# Patient Record
Sex: Female | Born: 1955
Health system: Southern US, Community
[De-identification: ages and names within clinical notes are randomized; demographics above are authoritative.]

## PROBLEM LIST (undated history)

## (undated) DIAGNOSIS — E669 Obesity, unspecified: Secondary | ICD-10-CM

## (undated) DIAGNOSIS — F32A Depression, unspecified: Secondary | ICD-10-CM

## (undated) DIAGNOSIS — F410 Panic disorder [episodic paroxysmal anxiety] without agoraphobia: Secondary | ICD-10-CM

## (undated) DIAGNOSIS — IMO0001 Reserved for inherently not codable concepts without codable children: Secondary | ICD-10-CM

## (undated) DIAGNOSIS — E119 Type 2 diabetes mellitus without complications: Secondary | ICD-10-CM

## (undated) DIAGNOSIS — F329 Major depressive disorder, single episode, unspecified: Secondary | ICD-10-CM

## (undated) DIAGNOSIS — F419 Anxiety disorder, unspecified: Secondary | ICD-10-CM

## (undated) DIAGNOSIS — K2970 Gastritis, unspecified, without bleeding: Secondary | ICD-10-CM

## (undated) DIAGNOSIS — I10 Essential (primary) hypertension: Secondary | ICD-10-CM

## (undated) DIAGNOSIS — N200 Calculus of kidney: Secondary | ICD-10-CM

## (undated) DIAGNOSIS — L739 Follicular disorder, unspecified: Secondary | ICD-10-CM

## (undated) DIAGNOSIS — K219 Gastro-esophageal reflux disease without esophagitis: Secondary | ICD-10-CM

## (undated) DIAGNOSIS — E782 Mixed hyperlipidemia: Secondary | ICD-10-CM

## (undated) DIAGNOSIS — K589 Irritable bowel syndrome without diarrhea: Secondary | ICD-10-CM

## (undated) DIAGNOSIS — N3946 Mixed incontinence: Secondary | ICD-10-CM

## (undated) DIAGNOSIS — B9681 Helicobacter pylori [H. pylori] as the cause of diseases classified elsewhere: Secondary | ICD-10-CM

## (undated) DIAGNOSIS — K635 Polyp of colon: Secondary | ICD-10-CM

## (undated) DIAGNOSIS — E039 Hypothyroidism, unspecified: Secondary | ICD-10-CM

## (undated) HISTORY — DX: Panic disorder (episodic paroxysmal anxiety): F41.0

## (undated) HISTORY — DX: Polyp of colon: K63.5

## (undated) HISTORY — DX: Mixed hyperlipidemia: E78.2

## (undated) HISTORY — DX: Essential (primary) hypertension: I10

## (undated) HISTORY — DX: Depression, unspecified: F32.A

## (undated) HISTORY — PX: UPPER GASTROINTESTINAL ENDOSCOPY: SHX188

## (undated) HISTORY — DX: Obesity, unspecified: E66.9

## (undated) HISTORY — DX: Mixed incontinence: N39.46

## (undated) HISTORY — DX: Reserved for inherently not codable concepts without codable children: IMO0001

## (undated) HISTORY — DX: Gastro-esophageal reflux disease without esophagitis: K21.9

## (undated) HISTORY — DX: Irritable bowel syndrome, unspecified: K58.9

## (undated) HISTORY — DX: Anxiety disorder, unspecified: F41.9

## (undated) HISTORY — DX: Hypothyroidism, unspecified: E03.9

## (undated) HISTORY — DX: Major depressive disorder, single episode, unspecified: F32.9

## (undated) HISTORY — PX: CHOLECYSTECTOMY: SHX55

## (undated) HISTORY — DX: Helicobacter pylori (H. pylori) as the cause of diseases classified elsewhere: B96.81

## (undated) HISTORY — DX: Calculus of kidney: N20.0

## (undated) HISTORY — DX: Follicular disorder, unspecified: L73.9

## (undated) HISTORY — DX: Gastritis, unspecified, without bleeding: K29.70

---

## 2000-11-03 ENCOUNTER — Encounter: Payer: Self-pay | Admitting: Internal Medicine

## 2000-11-03 ENCOUNTER — Emergency Department (HOSPITAL_COMMUNITY): Admission: EM | Admit: 2000-11-03 | Discharge: 2000-11-03 | Payer: Self-pay | Admitting: Internal Medicine

## 2001-01-30 ENCOUNTER — Emergency Department (HOSPITAL_COMMUNITY): Admission: EM | Admit: 2001-01-30 | Discharge: 2001-01-30 | Payer: Self-pay | Admitting: *Deleted

## 2001-06-04 ENCOUNTER — Ambulatory Visit (HOSPITAL_COMMUNITY): Admission: RE | Admit: 2001-06-04 | Discharge: 2001-06-04 | Payer: Self-pay | Admitting: Family Medicine

## 2001-06-04 ENCOUNTER — Encounter: Payer: Self-pay | Admitting: Family Medicine

## 2001-06-17 ENCOUNTER — Encounter: Payer: Self-pay | Admitting: Obstetrics and Gynecology

## 2001-06-17 ENCOUNTER — Ambulatory Visit (HOSPITAL_COMMUNITY): Admission: RE | Admit: 2001-06-17 | Discharge: 2001-06-17 | Payer: Self-pay | Admitting: Obstetrics and Gynecology

## 2001-08-07 ENCOUNTER — Ambulatory Visit (HOSPITAL_COMMUNITY): Admission: RE | Admit: 2001-08-07 | Discharge: 2001-08-07 | Payer: Self-pay | Admitting: Neurosurgery

## 2001-08-07 ENCOUNTER — Encounter: Payer: Self-pay | Admitting: Neurosurgery

## 2001-09-25 ENCOUNTER — Encounter: Payer: Self-pay | Admitting: *Deleted

## 2001-09-25 ENCOUNTER — Emergency Department (HOSPITAL_COMMUNITY): Admission: EM | Admit: 2001-09-25 | Discharge: 2001-09-25 | Payer: Self-pay | Admitting: *Deleted

## 2001-10-17 ENCOUNTER — Ambulatory Visit (HOSPITAL_COMMUNITY): Admission: RE | Admit: 2001-10-17 | Discharge: 2001-10-17 | Payer: Self-pay | Admitting: Cardiology

## 2001-11-28 ENCOUNTER — Emergency Department (HOSPITAL_COMMUNITY): Admission: EM | Admit: 2001-11-28 | Discharge: 2001-11-28 | Payer: Self-pay | Admitting: Emergency Medicine

## 2001-11-28 ENCOUNTER — Encounter: Payer: Self-pay | Admitting: Emergency Medicine

## 2001-12-22 ENCOUNTER — Ambulatory Visit (HOSPITAL_COMMUNITY): Admission: RE | Admit: 2001-12-22 | Discharge: 2001-12-22 | Payer: Self-pay | Admitting: Obstetrics and Gynecology

## 2001-12-22 ENCOUNTER — Encounter: Payer: Self-pay | Admitting: Obstetrics and Gynecology

## 2002-04-05 ENCOUNTER — Emergency Department (HOSPITAL_COMMUNITY): Admission: EM | Admit: 2002-04-05 | Discharge: 2002-04-05 | Payer: Self-pay | Admitting: Emergency Medicine

## 2002-04-05 ENCOUNTER — Encounter: Payer: Self-pay | Admitting: Emergency Medicine

## 2002-05-13 ENCOUNTER — Encounter: Payer: Self-pay | Admitting: Family Medicine

## 2002-05-13 ENCOUNTER — Ambulatory Visit (HOSPITAL_COMMUNITY): Admission: RE | Admit: 2002-05-13 | Discharge: 2002-05-13 | Payer: Self-pay | Admitting: Family Medicine

## 2002-07-06 ENCOUNTER — Encounter: Payer: Self-pay | Admitting: Family Medicine

## 2002-07-06 ENCOUNTER — Ambulatory Visit (HOSPITAL_COMMUNITY): Admission: RE | Admit: 2002-07-06 | Discharge: 2002-07-06 | Payer: Self-pay | Admitting: Family Medicine

## 2002-11-26 ENCOUNTER — Ambulatory Visit (HOSPITAL_COMMUNITY): Admission: RE | Admit: 2002-11-26 | Discharge: 2002-11-26 | Payer: Self-pay | Admitting: Family Medicine

## 2002-11-26 ENCOUNTER — Encounter: Payer: Self-pay | Admitting: Family Medicine

## 2002-12-01 ENCOUNTER — Emergency Department (HOSPITAL_COMMUNITY): Admission: EM | Admit: 2002-12-01 | Discharge: 2002-12-01 | Payer: Self-pay | Admitting: *Deleted

## 2002-12-01 ENCOUNTER — Encounter: Payer: Self-pay | Admitting: *Deleted

## 2003-02-10 ENCOUNTER — Emergency Department (HOSPITAL_COMMUNITY): Admission: EM | Admit: 2003-02-10 | Discharge: 2003-02-10 | Payer: Self-pay | Admitting: Emergency Medicine

## 2003-02-11 ENCOUNTER — Ambulatory Visit (HOSPITAL_COMMUNITY): Admission: RE | Admit: 2003-02-11 | Discharge: 2003-02-11 | Payer: Self-pay | Admitting: Internal Medicine

## 2003-02-11 IMAGING — US US EXTREM LOW VENOUS*L*
1 series · 14 of 24 positions shown · non-contrast
Comparison: none

CLINICAL DATA: Pain for one week.
 LEFT LOWER EXTREMITY ULTRASOUND VENOUS IMAGING
 The deep venous system is patent and compressible from the groin through popliteal fossa.  Spontaneous venous flow is present with intact augmentation.  No intraluminal thrombus is seen.  
 IMPRESSION
 No evidence of deep venous thrombosis.

[Series 1: unknown · 14 of 25 slices shown]
[im 1/25]
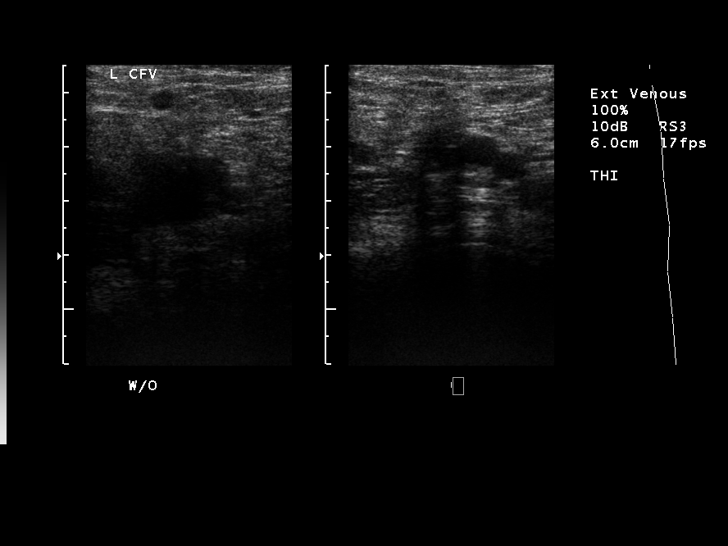
[im 3/25]
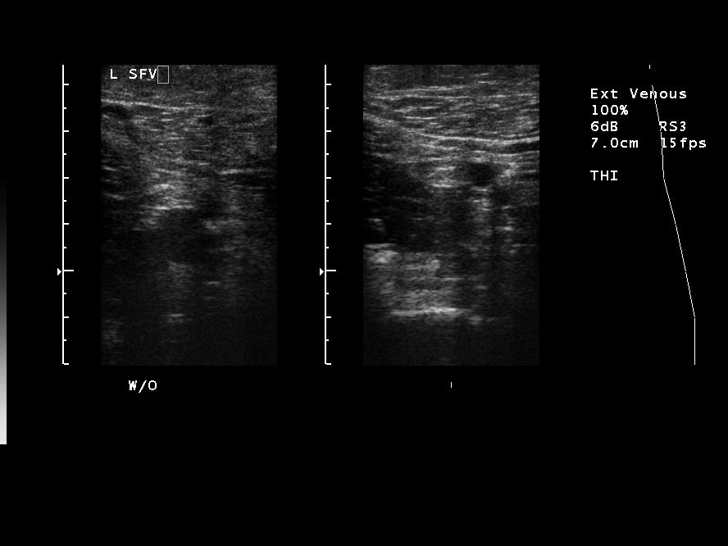
[im 5/25]
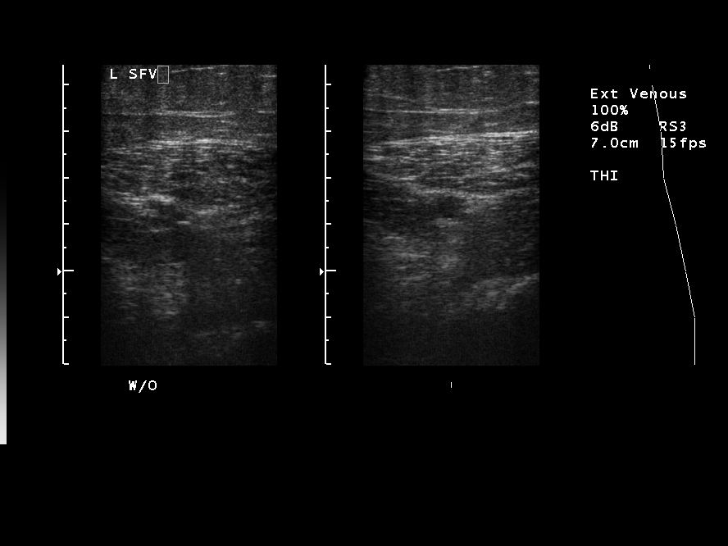
[im 7/25]
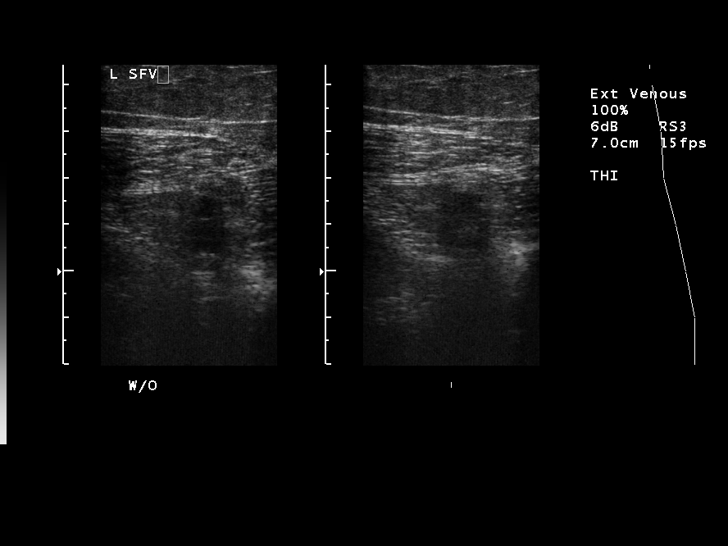
[im 8/25]
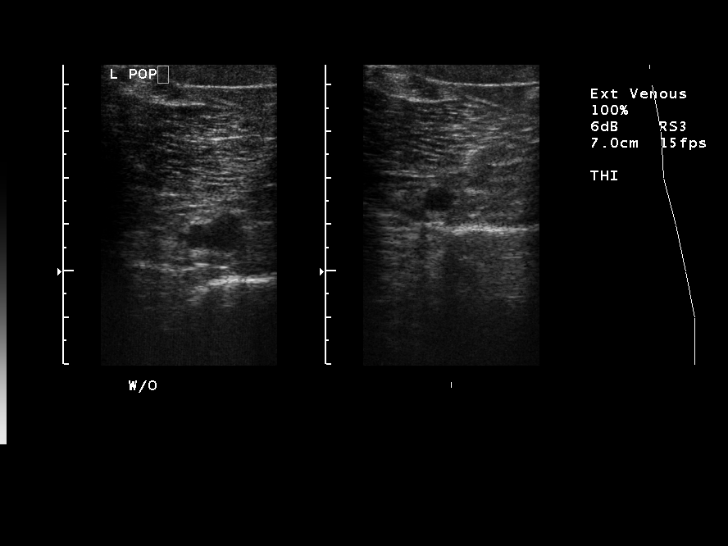
[im 10/25]
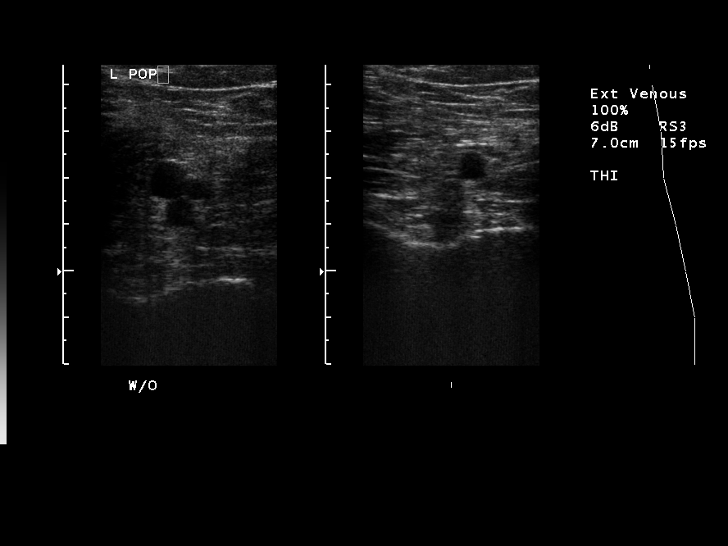
[im 12/25]
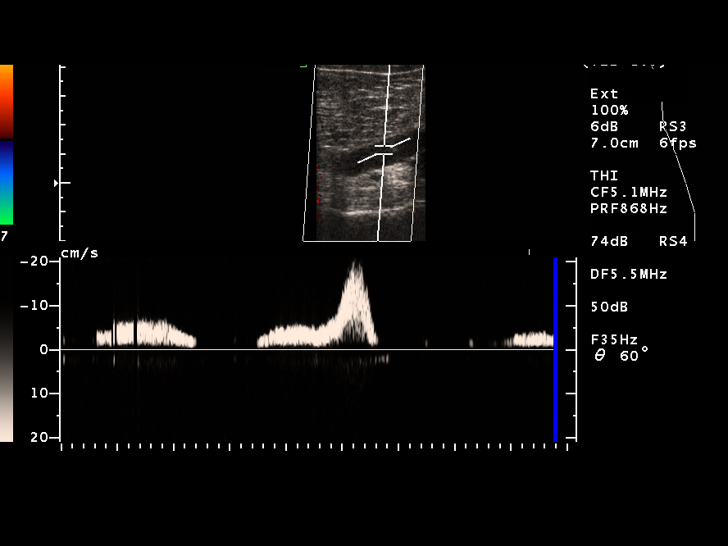
[im 13/25]
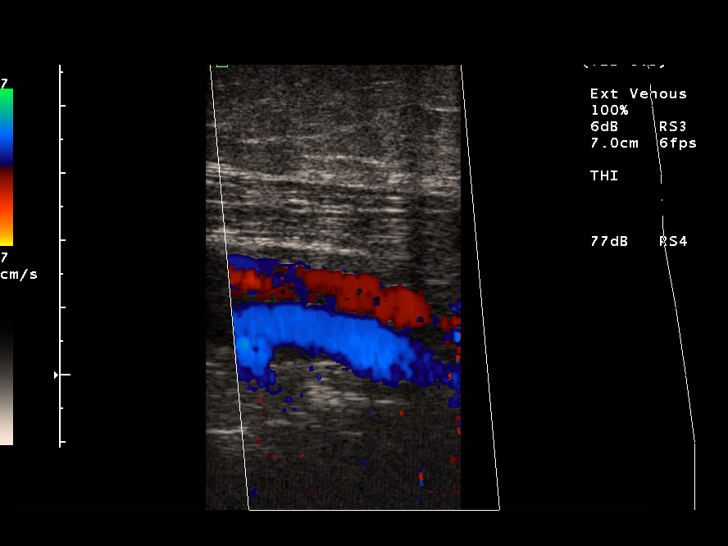
[im 15/25]
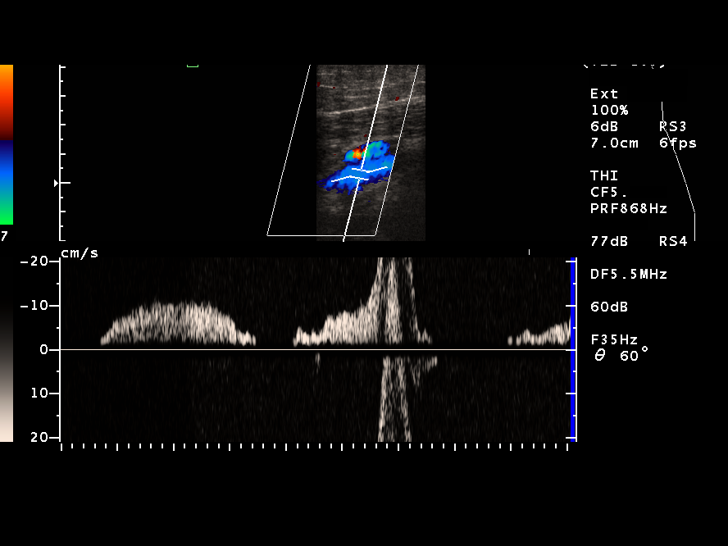
[im 17/25]
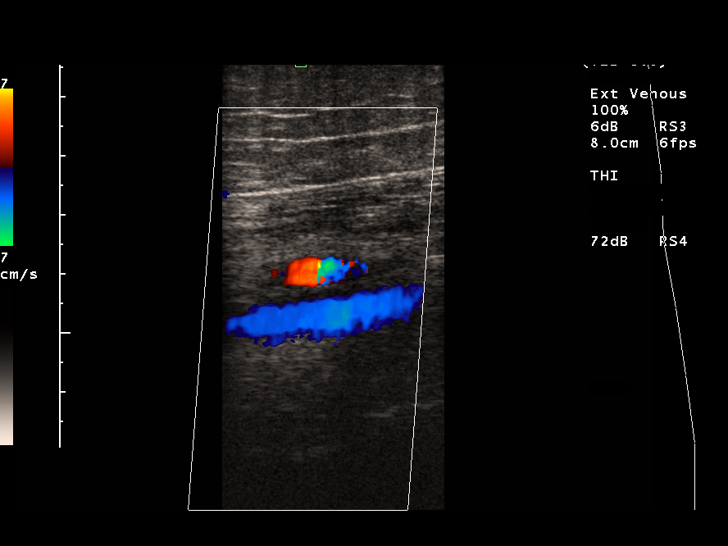
[im 19/25]
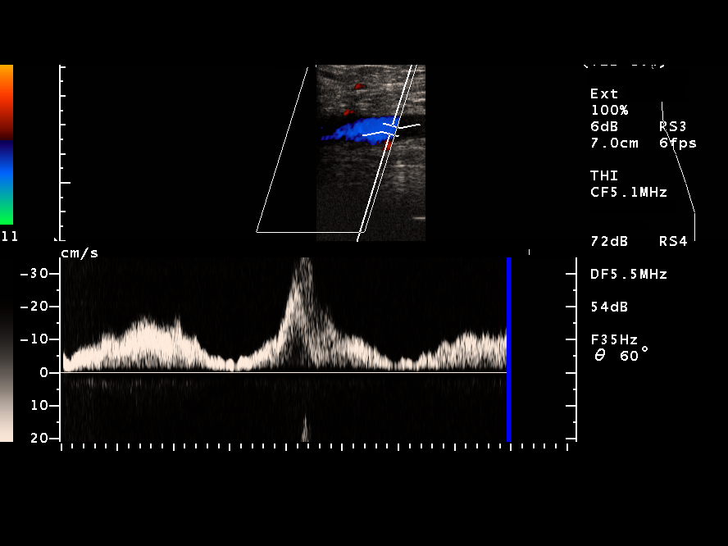
[im 20/25]
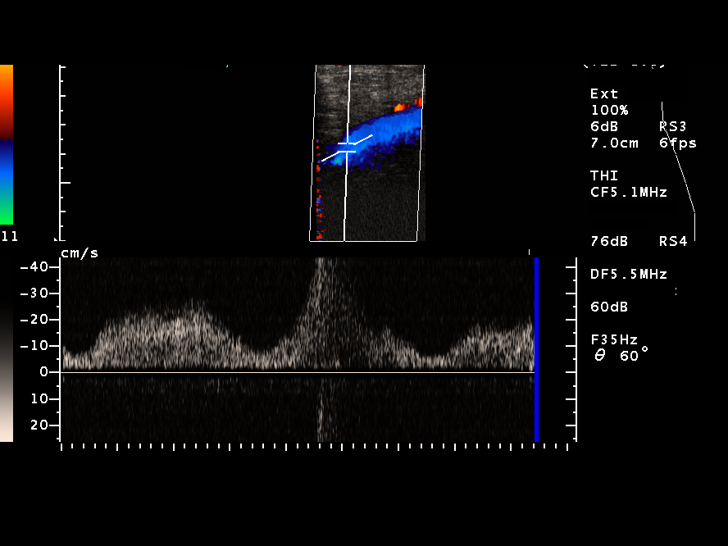
[im 22/25]
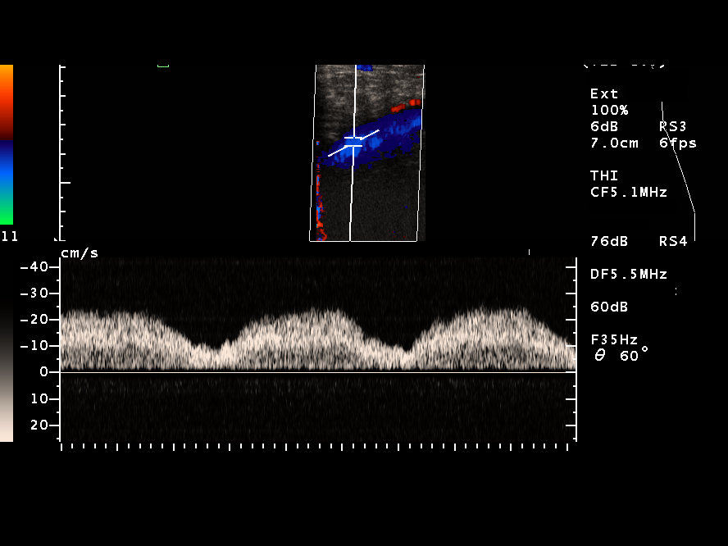
[im 25/25]
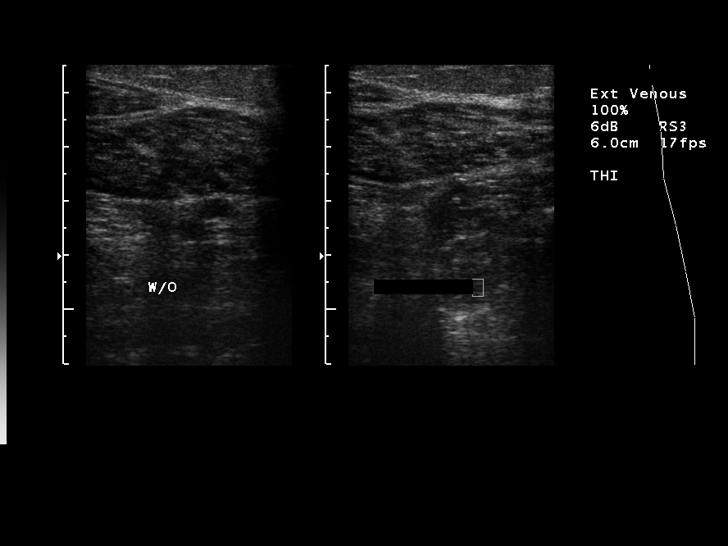

[14 of 24 positions shown; findings below may reference images not displayed]

## 2003-05-11 ENCOUNTER — Emergency Department (HOSPITAL_COMMUNITY): Admission: EM | Admit: 2003-05-11 | Discharge: 2003-05-11 | Payer: Self-pay | Admitting: Emergency Medicine

## 2003-05-20 ENCOUNTER — Ambulatory Visit (HOSPITAL_COMMUNITY): Admission: RE | Admit: 2003-05-20 | Discharge: 2003-05-20 | Payer: Self-pay | Admitting: Otolaryngology

## 2003-05-20 IMAGING — CT CT PARANASAL SINUSES LIMITED
1 series · 6 of 8 positions shown, 8 images · non-contrast
Comparison: none

CLINICAL DATA: Chronic sinusitis.
 CT SINUS LTD W/O CONTRAST
 Supine axial images were made through the paranasal sinuses.  There is no mucosal thickening or air-fluid level.  There is very slight nasal septal deviation to the right.
 IMPRESSION
 Normal paranasal sinuses.

[Series 8297: — · axial · 0.37mm/px · z∈[-672,-622]mm · 6 of 8 slices shown, 8 images]
[im 2/8  brain]
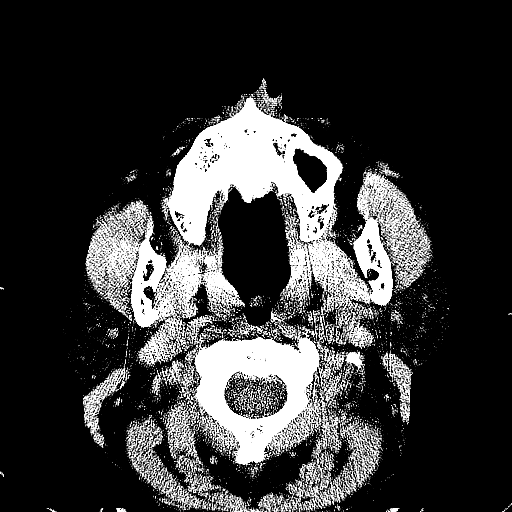
[im 2/8  bone]
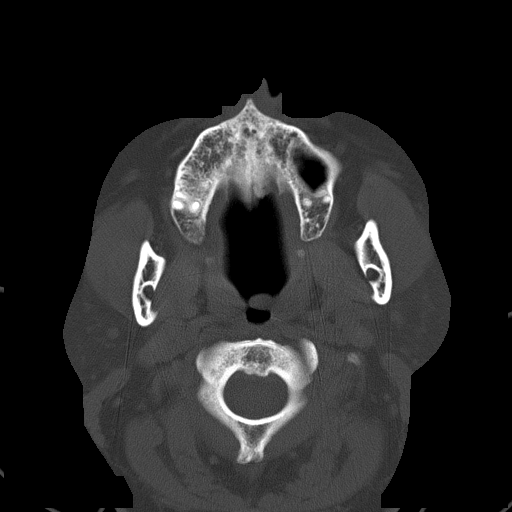
[im 3/8  bone]
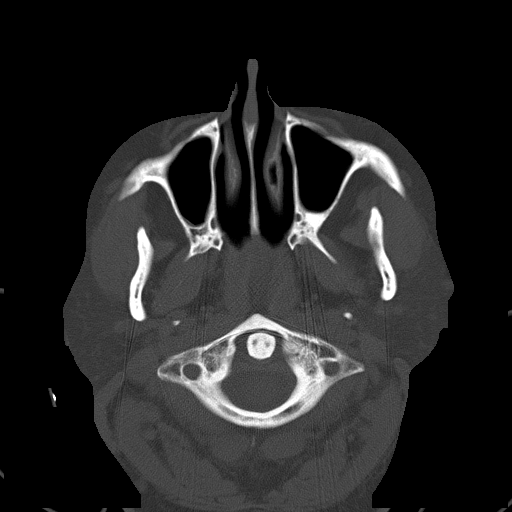
[im 4/8  bone]
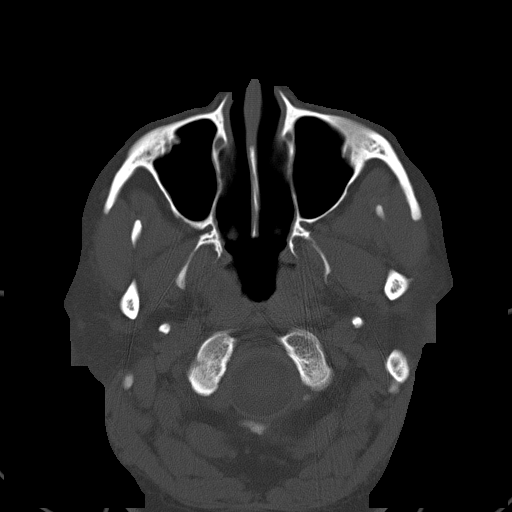
[im 5/8  bone]
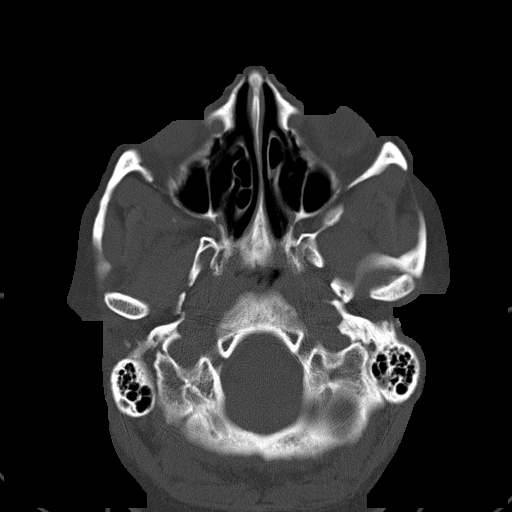
[im 6/8  brain]
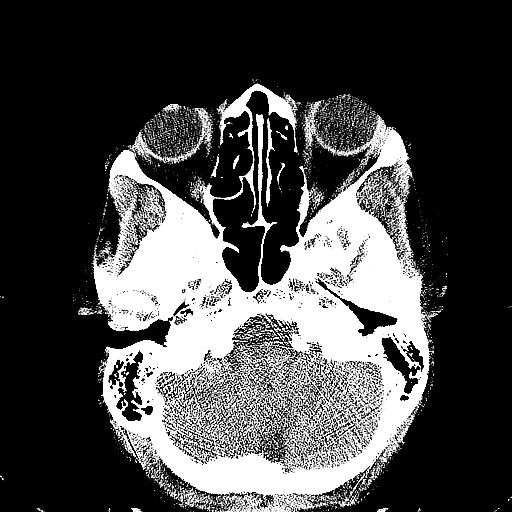
[im 6/8  bone]
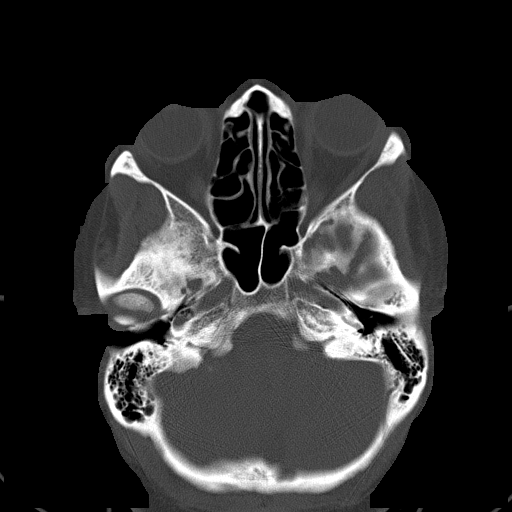
[im 7/8  bone]
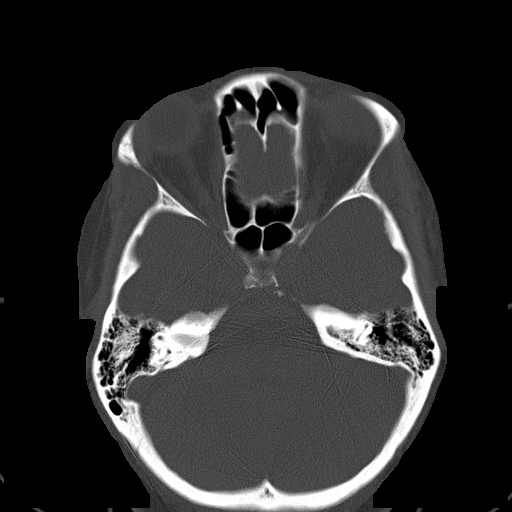

[6 of 8 positions shown; findings below may reference images not displayed]

## 2003-07-29 ENCOUNTER — Ambulatory Visit (HOSPITAL_COMMUNITY): Admission: RE | Admit: 2003-07-29 | Discharge: 2003-07-29 | Payer: Self-pay | Admitting: Obstetrics and Gynecology

## 2004-02-05 ENCOUNTER — Ambulatory Visit (HOSPITAL_COMMUNITY): Admission: RE | Admit: 2004-02-05 | Discharge: 2004-02-05 | Payer: Self-pay | Admitting: Family Medicine

## 2004-05-13 ENCOUNTER — Emergency Department (HOSPITAL_COMMUNITY): Admission: EM | Admit: 2004-05-13 | Discharge: 2004-05-13 | Payer: Self-pay | Admitting: *Deleted

## 2004-05-13 IMAGING — CR DG CHEST 2V
2 series · 2 of 2 positions shown · non-contrast
Comparison: none

CLINICAL DATA: Cough, congestion, and dyspnea.
 2-VIEW CHEST: 
 Peribronchial thickening is noted with no focal air space disease.  No pleural effusion or pneumothorax.  Upper limits normal heart size is noted. The bony thorax and upper abdomen are unremarkable except for evidence of cholecystectomy.

[view not recorded (1 of 2)]
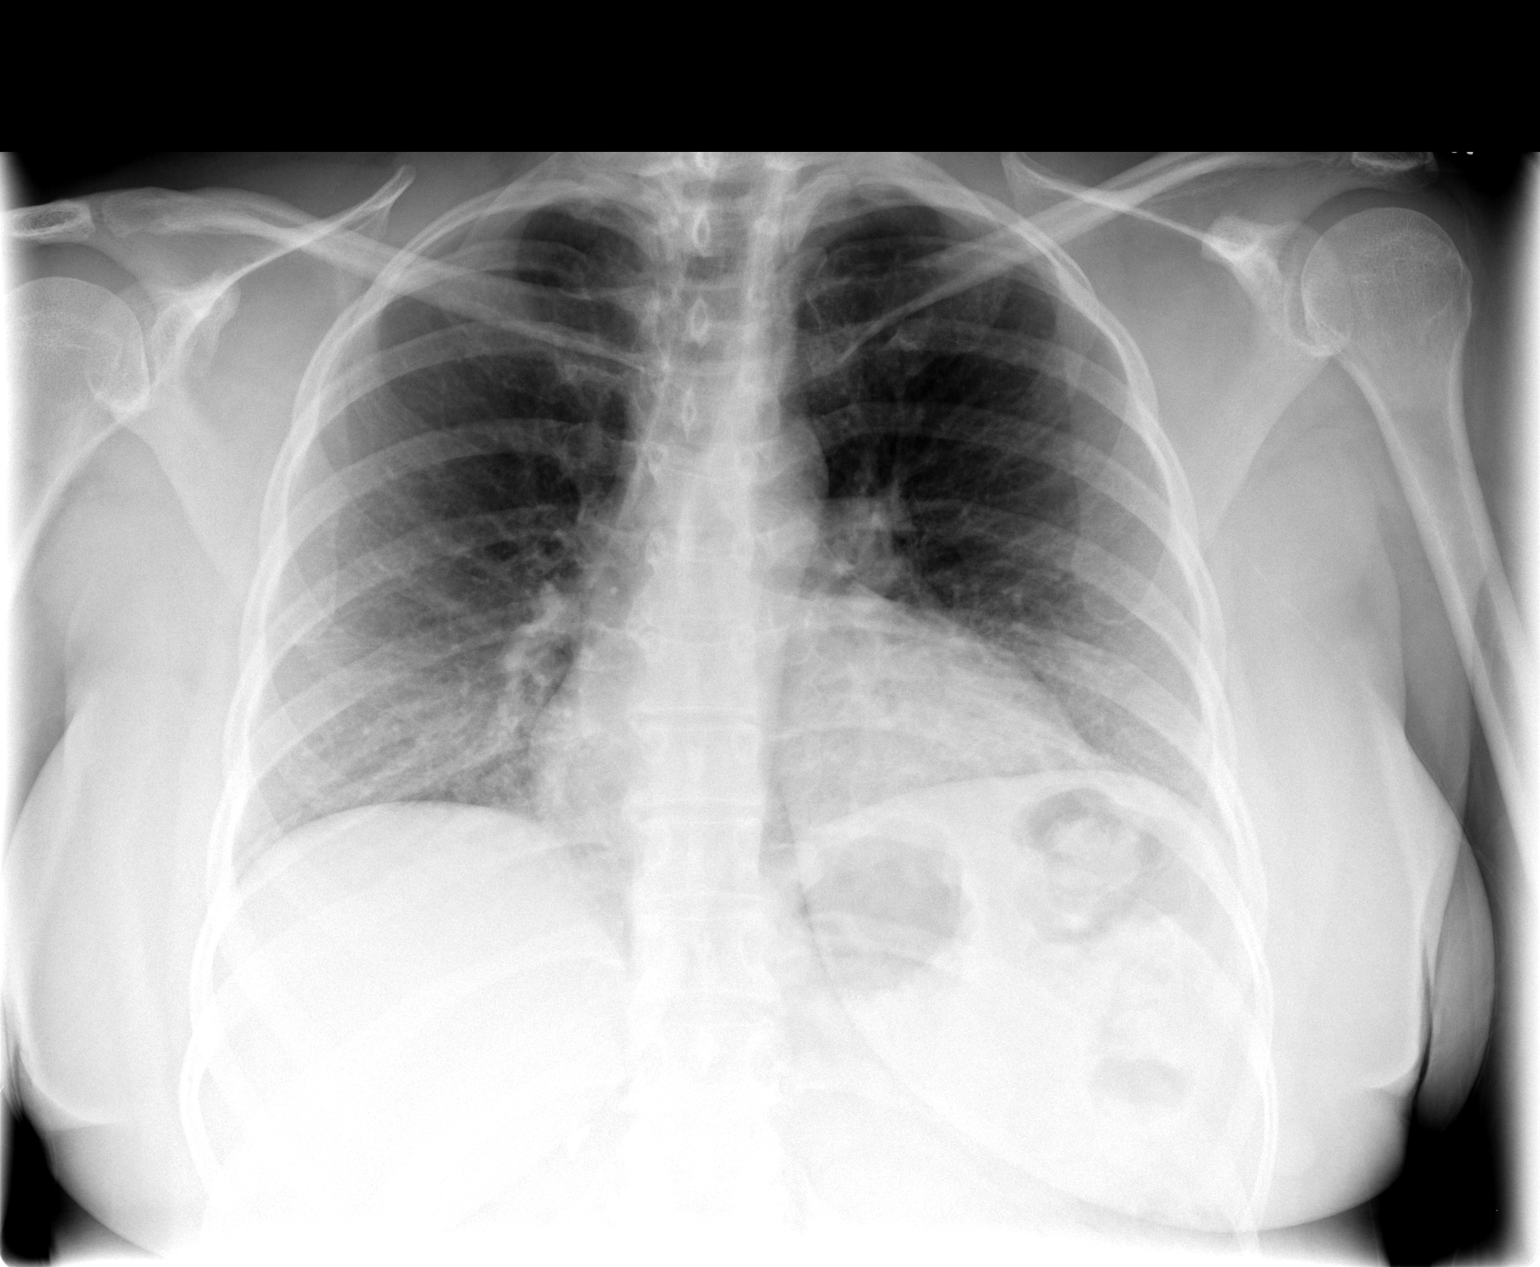

[view not recorded (2 of 2)]
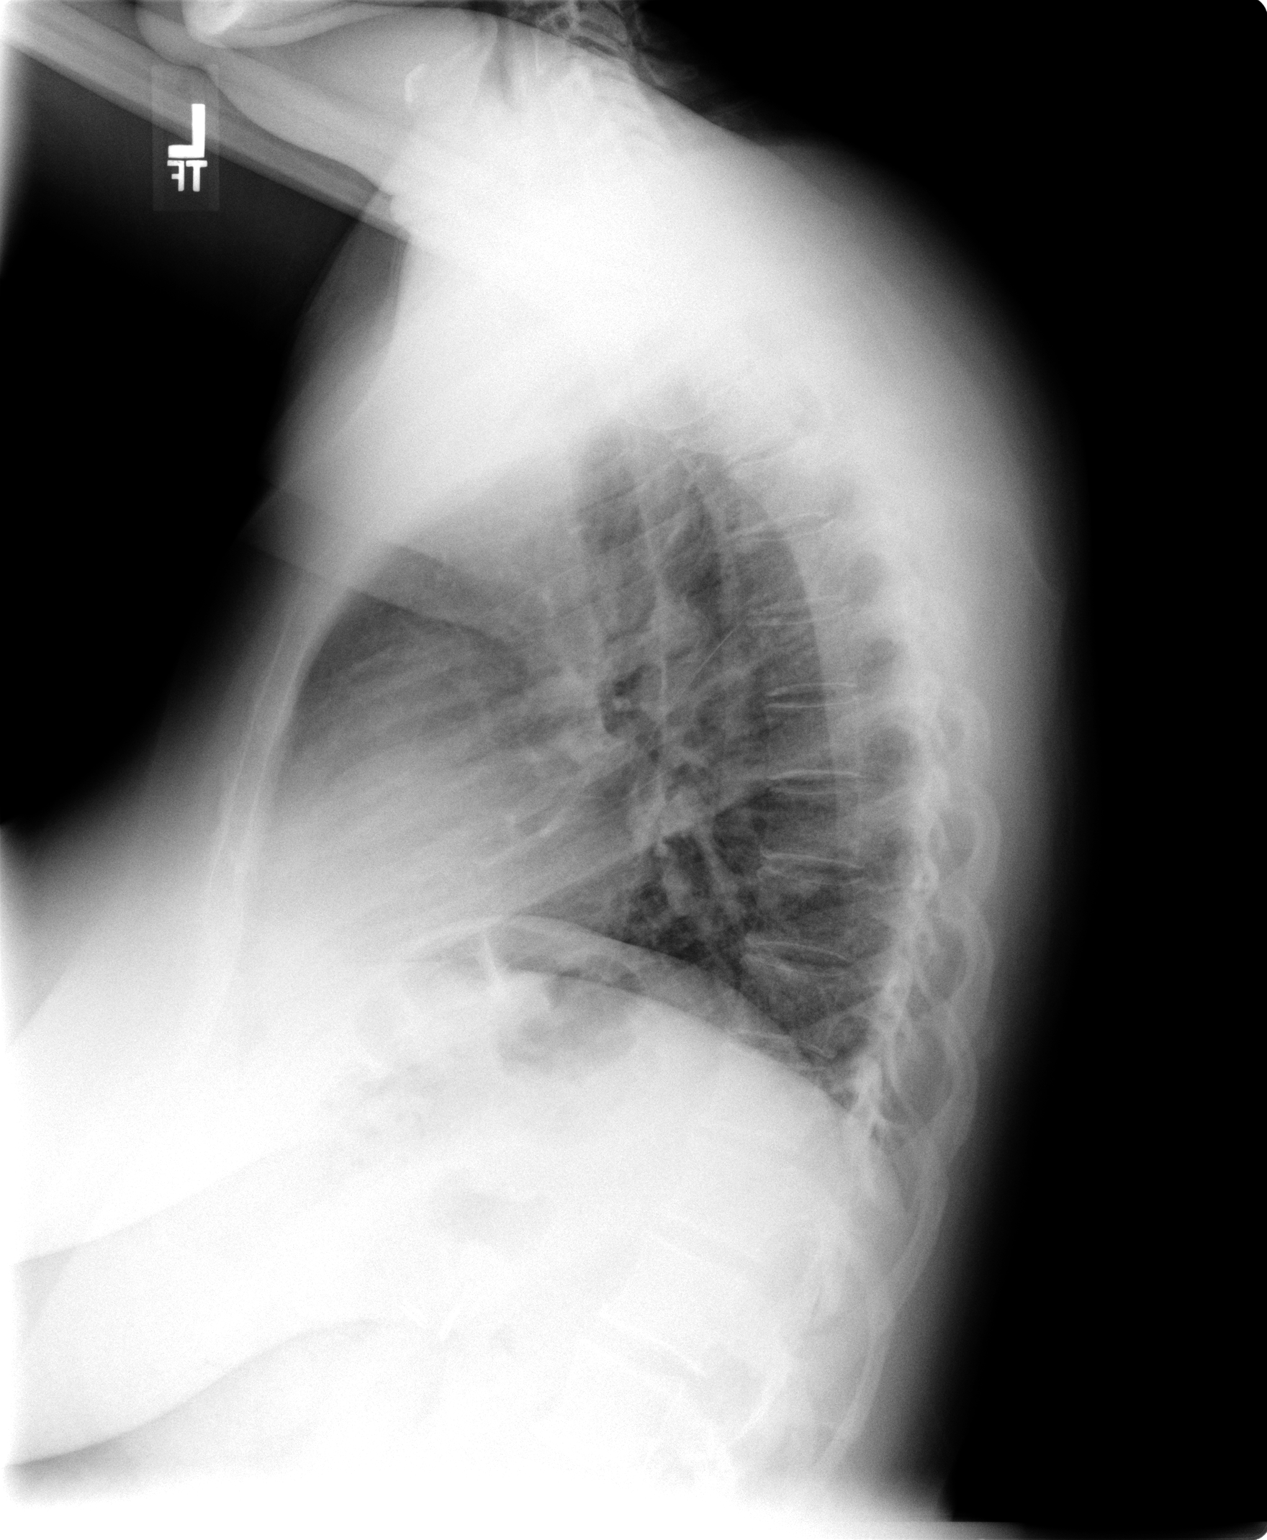

[2 of 2 positions shown; findings below may reference images not displayed]

IMPRESSION: Peribronchial thickening without focal air space disease.

## 2004-09-15 ENCOUNTER — Ambulatory Visit (HOSPITAL_COMMUNITY): Admission: RE | Admit: 2004-09-15 | Discharge: 2004-09-15 | Payer: Self-pay | Admitting: Obstetrics and Gynecology

## 2005-03-13 ENCOUNTER — Emergency Department (HOSPITAL_COMMUNITY): Admission: EM | Admit: 2005-03-13 | Discharge: 2005-03-13 | Payer: Self-pay | Admitting: Emergency Medicine

## 2005-03-13 IMAGING — CT CT ABDOMEN W/ CM
1 series · 14 of 32 positions shown, 18 images · IV contrast (omnipaque)
Comparison: none

CLINICAL DATA: Nausea and left lower quadrant pain for three weeks.  History of previous cholecystectomy.
 ABDOMEN CT WITH CONTRAST ? [DATE]:
TECHNIQUE: Multidetector CT imaging of the abdomen was performed following the standard protocol during bolus administration of intravenous contrast. 
 Contrast:  100 ml Omnipaque 300 IV.
TECHNIQUE: Multidetector CT imaging of the pelvis was performed following the standard protocol during bolus administration of intravenous contrast.

[Series 112: — · axial · 0.80mm/px · z∈[+1170,+1414]mm · 14 of 55 slices shown, 18 images]
[im 4/55  soft-tissue]
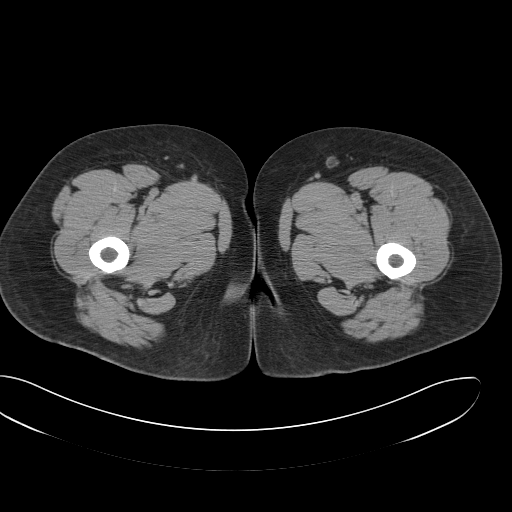
[im 4/55  bone]
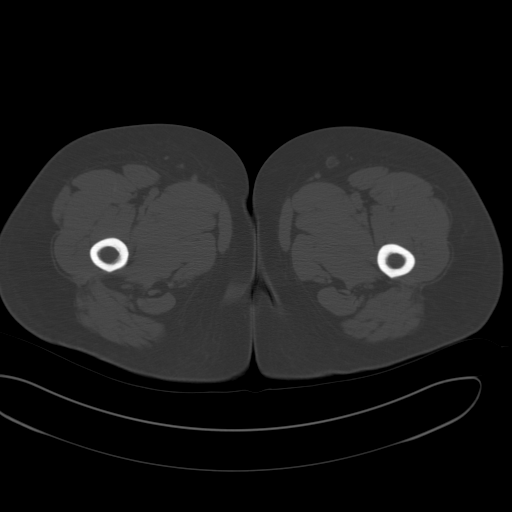
[im 7/55  soft-tissue]
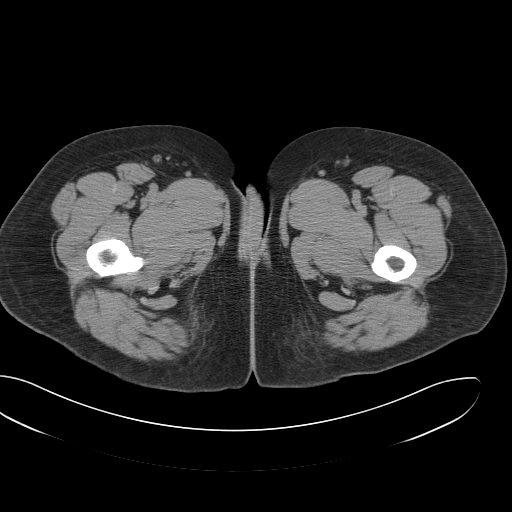
[im 13/55  soft-tissue]
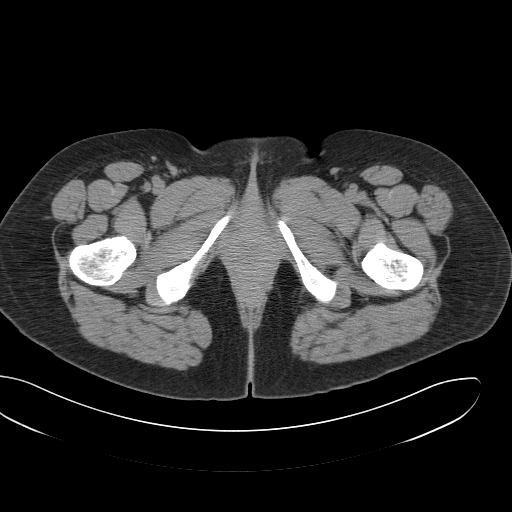
[im 16/55  soft-tissue]
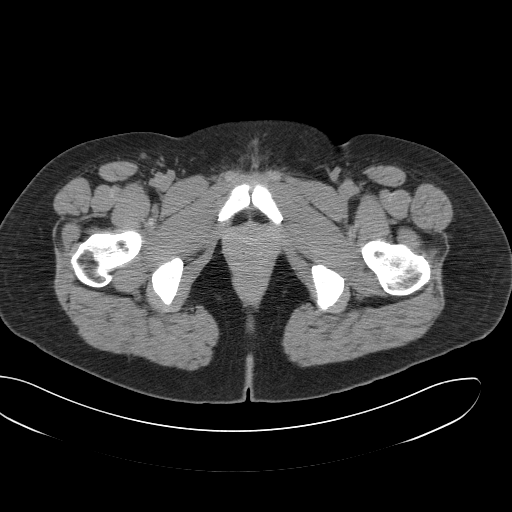
[im 21/55  soft-tissue]
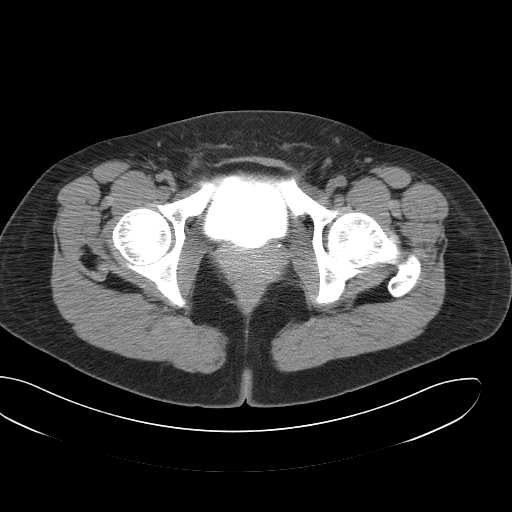
[im 25/55  soft-tissue]
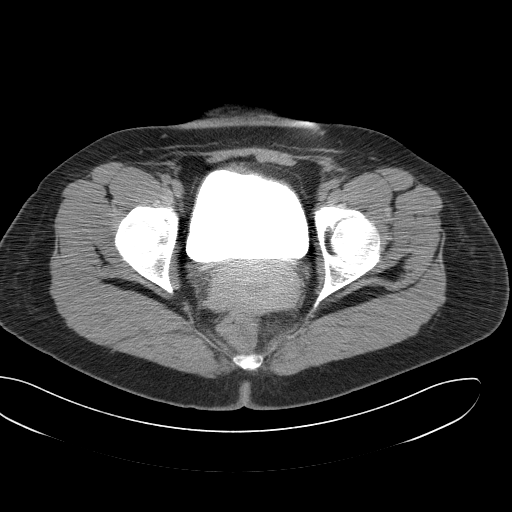
[im 30/55  soft-tissue]
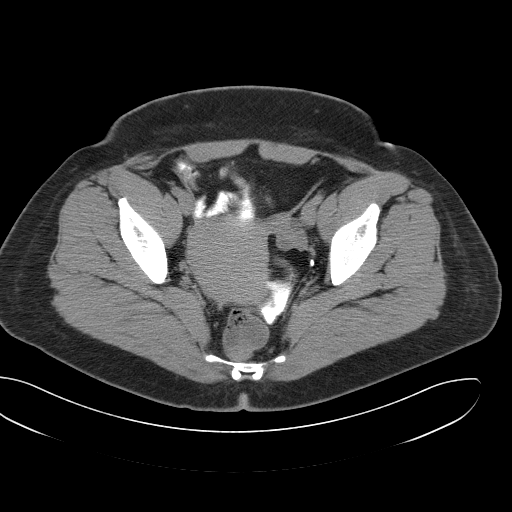
[im 34/55  soft-tissue]
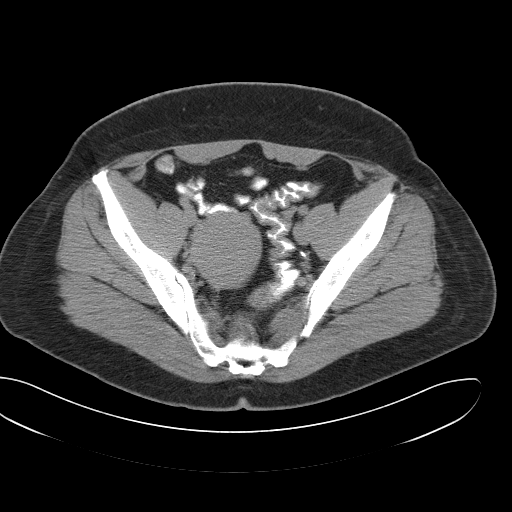
[im 39/55  soft-tissue]
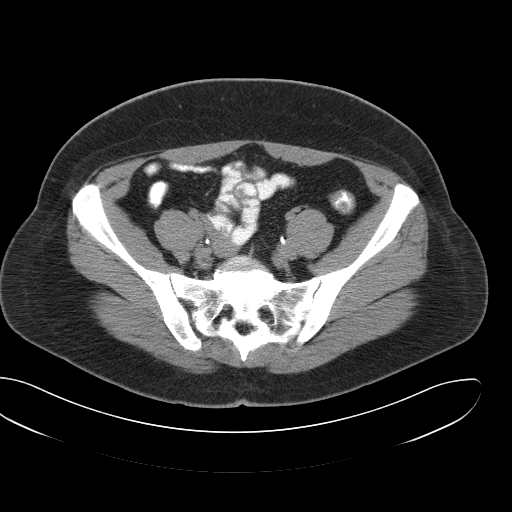
[im 39/55  bone]
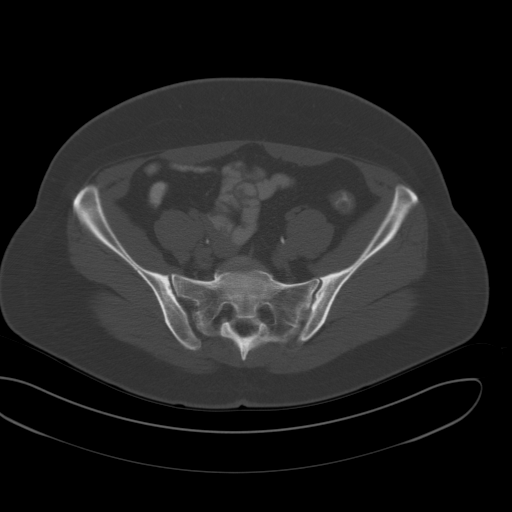
[im 42/55  soft-tissue]
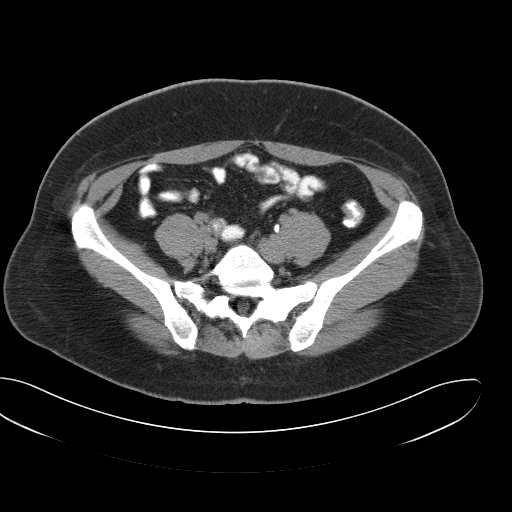
[im 48/55  soft-tissue]
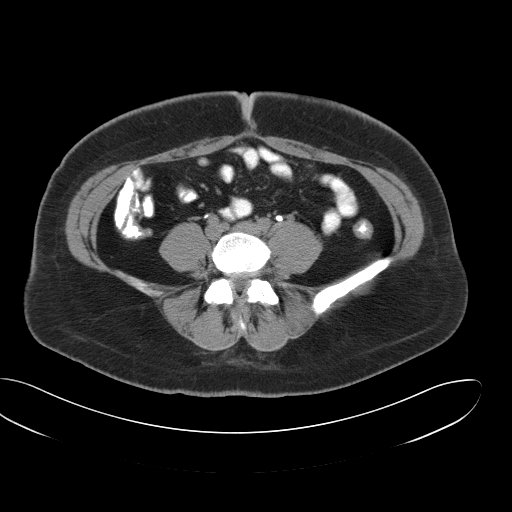
[im 48/55  lung]
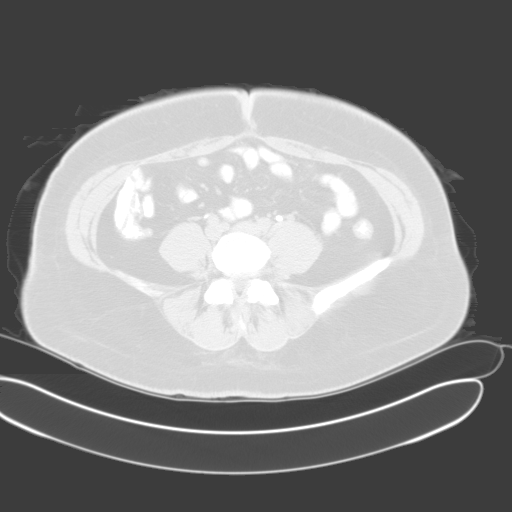
[im 49/55  lung]
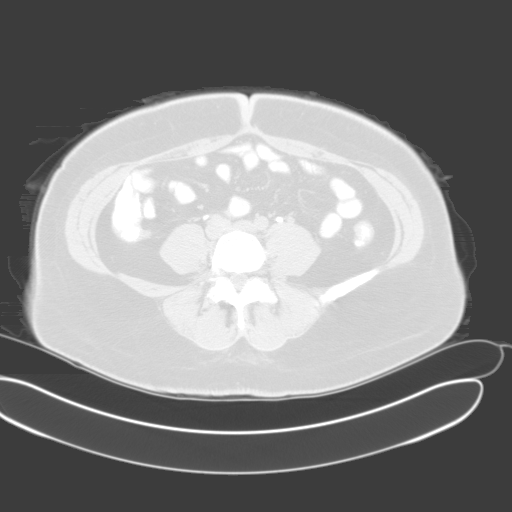
[im 51/55  soft-tissue]
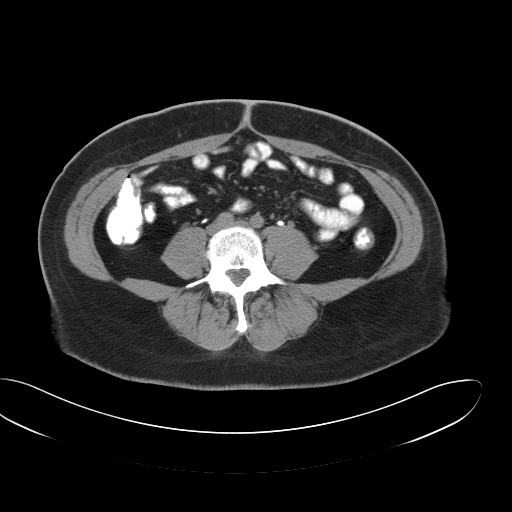
[im 51/55  lung]
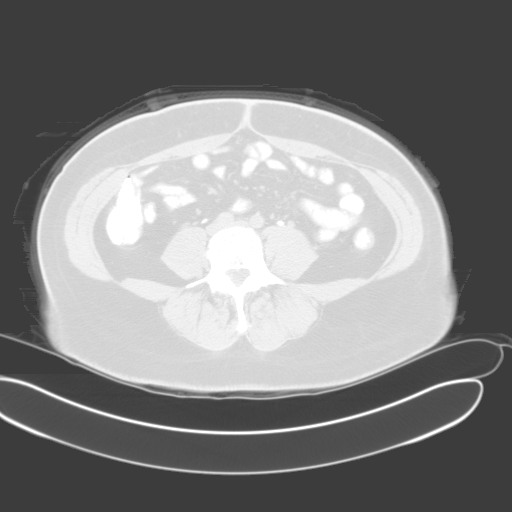
[im 53/55  lung]
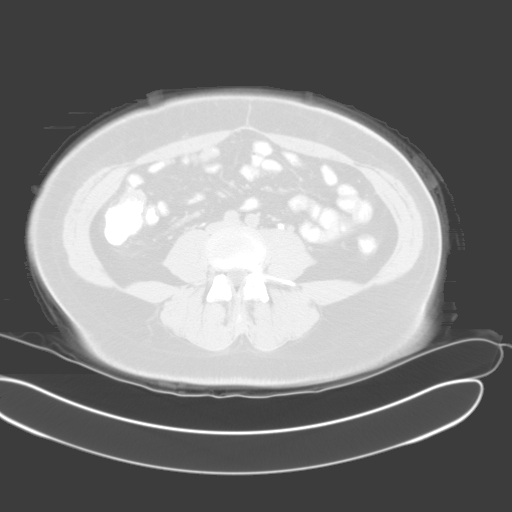

[14 of 32 positions shown; findings below may reference images not displayed]

FINDINGS: After the intravenous injection of 100 milliliters of Omnipaque 300 a series of scans of the abdomen are made without previous films for comparison and shows a moderate sized hiatal hernia.   The lower lung fields are normal.  The gallbladder has been removed.  The liver, hepatic ducts, common bile duct, pancreas, and spleen are normal.  The adrenal glands are normal.  There appears to be a 2 x 3 mm nonobstructing calculus associated with the midpole of the right kidney.  There is no hydronephrosis or hydroureter associated with either kidney.  
 There are two small cysts associated with the upper pole of the right kidney, both of which measure less than 8 mm in diameter.  There are no abnormal lymph nodes.  The oral contrast has passed through the stomach and entire small bowel into the right colon.  The appendix appears normal and there is no evidence of free fluid or inflammation in the abdomen.  The major vessels are normal with minimal calcification of the aorta.  The bones of the lower thoracic and upper lumbar spine are within the limits of normal.
IMPRESSION: Hiatal hernia.  
 Simple cysts of upper pole of the right kidney.  
 Previous cholecystectomy.
 No active disease in the upper abdomen.  
 There is a 2 x 3 mm nonobstructing stone in the lower pole of the right kidney.
 PELVIS CT WITH CONTRAST ? [DATE]:
FINDINGS: CT scan of the pelvis shows no evidence of free fluid or air.  The uterus is slightly prominent and extends slightly to the right of the midline.  The left ovary is visualized and measures 2.5 x 3.4 cm and is within the normal limit.  The ureters show no definite dilatation but there is a 1 x 3 mm calcification seen near the left ureterovesical junction that could be just at the orifice of the ureter into the bladder.  There are also some small pelvic phleboliths present which makes this differentiation difficult.  We will attempt to recall the patient to get delayed films of the pelvis in order to better visualize the relationship of the distal ureter to this calcification which is best seen on image #89/103.  The bones of the pelvis and lower lumbar spine show no significant abnormality.
IMPRESSION: Questionable 1 x 3 mm nonobstructing calculus in the left ureterovesical junction area.

## 2005-03-13 IMAGING — CR DG CHEST 1V PORT
1 series · 1 of 1 positions shown · non-contrast
Comparison: none

CLINICAL DATA: Chest pain and tightness

Portable chest at [C6]:
Comparison [DATE]. Relatively low volumes. No focal infiltrate or overt
edema. Heart size appears mildly enlarged, probably emphasized by technique and
low volumes. No effusion. Visualized bones unremarkable.

[view not recorded]
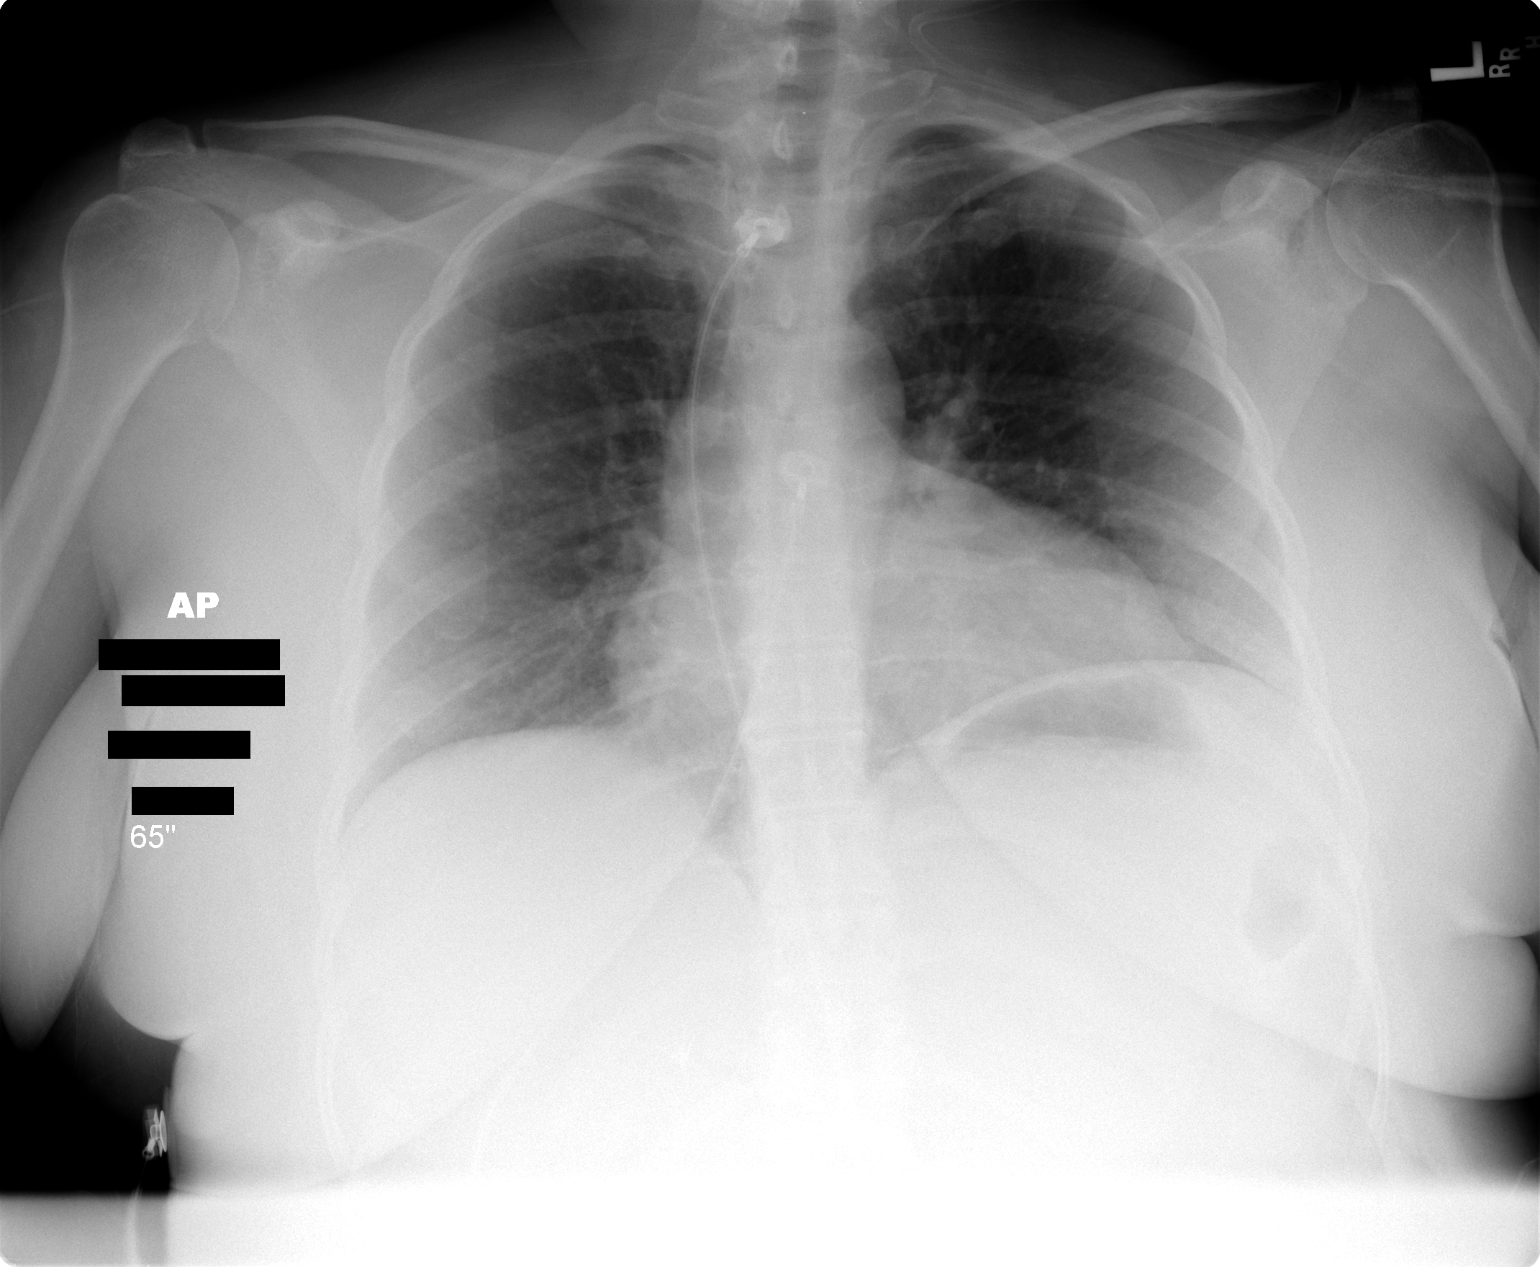

[1 of 1 positions shown; findings below may reference images not displayed]

IMPRESSION: 1. No acute disease

## 2005-03-13 IMAGING — CT CT ABDOMEN W/ CM
1 of 3 series · 13 of 32 positions shown, 18 images · IV contrast (CONTRAST)
Comparison: none

CLINICAL DATA: Nausea and left lower quadrant pain for three weeks.  History of previous cholecystectomy.
 ABDOMEN CT WITH CONTRAST ? [DATE]:
TECHNIQUE: Multidetector CT imaging of the abdomen was performed following the standard protocol during bolus administration of intravenous contrast. 
 Contrast:  100 ml Omnipaque 300 IV.
TECHNIQUE: Multidetector CT imaging of the pelvis was performed following the standard protocol during bolus administration of intravenous contrast.

[Series 102: — · axial · 0.76mm/px · z∈[+1276,+1726]mm · 13 of 103 slices shown, 18 images]
[im 7/103  soft-tissue]
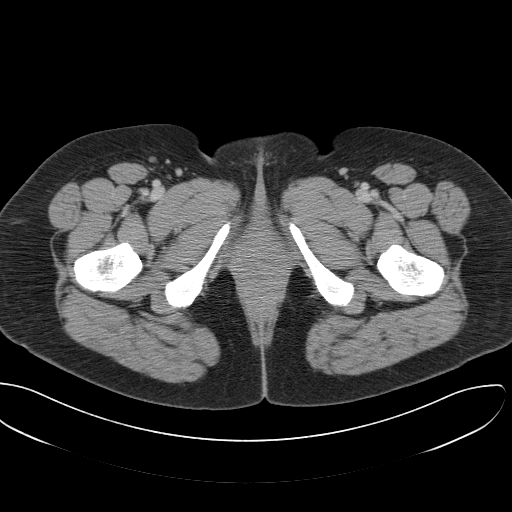
[im 7/103  bone]
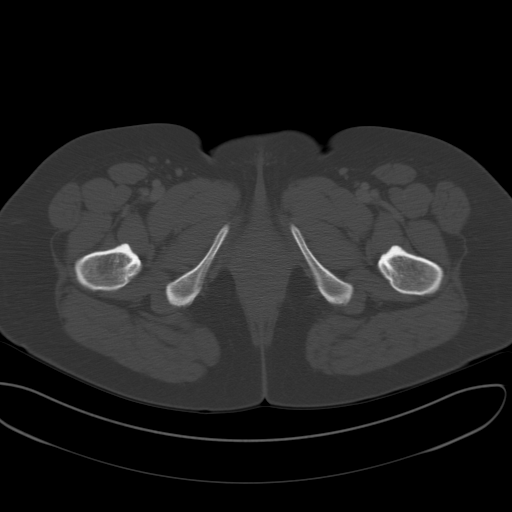
[im 19/103  soft-tissue]
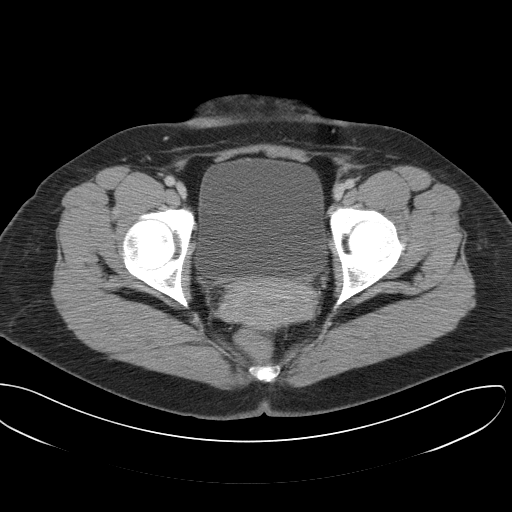
[im 25/103  soft-tissue]
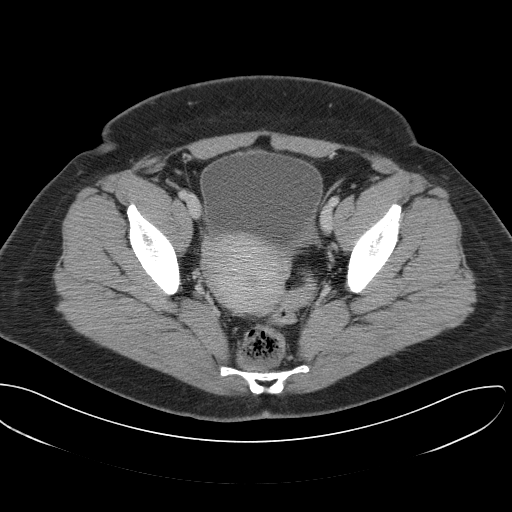
[im 31/103  soft-tissue]
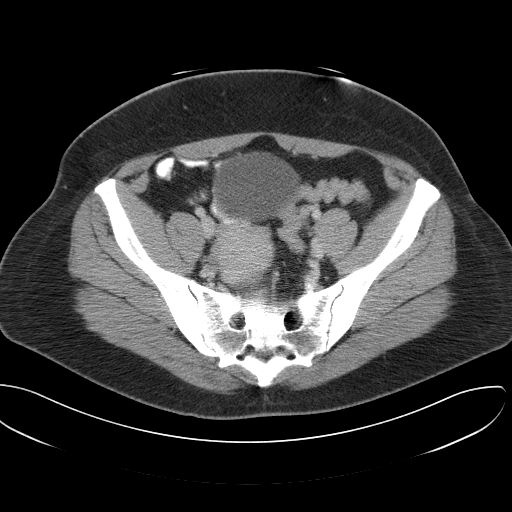
[im 43/103  soft-tissue]
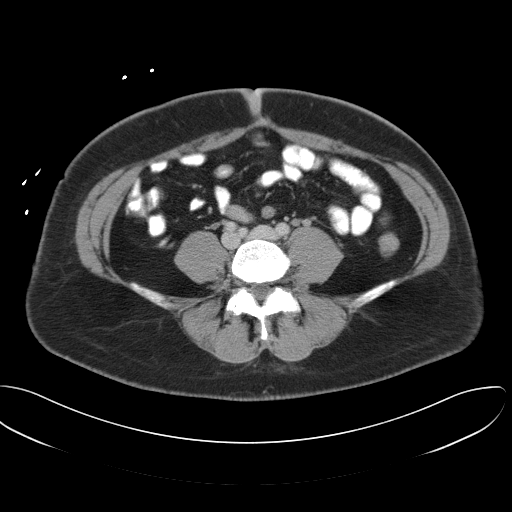
[im 49/103  soft-tissue]
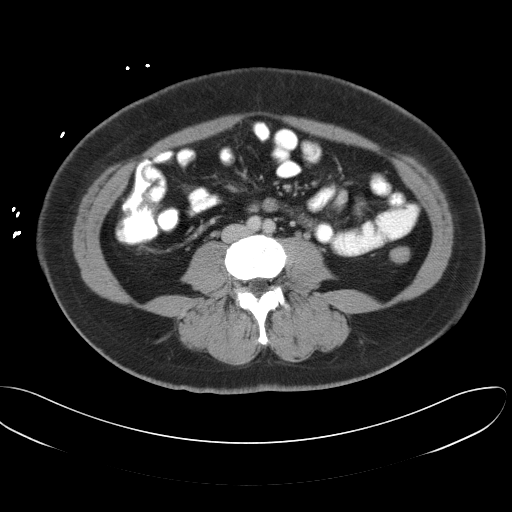
[im 55/103  soft-tissue]
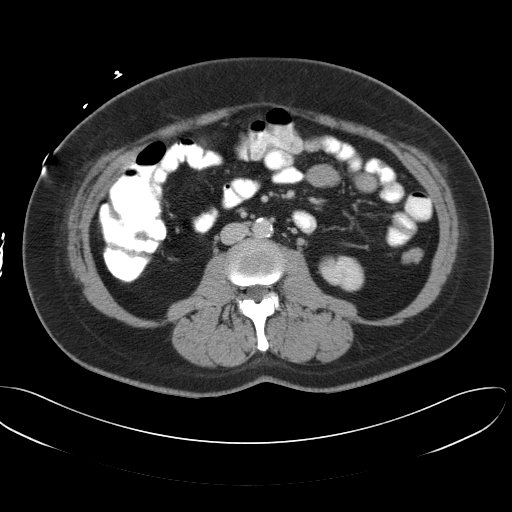
[im 67/103  soft-tissue]
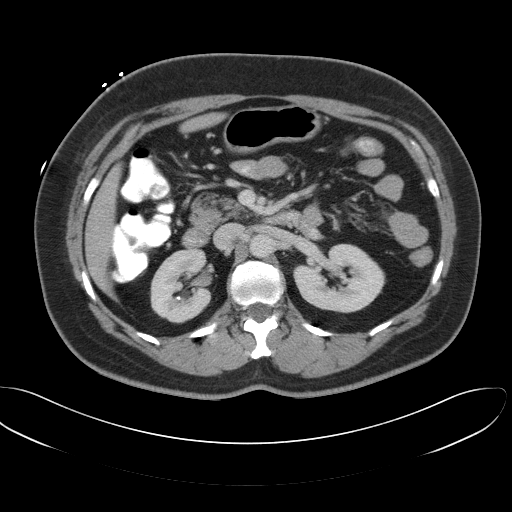
[im 73/103  soft-tissue]
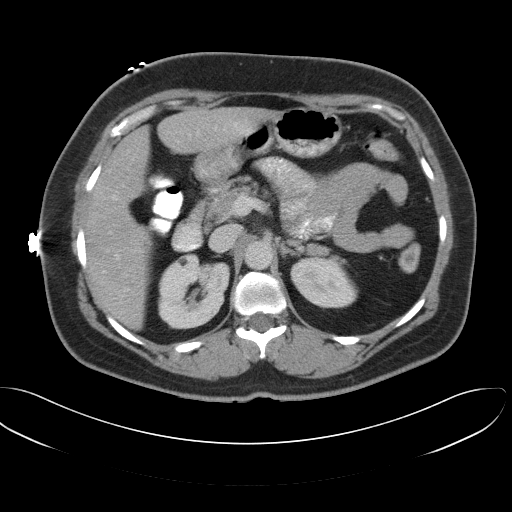
[im 73/103  bone]
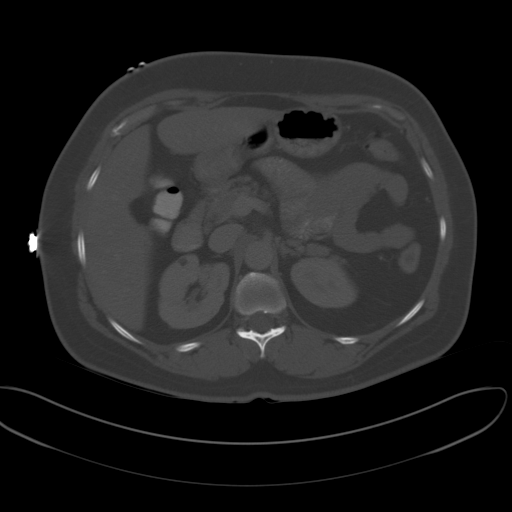
[im 79/103  soft-tissue]
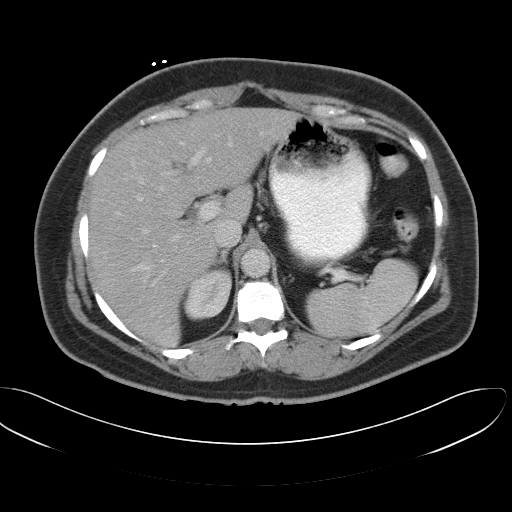
[im 79/103  lung]
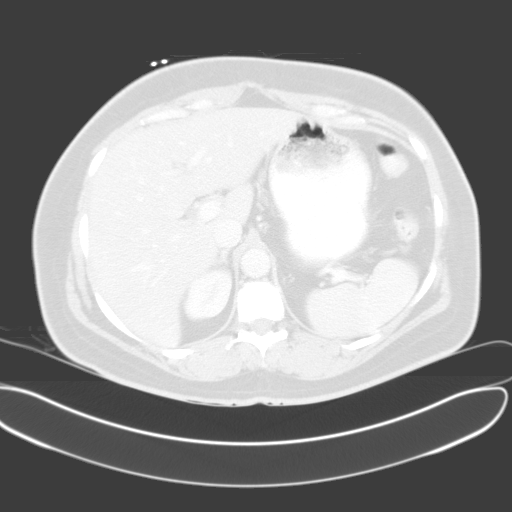
[im 85/103  lung]
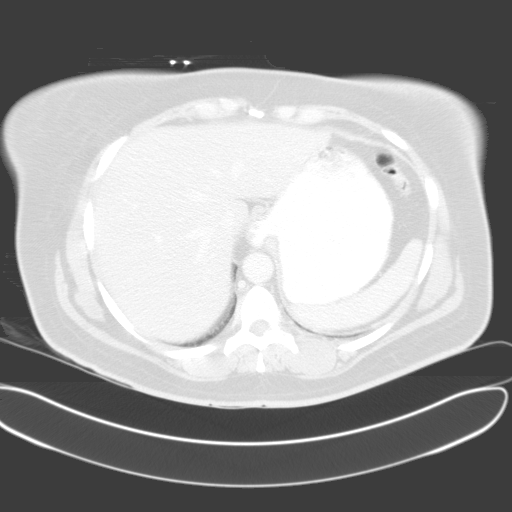
[im 91/103  soft-tissue]
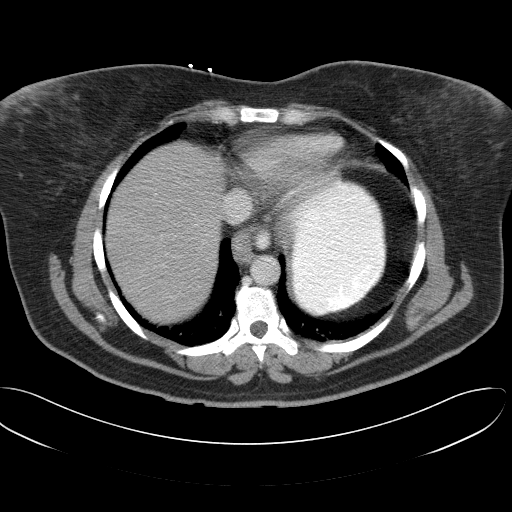
[im 91/103  lung]
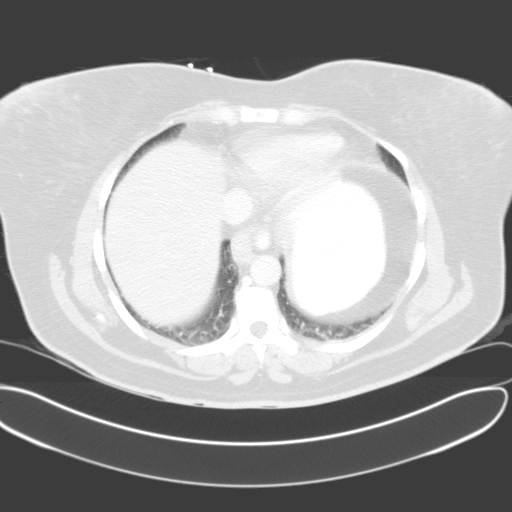
[im 97/103  soft-tissue]
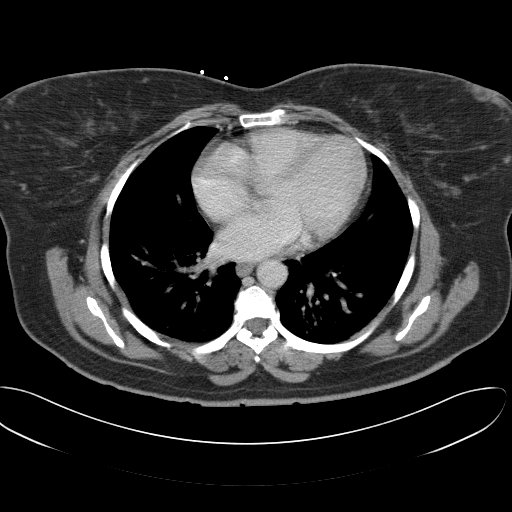
[im 97/103  lung]
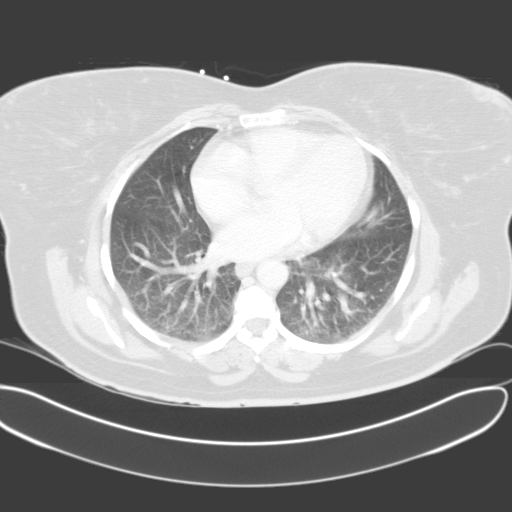

[13 of 32 positions shown; findings below may reference images not displayed]

FINDINGS: After the intravenous injection of 100 milliliters of Omnipaque 300 a series of scans of the abdomen are made without previous films for comparison and shows a moderate sized hiatal hernia.   The lower lung fields are normal.  The gallbladder has been removed.  The liver, hepatic ducts, common bile duct, pancreas, and spleen are normal.  The adrenal glands are normal.  There appears to be a 2 x 3 mm nonobstructing calculus associated with the midpole of the right kidney.  There is no hydronephrosis or hydroureter associated with either kidney.  
 There are two small cysts associated with the upper pole of the right kidney, both of which measure less than 8 mm in diameter.  There are no abnormal lymph nodes.  The oral contrast has passed through the stomach and entire small bowel into the right colon.  The appendix appears normal and there is no evidence of free fluid or inflammation in the abdomen.  The major vessels are normal with minimal calcification of the aorta.  The bones of the lower thoracic and upper lumbar spine are within the limits of normal.
IMPRESSION: Hiatal hernia.  
 Simple cysts of upper pole of the right kidney.  
 Previous cholecystectomy.
 No active disease in the upper abdomen.  
 There is a 2 x 3 mm nonobstructing stone in the lower pole of the right kidney.
 PELVIS CT WITH CONTRAST ? [DATE]:
FINDINGS: CT scan of the pelvis shows no evidence of free fluid or air.  The uterus is slightly prominent and extends slightly to the right of the midline.  The left ovary is visualized and measures 2.5 x 3.4 cm and is within the normal limit.  The ureters show no definite dilatation but there is a 1 x 3 mm calcification seen near the left ureterovesical junction that could be just at the orifice of the ureter into the bladder.  There are also some small pelvic phleboliths present which makes this differentiation difficult.  We will attempt to recall the patient to get delayed films of the pelvis in order to better visualize the relationship of the distal ureter to this calcification which is best seen on image #89/103.  The bones of the pelvis and lower lumbar spine show no significant abnormality.
IMPRESSION: Questionable 1 x 3 mm nonobstructing calculus in the left ureterovesical junction area.

## 2005-05-30 ENCOUNTER — Observation Stay (HOSPITAL_COMMUNITY): Admission: EM | Admit: 2005-05-30 | Discharge: 2005-05-31 | Payer: Self-pay | Admitting: Emergency Medicine

## 2005-05-30 ENCOUNTER — Ambulatory Visit: Payer: Self-pay | Admitting: Cardiology

## 2005-05-30 IMAGING — CR DG CHEST 1V PORT
1 series · 1 of 1 positions shown · non-contrast
Comparison: [DATE].

CLINICAL DATA: Chest pain.  
 PORTABLE CHEST - 1 VIEW [DATE]:

[view not recorded]
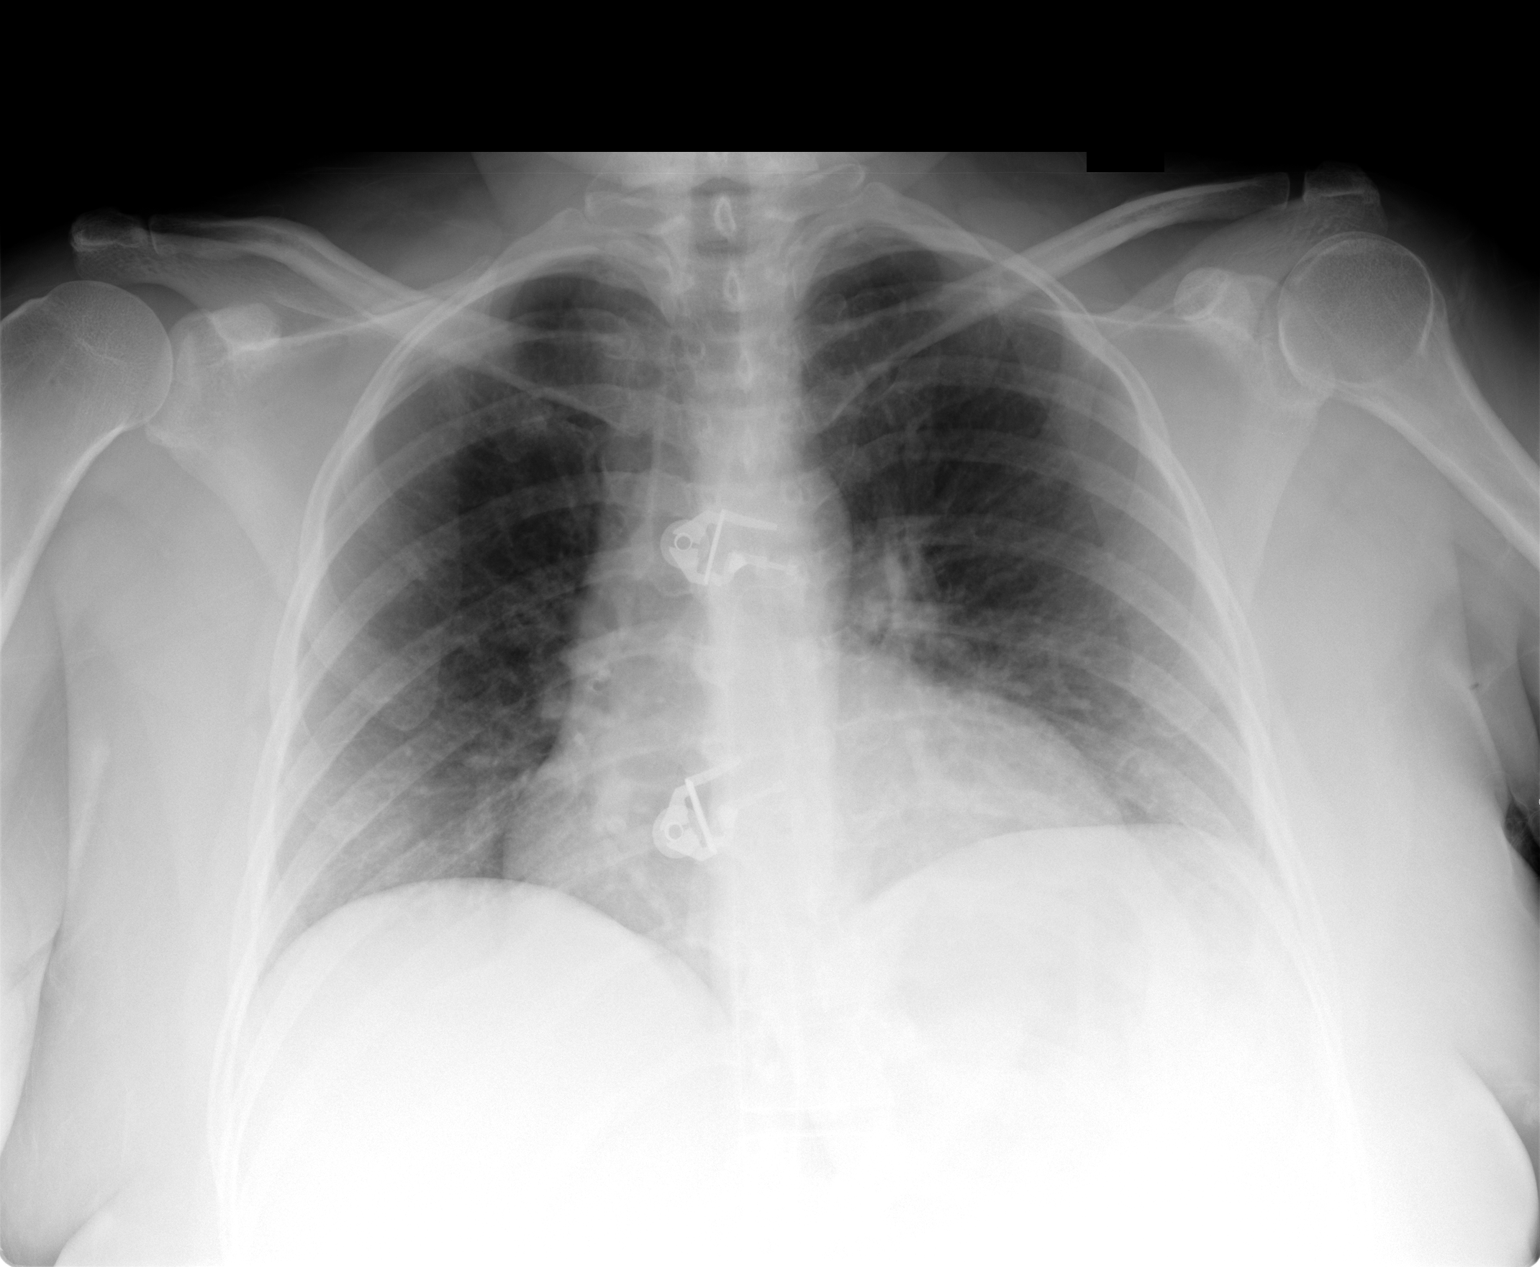

[1 of 1 positions shown; findings below may reference images not displayed]

FINDINGS: Lung volumes are low but the lungs are clear.  Heart size is mildly enlarged.  No effusion.  No focal bony abnormality.
IMPRESSION: Mild cardiomegaly without acute disease.

## 2005-05-31 IMAGING — NM NM MYOCAR MULTI W/ SPECT
2 series · 12 of 12 positions shown · non-contrast
Comparison: none

CLINICAL DATA: This is a 49-year-old female with no known coronary artery disease, atypical chest discomfort, cardiac risk factors of hypertension, hyperlipidemia, and a family history of coronary disease admitted to [HOSPITAL] for chest pain.  Ruled out for myocardial infarction.  This is an evaluation for ischemia.  
 MYOCARDIAL PERFUSION IMAGING STUDY:
 10.0 mCi [61] Myoview was infused at rest.  30.0 mCi [61] Myoview was infused at peak pharmacologic stress.  45 mg of Adenosine was infused over a four minute protocol.  
 DETAILS OF THE PROCEDURE:  Following the infusion of low dose Technetium 99m Myoview, SPECT imaging of the chest was performed.  The patient then underwent an infusion of Adenosine and at peak pharmacologic stress, high dose Technetium 99m Myoview was infused.  SPECT imaging of the chest was performed.  The images were recreated in the short axis, horizontal long, and vertical long axes and the post stress images were used in a gating protocol to assess left ventricular size, wall motion, and function.  
 RESULTS:  There is uniform perfusion throughout all myocardial segments.  There is no evidence of ischemia or scar.  The overall ejection fraction is 77%.  There are no wall motion abnormalities seen.  
 STRESS TEST:  The patient was infused with Adenosine over a four minute protocol.  During that time, she experienced flushing, but no significant chest discomfort. This resolved in recovery.  She had no arrhythmia or ischemic ST-T wave changes.  Her heart rate went from 77 beats a minute to 96 beats a minute.  Her blood pressure remained at 128/78.

[Series 1: cr cardiac tc low dose · 6.52mm/px · 6 of 64 frames shown]
[frame 6/64]
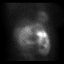
[frame 16/64]
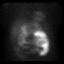
[frame 27/64]
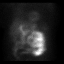
[frame 38/64]
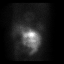
[frame 48/64]
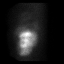
[frame 59/64]
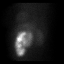

[Series 1: cs cardiac tc hi dose · 6.52mm/px · 6 of 512 frames shown]
[frame 43/512]
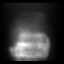
[frame 128/512]
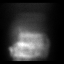
[frame 214/512]
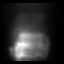
[frame 299/512]
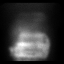
[frame 384/512]
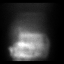
[frame 470/512]
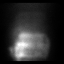

[12 of 12 positions shown; findings below may reference images not displayed]

IMPRESSION: This is a low risk scan in a patient with no known coronary artery disease.  Clinical correlation is advised.

## 2005-11-12 ENCOUNTER — Ambulatory Visit (HOSPITAL_COMMUNITY): Admission: RE | Admit: 2005-11-12 | Discharge: 2005-11-12 | Payer: Self-pay | Admitting: Obstetrics and Gynecology

## 2006-05-08 ENCOUNTER — Ambulatory Visit: Payer: Self-pay | Admitting: Gastroenterology

## 2006-06-04 DIAGNOSIS — K635 Polyp of colon: Secondary | ICD-10-CM

## 2006-06-04 HISTORY — PX: COLONOSCOPY: SHX174

## 2006-06-04 HISTORY — DX: Polyp of colon: K63.5

## 2006-06-14 ENCOUNTER — Encounter (INDEPENDENT_AMBULATORY_CARE_PROVIDER_SITE_OTHER): Payer: Self-pay | Admitting: Specialist

## 2006-06-14 ENCOUNTER — Ambulatory Visit: Payer: Self-pay | Admitting: Gastroenterology

## 2006-06-14 ENCOUNTER — Ambulatory Visit (HOSPITAL_COMMUNITY): Admission: RE | Admit: 2006-06-14 | Discharge: 2006-06-14 | Payer: Self-pay | Admitting: Gastroenterology

## 2006-07-22 ENCOUNTER — Ambulatory Visit: Payer: Self-pay | Admitting: Gastroenterology

## 2006-10-08 ENCOUNTER — Ambulatory Visit: Payer: Self-pay | Admitting: Gastroenterology

## 2006-10-16 ENCOUNTER — Encounter: Payer: Self-pay | Admitting: Gastroenterology

## 2006-10-16 ENCOUNTER — Ambulatory Visit: Payer: Self-pay | Admitting: Gastroenterology

## 2006-10-16 ENCOUNTER — Ambulatory Visit (HOSPITAL_COMMUNITY): Admission: RE | Admit: 2006-10-16 | Discharge: 2006-10-16 | Payer: Self-pay | Admitting: Gastroenterology

## 2006-10-28 ENCOUNTER — Ambulatory Visit: Payer: Self-pay | Admitting: Gastroenterology

## 2006-12-05 ENCOUNTER — Ambulatory Visit (HOSPITAL_COMMUNITY): Admission: RE | Admit: 2006-12-05 | Discharge: 2006-12-05 | Payer: Self-pay | Admitting: Obstetrics and Gynecology

## 2007-02-14 ENCOUNTER — Ambulatory Visit: Payer: Self-pay | Admitting: Gastroenterology

## 2007-08-26 ENCOUNTER — Ambulatory Visit: Payer: Self-pay | Admitting: Gastroenterology

## 2008-01-21 ENCOUNTER — Ambulatory Visit (HOSPITAL_COMMUNITY): Admission: RE | Admit: 2008-01-21 | Discharge: 2008-01-21 | Payer: Self-pay | Admitting: Obstetrics and Gynecology

## 2008-02-10 ENCOUNTER — Other Ambulatory Visit: Admission: RE | Admit: 2008-02-10 | Discharge: 2008-02-10 | Payer: Self-pay | Admitting: Obstetrics and Gynecology

## 2008-05-22 ENCOUNTER — Emergency Department (HOSPITAL_COMMUNITY): Admission: EM | Admit: 2008-05-22 | Discharge: 2008-05-22 | Payer: Self-pay | Admitting: Emergency Medicine

## 2009-02-15 ENCOUNTER — Other Ambulatory Visit: Admission: RE | Admit: 2009-02-15 | Discharge: 2009-02-15 | Payer: Self-pay | Admitting: Obstetrics & Gynecology

## 2009-02-28 ENCOUNTER — Encounter: Payer: Self-pay | Admitting: Obstetrics & Gynecology

## 2009-02-28 ENCOUNTER — Ambulatory Visit (HOSPITAL_COMMUNITY): Admission: RE | Admit: 2009-02-28 | Discharge: 2009-02-28 | Payer: Self-pay | Admitting: Family Medicine

## 2009-02-28 IMAGING — CR DG TIBIA/FIBULA 2V*L*
2 series · 2 of 2 positions shown · non-contrast
Comparison: None

CLINICAL DATA: Left leg pain

LEFT TIBIA AND FIBULA - 2 VIEW

[view not recorded (1 of 2)]
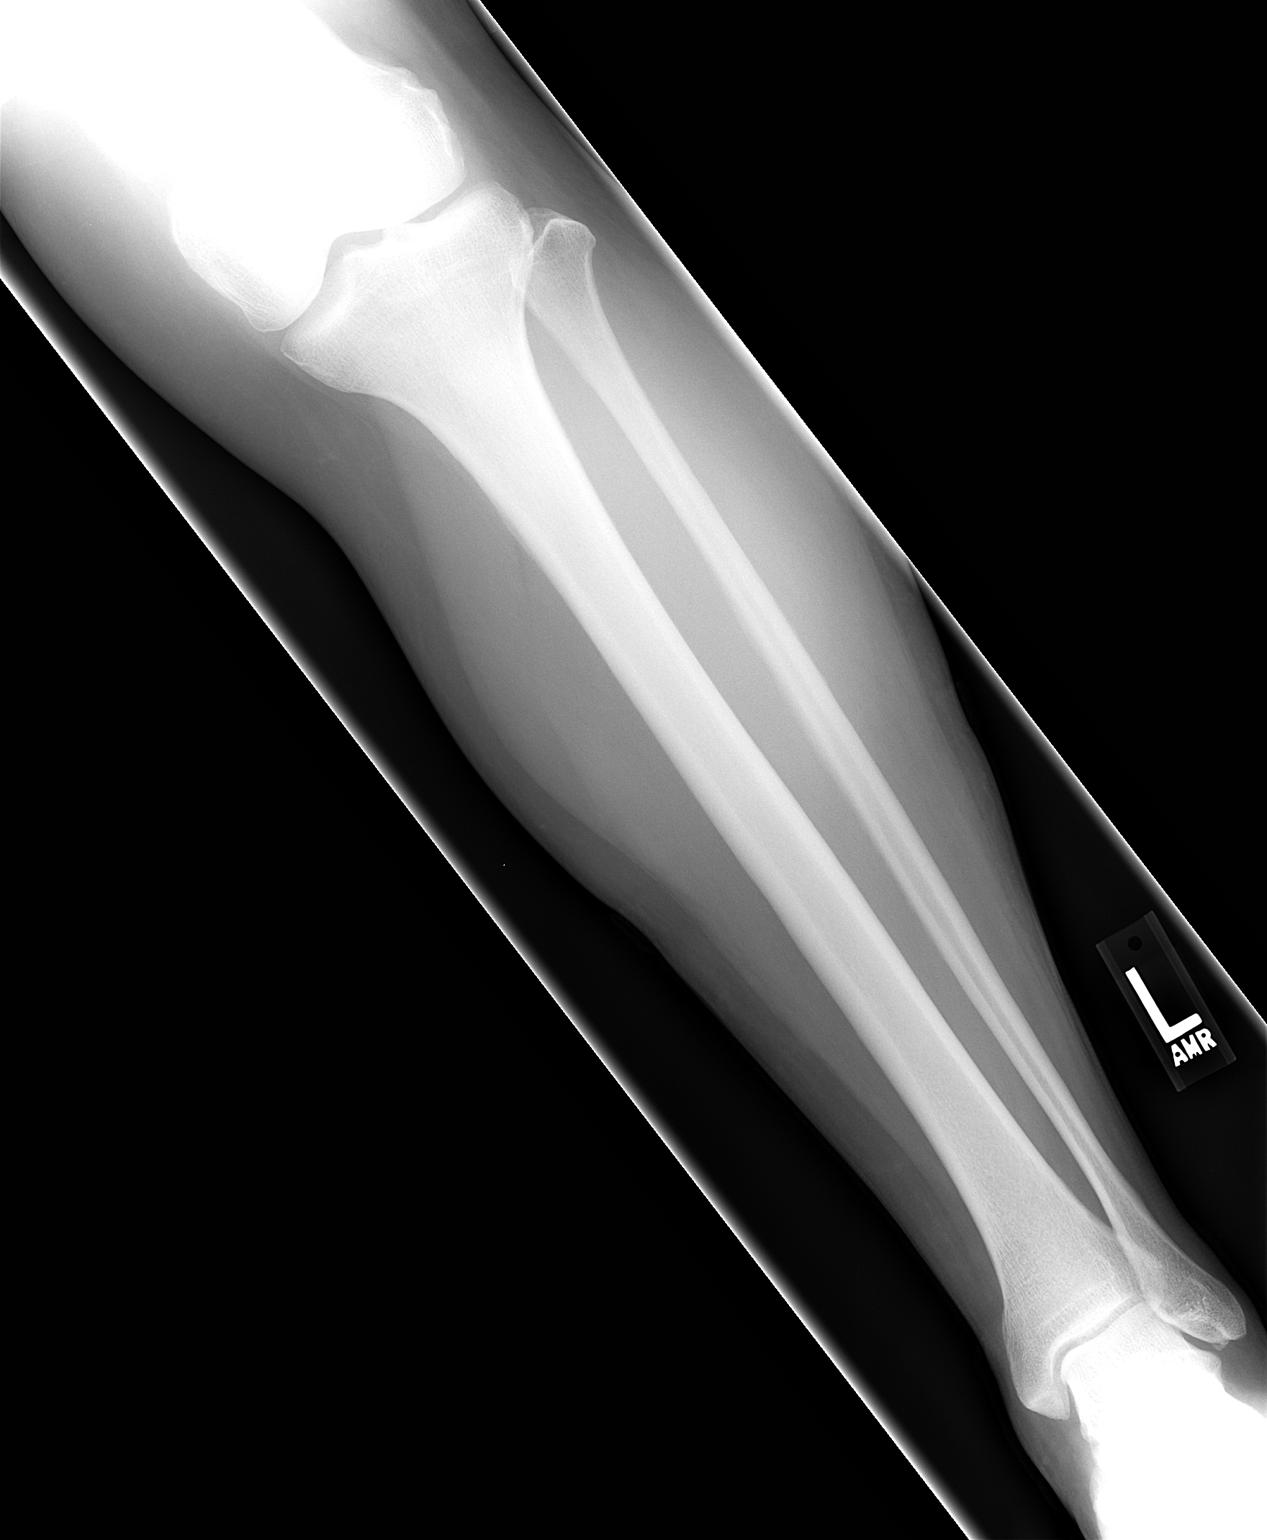

[view not recorded (2 of 2)]
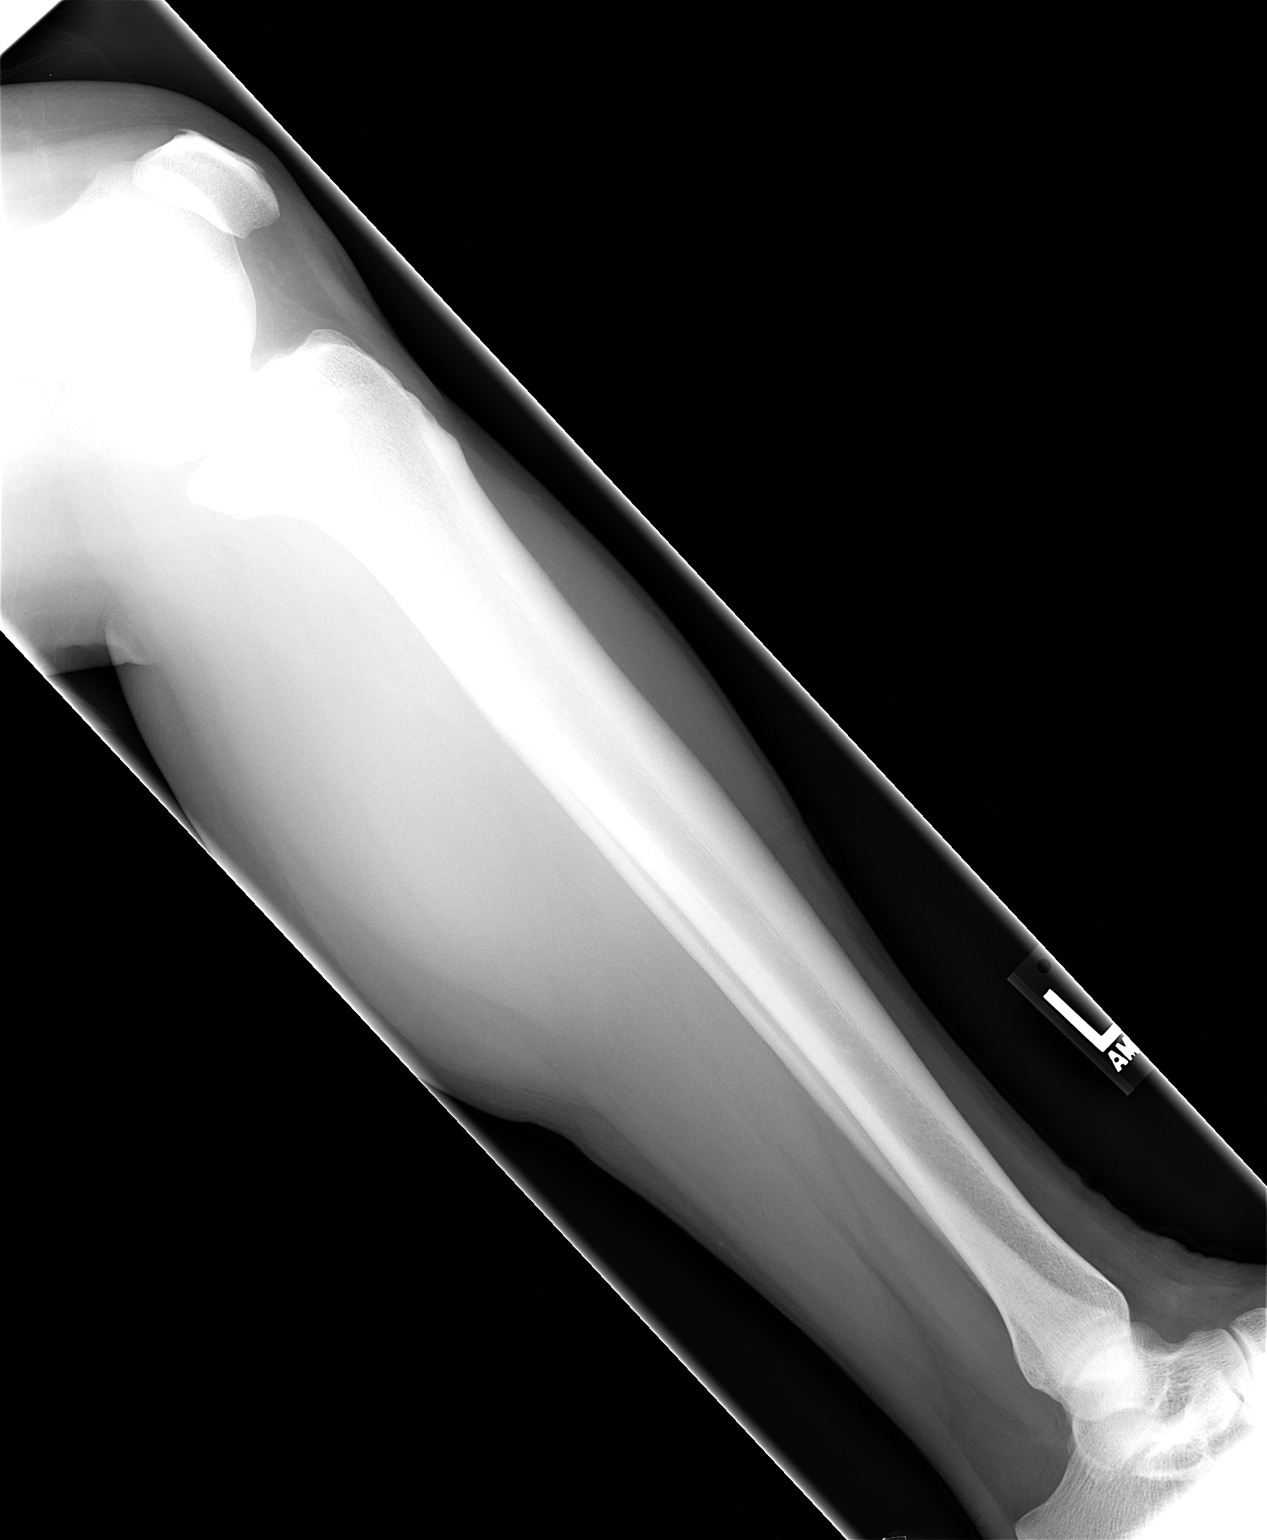

[2 of 2 positions shown; findings below may reference images not displayed]

FINDINGS: No evidence of fracture of the left tibia or fibula.  The
knee joint and ankle joint appears normal.  A small bone fragment
inferior to the lateral malleolus likely represents remote avulsion
fracture.  No soft tissue abnormality. There is mild spurring of
the superior aspect of the patella.
IMPRESSION: There is no acute osseous abnormality of the left tibia or fibula.

## 2009-02-28 IMAGING — MG MM DIGITAL SCREENING
5 series · 5 of 5 positions shown · non-contrast
Comparison: none

DG SCREEN MAMMOGRAM BILATERAL
Bilateral CC and MLO view(s) were taken.
Technologist: [REDACTED]

DIGITAL SCREENING MAMMOGRAM WITH CAD:
There are scattered fibroglandular densities.  No masses or malignant type calcifications are 
identified.  Compared with prior studies.
Images were processed with CAD.

[L CC]
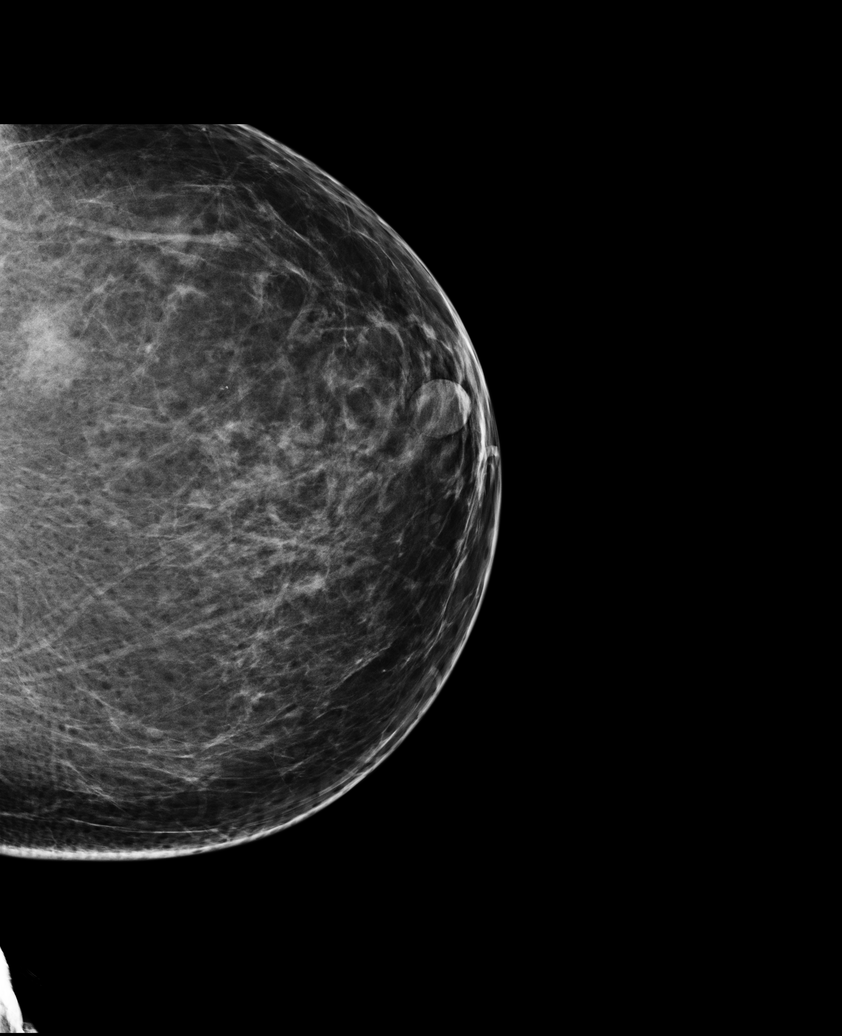

[L MLO]
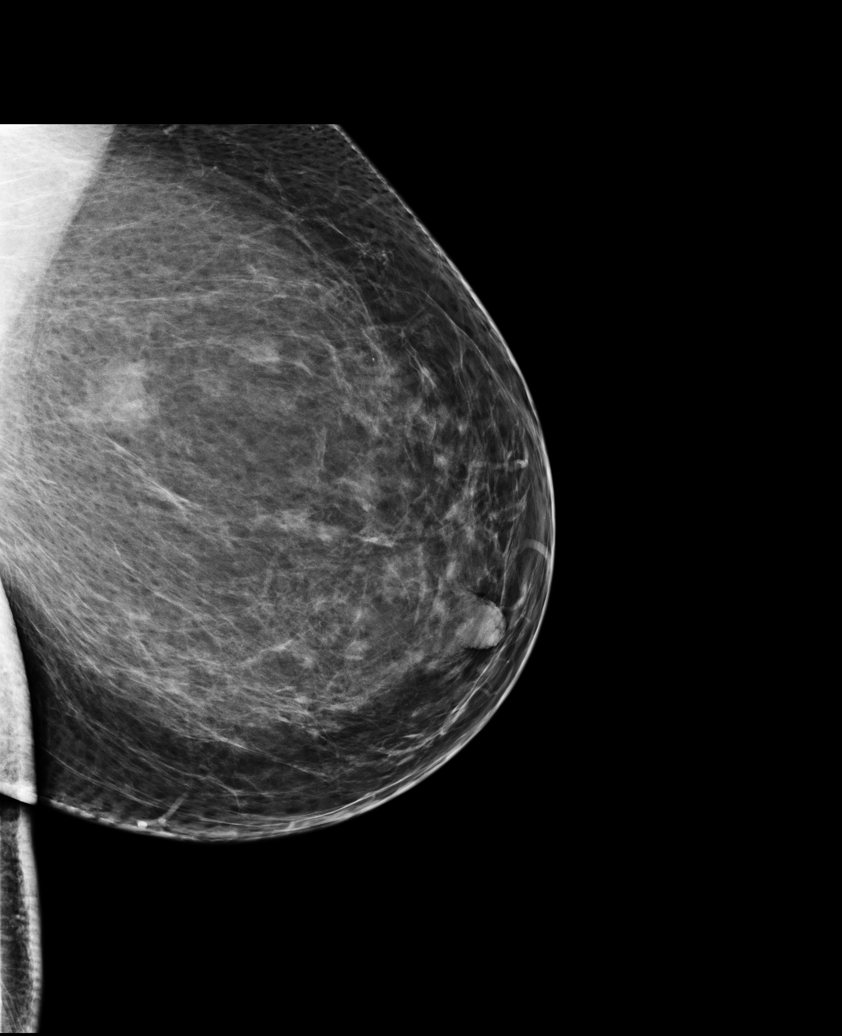

[R CC]
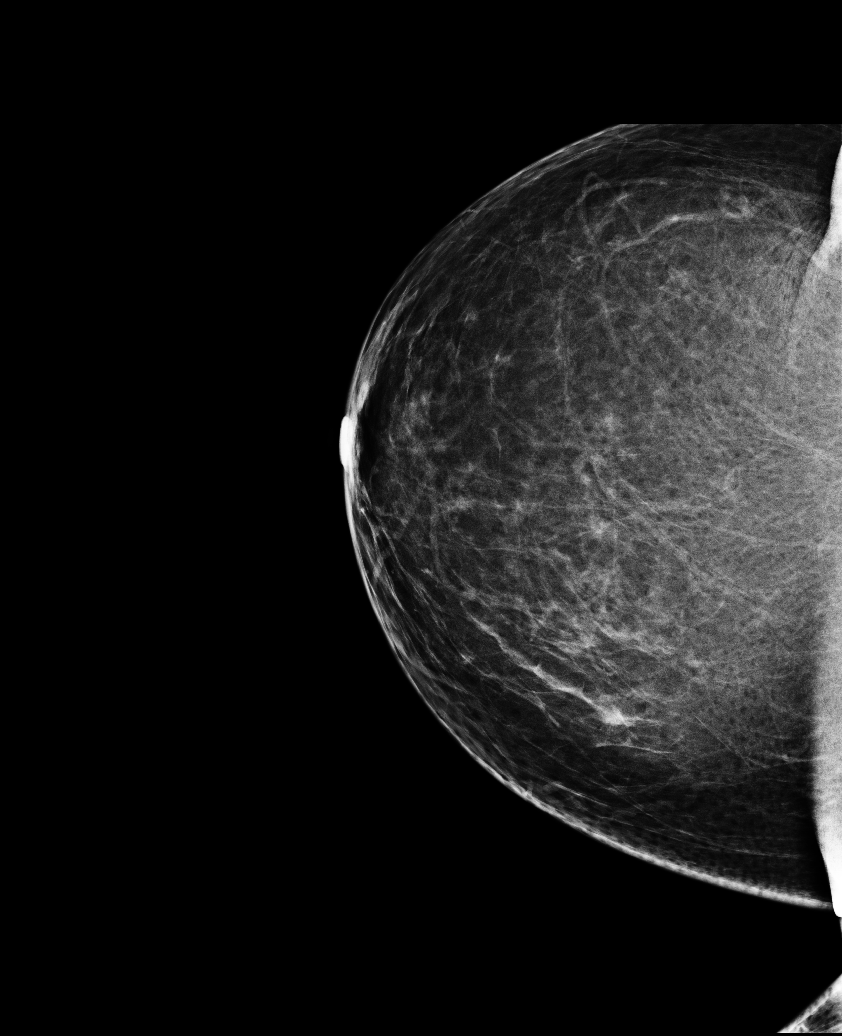

[R MLO (1 of 2)]
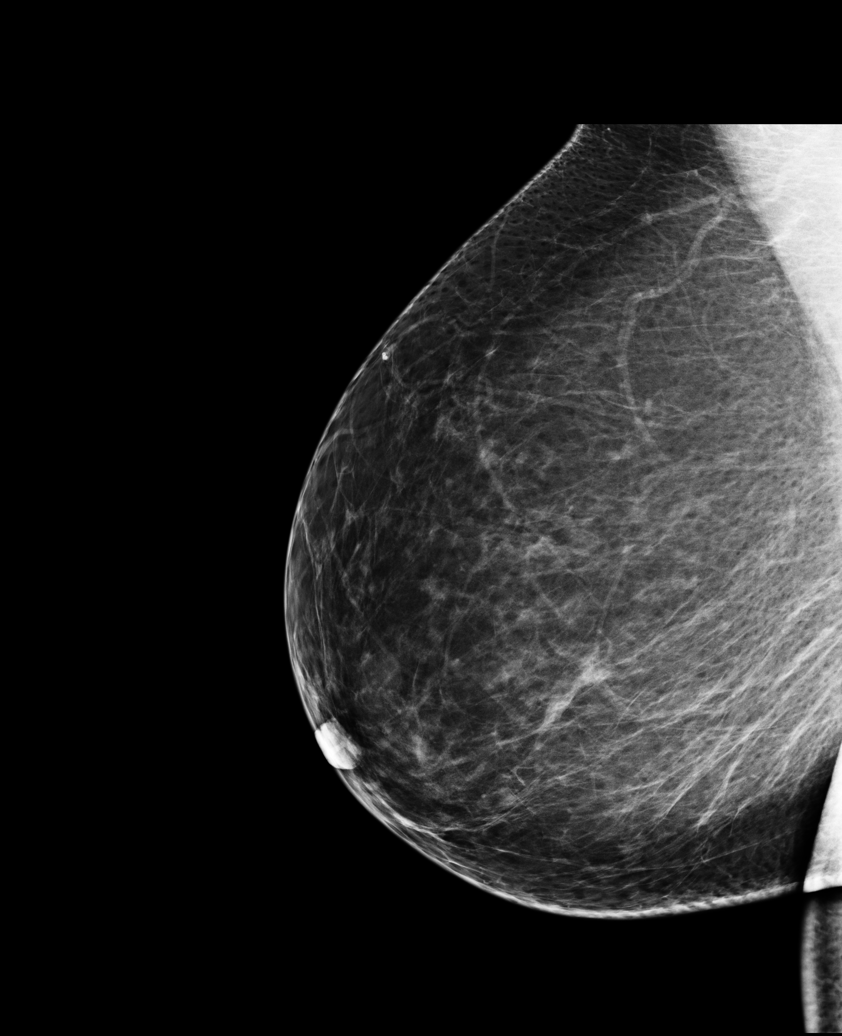

[R MLO (2 of 2)]
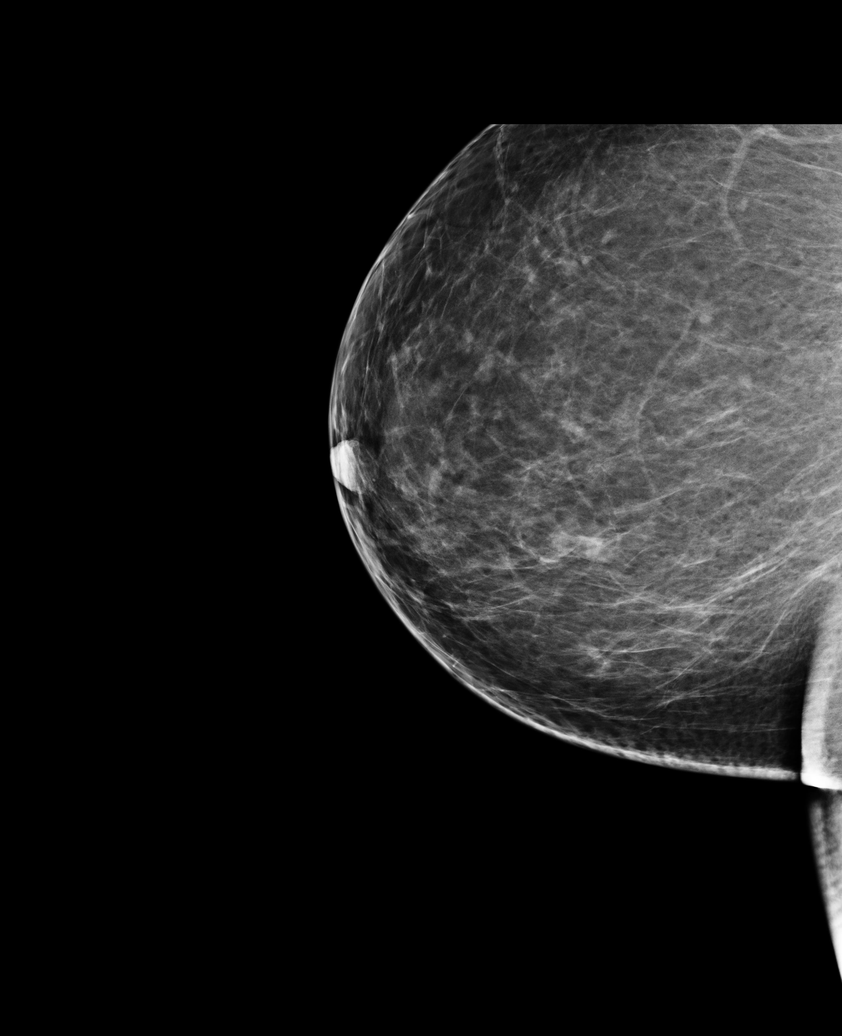

[5 of 5 positions shown; findings below may reference images not displayed]

IMPRESSION: No specific mammographic evidence of malignancy.  Next screening mammogram is recommended in one 
year.

A result letter of this screening mammogram will be mailed directly to the patient.

ASSESSMENT: Negative - BI-RADS 1

Screening mammogram in 1 year.
,

## 2009-06-13 ENCOUNTER — Ambulatory Visit (HOSPITAL_COMMUNITY): Admission: RE | Admit: 2009-06-13 | Discharge: 2009-06-13 | Payer: Self-pay | Admitting: Family Medicine

## 2009-06-13 IMAGING — CR DG CHEST 2V
2 series · 2 of 2 positions shown · non-contrast
Comparison: [DATE].

CLINICAL DATA: Cough

CHEST - 2 VIEW

[view not recorded (1 of 2)]
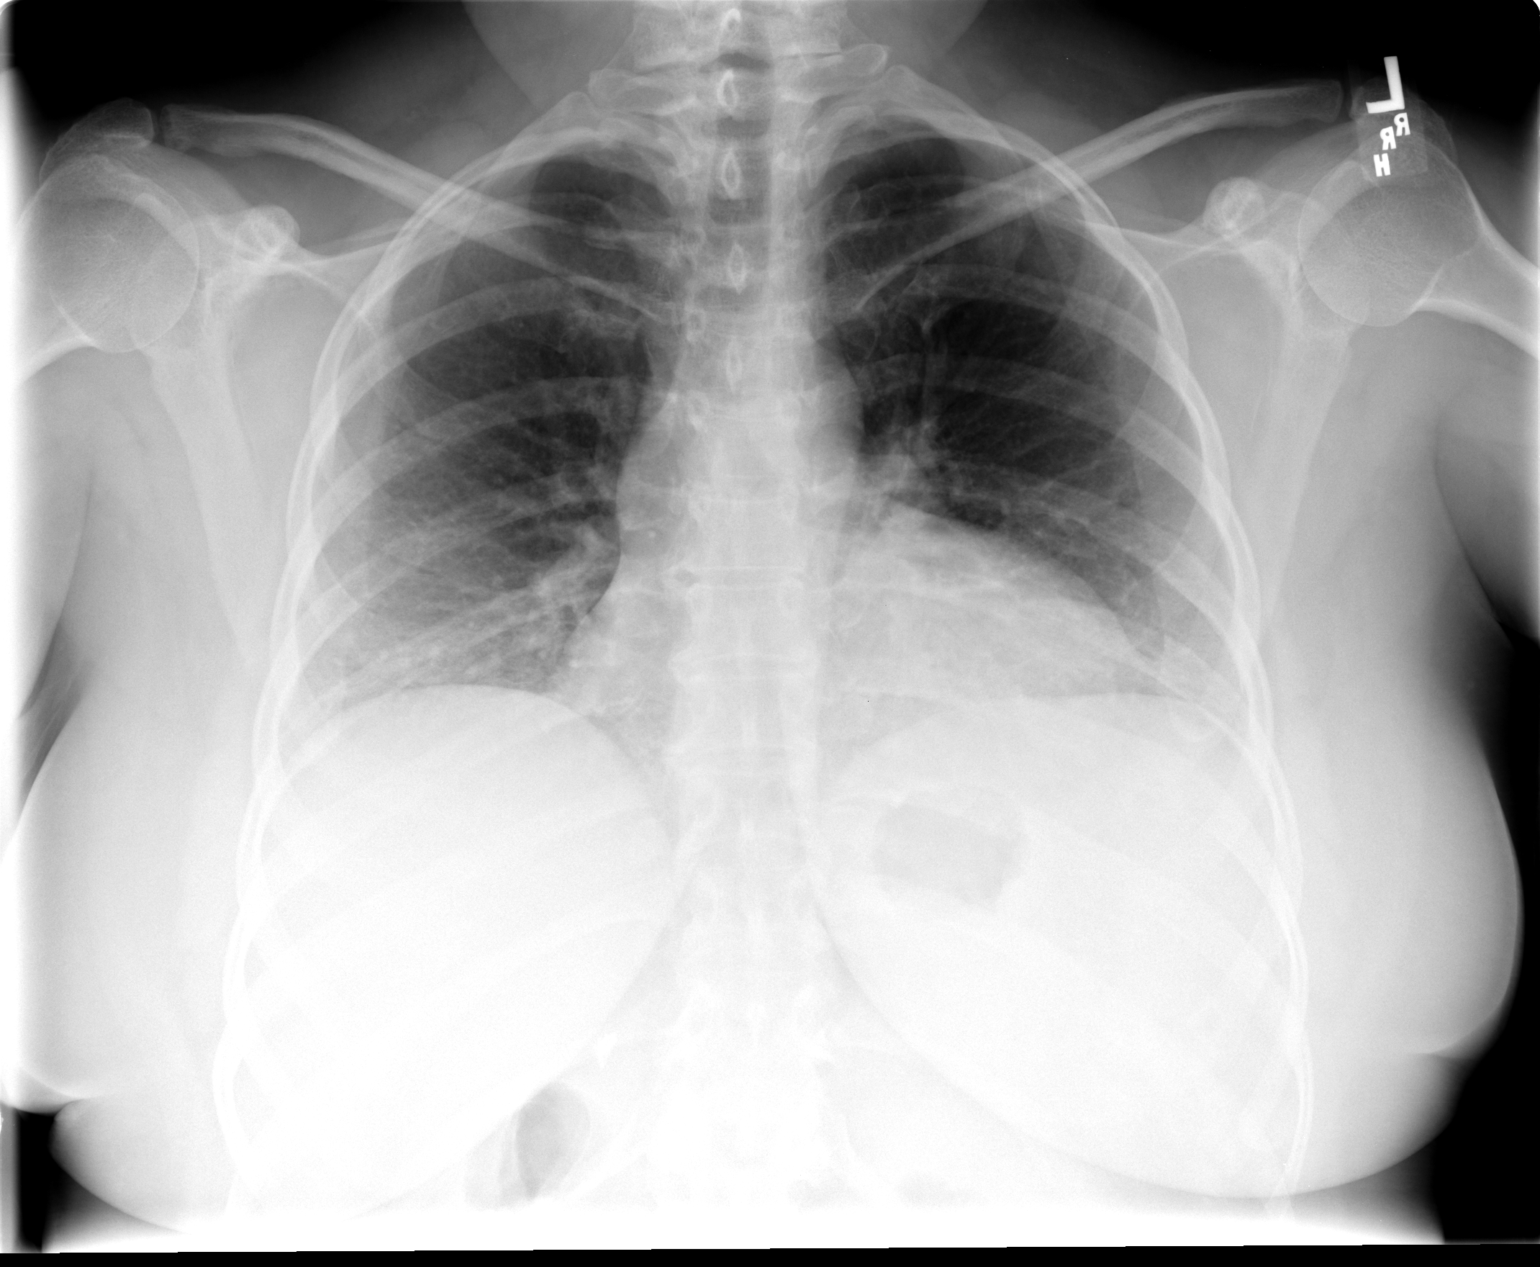

[view not recorded (2 of 2)]
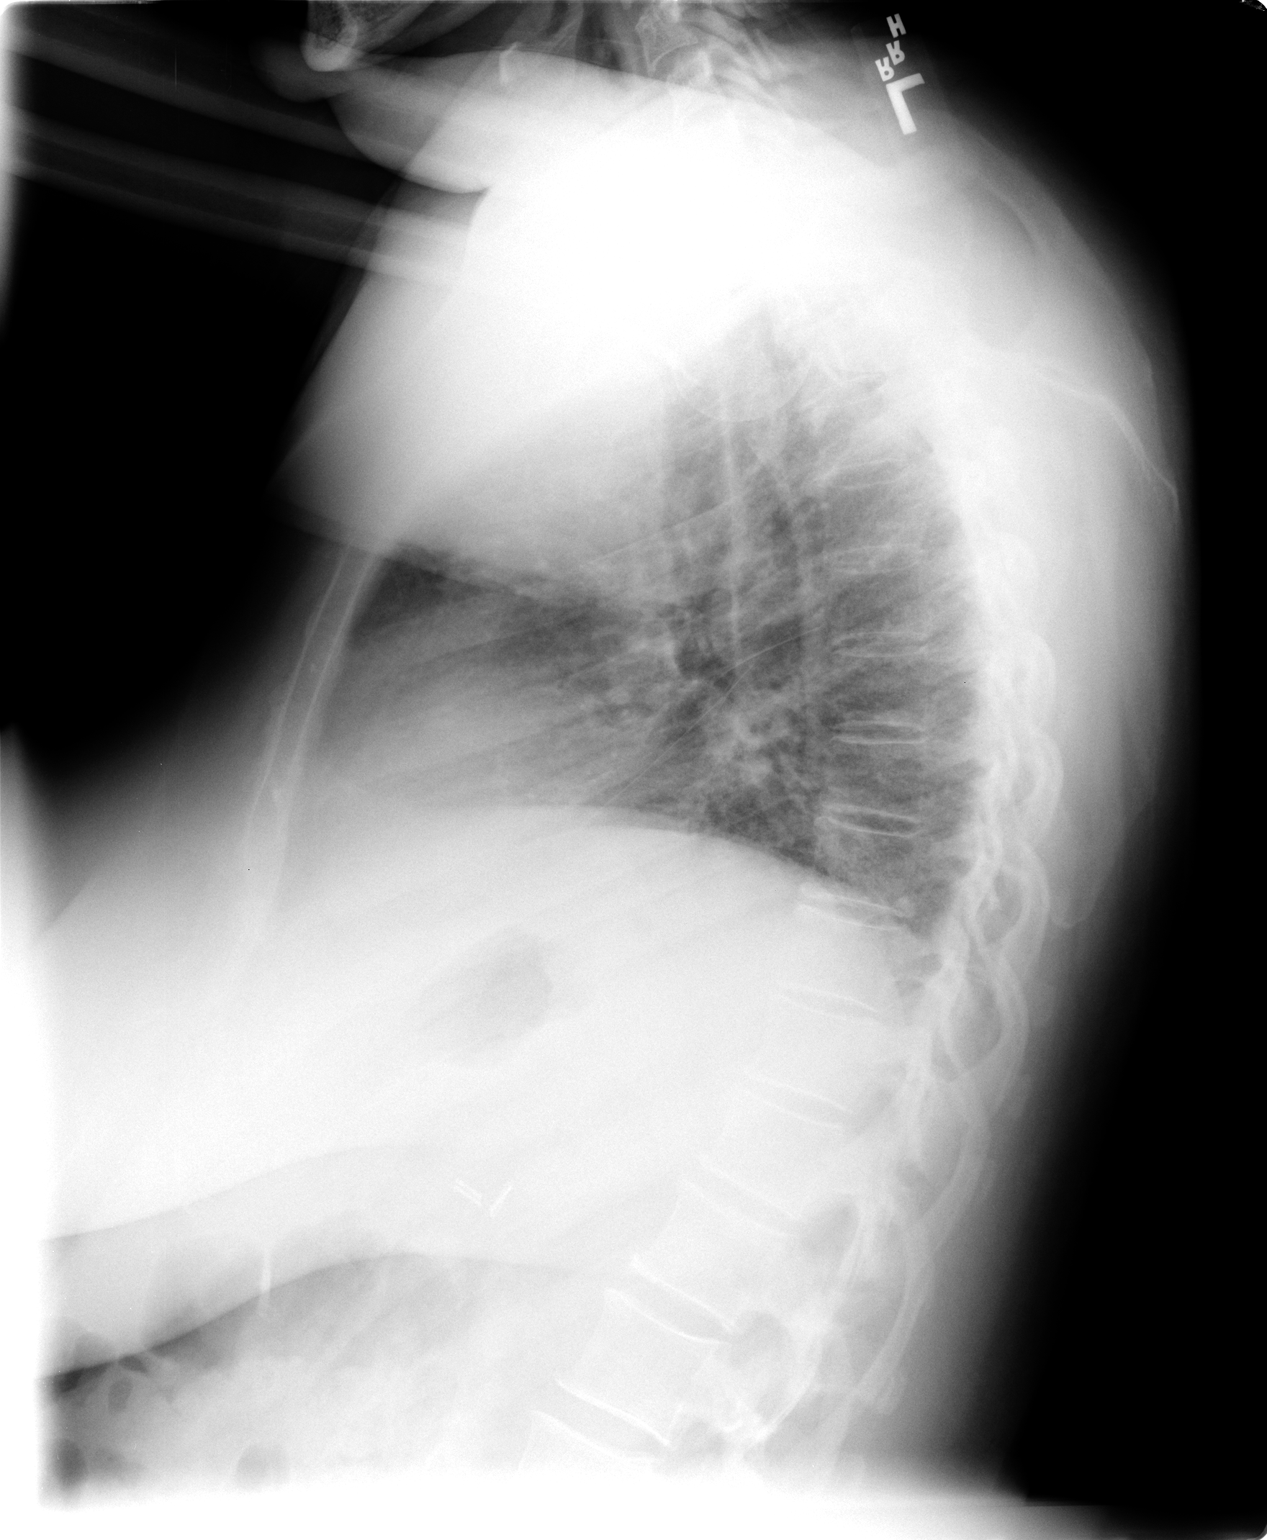

[2 of 2 positions shown; findings below may reference images not displayed]

FINDINGS: Low volume lungs.  Cardiomediastinal silhouette
unremarkable.  Suggestion of mild bronchial wall thickening
compatible with chronic bronchitic changes. No infiltrate.
IMPRESSION: Mild chronic bronchitic changes.  Negative for pneumonia.

## 2009-06-13 IMAGING — CR DG SINUSES COMPLETE 3+V
3 series · 3 of 3 positions shown · non-contrast
Comparison: Limited paranasal sinus CT [DATE].

CLINICAL DATA: Chronic sinusitis.  Multiple rounds of antibiotic
therapy.  Cough.

PARANASAL SINUSES - COMPLETE 3 + VIEW [DATE]:

[view not recorded (1 of 3)]
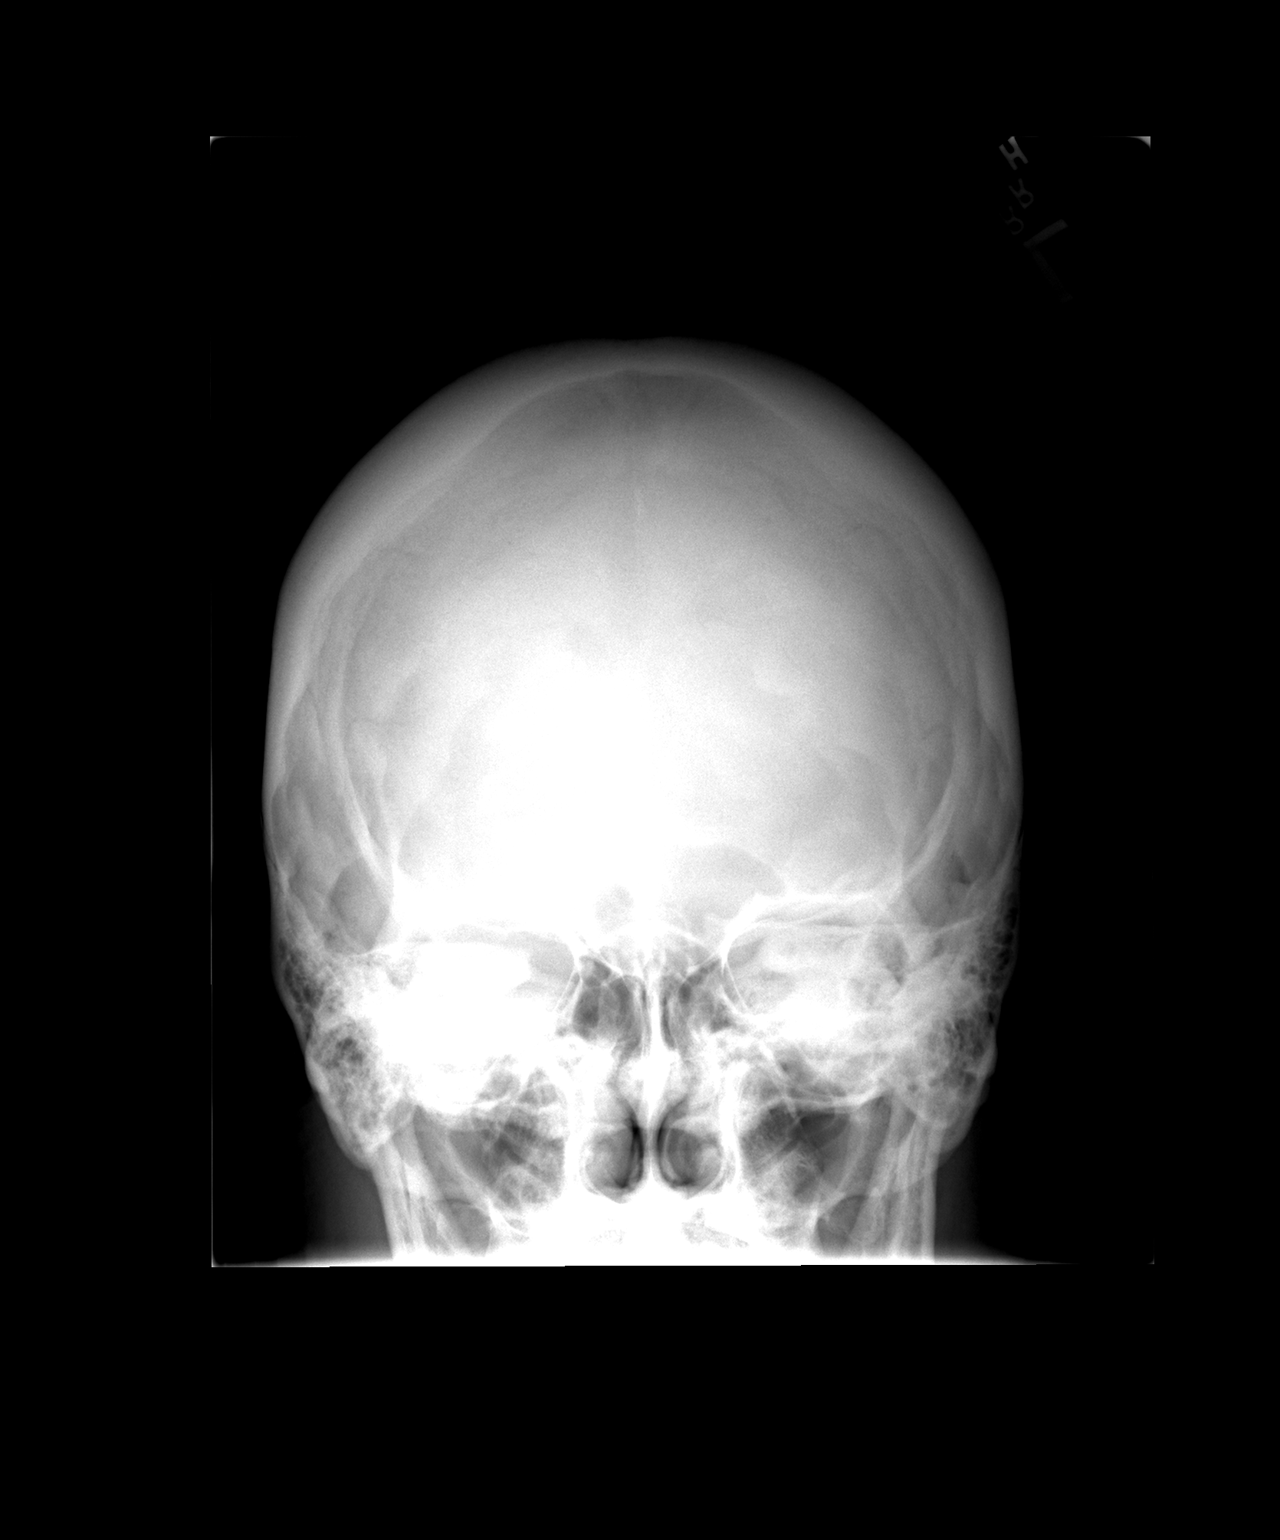

[view not recorded (2 of 3)]
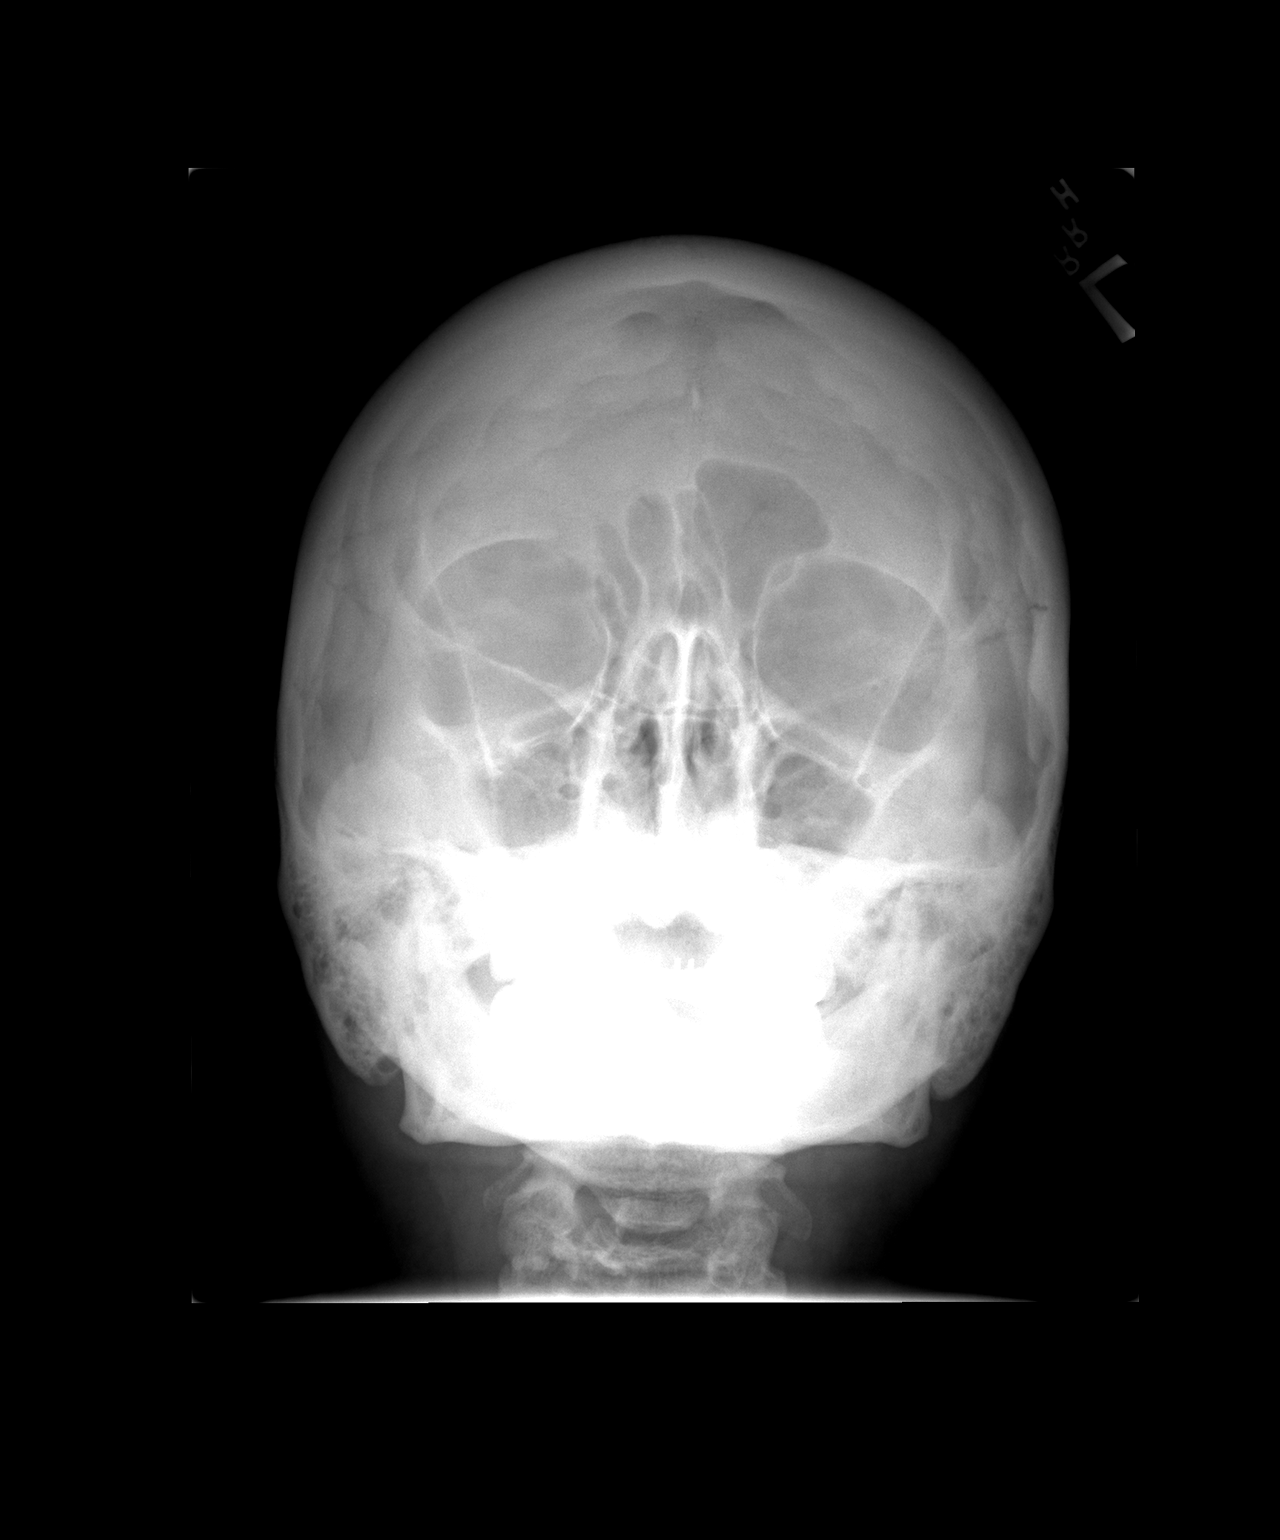

[view not recorded (3 of 3)]
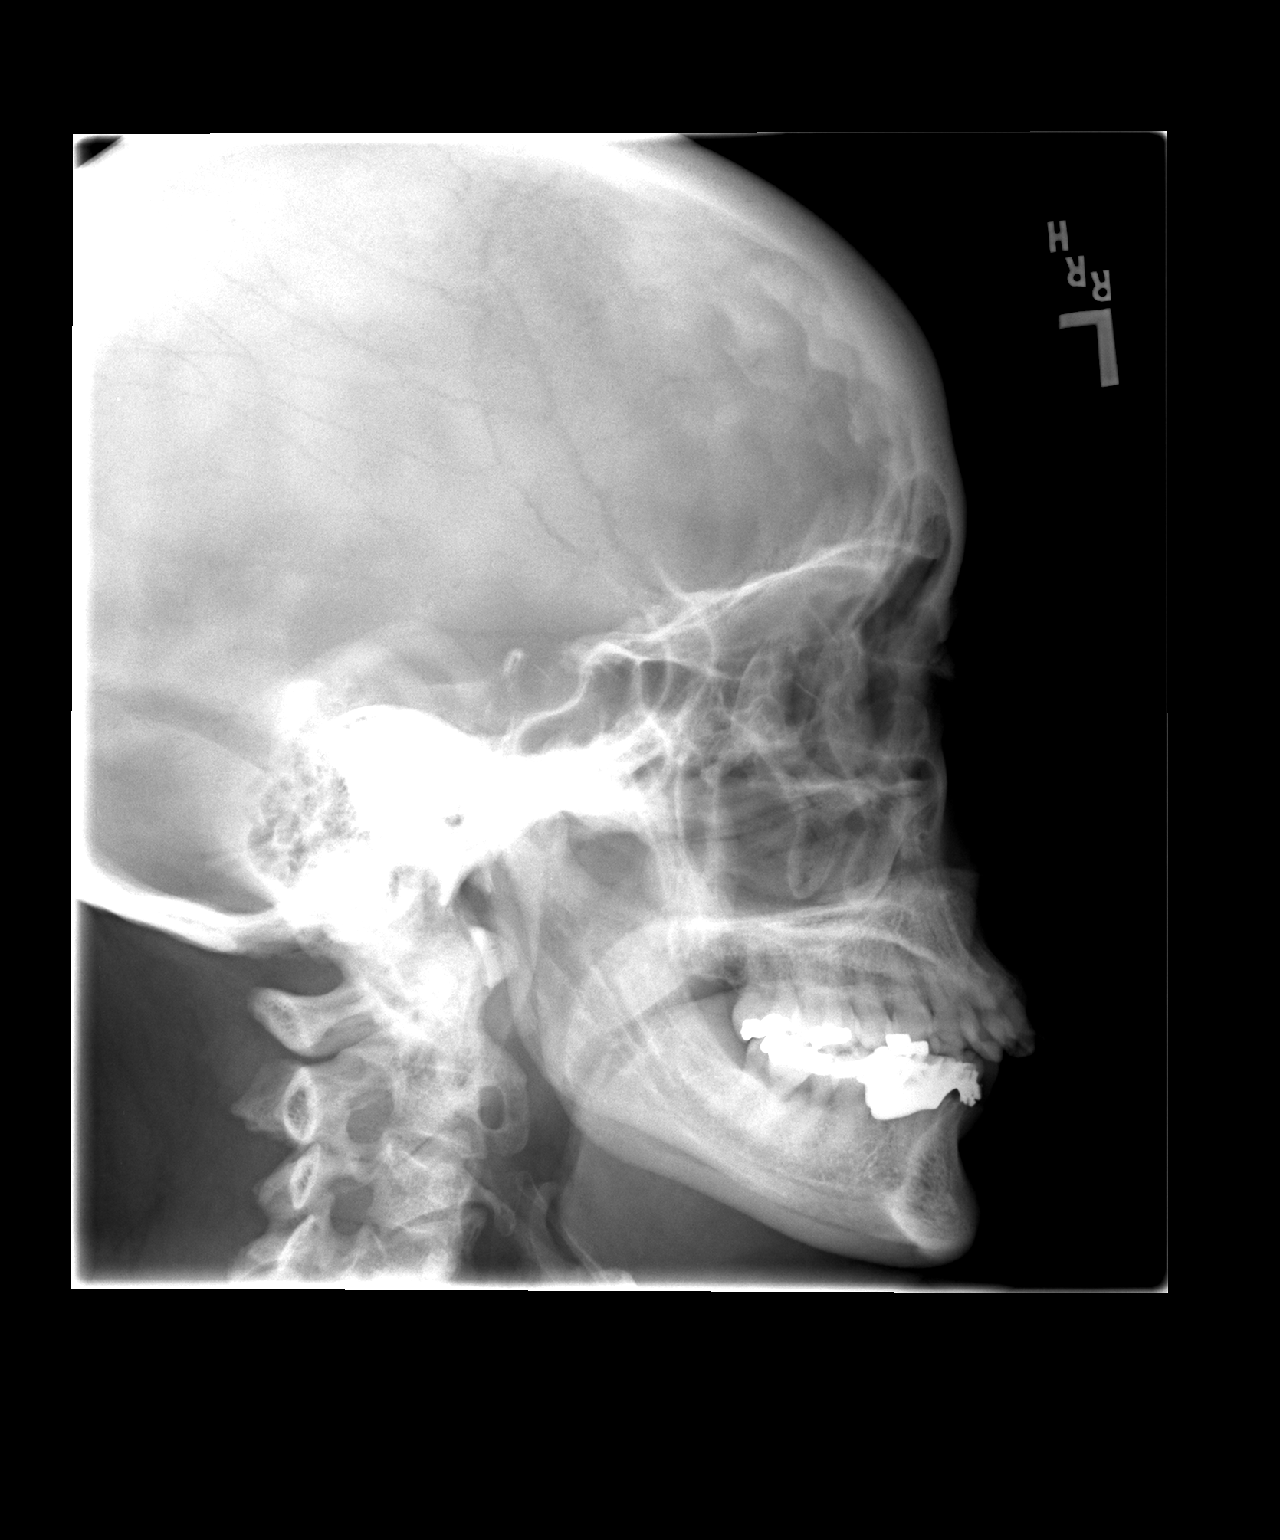

[3 of 3 positions shown; findings below may reference images not displayed]

FINDINGS: Paranasal sinuses well-aerated without evidence of
mucosal thickening, mucous retention cyst or polyp, or air-fluid
levels.  Right frontal sinus relatively hypoplastic.  Bony nasal
septum midline.
IMPRESSION: No evidence of sinusitis.

## 2009-06-16 ENCOUNTER — Encounter (INDEPENDENT_AMBULATORY_CARE_PROVIDER_SITE_OTHER): Payer: Self-pay

## 2009-08-10 ENCOUNTER — Emergency Department (HOSPITAL_COMMUNITY): Admission: EM | Admit: 2009-08-10 | Discharge: 2009-08-10 | Payer: Self-pay | Admitting: Emergency Medicine

## 2009-08-10 IMAGING — CR DG CHEST 2V
2 series · 2 of 2 positions shown · non-contrast
Comparison: [DATE].

CLINICAL DATA: Chest pain.  Rapid heart rate.

CHEST - 2 VIEW

[view not recorded (1 of 2)]
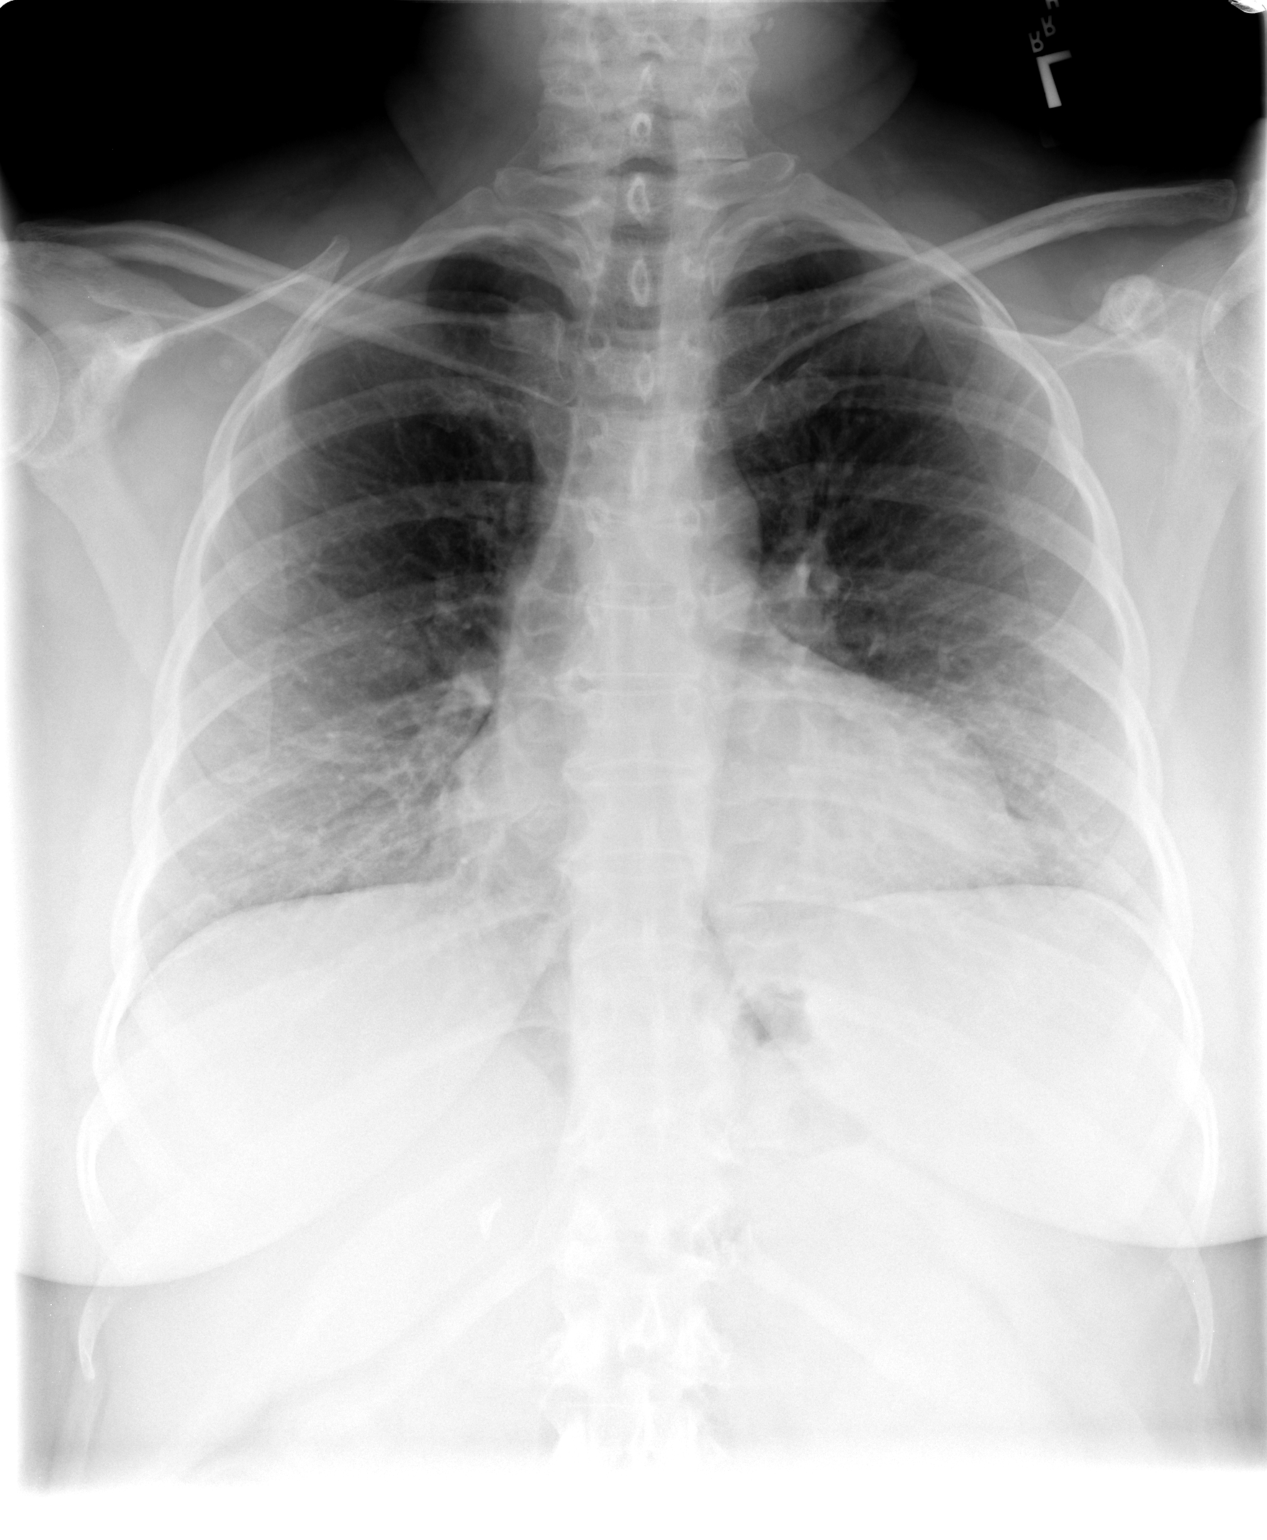

[view not recorded (2 of 2)]
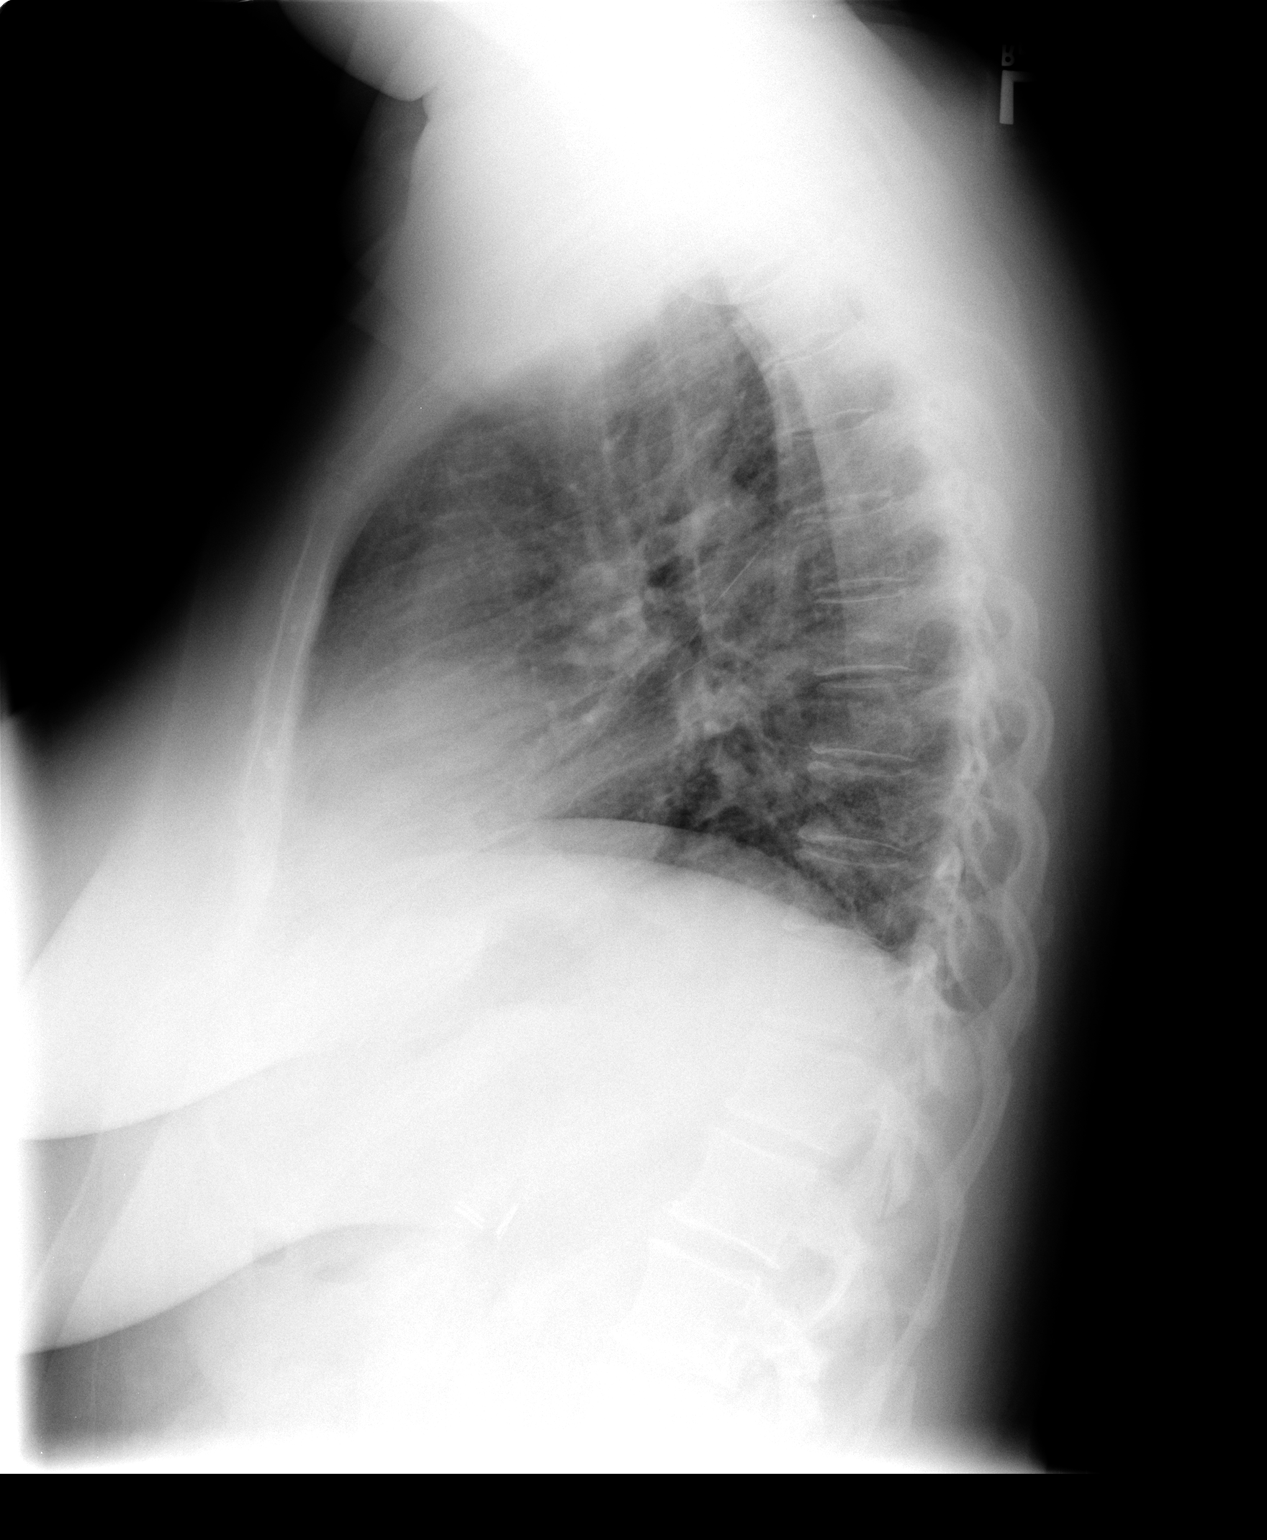

[2 of 2 positions shown; findings below may reference images not displayed]

FINDINGS: The cardiac silhouette is minimally enlarged. There is
central peribronchial thickening.  No pulmonary edema,
consolidation, or pleural effusion is seen.  Osteophytes are
present in the spine.  There is osteopenic appearance of bones.
IMPRESSION: The cardiac silhouette is minimally enlarged. There is some central
peribronchial thickening.  No pulmonary edema, airspace disease, or
pleural effusion is seen.

## 2009-12-08 ENCOUNTER — Encounter (HOSPITAL_COMMUNITY): Admission: RE | Admit: 2009-12-08 | Discharge: 2009-12-09 | Payer: Self-pay | Admitting: Family Medicine

## 2009-12-08 IMAGING — CT CT HEAD W/O CM
1 series · 16 of 30 positions shown, 20 images · non-contrast
Comparison: [DATE].

CLINICAL DATA: Progressive headaches.  Hypertension.  Sharp
burning pain sensation in the posterior aspect of the head [DATE].

CT HEAD WITHOUT CONTRAST
TECHNIQUE: Contiguous axial images were obtained from the base of
the skull through the vertex without contrast

[Series 2: headseq 4.8 h37s · axial · 0.43mm/px · z∈[+95,+252]mm · 16 of 36 slices shown, 20 images]
[im 2/36  brain]
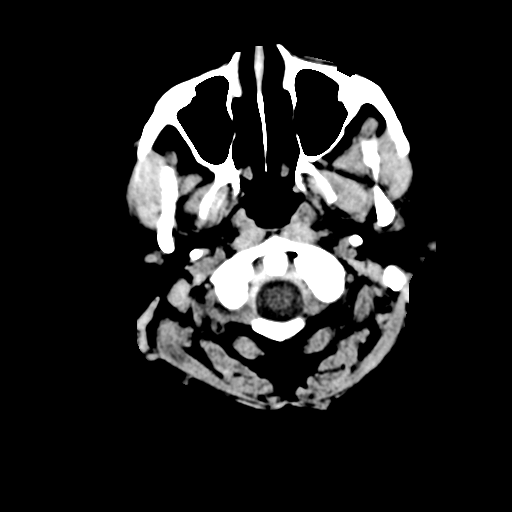
[im 2/36  bone]
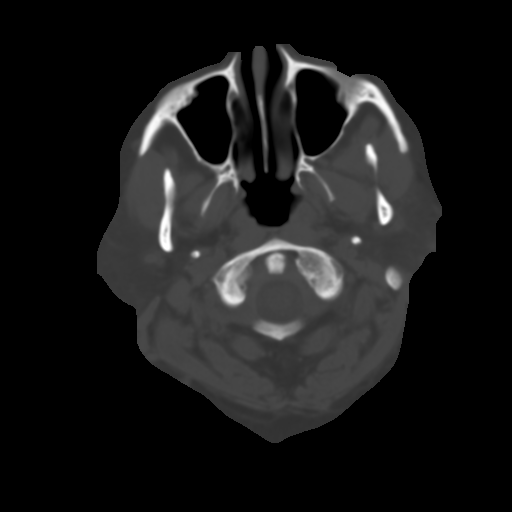
[im 4/36  brain]
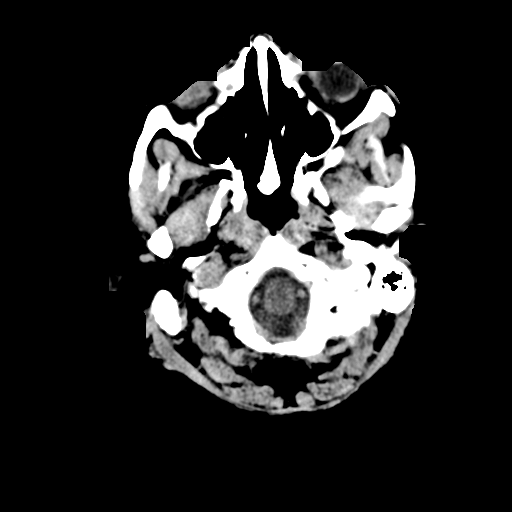
[im 7/36  brain]
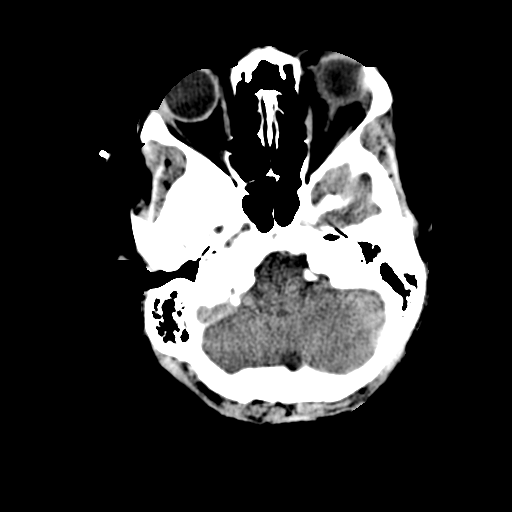
[im 9/36  brain]
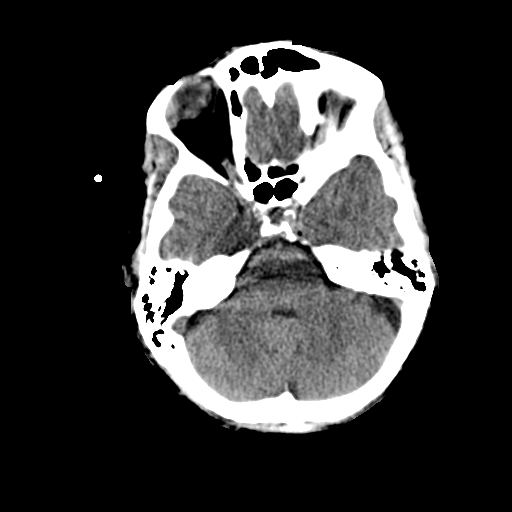
[im 10/36  brain]
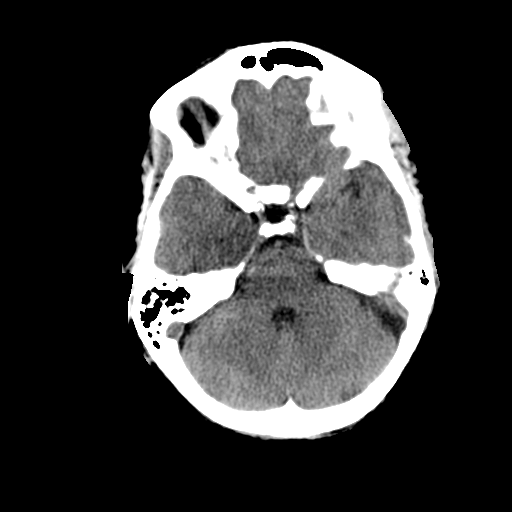
[im 10/36  bone]
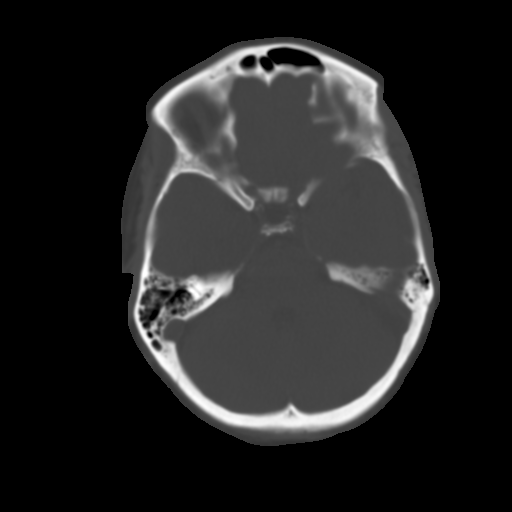
[im 13/36  brain]
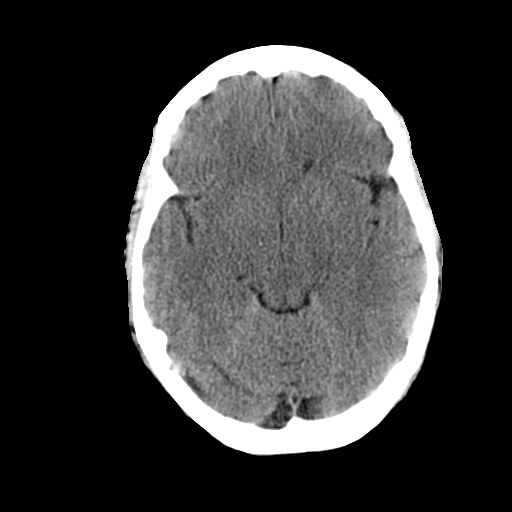
[im 15/36  brain]
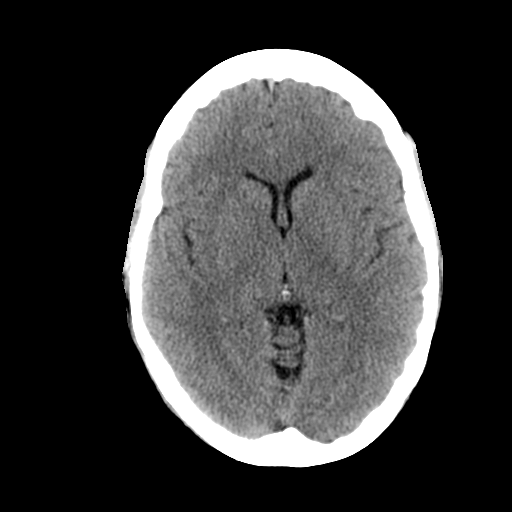
[im 17/36  brain]
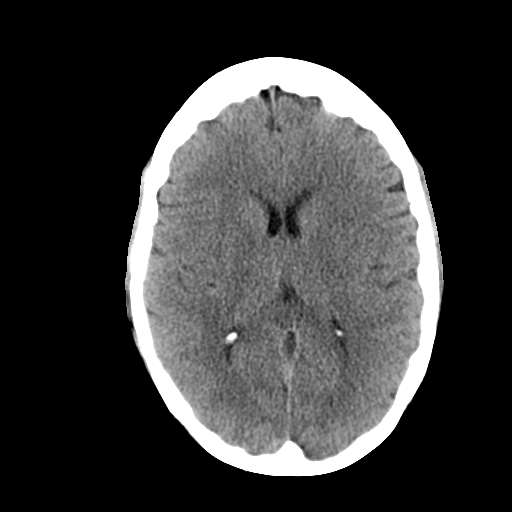
[im 19/36  brain]
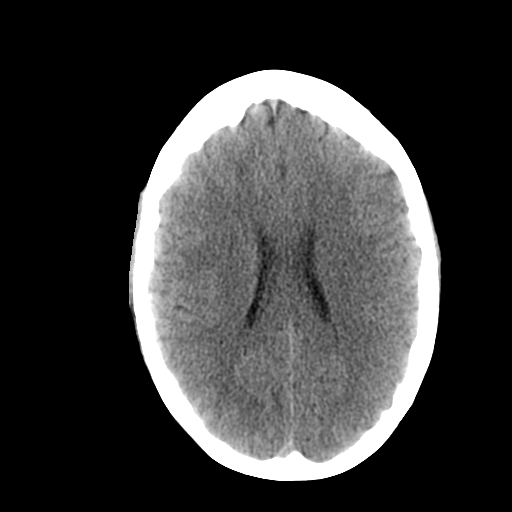
[im 19/36  bone]
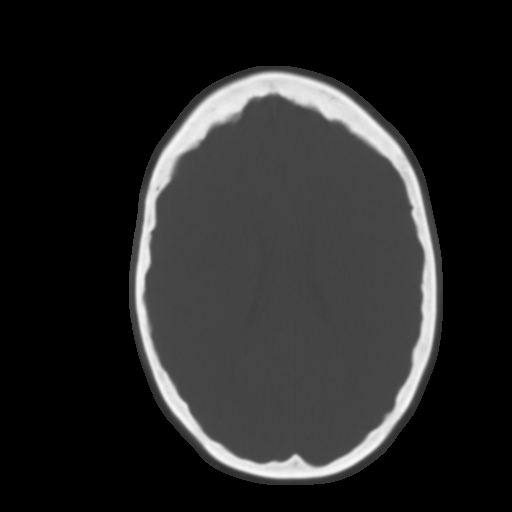
[im 21/36  brain]
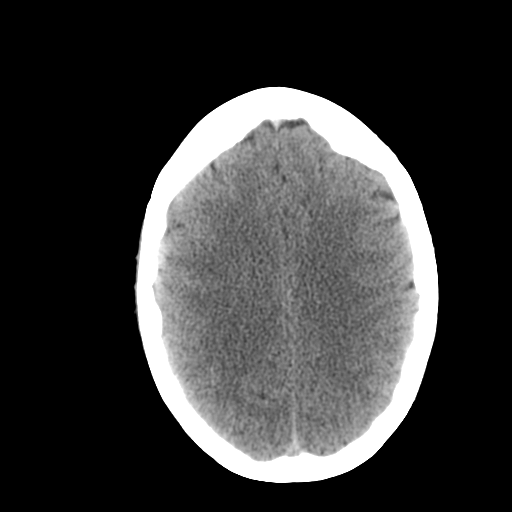
[im 23/36  brain]
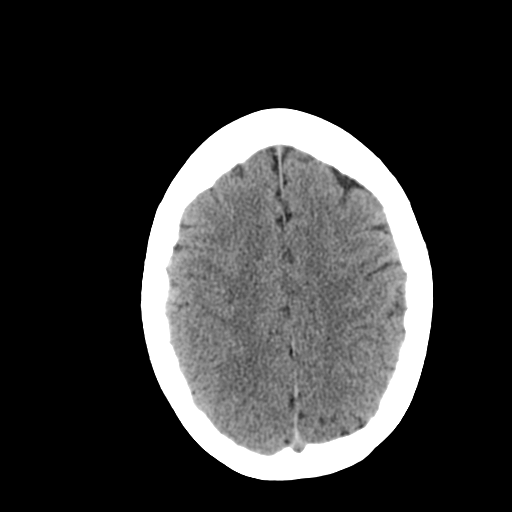
[im 26/36  brain]
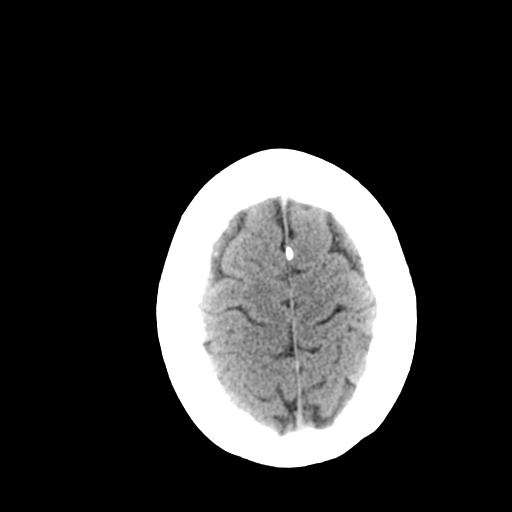
[im 27/36  brain]
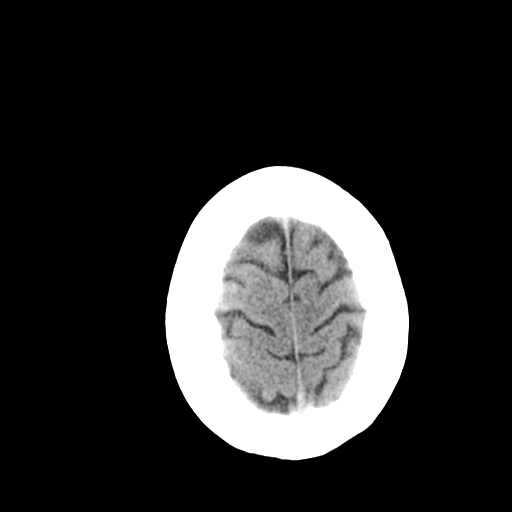
[im 27/36  bone]
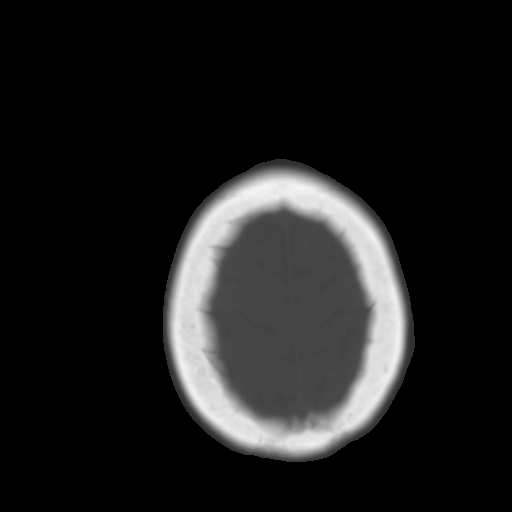
[im 29/36  brain]
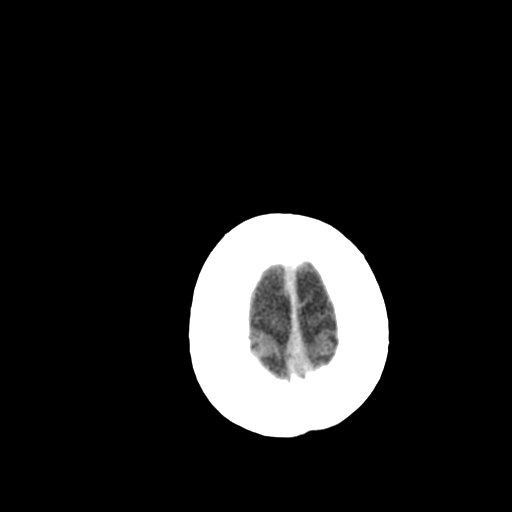
[im 32/36  brain]
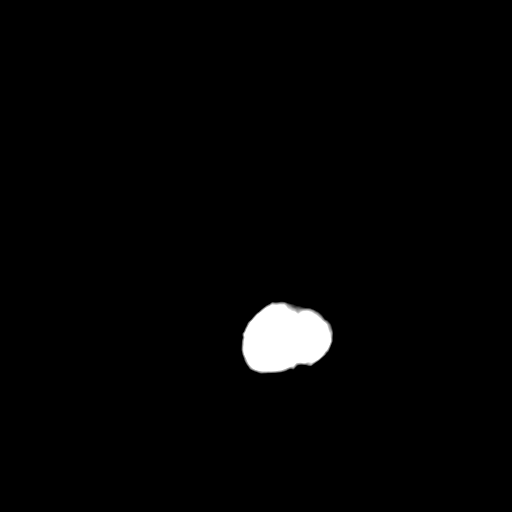
[im 34/36  brain]
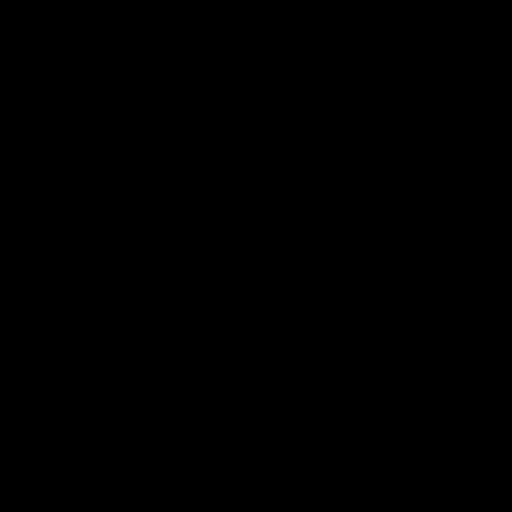

[16 of 30 positions shown; findings below may reference images not displayed]

FINDINGS: There is no evidence of brain mass, brain hemorrhage, or
acute infarction.

The ventricular system is normal size and shape.  There is no
evidence of shift of midline structures, parenchymal lesion, or
subdural or epidural hematoma.

The calvarium is intact.  Mastoids are well aerated.  No sinusitis
is evident.
IMPRESSION: There is no evidence of brain mass, brain hemorrhage, or acute
infarction.

No acute or active process is seen.  No skull lesion is evident.
No sinusitis is evident.

## 2009-12-08 IMAGING — NM NM MYOCAR MULTI W/SPECT W/WALL MOTION & EF
2 series · 12 of 12 positions shown · non-contrast
Comparison: [DATE]

CLINICAL DATA: Chest pain, family history heart disease

MYOCARDIAL IMAGING WITH SPECT (REST AND PHARMACOLOGIC-STRESS - 2
DAY PROTOCOL)
GATED LEFT VENTRICULAR WALL MOTION STUDY
LEFT VENTRICULAR EJECTION FRACTION
TECHNIQUE: Standard myocardial SPECT imaging was performed after
intravenous injection of 25.2 mCi [GT] Myoview at rest.  On a
different day, intravenous infusion of  Lexiscan was performed
under supervision of the Cardiology staff.  At peak effect of the
drug, 30.3 mCi [GT] Myoview was injected intravenously and
standard myocardial SPECT imaging was performed.  Quantitative
gated imaging was also performed to evaluate left ventricular wall
motion and estimate left ventricular ejection fraction.

[Series 1: cs cardiac tc hi dose · 6.41mm/px · 6 of 512 frames shown]
[frame 43/512]
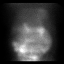
[frame 128/512]
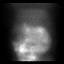
[frame 214/512]
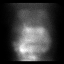
[frame 299/512]
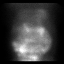
[frame 384/512]
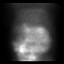
[frame 470/512]
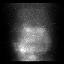

[Series 1: cr cardiac tc low dose · 6.41mm/px · 6 of 64 frames shown]
[frame 6/64]
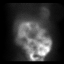
[frame 16/64]
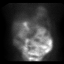
[frame 27/64]
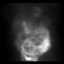
[frame 38/64]
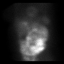
[frame 48/64]
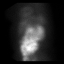
[frame 59/64]
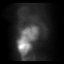

[12 of 12 positions shown; findings below may reference images not displayed]

FINDINGS: Myocardial perfusion SPECT images obtained following pharmacologic
stress demonstrate minimal apical thinning.
No focal myocardial perfusion defects otherwise identified.
On resting exam, tiny perfusion defect is seen at the anteroseptal
portion of left ventricular apex, uncertain significance.
This area is normal on the resting images.
No pulmonary to of tracer.

Mildly elevated left ventricle ejection fraction of 80% is
calculated on gated SPECT images following pharmacologic stress.
This is derived from an end-diastolic volume calculation of 60 ml
and end-systolic volume of 12 ml.
Left ventricular wall motion is normal on cine analysis of gated
SPECT images following pharmacologic stress.
IMPRESSION: No evidence of Lexiscan-induced myocardial perfusion abnormality.
Mildly elevated left ventricular ejection fraction of 80%.
Left ventricular ejection fractions may be elevated in patients
with low calculated end diastolic volumes, as well as with
hyperdynamic states, valvular disease, restrictive pericarditis,
and IHSS.
Normal left ventricular wall motion.

## 2010-03-03 ENCOUNTER — Ambulatory Visit (HOSPITAL_COMMUNITY)
Admission: RE | Admit: 2010-03-03 | Discharge: 2010-03-03 | Payer: Self-pay | Source: Home / Self Care | Attending: Obstetrics & Gynecology | Admitting: Obstetrics & Gynecology

## 2010-03-03 IMAGING — MG MM DIGITAL SCREENING
5 series · 5 of 5 positions shown · non-contrast
Comparison: none

DG SCREEN MAMMOGRAM BILATERAL
Bilateral CC and MLO view(s) were taken.

DIGITAL SCREENING MAMMOGRAM WITH CAD:
There are scattered fibroglandular densities.  No masses or malignant type calcifications are 
identified.  Compared with prior studies.
Images were processed with CAD.

[L CC]
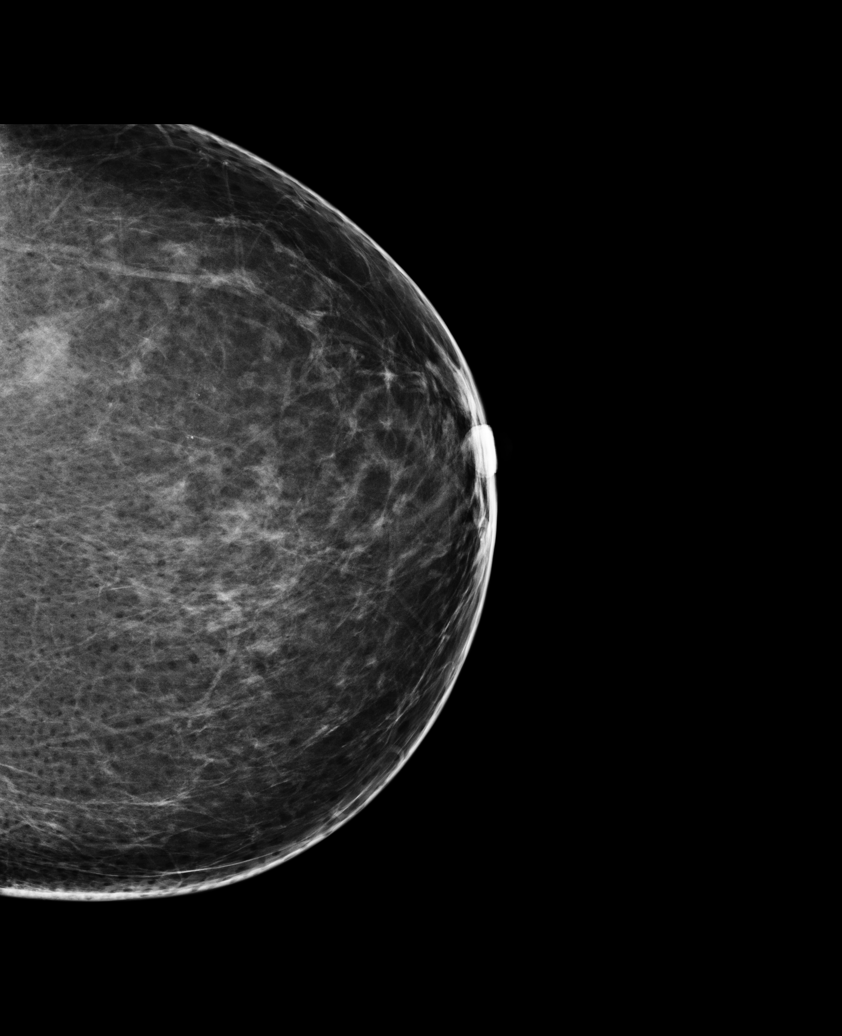

[L MLO (1 of 2)]
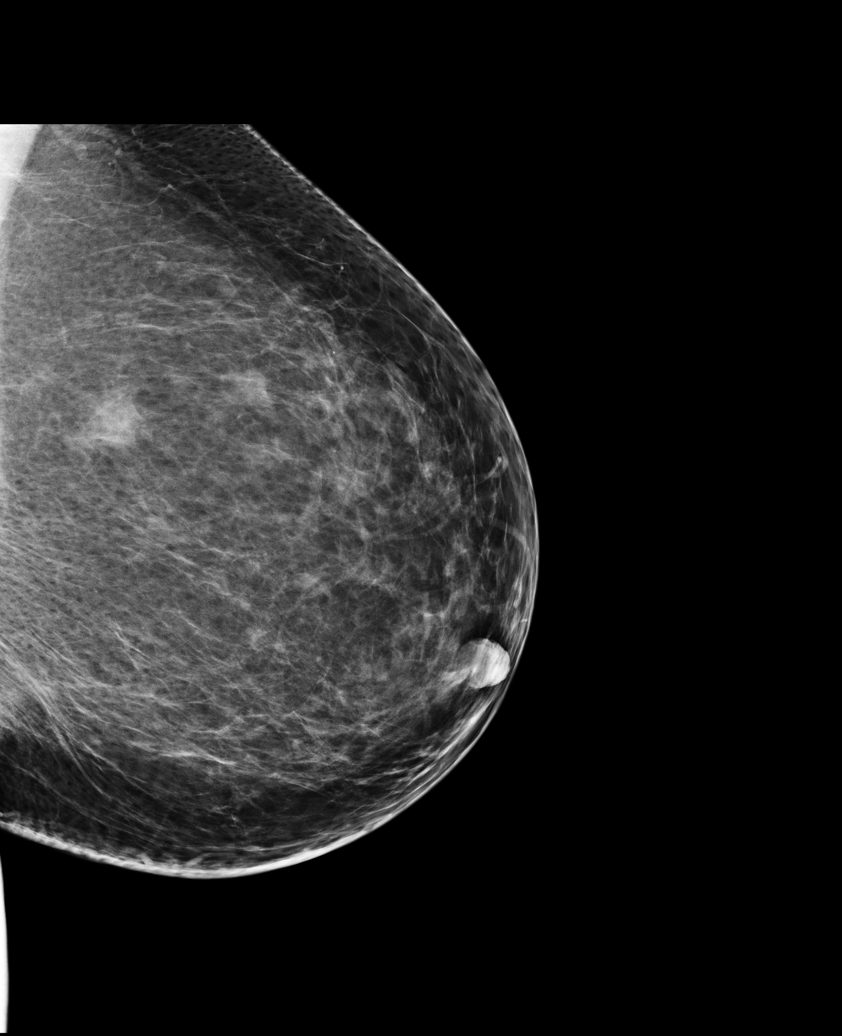

[R CC]
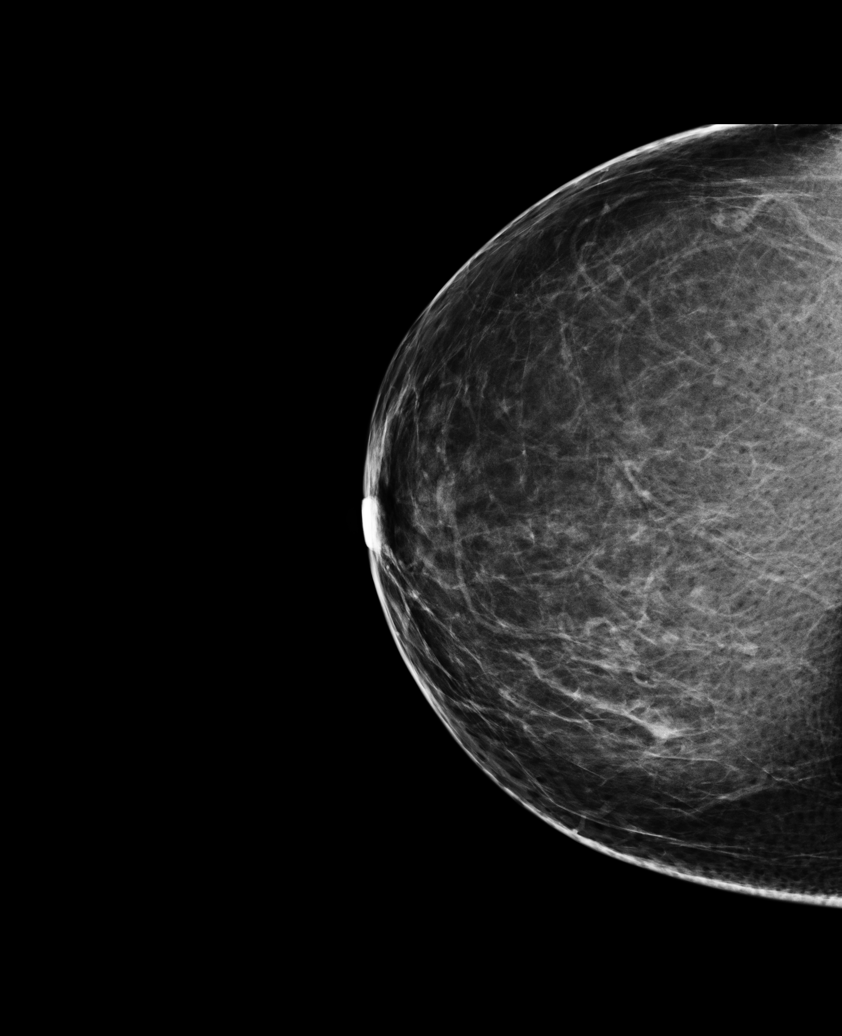

[R MLO]
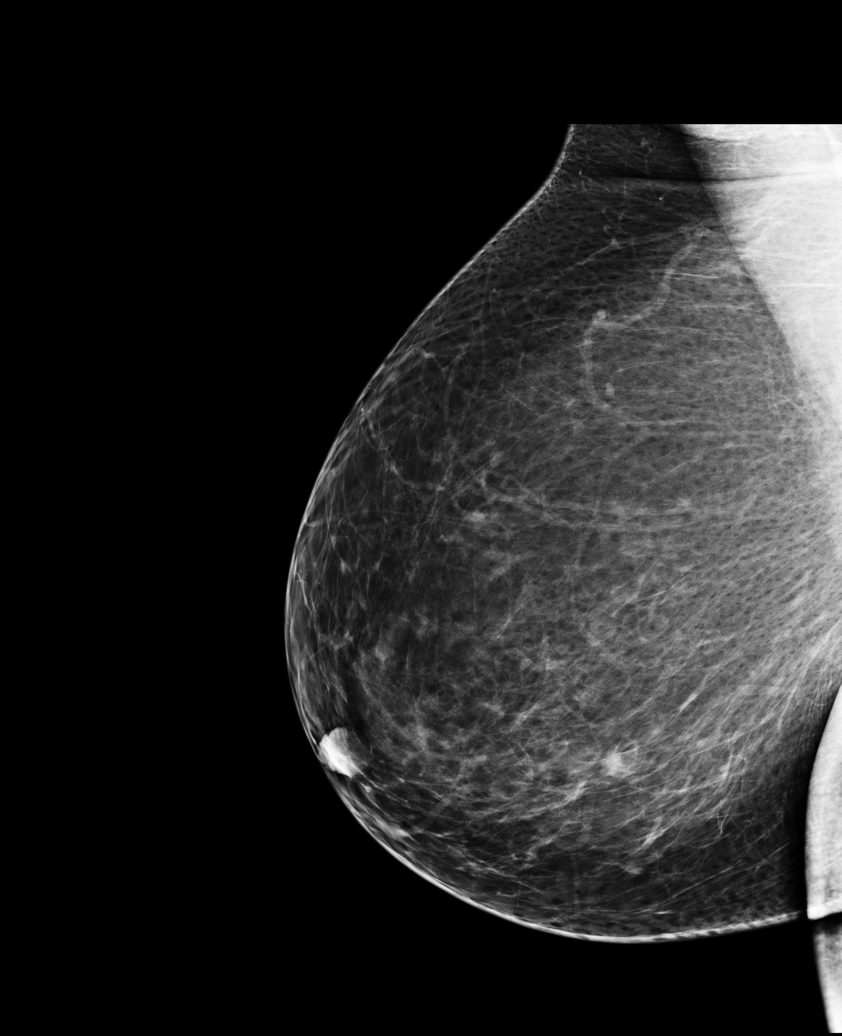

[L MLO (2 of 2)]
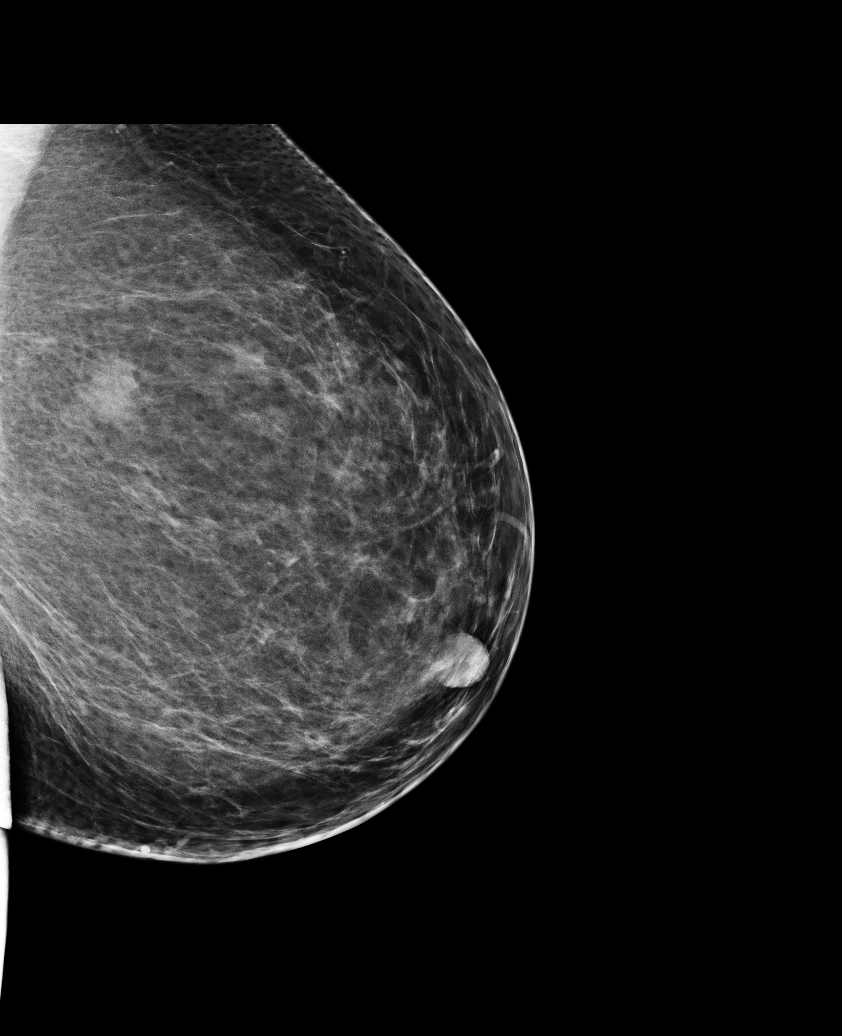

[5 of 5 positions shown; findings below may reference images not displayed]

IMPRESSION: No specific mammographic evidence of malignancy.  Next screening mammogram is recommended in one 
year.

A result letter of this screening mammogram will be mailed directly to the patient.

ASSESSMENT: Negative - BI-RADS 1

Screening mammogram in 1 year.
,

## 2010-03-06 ENCOUNTER — Emergency Department (HOSPITAL_COMMUNITY)
Admission: EM | Admit: 2010-03-06 | Discharge: 2010-03-06 | Payer: Self-pay | Source: Home / Self Care | Admitting: Emergency Medicine

## 2010-03-06 IMAGING — CR DG CERVICAL SPINE COMPLETE 4+V
6 series · 6 of 6 positions shown · non-contrast
Comparison: None.

CLINICAL DATA: Left arm pain

CERVICAL SPINE - COMPLETE 4+ VIEW

[view not recorded (1 of 6)]
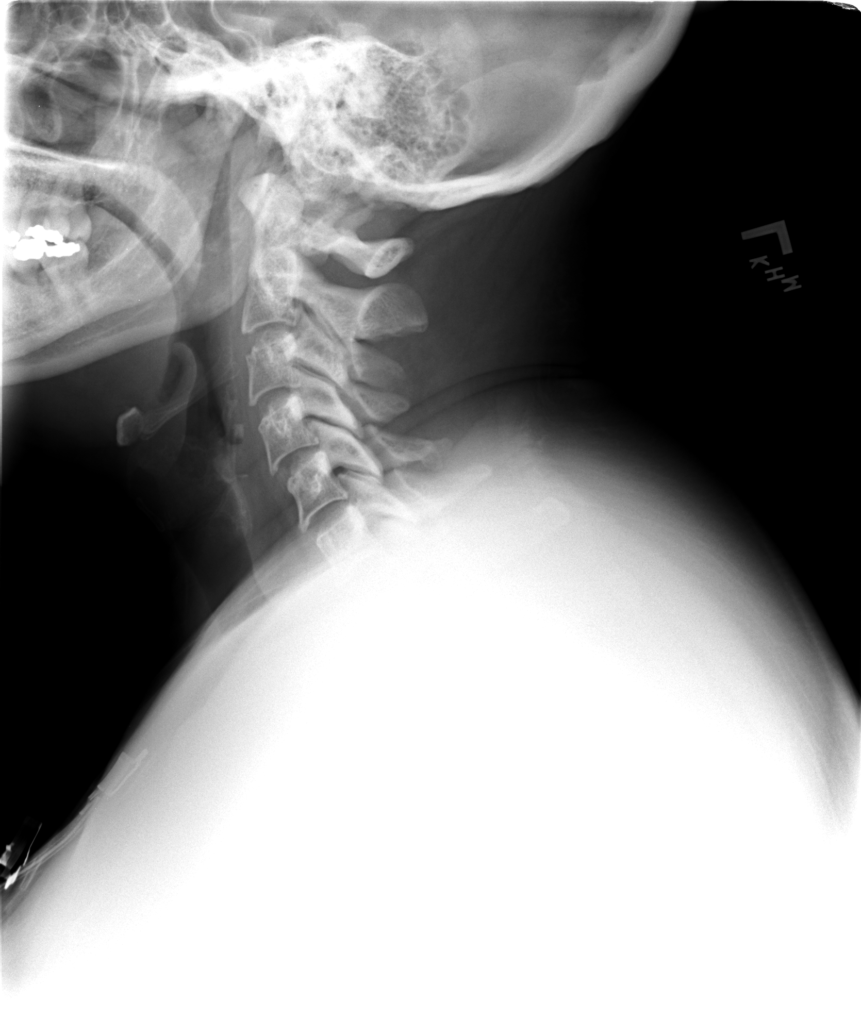

[view not recorded (2 of 6)]
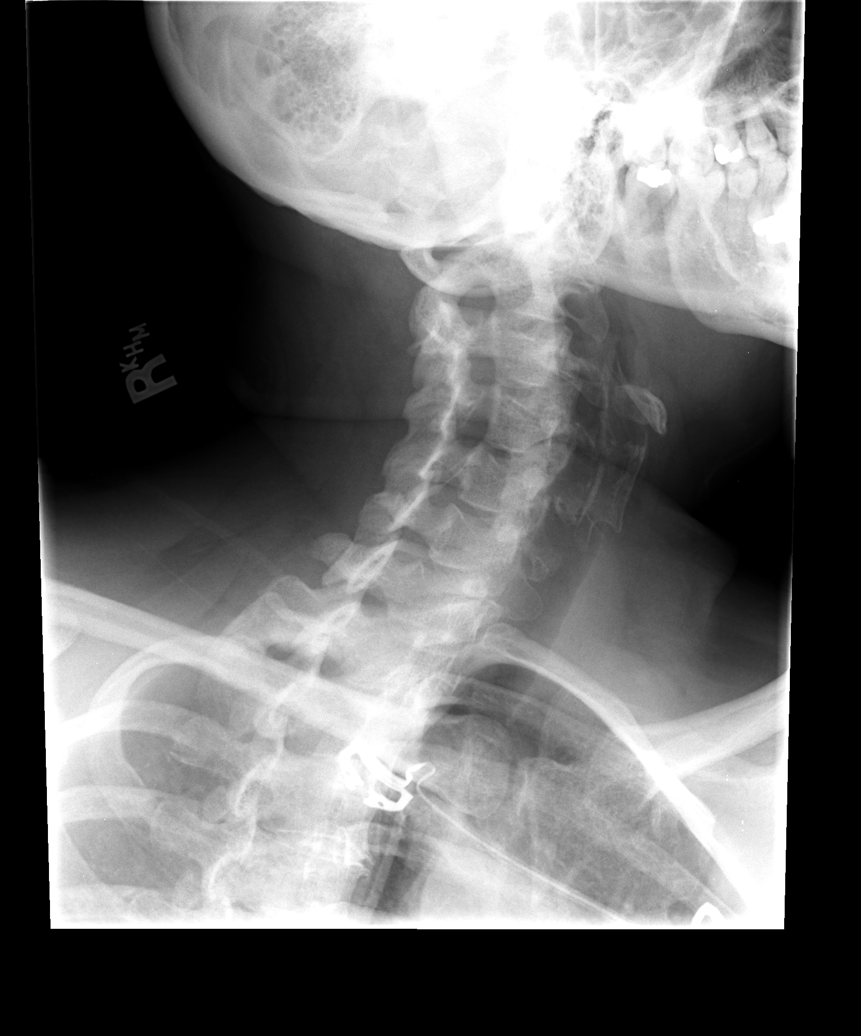

[view not recorded (3 of 6)]
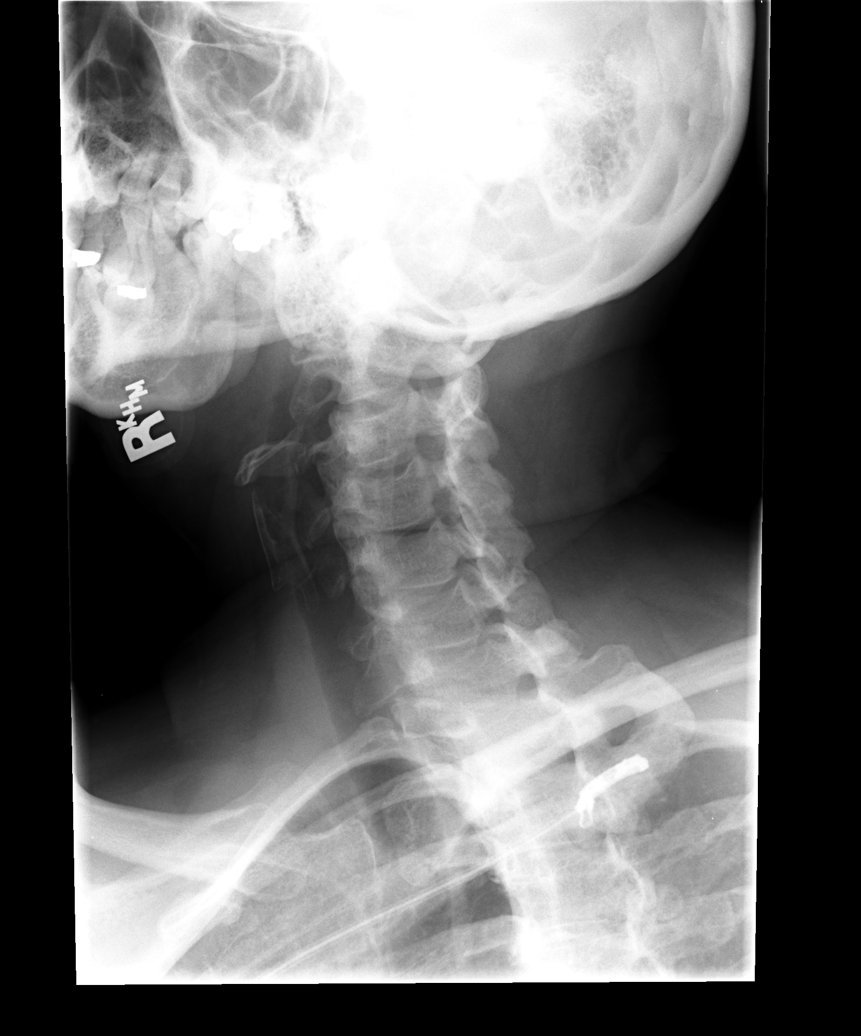

[view not recorded (4 of 6)]
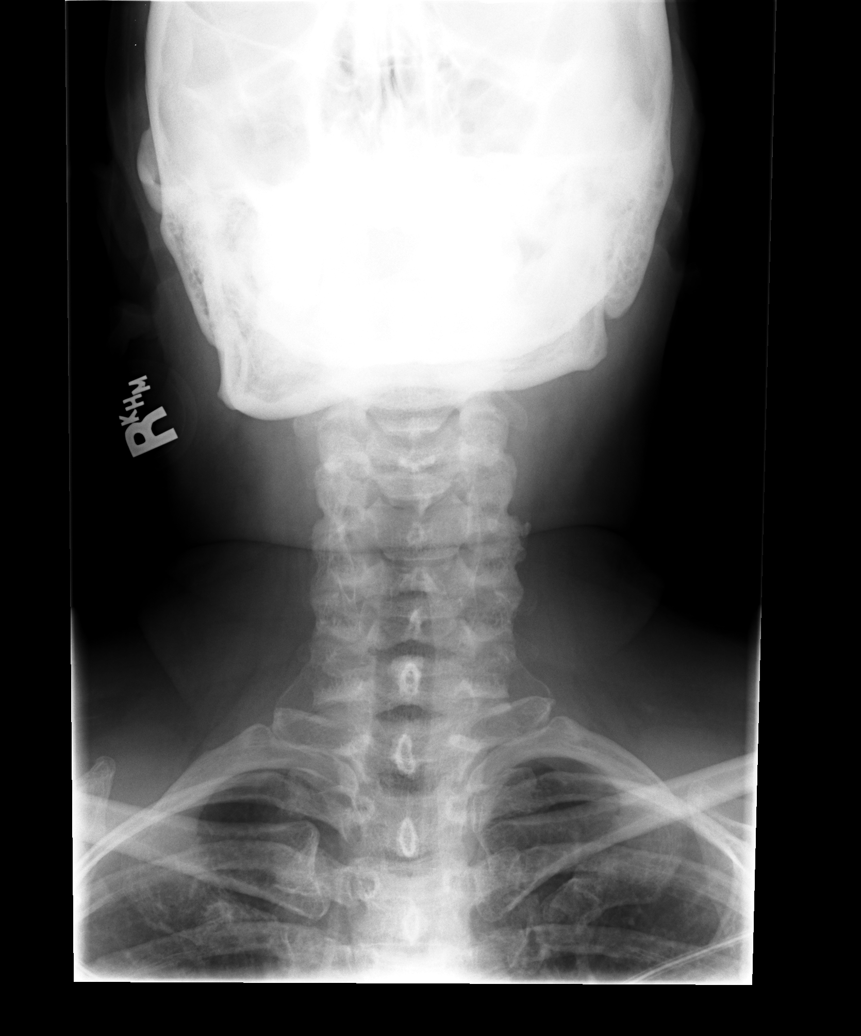

[view not recorded (5 of 6)]
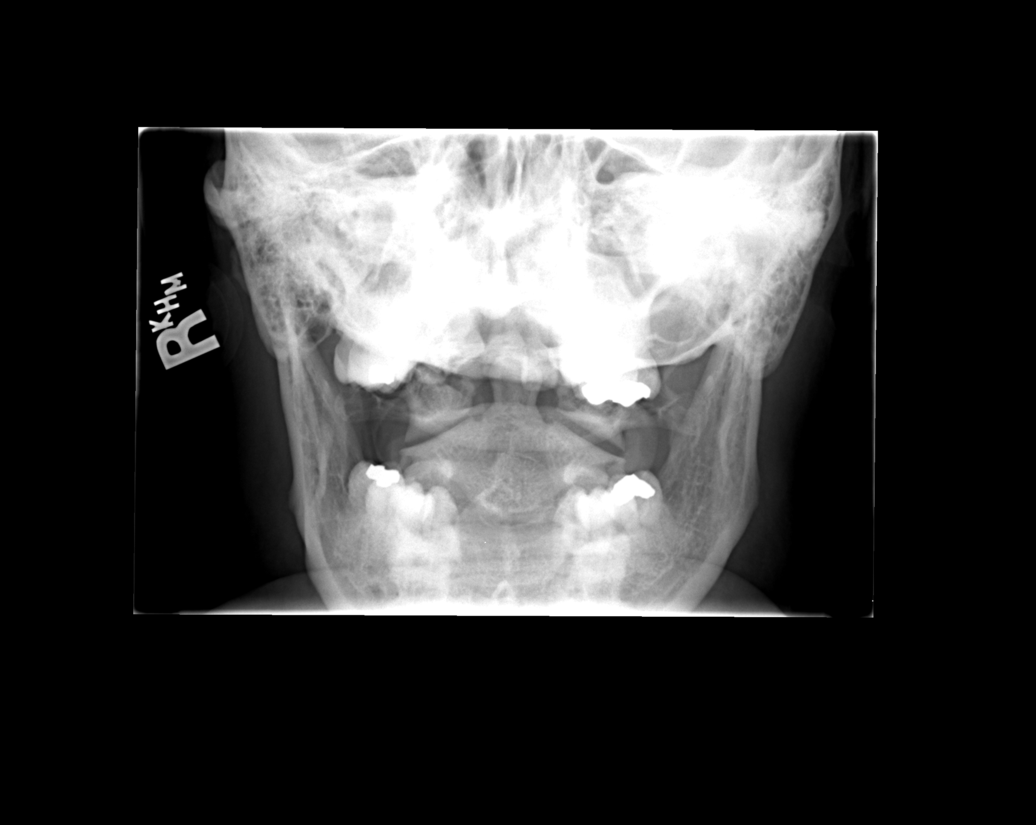

[view not recorded (6 of 6)]
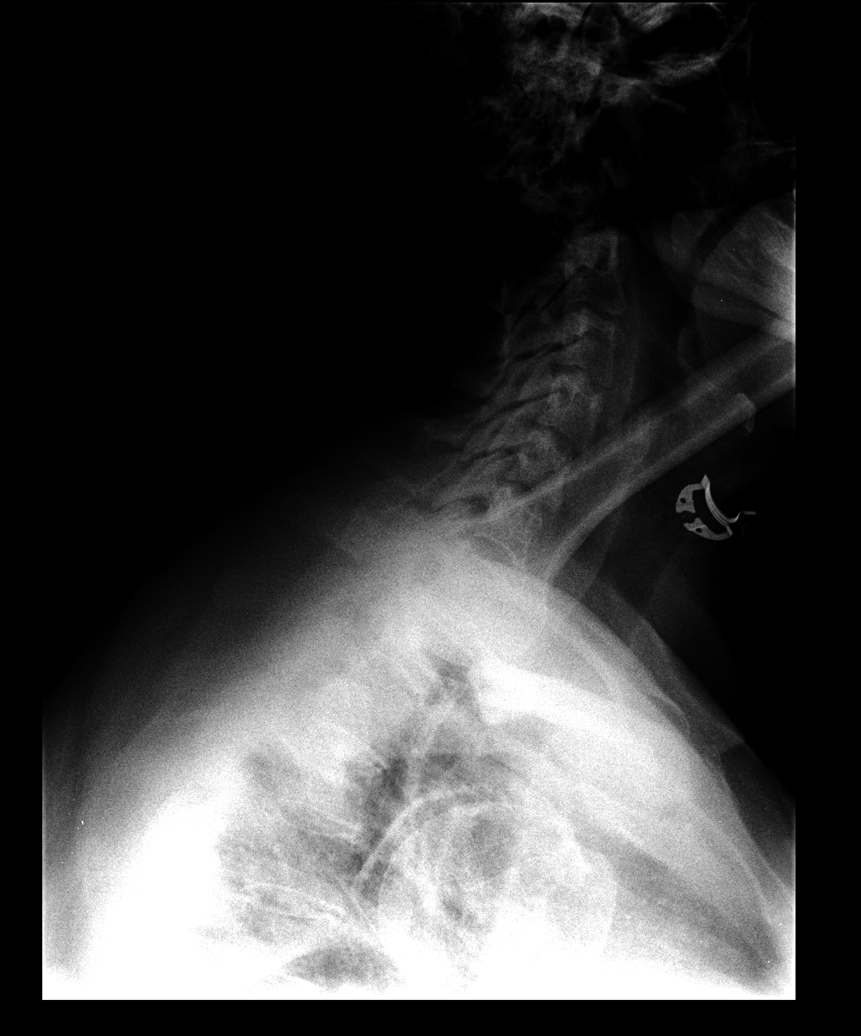

[6 of 6 positions shown; findings below may reference images not displayed]

FINDINGS: No prevertebral soft tissue swelling.  Normal alignment
of the cervical vertebral bodies.  Normal spinal laminar line.
Oblique projections demonstrate potential neural foraminal
narrowing on the left at C5-C6 and C6-C7.  Open mouth odontoid view
is normal.
IMPRESSION: 1.  No acute findings of cervical spine.
2.  Potential neural foraminal narrowing on the left at C5-C6 and
C6-C7.

## 2010-03-06 IMAGING — CR DG CHEST 2V
2 series · 2 of 2 positions shown · non-contrast
Comparison: Chest radiograph [DATE]

CLINICAL DATA: Left arm pain, chest pain

CHEST - 2 VIEW

[view not recorded (1 of 2)]
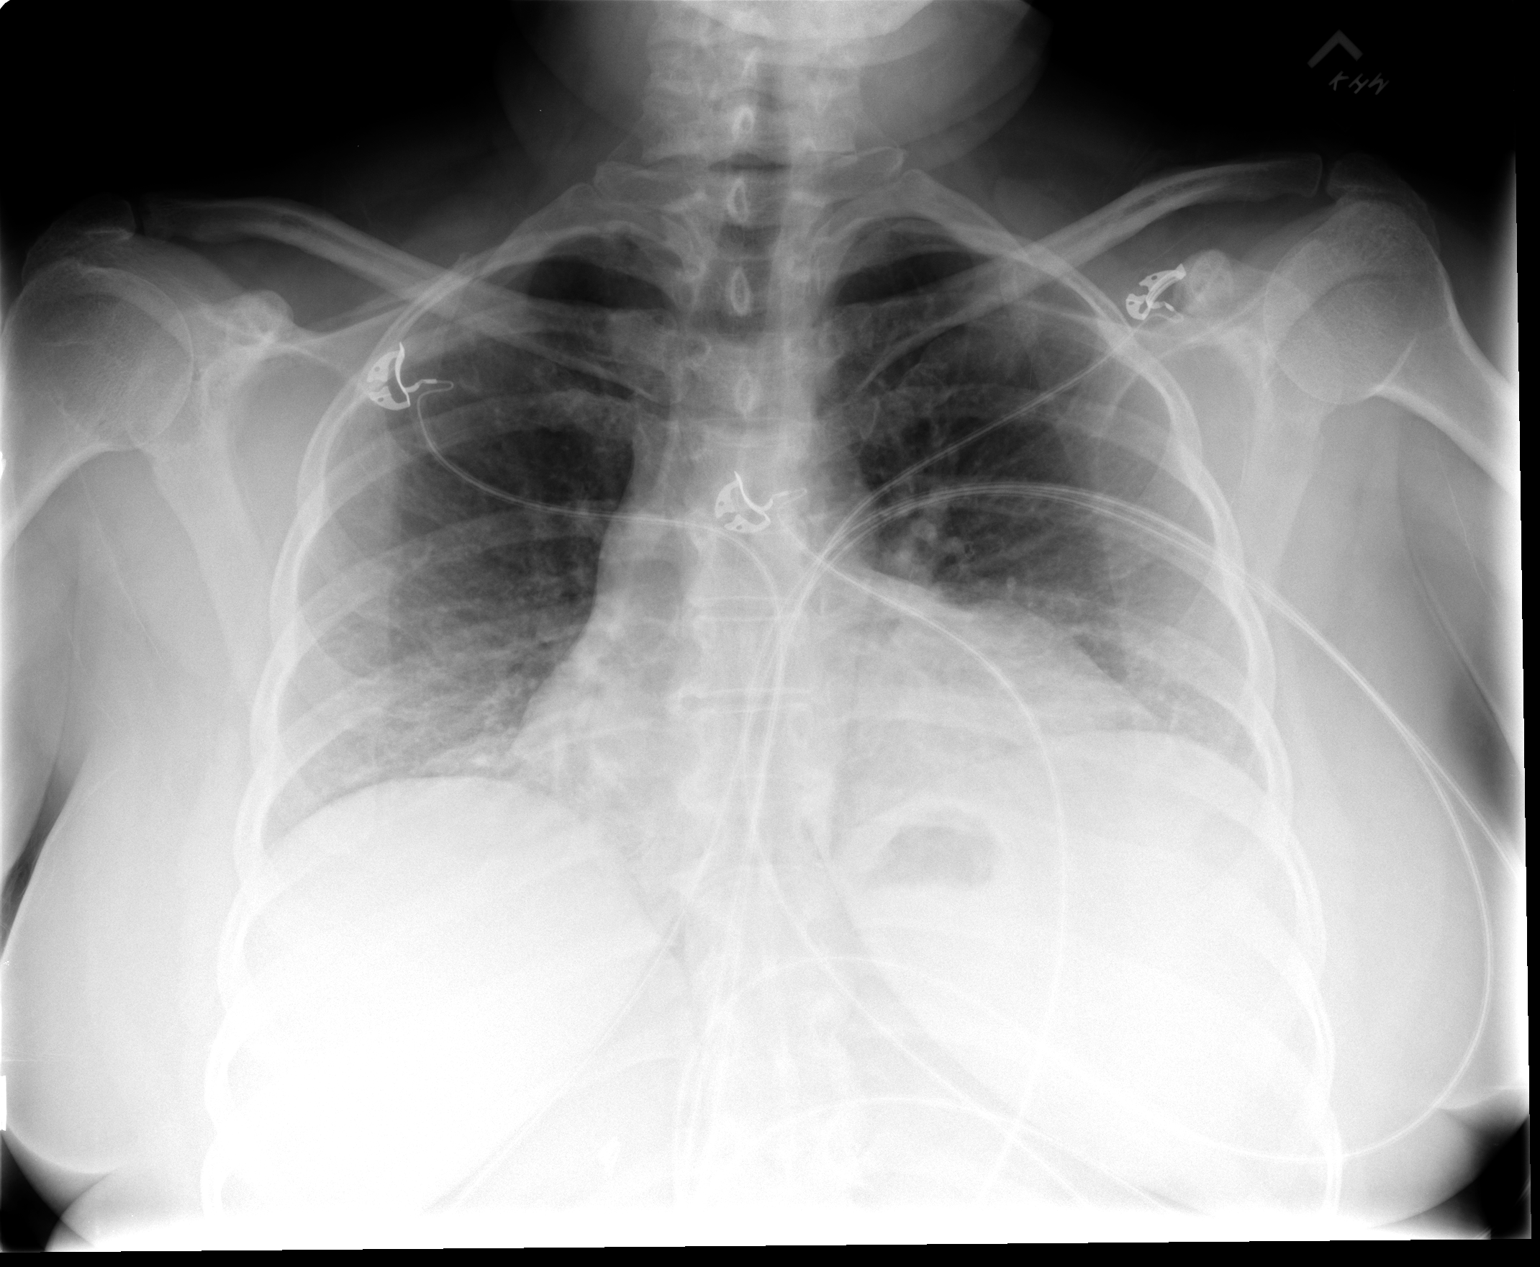

[view not recorded (2 of 2)]
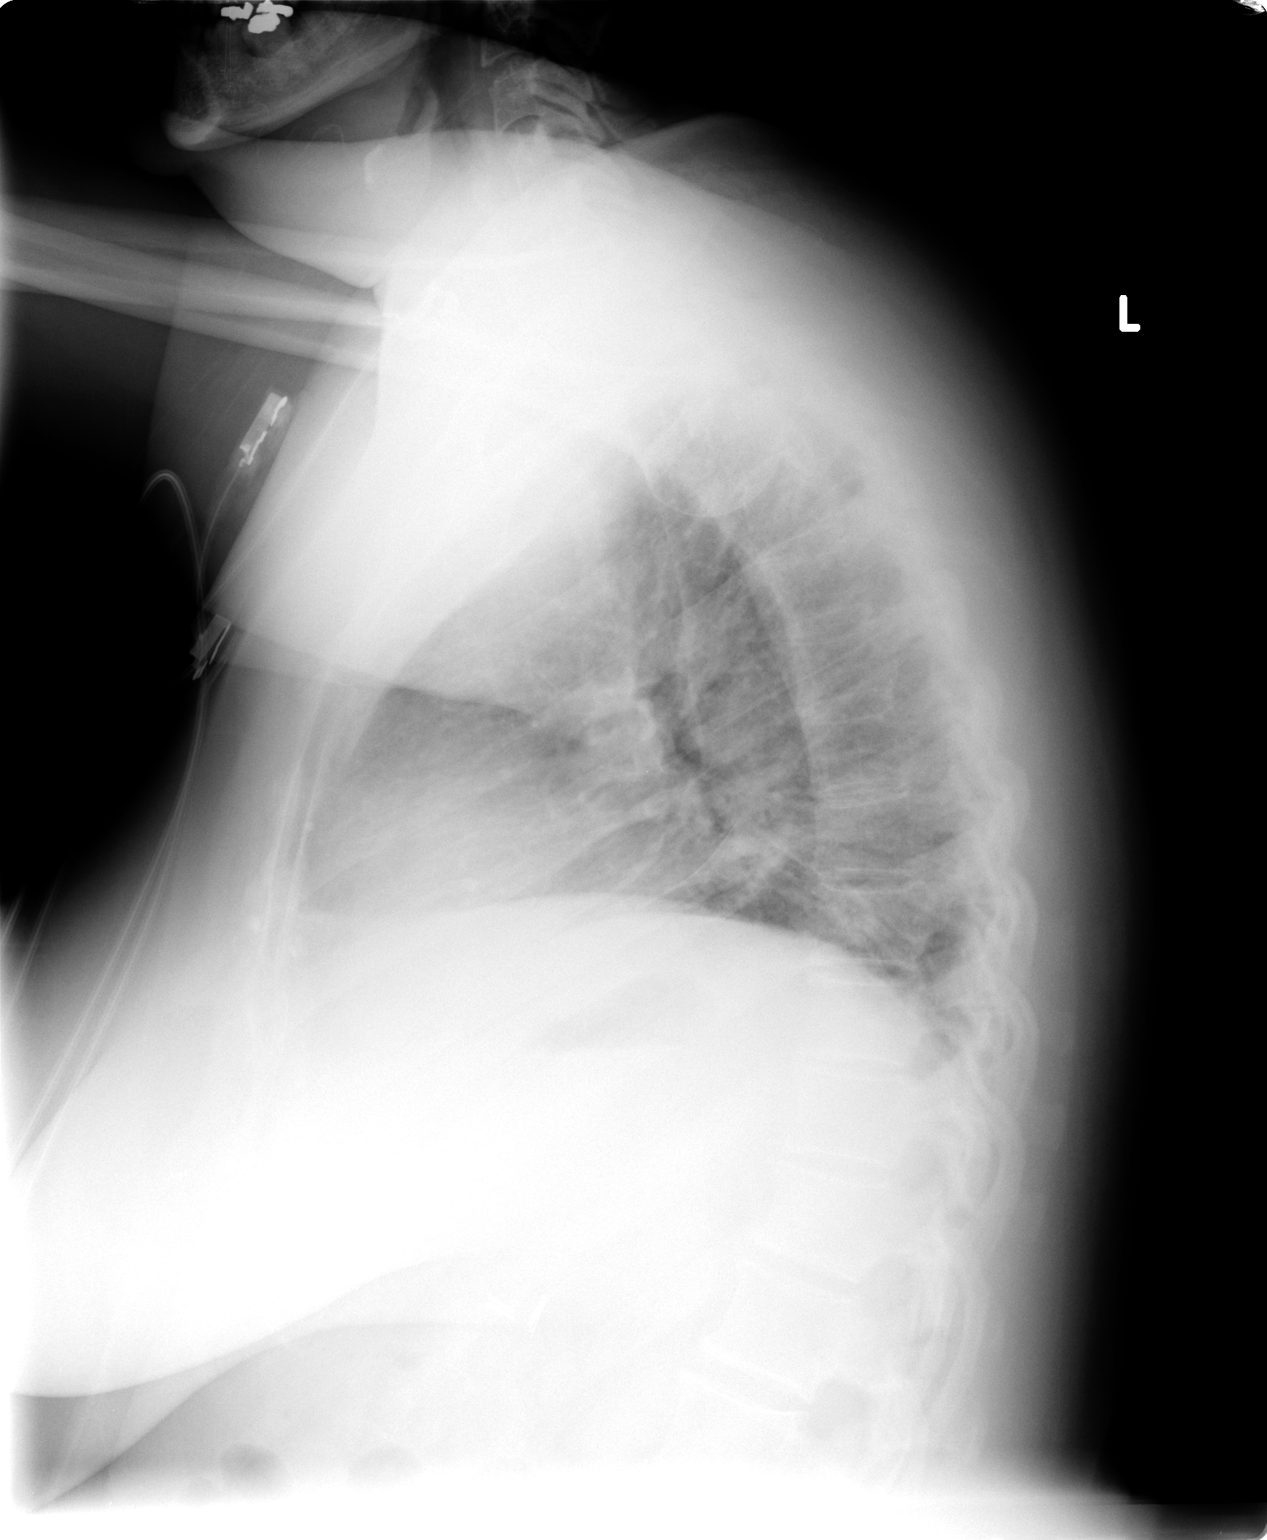

[2 of 2 positions shown; findings below may reference images not displayed]

FINDINGS: Stable enlarged heart silhouette.  There are low lung
volumes.  There is mild basilar atelectasis.  No evidence effusion,
infiltrate, or pneumothorax.
IMPRESSION: Cardiomegaly and low lung volumes.

## 2010-03-26 ENCOUNTER — Encounter: Payer: Self-pay | Admitting: Obstetrics & Gynecology

## 2010-04-04 NOTE — Letter (Signed)
Summary: Recall Colonoscopy/Endoscopy, Change to Office Visit  [facility]  [address]   [place], [place] [address]   Phone: [phone]  Fax: [phone]      June 16, 2009   Kaitlin [name] [address] [place] [address] [place], [place]  [address] 07/16/1955   Dear Ms. STINNETTE,   According to our records, it is time for you to schedule a Colonoscopy/Endoscopy. However, after reviewing your medical record, we recommend an office visit in order to determine your need for a repeat procedure.  Please call [phone] at your convenience to schedule an office visit. If you have any questions or concerns, please feel free to contact our office.   Sincerely,   Cloria Spring LPN  [facility] Ph: [phone]   Fax: [phone]

## 2010-05-15 LAB — DIFFERENTIAL
Basophils Relative: 1 % (ref 0–1)
Lymphs Abs: 2 10*3/uL (ref 0.7–4.0)
Monocytes Absolute: 0.4 10*3/uL (ref 0.1–1.0)
Monocytes Relative: 6 % (ref 3–12)
Neutro Abs: 4.3 10*3/uL (ref 1.7–7.7)
Neutrophils Relative %: 63 % (ref 43–77)

## 2010-05-15 LAB — BASIC METABOLIC PANEL
Calcium: 10.5 mg/dL (ref 8.4–10.5)
GFR calc Af Amer: >60 mL/min (ref >60)
GFR calc non Af Amer: >60 mL/min (ref >60)
Glucose, Bld: 109 mg/dL — ABNORMAL HIGH (ref 70–99)

## 2010-05-15 LAB — CK TOTAL AND CKMB (NOT AT ARMC): CK, MB: 2.1 ng/mL (ref 0.3–4.0)

## 2010-05-15 LAB — CBC
MCV: 83.6 fL (ref 78.0–100.0)
Platelets: 274 10*3/uL (ref 150–400)
RDW: 12.6 % (ref 11.5–15.5)
WBC: 6.8 10*3/uL (ref 4.0–10.5)

## 2010-05-22 LAB — URINALYSIS, ROUTINE W REFLEX MICROSCOPIC
Glucose, UA: NEGATIVE mg/dL
Ketones, ur: NEGATIVE mg/dL
pH: 5 (ref 5.0–8.0)

## 2010-05-22 LAB — COMPREHENSIVE METABOLIC PANEL
AST: 29 U/L (ref 0–37)
Albumin: 4.5 g/dL (ref 3.5–5.2)
Calcium: 10.3 mg/dL (ref 8.4–10.5)
Creatinine, Ser: 0.61 mg/dL (ref 0.4–1.2)
GFR calc Af Amer: >60 mL/min (ref >60)
GFR calc non Af Amer: >60 mL/min (ref >60)

## 2010-05-22 LAB — CK: Total CK: 116 U/L (ref 7–177)

## 2010-05-22 LAB — DIFFERENTIAL
Eosinophils Relative: 2 % (ref 0–5)
Lymphocytes Relative: 28 % (ref 12–46)
Lymphs Abs: 1.8 10*3/uL (ref 0.7–4.0)
Monocytes Absolute: 0.4 10*3/uL (ref 0.1–1.0)

## 2010-05-22 LAB — URINE CULTURE: Colony Count: 25000

## 2010-05-22 LAB — POCT CARDIAC MARKERS
Troponin i, poc: <0.05 ng/mL (ref 0.00–0.09)
Troponin i, poc: <0.05 ng/mL (ref 0.00–0.09)

## 2010-05-22 LAB — CBC
MCHC: 33.6 g/dL (ref 30.0–36.0)
RDW: 13.1 % (ref 11.5–15.5)

## 2010-06-20 ENCOUNTER — Encounter: Payer: Self-pay | Admitting: Internal Medicine

## 2010-07-03 ENCOUNTER — Encounter: Payer: Self-pay | Admitting: Cardiology

## 2010-07-05 ENCOUNTER — Encounter: Payer: Self-pay | Admitting: Gastroenterology

## 2010-07-05 ENCOUNTER — Ambulatory Visit (INDEPENDENT_AMBULATORY_CARE_PROVIDER_SITE_OTHER): Payer: BC Managed Care – PPO | Admitting: Gastroenterology

## 2010-07-05 VITALS — BP 129/80 | HR 83 | Temp 98.5°F | Ht 65.0 in | Wt 197.8 lb

## 2010-07-05 DIAGNOSIS — K589 Irritable bowel syndrome without diarrhea: Secondary | ICD-10-CM

## 2010-07-05 DIAGNOSIS — Z860101 Personal history of adenomatous and serrated colon polyps: Secondary | ICD-10-CM

## 2010-07-05 DIAGNOSIS — Z86010 Personal history of colonic polyps: Secondary | ICD-10-CM | POA: Insufficient documentation

## 2010-07-05 NOTE — Assessment & Plan Note (Signed)
Hx: adenomatous polyps removed. Last TCS 2008 TCS June 2012-[id number]. If no polyps removed then consider a 10 year interval.

## 2010-07-05 NOTE — Progress Notes (Signed)
  Subjective:    Patient ID: Kaitlin [name], female    DOB: [birth year], 55 y.o.   MRN: [id number] PCP: Gerda Diss  HPI Pt having palpitations and heart racing at work-HR 250, BP 200/110. Ready to have TCS. No rectal bleeding, melena, or hematemesis. Woke up during procedure. NEEDS MORE MEDICINE FOR SEDATION FOR TCS. STAYS CONSTIPATED. MLX helps if she takes. Wants the pills like last time. No problems.   Past Medical History  Diagnosis Date  . BMI 30.0-30.9,adult 2008 182 LBS    2009 194 LBS  . Helicobacter pylori gastritis AUG 2008 PREVPAC: nausea-->HELIDAC: GI UPSET    HB 14.1 NL HFP H. PYLORI STOOL Ag NEG  . Colon polyp APR 2008  . Anxiety   . HTN (hypertension)   . Hypothyroidism   . Hyperlipemia   . Irritable bowel syndrome    Past Surgical History  Procedure Date  . Colonoscopy APR 2008    AC/Moca SIMPLE ADENOMA  . Upper gastrointestinal endoscopy AUG 2008 PAIN/NAUSEA    H. PYLORI  . Cholecystectomy    No Known Allergies  Current Outpatient Prescriptions  Medication Sig Dispense Refill  . ALPRAZolam (XANAX) 1 MG tablet Take 1 mg by mouth at bedtime as needed.        . enalapril (VASOTEC) 20 MG tablet Take 20 mg by mouth daily.        Marland Kitchen levothyroxine (SYNTHROID, LEVOTHROID) 50 MCG tablet Take 50 mcg by mouth daily.        Marland Kitchen lovastatin (MEVACOR) 20 MG tablet Take 20 mg by mouth at bedtime.        . verapamil (COVERA HS) 240 MG (CO) 24 hr tablet Take 240 mg by mouth at bedtime.         Review of Systems 2008: 179 LBS    Objective:   Physical Exam  Constitutional: She is oriented to person, place, and time. She appears well-developed and well-nourished. No distress.  HENT:  Head: Normocephalic and atraumatic.  Cardiovascular: Normal rate and regular rhythm.   Pulmonary/Chest: Effort normal and breath sounds normal.  Abdominal: Soft. Bowel sounds are normal. There is no tenderness.  Neurological: She is alert and oriented to person, place, and time.  Psychiatric:   ANXIOUS MOOD          Assessment & Plan:

## 2010-07-05 NOTE — Assessment & Plan Note (Signed)
Sx stable. Use MLX prn. OPV in 12 mos.

## 2010-07-06 NOTE — Progress Notes (Signed)
Reminder in epic for pt to follow up in one year °

## 2010-07-12 ENCOUNTER — Encounter: Payer: Self-pay | Admitting: Gastroenterology

## 2010-07-18 NOTE — Assessment & Plan Note (Signed)
NAMEMarland Kitchen  DEMIYAH, FISCHBACH               CHART#:  [id number]   DATE:  10/28/2006                       DOB:  [birth year]   CHIEF COMPLAINT:  Work-in for abdominal pain, hot and cold flashes,  headache and gastritis.   SUBJECTIVE:  Ms. Coppens is a 55 year old, African-American female. She  has been evaluated by Dr. Cira Servant previously, last office visit was on  October 08, 2006. She has a history of dyspepsia. She underwent EGD,  dyspepsia, abdominal pain, and nausea. She underwent EGD by Dr. Cira Servant on  October 16, 2006. She was found to have a nodular-appearing antral mucosa  with occasional erosions. Biopsies showed H. pylori gastritis otherwise  normal exam. She was started on Prevpac; however, she was only able to  take 2 days worth. She felt extremely sick as well as fatigued. She  complains of chills, hot and cold flashes. She complains of cramp-like  pain to her epigastrium and left upper quadrant. She complains of  increased belching and flatus. She complains of increased urinary  frequency. She has also noticed loose stools about 4 times a day. She  recently has had a cardiac workup which was benign. She also had a  colonoscopy back on June 14, 2006 by Dr. Cira Servant which she had 2 tubular  adenomas removed, one from the ascending colon and one from the sigmoid  colon. She also has a history of constipation which has responded to  MiraLax and fiber.   CURRENT MEDICATIONS:  See the list from October 28, 2006.   ALLERGIES:  No known drug allergies.   OBJECTIVE:  VITAL SIGNS:  Weight 179 pounds, weight 65 inches,  temperature 98.2. Blood pressure 118/82 and pulse 84  GENERAL:  Ms. Wrisley is a well-developed, well-nourished, African-  American female in no acute distress.  HEENT:  Sclera clear, nonicteric, conjunctivae pink. Oropharynx pink and  moist without lesions.  CHEST:  Heart regular rate and rhythm. Normal S1, S2. Without murmurs,  rubs or gallops.  ABDOMEN:  Positive bowel sounds  x4. No bruits auscultated.  Soft,  nontender, nondistended without palpable mass or hepatosplenomegaly. No  rebound tenderness or guarding.  EXTREMITIES:  Without clubbing or edema bilaterally.  SKIN:  Warm and dry without any rash or jaundice.   ASSESSMENT:  Ms. Arriola is a 55 year old female with H. pylori  gastritis, but was unable to tolerate Prevpac. We discussed continuing  with completion of treatment versus placing her on b.i.d. PPI until her  symptoms had to treat her gastritis and eradicate her H. Pylori  at a  later date as she seems to be having difficulty tolerating treatment.  She also has increased urinary frequency and her symptoms could be  exacerbated by a urinary tract infection. Her epigastric and left upper  quadrant pain could be less likely related to pancreatitis and it is  possible that she could have a functional dyspepsia/abdominal pain.   PLAN:  1. CBC, LFTs, amylase, lipase, and urinalysis.  2. Prevacid 30 mg b.i.d. and I have given her samples as well as a      prescription for b.i.d. dosing #60 which she refills.  3. She is opting to hold the Prevpac for the moment and we will      revisit this at a date in the near future.  4. GERD literature given  for review.  5. Offered pain medications and patient declined.  6. I will be contacting her with her lab results tomorrow.       Lorenza Burton, N.P.  Electronically Signed     Kassie Mends, M.D.  Electronically Signed    KJ/MEDQ  D:  10/29/2006  T:  10/29/2006  Job:  [id number]

## 2010-07-18 NOTE — Assessment & Plan Note (Signed)
NAMEMarland Kitchen  Kaitlin, [name]               CHART#:  [id number]   DATE:  02/14/2007                       DOB:  [birth year]   REFERRING PHYSICIAN:  Dr. Lubertha South.   PROBLEM LIST:  1. Hot and cold flashes.  2. H. pylori gastritis, treated.  3. Adenomatous polyp on colonoscopy in April 2008.  4. Anxiety.  5. Chronic abdominal pain and likely secondary irritable bowel      syndrome.  6. Hypertension.  7. Hypothyroidism.  8. Hyperlipidemia.  9. Cholecystectomy.   SUBJECTIVE:  Kaitlin [name] is a 55 year old female who complains of  abdominal pain three times a week. It lasts hours. She still complains  of pain in her left upper quadrant. She denies any vomiting or nausea.  Her appetite is great. She has one bowel movement per week pain. When  she does move her bowels, it is soft. Fiber makes her symptoms worse.   OBJECTIVE:  VITAL SIGNS:  Weight 183 pounds (up 4 pounds since August  2008), height 5 foot 4 inches, temperature 97.6, blood pressure 110/72,  pulse 72.  GENERAL:  No apparent distress. Alert and oriented x4.  LUNGS:  Clear to auscultation bilaterally.  CARDIOVASCULAR:  Regular rhythm, no murmur and normal S1 and S2.  ABDOMEN:  Bowel sounds are present. Soft and nontender, nondistended, no  rebound or guarding.   LABORATORY DATA:  H. pylori stools antigen negative in October 2008.   ASSESSMENT:  Kaitlin [name] is a 55 year old female who has intermittent  abdominal pain which is likely secondary to irritable bowel or  intolerances to certain food. Thank you for allowing me to see [name] in consultation. My recommendations follow.   RECOMMENDATIONS:  1. She should could continue her MiraLax and her Prevacid.  2. She has a follow up appointment to see me in six months. If she      needs assistance sooner, she may call and schedule an appointment.       Kassie Mends, M.D.  Electronically Signed     SM/MEDQ  D:  02/14/2007  T:  02/16/2007  Job:  [id number]   cc:   Donna Bernard, M.D.

## 2010-07-18 NOTE — Assessment & Plan Note (Signed)
NAMEMarland Kitchen  Kaitlin [name]               CHART#:  [id number]   DATE:  10/08/2006                       DOB:  [birth year]   REFERRING PHYSICIAN:  Donna [name], M.D.   PROBLEM LIST:  1. Adenomatous polyps found on colonoscopy in April 2008.  2. Anxiety.  3. Abdominal pain.  4. Hypertension.  5. Hypothyroidism.  6. Hyperlipidemia.  7. Depression.  8. Cholecystectomy.   SUBJECTIVE:  Kaitlin [name] is a 55 year old female who presents as a return  patient visit.  Her last visit, she was complaining of constipation,  abdominal pain and nausea.  Her nausea has resolved.  Her constipation  has resolved.  She is having two to three bowel movements a day.  However, her abdominal pain persists.  She has a hurting in her left  upper quadrant which moves through the middle of her abdomen and up into  chest.  She describes it as a nagging pain.  She does use Advil as  needed for pain.  She is also taking Xanax for the pain.  Stress may be  a precipitating factor.  She has never been tried on a proton pump  inhibitor.  Recently, after eating chicken franks, she woke up with  nausea and a burning in her throat, sweating and her heart racing.  She  treated with Mylanta and it got better.  She does not smoke, dip or  chew.  She chews one stick of gum daily.  She drinks one soda daily.  She likes to eat fried chicken once a week but does not notice any  worsening of her pain.  She consumes hamburger once every three to four  weeks.  Her gallbladder was taken out for a burning pain in the same  location and improved after her gallbladder was removed.  She is  concerned that she might have stomach cancer.   MEDICATIONS:  1. Lovastatin.  2. Xanax ER 2 mg three times a day.  3. Levothyroxine 50 mcg daily.  4. Lotrel 5/10 daily.  5. Advil as needed.  6. MiraLax one time a week.  7. Benefiber one time a week.   OBJECTIVE:  VITAL SIGNS:  Weight 182 pounds, height 5 feet 5 inches, BMI  30.6 (obese),  temperature 98.2, blood pressure 130/80, pulse 78.  GENERAL APPEARANCE:  She is in no apparent distress, alert and oriented  x4. HEENT:  Atraumatic and normocephalic.  Pupils are equal and reactive  to light.  Mouth:  No oral lesions.  Posterior pharynx without erythema  or exudate.  NECK:  Her neck has full range of motion, no  lymphadenopathy.  LUNGS:  Her lungs are clear to auscultation  bilaterally.  CARDIOVASCULAR:  Regular rhythm, no murmur, normal S1 and  S2. ABDOMEN:  Bowel sounds are present, soft, nontender, nondistended,  no rebound or guarding.  EXTREMITIES:  Without clubbing, cyanosis, or edema.NEUROLOGIC:  She has  no focal neurologic deficits.   ASSESSMENT:  Kaitlin [name] is a 55 year old female who has persistent  abdominal pain located in the epigastrium.  It is sometimes precipitated  by stress.  The differential diagnosis includes gastroesophageal reflux  disease, gastritis secondary to Helicobacter pylori infection,  nonsteroidal anti-inflammatory drugs, or eosinophilic infiltration,  nonulcer dyspepsia and a low likelihood of Sphincter of Oddi  dysfunction.   Thank you for allowing me  to see Kaitlin [name] in consultation.  My  recommendations follow.   RECOMMENDATIONS:  1. She will be scheduled for an upper endoscopy on October 16, 2006, to      further evaluate her abdominal pain.  Biopsies will be obtained on      the gastric mucosa.  2. She has a follow-up appointment to see me in two months.  3. Based on the findings on her upper endoscopy, I will decide whether      or not she needs a proton pump inhibitor.  4. She should continue the Benefiber and the MiraLax and adequate      consumption of water to have a bowel movement two to three times a      day.  5. If her symptoms are related to anxiety, she should continue to      follow with her mental health specialist.       Kaitlin [name], M.D.  Electronically Signed     SM/MEDQ  D:  10/08/2006  T:   10/09/2006  Job:  [id number]   cc:   Kaitlin [name], M.D.

## 2010-07-18 NOTE — Op Note (Signed)
Kaitlin [name], Kaitlin [name]              ACCOUNT NO.:  [id number]   MEDICAL RECORD NO.:  [id number]          PATIENT TYPE:  AMB   LOCATION:  DAY                           FACILITY:  [facility]   PHYSICIAN:  Kassie Mends, M.D.      DATE OF BIRTH:  1955/06/15   DATE OF PROCEDURE:  10/16/2006  DATE OF DISCHARGE:                               OPERATIVE REPORT   PROCEDURE:  Esophagogastroduodenoscopy with cold forceps biopsy.   INDICATION FOR EXAM:  Kaitlin [name] is a 55 year old female who complains  of abdominal pain and nausea.  Her pain is mostly in the left upper  quadrant.  She does use Advil as needed for pain.  Stress precipitates  her abdominal pain and she has never been tried on a proton pump  inhibitor.  She also complains of burning in her throat, sweating and  her heart racing.   FINDINGS:  1. Nodular appearing antral mucosa with occasional erosions.  Biopsies      obtained to evaluate for H pylori gastritis.  Otherwise normal      cardia and fundus.  2. Normal esophagus without evidence of Barrett's mass or ulceration      or erosion.  3. Normal duodenal bulb, second portion of the duodenum.   RECOMMENDATIONS:  1. Will call Kaitlin [name] with her biopsy reports.  2. She should continue Benefiber and MiraLax for constipation.  Will      consider the addition of a proton pump inhibitor if gastritis is      confirmed.  3. She has a follow-up appointment to see me in 2 months.  4. She should avoid gastric irritants.  She is given a handout on      gastric irritants and gastritis.   MEDICATIONS:  1. Demerol 125 mg IV.  2. Versed 7 mg IV.   PROCEDURE TECHNIQUE:  Physical exam was performed.  Informed consent was  obtained from the patient after explaining benefits, risks and  alternatives to the procedure.  The patient was connected to monitor  placed in left lateral position.  Continuous oxygen was provided by  nasal cannula and IV medicine administered through an indwelling  cannula.   After administration of sedation, the patient's esophagus  intubated.  Scope was advanced under  direct visualization to the second portion of the duodenum.  The scope  was removed slowly by carefully examining the color, texture, anatomy  and integrity of mucosa on the way out.  The patient was recovered in  endoscopy and discharged home in satisfactory condition.      Kassie Mends, M.D.  Electronically Signed     SM/MEDQ  D:  10/16/2006  T:  10/17/2006  Job:  [id number]   cc:   Donna Bernard, M.D.  Fax: [phone]

## 2010-07-18 NOTE — Assessment & Plan Note (Signed)
NAMEMarland Kitchen  Kaitlin [name], Kaitlin [name]               CHART#:  [id number]   DATE:  08/26/2007                       DOB:  1956-01-30   REFERRING PHYSICIAN:  Donna Bernard, MD   PROBLEM LIST:  1. Hot and cold biopsies.  2. H. pylori gastritis treated with negative H. pylori stool antigen.  3. Adenomatous polyps in April 2008, next colonoscopy in 2011.  4. Anxiety.  5. Chronic abdominal pain likely secondary to irritable bowel syndrome      (intermittent constipation and diarrhea).  6. Hypertension.  7. Hypothyroidism.  8. Hyperlipidemia.  9. Cholecystectomy.   SUBJECTIVE:  Kaitlin [name] is a 55 year old female who presents as a return  patient visit.  She is complaining of having diarrhea that she thinks is  brought on by the heat.  She denies any consumption of cheese, milk, or  ice cream.  She is now baking.  She eats salad with vinegar on it.  Today, she has had no bowel movements.  Yesterday, she had two.  Sometimes, if she has urge to go to bathroom, then she does go  immediately, she may have it running down my leg.  On Sunday, she had  some crampy abdominal pain and took some Pepto-Bismol.  She is not using  MiraLax or other fiber readily because she has not had any constipation  any more.  Tomatoes cause her to have abdominal pain.   MEDICATIONS:  1. Lovastatin.  2. Xanax t.i.d.  3. Levothyroxine.  4. Lotrel.  5. Advil as needed.  6. Hydrochlorothiazide 12.5 mg daily.  7. Pepto-Bismol.  8. Mylanta.   OBJECTIVE:   PHYSICAL EXAMINATION:  VITAL SIGNS:  Weight 194 pounds (up 11 pounds  since December 2008), height 5 feet 4 inches, temperature 98.2, blood  pressure 122/78, and pulse 72.GENERAL:  She is in no apparent distress.  Alert and oriented x4.LUNGS:  Clear to auscultation bilaterally.  CARDIOVASCULAR:  Regular rhythm.  No murmur.ABDOMEN:  Bowel sounds are  present, soft, nontender, nondistended, mild tenderness to palpation in  the left upper quadrant and the left lower  quadrant.  No rebound or  guarding.NEURO:  She has no focal neurologic deficits.   ASSESSMENT:  Kaitlin [name] is a 55 year old female with a personal history  of polyp.  She also is complaining of diarrhea.  Her diarrhea is likely  secondary to irritable bowel.  Lactose intolerance and bile salt induced  diarrhea remain in the differential diagnosis.  Thank you for allowing  me to see Kaitlin [name] in consultation.  My recommendations are as  follows.   RECOMMENDATIONS:  1. She is to add Bentyl 10 mg 30 minutes before breakfast and supper.      I did discuss with her that the side effects include drowsiness,      dry mouth, dry eyes, and urinary retention. Consider Colestid or a      lactose free diet if symptoms not improved.  2. Colonoscopy in 2011 with a personal history of history of large      polyps.  3. The patient will follow up in 3 months.       Kassie Mends, M.D.  Electronically Signed     SM/MEDQ  D:  08/26/2007  T:  08/27/2007  Job:  [id number]   cc:   Donna Bernard, M.D.

## 2010-07-21 NOTE — Consult Note (Signed)
Kaitlin [name], Kaitlin [name]              ACCOUNT NO.:  [id number]   MEDICAL RECORD NO.:  [id number]          PATIENT TYPE:  INP   LOCATION:  A222                          FACILITY:  [facility]   PHYSICIAN:  [place] Bing, M.D. LHCDATE OF BIRTH:  1955/12/07   DATE OF CONSULTATION:  05/30/2005  DATE OF DISCHARGE:  05/31/2005                                   CONSULTATION   REFERRING PHYSICIAN:  Dr. Lubertha South.   PRIMARY CARDIOLOGIST:  Dr. Dietrich Pates.   HISTORY OF PRESENT ILLNESS:  A 55 year old woman with multiple  cardiovascular risk factors presents with chest discomfort and palpitations.  Kaitlin [name] was evaluated by our service in 2003 when she experienced similar  symptoms.  A pharmacologic stress Cardiolite study at that time was  negative.  She has done well since with little in the way of chest  discomfort until recently.  She noted some sensation of fullness in her  chest the day prior to admission that lasted several hours and resolved  during sleep.  When she returned to work the following day, she experienced  substernal chest pressure with radiation to the left arm and neck.  There is  no associated dyspnea, but there was nausea.  She noted tachy palpitations  and anxiety, which she experiences as flushing.  She was evaluated at work  and directed toward the emergency department.  Her initial EKG showed right  bundle branch block, but subsequent tracings have been unremarkable.  Point  of care markers are negative.  Her symptoms improved spontaneously.   Kaitlin [name] has hypertension that has been well-controlled.  She has not had  problems with diabetes.  She also has hyperlipidemia that is treated  medically.   CURRENT MEDICATIONS:  1.  Lotrel 5/10 mg q.d.  2.  Lovastatin 20 mg q.d.  3.  Levothyroxine 1 q.d.  4.  Xanax up to 1 mg t.i.d.   PAST MEDICAL HISTORY:  Otherwise notable only for hypothyroidism.   SOCIAL HISTORY:  Resides in [place] with her husband; works at a  [employer]; married with one daughter and one grandson.  No use of tobacco  products nor alcohol.   ALLERGIES:  No known allergies.   FAMILY HISTORY:  Mother required percutaneous intervention for coronary  disease at age 77.  Father is deceased but had no cardiovascular disease.  She has a 91 year old brother with coronary disease diagnosed during a  stress test when he collapsed.  Accordingly, she does not wish to undergo  exercise treadmill testing.   REVIEW OF SYSTEMS:  Notable for recent symptoms that she characterizes as  sinusitis for which a course of antibiotics was prescribed.  She has noted  occasional dysuria.  She has chronic problem with anxiety but uses Xanax on  an as-needed basis.  She has not taken any for the past week.   PHYSICAL EXAMINATION:  GENERAL:  On exam, pleasant overweight woman in no  acute distress.  VITAL SIGNS:  Weight is 179, temperature 98.3, heart rate 110 and regular,  respirations 20, blood pressure 155/80.  HEENT:  Anicteric sclerae; normal lids and  conjunctiva; pupils equal, round,  react to light.  NECK:  No jugular venous distension; normal carotid upstrokes without  bruits.  ENDOCRINE:  No thyromegaly.  SKIN:  No significant lesions.  HEMATOPOIETIC:  No adenopathy.  CARDIOVASCULAR:  Normal first and second heart sounds.  LUNGS:  Clear.  ABDOMEN:  Soft and nontender; aortic pulsation not palpable; no  organomegaly.  EXTREMITIES:  Normal distal pulses; no edema.  NEUROMUSCULAR:  Symmetric strength and tone; normal cranial nerves.  MUSCULOSKELETAL:  No joint deformities.  PSYCHIATRIC:  Alert and oriented; normal affect.   STUDIES:  EKG:  Sinus tachycardia; somewhat delayed R-wave progression;  borderline left atrial abnormality.   Other laboratory notable for normal CBC, normal chemistry profile except for  a borderline potassium of 3.5, normal LFTs and a normal chest x-ray.   IMPRESSION:  Kaitlin [name] presents with significant  cardiovascular risk  factors and worrisome symptoms but a benign EKG, exam and initial cardiac  markers.  Additional CPKs and troponins will be collected as well as a  repeat EKG in the morning.  The presence of a right bundle branch block when  she first presented is unusual in that her heart rate was slower than on  subsequent tracing.  I wonder if this was actually her EKG.  If so, she may  have a bradycardia-related bundle branch block that only slightly increases  her risk of coronary disease.  We will proceed with a pharmacologic stress  nuclear study in the morning, assuming negative testing to that point.  A  lipid profile will be obtained.  Due to the presence of a sinus tachycardia,  amlodipine will be discontinued and beta-blocker started.  It is possible  that much of her symptomatology is related to withdrawal from  benzodiazepines.  It is difficult to determine from her history whether she  has taken enough to become habituated.      Braham Bing, M.D. [facility]  Electronically Signed     RR/MEDQ  D:  05/30/2005  T:  06/01/2005  Job:  [id number]

## 2010-07-21 NOTE — Procedures (Signed)
   NAME:  Kaitlin [name], Kaitlin [name]                        ACCOUNT NO.:  [id number]   MEDICAL RECORD NO.:  [id number]                   PATIENT TYPE:  OUT   LOCATION:  RAD                                  FACILITY:  [facility]   PHYSICIAN:  Thomas C. Wall, M.D. [facility]            DATE OF BIRTH:  August 14, 1955   DATE OF PROCEDURE:  DATE OF DISCHARGE:  10/17/2001                                    STRESS TEST   PROCEDURE:  Adenosine Cardiolite.   PROCEDURE:  Exercise Cardiolite.   SUMMARY OF HISTORY:  This patient is a 55 year old female who was seen in  our office on 09/29/01 by Dr. Dietrich Pates.  She had had a severe evidence of  chest discomfort with radiation to her left arm without any other associated  symptoms. She came to the emergency room and was apparently told by someone  that she had an adverse reaction to Levaquin which had been recently started  for a urinary tract infection.  Evidently Dr. __________ re-examined her and  discontinued the medication.   Her history is notable for hypertension and mild hyperlipidemia.  After  being examined in the office on 07/28 it was felt that she should undergo a  stress Cardiolite.   DESCRIPTION OF PROCEDURE:  Baseline EKG shows normal sinus rhythm, delayed R  wave, nonspecific ST-T wave changes.  Blood pressure is 142/86.  Utilizing  43.2 mg of IV adenosine this was infused over 4 minutes.  During the  infusion she did have some flushing and chest and leg tightness which  resolved post infusion.  IV Cardiolite was administered at 3 minutes.   During infusion and recovery she did not have any EKG changes.  Images and  results are pending Dr. Vern Claude review.     Graciella Freer Daleen Squibb, M.D. [facility]    EW/MEDQ  D:  10/17/2001  T:  10/20/2001  Job:  [id number]

## 2010-07-21 NOTE — Consult Note (Signed)
Kaitlin [name], Kaitlin [name]              ACCOUNT NO.:  [id number]   MEDICAL RECORD NO.:  [id number]          PATIENT TYPE:  AMB   LOCATION:                                FACILITY:  [facility]   PHYSICIAN:  Kassie Mends, M.D.      DATE OF BIRTH:  06-Nov-1955   DATE OF CONSULTATION:  05/08/2006  DATE OF DISCHARGE:                                 CONSULTATION   REASON FOR CONSULTATION:  Hematochezia.   HISTORY OF PRESENT ILLNESS:  Kaitlin [name] is a 55 year old female who  complains of constipation since her cholecystectomy in 1997.  Her  constipation has been successfully treated with stool softener over the  last 3-1/2 weeks.  She still only has a bowel movement once a week  unless she has PMS period.  During her premenstrual syndrome, she has  a bowel movement daily.  She states she was having blood in her stool  which was associated with straining.  She had right much bleeding like  a period.  This happened once.  She was scared enough to have to take a  Xanax.  This occurred at the end of January/the beginning of  February2008.  She denies any weight loss.  She has not had any  difficulty swallowing.  She does complain of pain in her epigastric  region.  It does not radiate.  She has had this ongoing pain since last  year.  She describes it as a tightness or soreness that happens every  night.  She thought it was possibly an overworked muscle.  She does use  four Advil a day.  She denies any nausea, vomiting or problems  swallowing.  She does not have any heartburn or indigestion.  She denies  any black tarry stools.  She did have her blood count checked and it was  13 at work.  She has never had an upper endoscopy.  The pain in her  abdomen was worse if her bra is too tight.  It is not worse with food.  It is sore right now.   PAST MEDICAL HISTORY:  1. Hypertension.  2. Hypothyroidism.  3. Anxiety.  4. Panic attacks.  5. Hyperlipidemia.  6. Depression.   PAST SURGICAL HISTORY:   Cholecystectomy by Dr. Okey Dupre in [place],  [place].   ALLERGIES:  NO KNOWN DRUG ALLERGIES.   MEDICATIONS:  1. Lovastatin 20 mg daily.  2. Xanax ER 2 mg b.i.d.  3. Levothyroxine 50 mcg daily.  4. Lotrel 5/10 daily.  5. Tamiflu since Tuesday.  6. Advil as needed.   FAMILY HISTORY:  She has no family history of colon cancer; colon  polyps; ovarian, breast, or uterine cancer.  She has a family history of  myocardial infarction and coronary artery disease.   SOCIAL HISTORY:  She is married and has one daughter age 76.  She works  at [employer].  She denies any tobacco use.  She  drinks a beer every 3 months.   REVIEW OF SYSTEMS:  Per the HPI otherwise all systems are negative.   PHYSICAL EXAMINATION:  VITAL  SIGNS:  Weight 184 pounds, height 5 feet 5  inches, BMI 30.6 (obese), temperature 98.7, blood pressure 126/82, pulse  84.GENERAL:  She is in no apparent distress, alert and orient x4.HEENT:  Exam is atraumatic, normocephalic.  Pupils equal and react to light.  Mouth no oral lesions.  Posterior pharynx without erythema or  exudate.NECK:  Full range of motion.  No lymphadenopathy.LUNGS:  Clear  to auscultation bilaterally.CARDIOVASCULAR EXAM:  Regular rhythm, no  murmur, normal S1 and S2.ABDOMEN:  Bowel sounds are present, soft,  nondistended.  She has tenderness to palpation in her epigastrium over  her xiphoid process.  She has no hepatosplenomegaly, abdominal bruits,  or pulsatile masses.  Her abdomen is otherwise nontender. EXTREMITIES:  Her extremities are without cyanosis, clubbing, or edema.NEURO:  She has  no focal neurologic deficits.   ASSESSMENT:  Kaitlin [name] is a 55 year old female with epigastric  abdominal pain which is musculoskeletal in etiology.  Her constipation  is likely secondary to a bile acid deficiency.  Her hematochezia is  likely secondary to hemorrhoids but the differential diagnosis includes  rectal polyp and a low  likelihood of colorectal cancer.  Thank you for  allowing me to see Ms. Fear in consultation.  My recommendations  follow.   RECOMMENDATIONS:  1. She should continue her stool softener.  I will make further      recommendations regarding her constipation after colonoscopy is      complete.  2. She will be scheduled for a colonoscopy on May 17, 2006 with an      OsmoPrep.  I did discuss the risk of kidney damage if she does not      maintain adequate hydration.  I did spend 10 minutes with Ms.      Kurihara discussing her significant anxiety related to the procedure.      She was reassured that should she have anxiety prior to the      procedure that an anxiolytic may be available as well as Phenergan      prior to the procedure after consent is obtained.  3. Followup will be scheduled after her colonoscopy is complete.  4. I suggested to Ms. Chandran to change the type of bras that she is      wearing in order to provide some relief from her anatomic problem      of her bra pressing on her xiphoid process and causing her      persistent pain in her epigastric region.      Kassie Mends, M.D.  Electronically Signed     SM/MEDQ  D:  05/08/2006  T:  05/08/2006  Job:  [id number]   cc:   Donna Bernard, M.D.  Fax: [phone]

## 2010-07-21 NOTE — Procedures (Signed)
NAMEKEYANI, RIGDON              ACCOUNT NO.:  [id number]   MEDICAL RECORD NO.:  [id number]          PATIENT TYPE:  OBV   LOCATION:  A222                          FACILITY:  [facility]   PHYSICIAN:  Edward L. Juanetta Gosling, M.D.DATE OF BIRTH:  02/03/1956   DATE OF PROCEDURE:  DATE OF DISCHARGE:  05/31/2005                                EKG INTERPRETATION   DATE OF STUDY:  2440, May 30, 2005   The rhythm is sinus rhythm with a rate in the 70s.  There is what appears to  be right bundle branch block pattern.  Abnormal electrocardiogram.      Oneal Deputy. Juanetta Gosling, M.D.  Electronically Signed     ELH/MEDQ  D:  06/04/2005  T:  06/04/2005  Job:  [id number]

## 2010-07-21 NOTE — Op Note (Signed)
NAMECLAUDINE, STALLINGS              ACCOUNT NO.:  [id number]   MEDICAL RECORD NO.:  [id number]          PATIENT TYPE:  AMB   LOCATION:  DAY                           FACILITY:  [facility]   PHYSICIAN:  Kassie Mends, M.D.      DATE OF BIRTH:  03-15-1955   DATE OF PROCEDURE:  06/14/2006  DATE OF DISCHARGE:  06/14/2006                               OPERATIVE REPORT   REFERRING PHYSICIAN:  Lubertha South, M.D.   PROCEDURE:  Colonoscopy with snare cautery polypectomy.   INDICATION FOR EXAM:  Ms. Fredlund is a 55 year old female who presented  with constipation and rectal bleeding.  She had no family history of  colon cancer or colon polyps.   FINDINGS:  1. A descending colon polyp removed via snare cautery.  Sigmoid colon      polyp removed via snare cautery.  Otherwise no masses, inflammatory      changes or diverticula or hemorrhoids seen.   RECOMMENDATIONS:  1. Low residue diet and then begin high-fiber diet on April 18,2008.      She is given a handout on low fiber, low residue diet and high      fiber diet as well as polyps, constipation and high fiber diet.  2. No aspirin, NSAIDs or anticoagulation for 7 days.  3. Screening colonoscopy in 3 years.  4. She has a follow-up appointment to see me in 6 weeks for      constipation.   MEDICATIONS:  1. Demerol 75 mg IV.  2. Versed 4 mg IV.   PROCEDURE TECHNIQUE:  Physical exam was performed.  Informed consent was  obtained from the patient after explaining benefits, risks and  alternatives to the procedure.  The patient was connected to monitor and  placed in left lateral position.  Continuous oxygen was provided by  nasal cannula IV medicine administered through an indwelling cannula.  After administration of sedation and rectal exam, the scope was advanced  under direct visualization to the  cecum.  The scope was subsequently removed slowly by carefully examining  the color, texture, anatomy and integrity of mucosa on the way out.  The  patient was recovered in endoscopy and discharged home in satisfactory  condition.      Kassie Mends, M.D.  Electronically Signed     SM/MEDQ  D:  06/17/2006  T:  06/18/2006  Job:  [id number]   cc:   Donna Bernard, M.D.  Fax: [phone]

## 2010-07-21 NOTE — Procedures (Signed)
NAMEYENESIS, EVEN NO.:  [id number]   MEDICAL RECORD NO.:  [id number]          PATIENT TYPE:  OBV   LOCATION:  A222                          FACILITY:  [facility]   PHYSICIAN:  Vida Roller, M.D.   DATE OF BIRTH:  April 14, 1955   DATE OF PROCEDURE:  05/31/2005  DATE OF DISCHARGE:  05/31/2005                                    STRESS TEST   HISTORY:  [name] is a 55 year old female with no known coronary artery  disease and atypical chest discomfort __________.   BASELINE DATA:  Electrocardiogram reveals sinus rhythm at 72 beats per  minute __________.  Blood pressure is 128/78.   Forty-five milligrams of adenosine was infused over a 4-minute protocol.  Myoview was injected at 3 minutes.  The patient reported flushing which  resolved in recovery.  EKG revealed significant arrhythmias and ischemic  changes were noted.   Final images and results are pending MD review.      Jae Dire, P.A. [facility]      Vida Roller, M.D.  Electronically Signed    AB/MEDQ  D:  05/31/2005  T:  06/01/2005  Job:  [id number]

## 2010-07-21 NOTE — Procedures (Signed)
   NAME:  Kaitlin [name], Kaitlin [name]                        ACCOUNT NO.:  [id number]   MEDICAL RECORD NO.:  [id number]                   PATIENT TYPE:  EMS   LOCATION:  ED                                   FACILITY:  [facility]   PHYSICIAN:  Edward L. Juanetta Gosling, M.D.             DATE OF BIRTH:  January 07, 1956   DATE OF PROCEDURE:  04/05/2002  DATE OF DISCHARGE:  04/05/2002                                EKG INTERPRETATION   2010 April 05, 2002:  The rhythm is a sinus rhythm with a rate in the 80s.  Normal EKG.                                               Edward L. Juanetta Gosling, M.D.    Gwenlyn Found  D:  04/06/2002  T:  04/07/2002  Job:  [id number]

## 2010-07-21 NOTE — Procedures (Signed)
Kaitlin [name], Kaitlin [name]              ACCOUNT NO.:  [id number]   MEDICAL RECORD NO.:  [id number]          PATIENT TYPE:  OBV   LOCATION:  A222                          FACILITY:  [facility]   PHYSICIAN:  Edward L. Juanetta Gosling, M.D.DATE OF BIRTH:  1955-08-22   DATE OF PROCEDURE:  DATE OF DISCHARGE:  05/31/2005                                EKG INTERPRETATION   DATE OF STUDY:  10:45, May 30, 2005   The rhythm is sinus rhythm with a rate in the 80s.  There is a question of  left atrial enlargement.  Otherwise normal electrocardiogram.      Edward L. Juanetta Gosling, M.D.  Electronically Signed     ELH/MEDQ  D:  06/04/2005  T:  06/04/2005  Job:  [id number]

## 2010-07-21 NOTE — H&P (Signed)
NAMEMAIDA, WIDGER              ACCOUNT NO.:  [id number]   MEDICAL RECORD NO.:  [id number]          PATIENT TYPE:  INP   LOCATION:  A222                          FACILITY:  [facility]   PHYSICIAN:  Donna Bernard, M.D.DATE OF BIRTH:  [birth year]   DATE OF ADMISSION:  05/30/2005  DATE OF DISCHARGE:  03/29/2007LH                                HISTORY & PHYSICAL   CHIEF COMPLAINT:  Chest pain.   SUBJECTIVE:  This patient is a 55 year old female with a history of  hyperlipidemia, hypotensive, hypothyroidism, chronic anxiety and reflux who  presents to the emergency room the day of admission with acute concerns. The  patient noted chest pain.  She describes it as a deep pressure at times and  at other times sharp. She did have some slight shortness of breath at times.  No diaphoresis and slight nausea. She also notes her heart skipping a bit.  She went on to the emergency room. The patient claims compliance with  current medications which include Xanax 0.5 mg p.r.n., Lotrel 5/10 one  daily, lovastatin 20 mg daily, Levothroid 0.5 mg daily.   PAST MEDICAL HISTORY:  Significant for as noted above.   FAMILY HISTORY:  Positive for coronary artery disease in a brother, uncles  and mother and positive for hypertension and renal failure.   SOCIAL HISTORY:  The patient is married, one daughter. No tobacco or alcohol  use.   REVIEW OF SYSTEMS:  Otherwise negative. No allergies. Nonsmoker.   PHYSICAL EXAMINATION:  GENERAL:  Blood pressure 140/72, respirations 20,  heart rate 90, afebrile.  HEENT:  Normal.  NECK:  Supple.  LUNGS:  Clear.  HEART:  Regular rate and rhythm. No significant murmurs.  ABDOMEN:  Soft. No tenderness. Some tenderness in the anterior chest to deep  palpation.  EXTREMITIES:  Normal.   STUDIES:  EKG:  Normal sinus rhythm. No significant ST-T changes. Cardiac  enzymes negative.   IMPRESSION:  1.  Chest pain with multiple risk factors.  2.  Hyperlipidemia.  3.   Hypertension.  4.  Chronic anxiety.   PLAN:  Admit for monitoring, CCNT. Cardiology consultation. Further orders  as noted in the chart.      Donna Bernard, M.D.  Electronically Signed     WSL/MEDQ  D:  06/01/2005  T:  06/01/2005  Job:  [id number]

## 2010-08-07 ENCOUNTER — Ambulatory Visit (HOSPITAL_COMMUNITY)
Admission: RE | Admit: 2010-08-07 | Discharge: 2010-08-07 | Disposition: A | Discharge status: Home / Self Care | Payer: BC Managed Care – PPO | Source: Ambulatory Visit | Attending: Gastroenterology | Admitting: Gastroenterology

## 2010-08-07 ENCOUNTER — Other Ambulatory Visit: Payer: Self-pay | Admitting: Gastroenterology

## 2010-08-07 ENCOUNTER — Encounter: Payer: BC Managed Care – PPO | Admitting: Gastroenterology

## 2010-08-07 DIAGNOSIS — D128 Benign neoplasm of rectum: Secondary | ICD-10-CM | POA: Insufficient documentation

## 2010-08-07 DIAGNOSIS — K621 Rectal polyp: Secondary | ICD-10-CM

## 2010-08-07 DIAGNOSIS — I10 Essential (primary) hypertension: Secondary | ICD-10-CM | POA: Insufficient documentation

## 2010-08-07 DIAGNOSIS — Z1211 Encounter for screening for malignant neoplasm of colon: Secondary | ICD-10-CM

## 2010-08-07 DIAGNOSIS — Z860100 Personal history of colon polyps, unspecified: Secondary | ICD-10-CM | POA: Insufficient documentation

## 2010-08-07 DIAGNOSIS — K620 Anal polyp: Secondary | ICD-10-CM

## 2010-08-07 DIAGNOSIS — E785 Hyperlipidemia, unspecified: Secondary | ICD-10-CM | POA: Insufficient documentation

## 2010-08-07 DIAGNOSIS — D126 Benign neoplasm of colon, unspecified: Secondary | ICD-10-CM | POA: Insufficient documentation

## 2010-08-07 DIAGNOSIS — Z09 Encounter for follow-up examination after completed treatment for conditions other than malignant neoplasm: Secondary | ICD-10-CM | POA: Insufficient documentation

## 2010-08-07 DIAGNOSIS — Z79899 Other long term (current) drug therapy: Secondary | ICD-10-CM | POA: Insufficient documentation

## 2010-08-07 HISTORY — PX: COLONOSCOPY: SHX174

## 2010-08-10 ENCOUNTER — Ambulatory Visit: Payer: BC Managed Care – PPO | Admitting: Urgent Care

## 2010-08-10 ENCOUNTER — Encounter: Payer: Self-pay | Admitting: Gastroenterology

## 2010-08-10 ENCOUNTER — Inpatient Hospital Stay (HOSPITAL_COMMUNITY)
Admission: RE | Admit: 2010-08-10 | Discharge: 2010-08-11 | DRG: 464 | Disposition: A | Discharge status: Home / Self Care | Payer: BC Managed Care – PPO | Source: Ambulatory Visit | Attending: Internal Medicine | Admitting: Internal Medicine

## 2010-08-10 ENCOUNTER — Inpatient Hospital Stay (HOSPITAL_COMMUNITY): Payer: BC Managed Care – PPO

## 2010-08-10 ENCOUNTER — Ambulatory Visit (INDEPENDENT_AMBULATORY_CARE_PROVIDER_SITE_OTHER): Payer: BC Managed Care – PPO | Admitting: Gastroenterology

## 2010-08-10 ENCOUNTER — Encounter: Payer: Self-pay | Admitting: Urgent Care

## 2010-08-10 ENCOUNTER — Ambulatory Visit (INDEPENDENT_AMBULATORY_CARE_PROVIDER_SITE_OTHER): Payer: BC Managed Care – PPO | Admitting: Urgent Care

## 2010-08-10 VITALS — BP 180/100 | HR 106 | Temp 97.5°F | Ht 65.0 in | Wt 189.2 lb

## 2010-08-10 DIAGNOSIS — R109 Unspecified abdominal pain: Secondary | ICD-10-CM

## 2010-08-10 DIAGNOSIS — F411 Generalized anxiety disorder: Secondary | ICD-10-CM | POA: Diagnosis present

## 2010-08-10 DIAGNOSIS — G8918 Other acute postprocedural pain: Principal | ICD-10-CM | POA: Diagnosis present

## 2010-08-10 DIAGNOSIS — D259 Leiomyoma of uterus, unspecified: Secondary | ICD-10-CM | POA: Diagnosis present

## 2010-08-10 DIAGNOSIS — Z860100 Personal history of colon polyps, unspecified: Secondary | ICD-10-CM

## 2010-08-10 DIAGNOSIS — I10 Essential (primary) hypertension: Secondary | ICD-10-CM

## 2010-08-10 DIAGNOSIS — Z9889 Other specified postprocedural states: Secondary | ICD-10-CM

## 2010-08-10 DIAGNOSIS — Y838 Other surgical procedures as the cause of abnormal reaction of the patient, or of later complication, without mention of misadventure at the time of the procedure: Secondary | ICD-10-CM | POA: Diagnosis present

## 2010-08-10 LAB — URINALYSIS, ROUTINE W REFLEX MICROSCOPIC
Glucose, UA: NEGATIVE mg/dL
Nitrite: NEGATIVE
Specific Gravity, Urine: <1.005 — ABNORMAL LOW (ref 1.005–1.030)
pH: 6.5 (ref 5.0–8.0)

## 2010-08-10 LAB — COMPREHENSIVE METABOLIC PANEL
Albumin: 4.7 g/dL (ref 3.5–5.2)
BUN: 8 mg/dL (ref 6–23)
Calcium: 11.5 mg/dL — ABNORMAL HIGH (ref 8.4–10.5)
Creatinine, Ser: 0.66 mg/dL (ref 0.4–1.2)
Potassium: 4.2 mEq/L (ref 3.5–5.1)
Total Protein: 8.9 g/dL — ABNORMAL HIGH (ref 6.0–8.3)

## 2010-08-10 LAB — DIFFERENTIAL
Eosinophils Absolute: 0.1 10*3/uL (ref 0.0–0.7)
Eosinophils Relative: 1 % (ref 0–5)
Lymphs Abs: 1.8 10*3/uL (ref 0.7–4.0)
Monocytes Absolute: 0.3 10*3/uL (ref 0.1–1.0)

## 2010-08-10 LAB — TSH: TSH: 3.354 u[IU]/mL (ref 0.350–4.500)

## 2010-08-10 LAB — CBC
MCH: 28.5 pg (ref 26.0–34.0)
MCHC: 33.5 g/dL (ref 30.0–36.0)
MCV: 85 fL (ref 78.0–100.0)
Platelets: 336 10*3/uL (ref 150–400)
RDW: 12.5 % (ref 11.5–15.5)
WBC: 7.6 10*3/uL (ref 4.0–10.5)

## 2010-08-10 LAB — LIPASE, BLOOD: Lipase: 20 U/L (ref 11–59)

## 2010-08-10 IMAGING — CT CT ABD-PELV W/ CM
2 of 5 series · 16 of 46 positions shown, 18 images · IV contrast (Omnipaque 300)
Comparison: [DATE]

CLINICAL DATA: Abdominal pain status post colonoscopy with polyps
removal in [DATE]

CT ABDOMEN AND PELVIS WITH CONTRAST
TECHNIQUE: Multidetector CT imaging of the abdomen and pelvis was
performed following the standard protocol during bolus
administration of intravenous contrast.
Contrast: 100 ml of Omnipaque

[Series 2: abd_pel_with 5.0 b40f · axial · 0.76mm/px · z∈[+478,+893]mm · 13 of 95 slices shown, 15 images]
[im 6/95  soft-tissue]
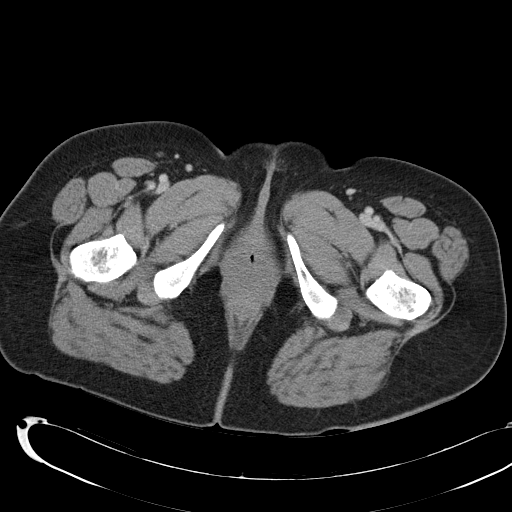
[im 6/95  bone]
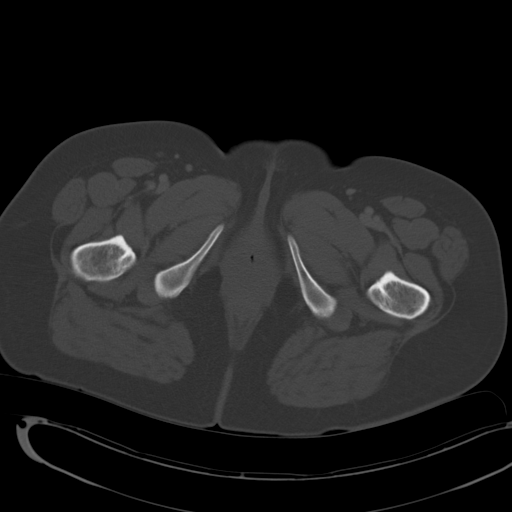
[im 11/95  soft-tissue]
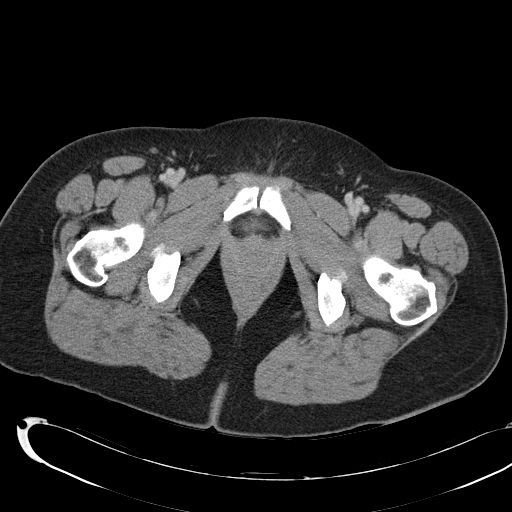
[im 21/95  soft-tissue]
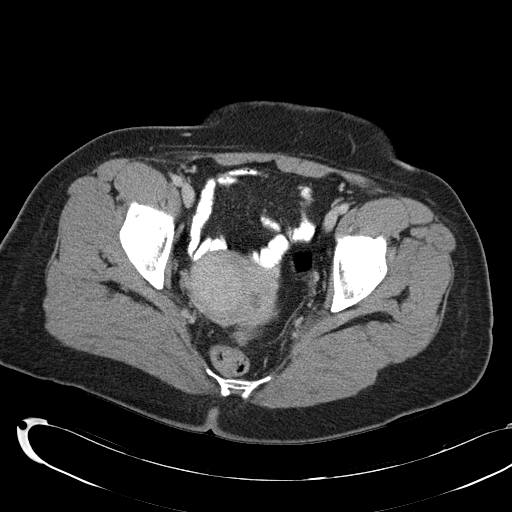
[im 27/95  soft-tissue]
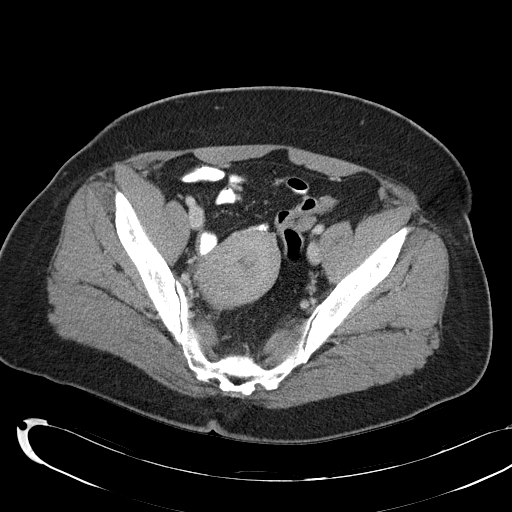
[im 32/95  soft-tissue]
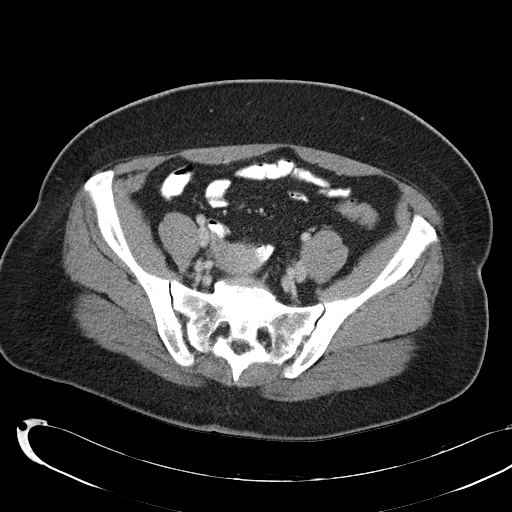
[im 42/95  soft-tissue]
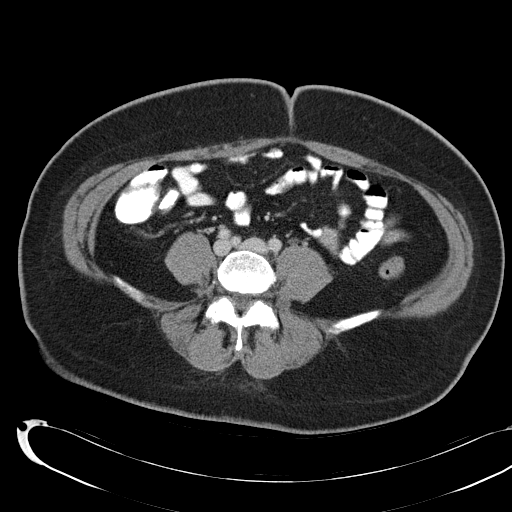
[im 48/95  soft-tissue]
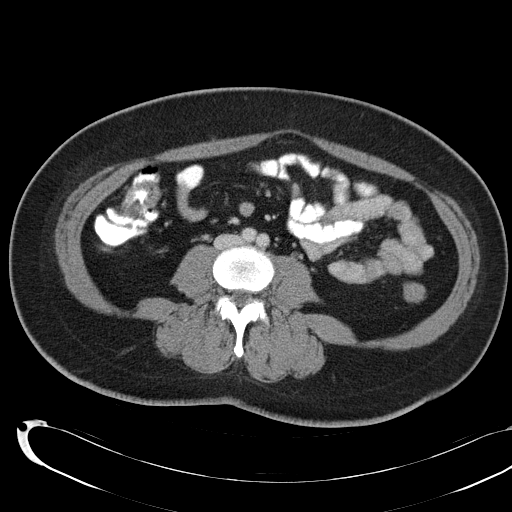
[im 53/95  soft-tissue]
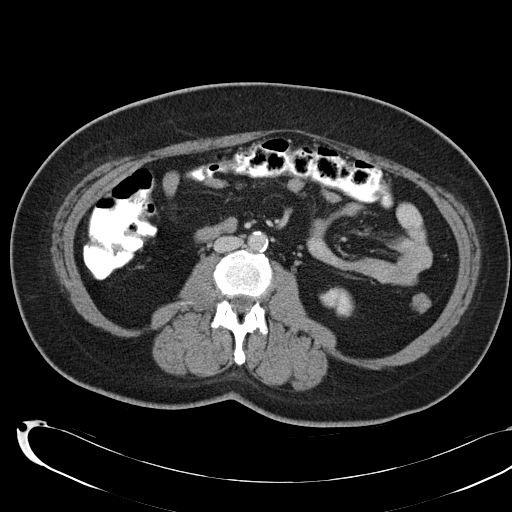
[im 63/95  soft-tissue]
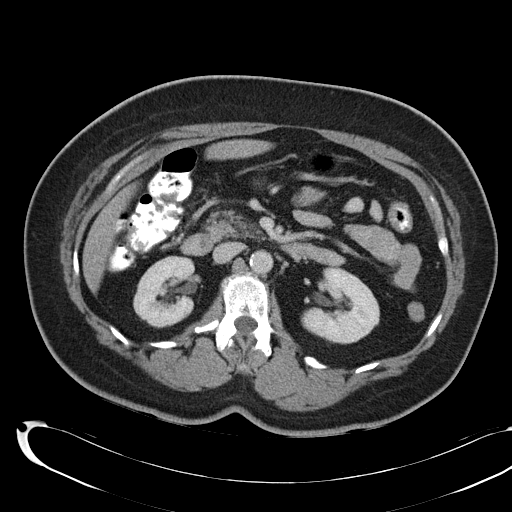
[im 63/95  bone]
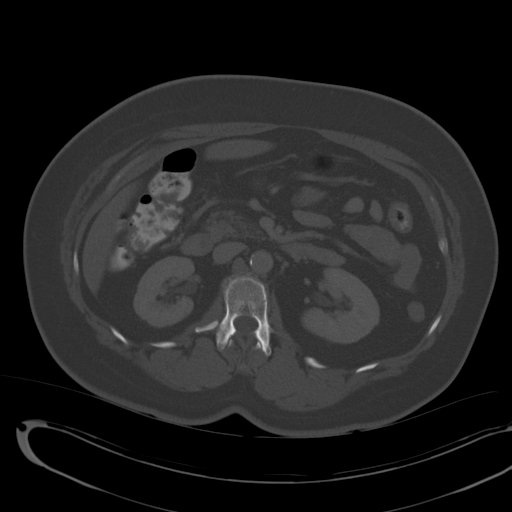
[im 68/95  soft-tissue]
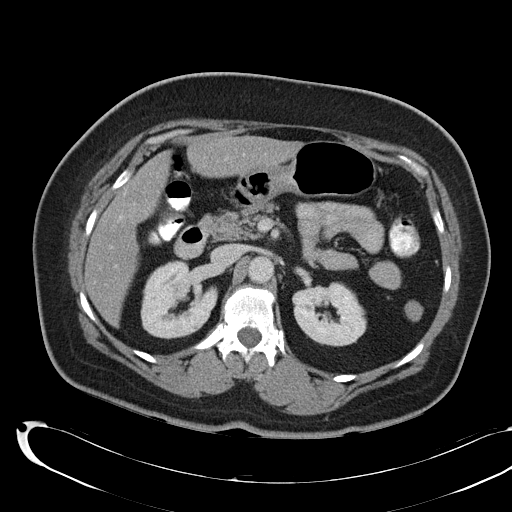
[im 74/95  soft-tissue]
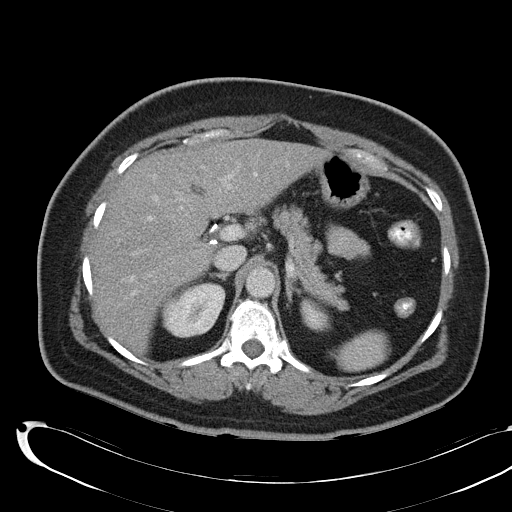
[im 84/95  soft-tissue]
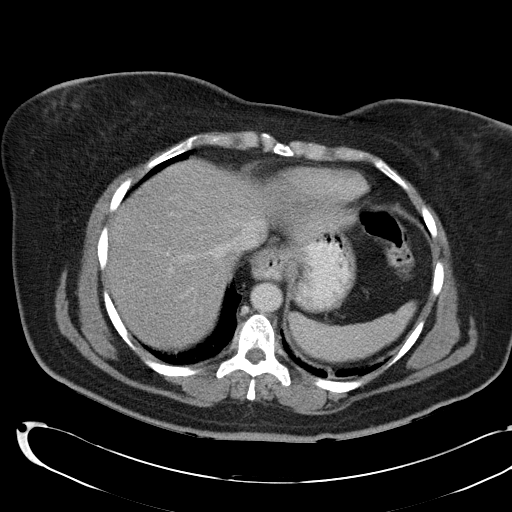
[im 89/95  soft-tissue]
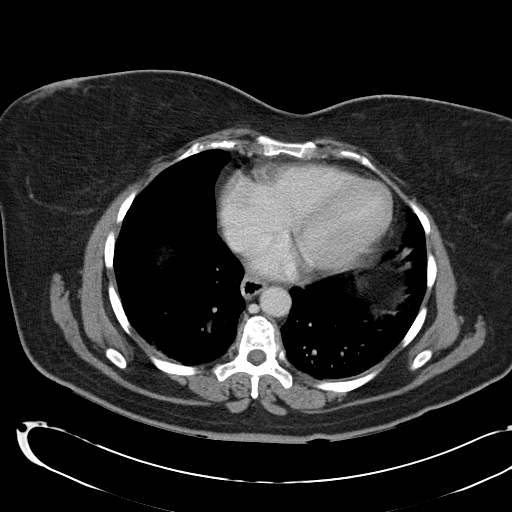

[Series 4: abd_pel_with 3.0 spo cor · coronal · 0.75mm/px · 3 of 85 slices shown]
[im 29/85  soft-tissue]
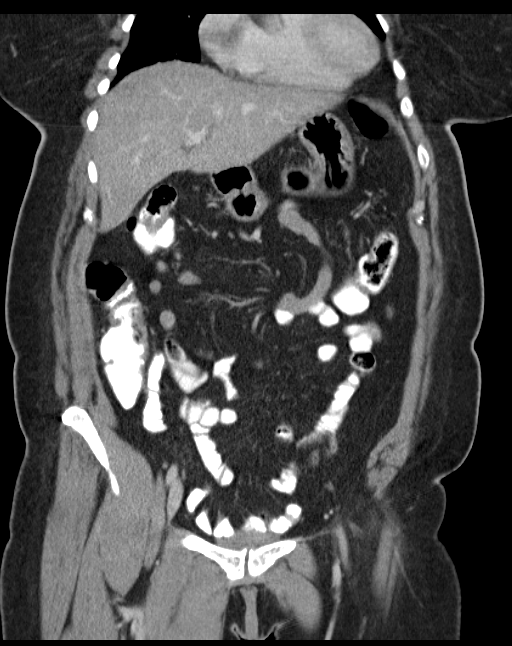
[im 38/85  soft-tissue]
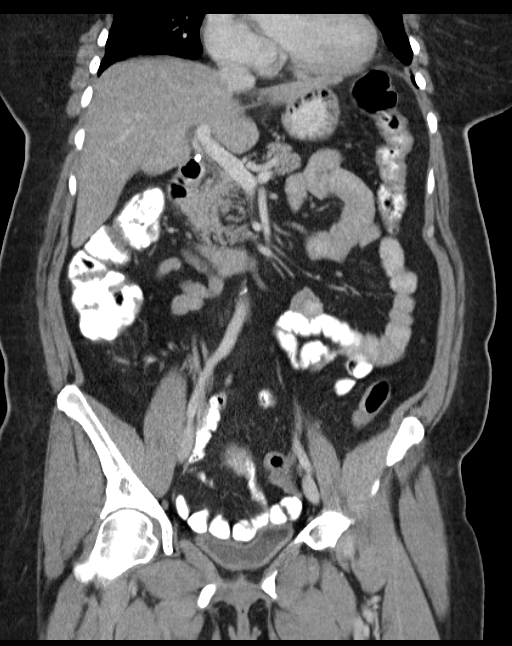
[im 47/85  soft-tissue]
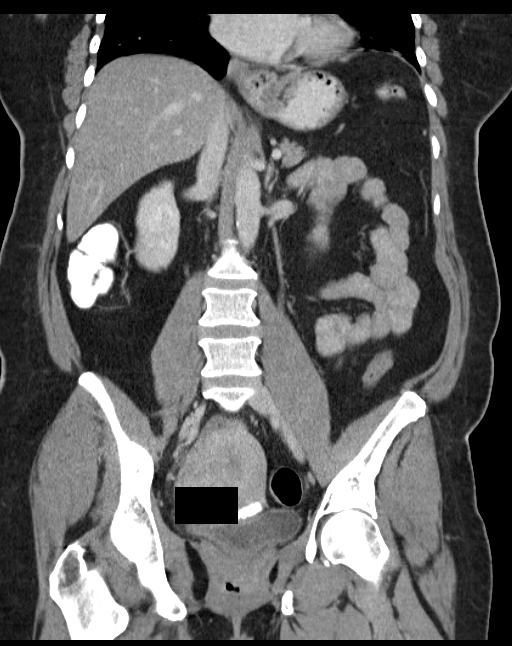

[16 of 46 positions shown; findings below may reference images not displayed]

FINDINGS: Lung bases are unremarkable.  Small hiatal hernia again
noted.

Enhanced liver is unremarkable.  No intrahepatic biliary ductal
dilatation.

The patient is status post cholecystectomy.

Enhanced spleen, pancreas and adrenal glands are unremarkable.  The
enhanced kidneys are symmetrical in size.  No hydronephrosis or
hydroureter.  No focal renal mass.

Mild atherosclerotic calcifications of the abdominal aorta and the
iliac arteries are noted.  There is no evidence of colonic
obstruction.  No thickening of the colonic wall.  No extraluminal
contrast material is noted.  No ascites or free air.  No small
bowel obstruction.  No adenopathy.

There is no pericecal inflammation.

Delayed renal images shows bilateral renal symmetrical excretion.

Small amount of nonspecific fluid is noted within uterus.  There is
slight enhancing nodule within the right myometrial wall measures
4.7 x 4.2 cm.  This most likely represents a fibroid.  Correlation
with GYN exam and further evaluation with pelvic ultrasound is
recommended.  The rectosigmoid colon is unremarkable.  The sigmoid
colon is empty collapsed.  The urinary bladder is unremarkable.  No
destructive bony lesions are noted within the pelvis.

Sagittal images of the spine shows mild degenerative changes lower
thoracic spine.
IMPRESSION: 1.  No evidence of colonic wall thickening or colonic obstruction.
No extraluminal contrast material.  No ascites or free air.
2.  No hydronephrosis or hydroureter.
3.  Status post cholecystectomy.

4.  There is slight enhancing nodule in the right myometrial
uterine wall.  Measures 4.7 x 4.2 cm.  This is highly suspicious
for uterine fibroid.  Further evaluation with GYN exam and pelvic
ultrasound is recommended.

## 2010-08-10 MED ORDER — IOHEXOL 300 MG/ML  SOLN
100.0000 mL | Freq: Once | INTRAMUSCULAR | Status: AC | PRN
  Administered 2010-08-10: 100 mL via INTRAVENOUS

## 2010-08-10 NOTE — Progress Notes (Signed)
Cc to PCP 

## 2010-08-10 NOTE — Progress Notes (Signed)
Referring Provider: Ardyth Gal, MD Primary Care Physician:  Harlow Asa, MD, MD Primary Gastroenterologist:  Dr. Darrick Penna  Chief Complaint  Patient presents with  . Abdominal Pain  . Nausea    HPI:  Kaitlin [name] is a 55 y.o. female here for urgent work-in regarding post-polypectomy abd pain.  She has been pacing around the halls.  BP 180/120, HR 120.  C/o diaphoresis 7/10 pain.  Mostly in epigastrum, but radiates through entire abd.  C/o nausea, no vomiting.  Did not take BP pill until 30 mins ago.  No rectal bleeding or melena.  Denies fever.  Pain started at 3am after working.  BM this AM soft, brown.  Another BM yesterday.   Quite anxious, did take her xanax.  Colonoscopy w/ Dr Darrick Penna 3 days ago w/ 7 polyps removed-bx showed tubular adenomas.  Largest 1cm-hepatic flexure polyp->TA.    Past Medical History  Diagnosis Date  . BMI 30.0-30.9,adult 2008 182 LBS    2009 194 LBS  . Helicobacter pylori gastritis AUG 2008 PREVPAC: nausea-->HELIDAC: GI UPSET    HB 14.1 NL HFP H. PYLORI STOOL Ag NEG  . Colon polyp APR 2008  . Anxiety   . HTN (hypertension)   . Hypothyroidism   . Hyperlipemia   . Irritable bowel syndrome   . Tubulovillous adenoma 08/07/10    Colonoscopy w/ Dr Darrick Penna 3 days ago w/ 7 polyps removed-bx showed tubular adenomas.  Largest 1cm-hepatic flexure polyp->TA.      Past Surgical History  Procedure Date  . Colonoscopy APR 2008    [facility]/[place] SIMPLE ADENOMA  . Upper gastrointestinal endoscopy AUG 2008 PAIN/NAUSEA    H. PYLORI  . Cholecystectomy     Current Outpatient Prescriptions  Medication Sig Dispense Refill  . ALPRAZolam (XANAX) 1 MG tablet Take 1 mg by mouth at bedtime as needed.        . enalapril (VASOTEC) 20 MG tablet Take 20 mg by mouth daily.        Marland Kitchen levothyroxine (SYNTHROID, LEVOTHROID) 50 MCG tablet Take 50 mcg by mouth daily.        Marland Kitchen lovastatin (MEVACOR) 20 MG tablet Take 40 mg by mouth at bedtime.       . verapamil (COVERA HS) 240 MG (CO) 24 hr  tablet Take 240 mg by mouth at bedtime.          Allergies as of 08/10/2010  . (No Known Allergies)    Family History  Problem Relation Age of Onset  . Colon cancer Neg Hx   . Colon polyps Neg Hx     History   Social History  . Marital Status: Married    Spouse Name: N/A    Number of Children: N/A  . Years of Education: N/A   Occupational History  . Not on file.   Social History Main Topics  . Smoking status: Never Smoker   . Smokeless tobacco: Not on file  . Alcohol Use: No  . Drug Use: No  . Sexually Active: Not on file   Other Topics Concern  . Not on file   Social History Narrative  . No narrative on file    Review of Systems: Gen: see HPI, otherwise negative  Physical Exam: BP 180/100  Pulse 106  Temp(Src) 97.5 F (36.4 C) (Temporal)  Ht 5\' 3"  (1.6 m)  Wt 189 lb 4.8 oz (85.866 kg)  BMI 33.53 kg/m2 General:   Alert,  Well-developed, well-nourished, pleasant and ANXIOUS.  Diaphoretic. Head:  Normocephalic  and atraumatic. Eyes:  Sclera clear, no icterus.   Conjunctiva pink. Ears:  Normal auditory acuity. Nose:  No deformity, discharge,  or lesions. Mouth:  No deformity or lesions, dentition normal. Neck:  Supple; no masses or thyromegaly. Lungs:  Clear throughout to auscultation.   No wheezes, crackles, or rhonchi. No acute distress. Heart:  Regular rate and rhythm; no murmurs, clicks, rubs,  or gallops. Abdomen:  Soft and nondistended. Epigastric tender to palpation & tenderness @ umbilicus.  No masses, hepatosplenomegaly or hernias noted. Normal bowel sounds, with guarding, and without rebound.    Msk:  Symmetrical without gross deformities. Normal posture. Pulses:  Normal pulses noted. Extremities:  Without clubbing or edema. Neurologic:  Alert and  oriented x4;  grossly normal neurologically. Skin:  Intact without significant lesions or rashes. Cervical Nodes:  No significant cervical adenopathy. Psych:  Alert and cooperative. Normal mood and  affect.

## 2010-08-10 NOTE — Progress Notes (Deleted)
Hx of diverticulitis, April 2012. Feels pressure in lower abdomen when sitting, usually when getting up has to stand a few mionuites before walking. Hurts in lower back and upper buttocks. Also feels pressure intermittently in lower abdomen, at times will feel weak. Hasn't advanced to a high fiber diet. Still doing low-residue diet. Not relieved by BM. Prior to episode of diverticulitis had 3-4 BMs per day. Now 2 per day. Denies constipation. NO blood in stool.  Has decreased appetite. Lost approximately 10-12 lbs since diverticulitis. No N/V. NO epigastric pain. No dysphagia/odynophagia.

## 2010-08-10 NOTE — Assessment & Plan Note (Signed)
Severe post-polypectomy abd pain.  No overt GI bleeding.  Differentials include post-polypectomy electrocoagulation syndrome, perforation or less likely early post-polypectomy bleeding.  Discussed w/ Dr Jena Gauss in Dr Darrick Penna absence. She is also hypertensive & tachycardic with significant anxiety.    EMS called to transport pt.  Pt to be admitted to Dr Luvenia Starch service NPO x BP pills w/ sips STAT CT abd/pelvis w/ IV/oral contrast NS 75 cc/hr Dilaudid 1 mg q2hrs prn pain Zofran 4mg  IV q6hrs prn N/V Vasotec 20mg  qhs Verapamil 240mg  po qam STAT CBC, CMP, UA, TSH Up w/ assist only

## 2010-08-11 DIAGNOSIS — I10 Essential (primary) hypertension: Secondary | ICD-10-CM

## 2010-08-11 DIAGNOSIS — T8189XA Other complications of procedures, not elsewhere classified, initial encounter: Secondary | ICD-10-CM

## 2010-08-11 NOTE — Progress Notes (Signed)
Duplicate. Seen by Lorenza Burton, NP.

## 2010-08-14 ENCOUNTER — Telehealth: Payer: Self-pay | Admitting: Urgent Care

## 2010-08-14 ENCOUNTER — Other Ambulatory Visit: Payer: Self-pay | Admitting: Urgent Care

## 2010-08-14 DIAGNOSIS — K589 Irritable bowel syndrome without diarrhea: Secondary | ICD-10-CM

## 2010-08-14 NOTE — Telephone Encounter (Signed)
Pt noted in [facility] that she would like to change to Dr Jena Gauss. Needs FU OV w/ [name] in 1-2 weeks re:abd pain

## 2010-08-14 NOTE — Progress Notes (Signed)
A user error has taken place: encounter opened in error, closed for administrative reasons.

## 2010-08-16 NOTE — Telephone Encounter (Signed)
Opened in error

## 2010-08-18 NOTE — Progress Notes (Signed)
Agree. PT NOW AN RMR PT.

## 2010-08-22 NOTE — Telephone Encounter (Signed)
Has pt been scheduled FU OV?

## 2010-08-24 NOTE — Telephone Encounter (Signed)
Tried calling pt, but no answer or voicemail °

## 2010-08-24 NOTE — Telephone Encounter (Signed)
Ok, let's just send a letter. Thanks

## 2010-08-24 NOTE — Telephone Encounter (Signed)
Pt is aware of OV for 6/25 at 3pm with [facility]

## 2010-08-28 ENCOUNTER — Ambulatory Visit (INDEPENDENT_AMBULATORY_CARE_PROVIDER_SITE_OTHER): Payer: BC Managed Care – PPO | Admitting: Gastroenterology

## 2010-08-28 ENCOUNTER — Encounter: Payer: Self-pay | Admitting: Gastroenterology

## 2010-08-28 VITALS — BP 145/80 | HR 76 | Temp 97.8°F | Ht 66.0 in | Wt 199.0 lb

## 2010-08-28 DIAGNOSIS — Z9889 Other specified postprocedural states: Secondary | ICD-10-CM

## 2010-08-28 DIAGNOSIS — Z86010 Personal history of colonic polyps: Secondary | ICD-10-CM

## 2010-08-28 NOTE — Progress Notes (Signed)
Referring Provider: Ardyth Gal, MD Primary Care Physician:  Harlow Asa, MD, MD Gastroenterologist: Dr. Jena Gauss  Chief Complaint  Patient presents with  . Follow-up    HPI:   [name] presents in f/u today after brief admission early June after colonoscopy. Likely post-polypectomy syndrome. She was admitted from our office, with CT findings essentially benign except for possible uterine fibroid. No perforation. She will be seeing Dr. Despina Hidden in the next few weeks. Only complaint is constipation. Has hx of IBS. Was prescribed Miralax by Dr. Gerda Diss, but she hasn't started this yet. Will go at times a week without a BM. No melena or brbpr. No N/V.  Afebrile, no abdominal pain. Doing well.   Past Medical History  Diagnosis Date  . BMI 30.0-30.9,adult 2008 182 LBS    2009 194 LBS  . Helicobacter pylori gastritis AUG 2008 PREVPAC: nausea-->HELIDAC: GI UPSET    HB 14.1 NL HFP H. PYLORI STOOL Ag NEG  . Colon polyp APR 2008  . Anxiety   . HTN (hypertension)   . Hypothyroidism   . Hyperlipemia   . Irritable bowel syndrome   . Tubulovillous adenoma 08/07/10    Colonoscopy w/ Dr Darrick Penna 3 days ago w/ 7 polyps removed-bx showed tubular adenomas.  Largest 1cm-hepatic flexure polyp->TA.      Past Surgical History  Procedure Date  . Colonoscopy APR 2008    AC/[place] SIMPLE ADENOMA  . Upper gastrointestinal endoscopy AUG 2008 PAIN/NAUSEA    H. PYLORI  . Cholecystectomy     Current Outpatient Prescriptions  Medication Sig Dispense Refill  . ALPRAZolam (XANAX) 1 MG tablet Take 1 mg by mouth at bedtime as needed.        . enalapril (VASOTEC) 20 MG tablet Take 20 mg by mouth daily.        Marland Kitchen levothyroxine (SYNTHROID, LEVOTHROID) 50 MCG tablet Take 50 mcg by mouth daily.        Marland Kitchen lovastatin (MEVACOR) 20 MG tablet Take 40 mg by mouth at bedtime.       . verapamil (COVERA HS) 240 MG (CO) 24 hr tablet Take 240 mg by mouth at bedtime.          Allergies as of 08/28/2010  . (No Known Allergies)     Family History  Problem Relation Age of Onset  . Colon cancer Neg Hx   . Colon polyps Neg Hx     History   Social History  . Marital Status: Married    Spouse Name: N/A    Number of Children: N/A  . Years of Education: N/A   Social History Main Topics  . Smoking status: Never Smoker   . Smokeless tobacco: None  . Alcohol Use: No  . Drug Use: No  . Sexually Active: None     Review of Systems: Gen: Denies fever, chills, anorexia. Denies fatigue, weakness, weight loss.  CV: Denies chest pain, palpitations, syncope, peripheral edema, and claudication. Resp: Denies dyspnea at rest, cough, wheezing, coughing up blood, and pleurisy. GI: Denies vomiting blood, jaundice, and fecal incontinence.   Denies dysphagia or odynophagia. Derm: Denies rash, itching, dry skin Psych: Denies depression, anxiety, memory loss, confusion. No homicidal or suicidal ideation.  Heme: Denies bruising, bleeding, and enlarged lymph nodes.  Physical Exam: BP 145/80  Pulse 76  Temp(Src) 97.8 F (36.6 C) (Temporal)  Ht 5\' 6"  (1.676 m)  Wt 199 lb (90.266 kg)  BMI 32.12 kg/m2 General:   Alert and oriented. No distress noted. Pleasant and cooperative.  Head:  Normocephalic and atraumatic. Eyes:  Conjuctiva clear without scleral icterus. Mouth:  Oral mucosa pink and moist. Good dentition. No lesions. Neck:  Supple, without mass or thyromegaly. Heart:  S1, S2 present without murmurs, rubs, or gallops. Regular rate and rhythm. Abdomen:  +BS, soft, non-tender and non-distended. No rebound or guarding. No HSM or masses noted. Msk:  Symmetrical without gross deformities. Normal posture. Extremities:  Without edema. Neurologic:  Alert and  oriented x4;  grossly normal neurologically. Skin:  Intact without significant lesions or rashes. Psych:  Alert and cooperative. Normal mood and affect.

## 2010-08-28 NOTE — Patient Instructions (Signed)
Start Miralax every other day. Goal of a BM three times per week.   Follow a high fiber diet.   Make sure to follow-up with your gynecologist regarding the findings on the CT scan.   Follow-up in 3 months.

## 2010-08-28 NOTE — Assessment & Plan Note (Signed)
55 year old female s/p colonoscopy early June 2012, with likely post-polypectomy syndrome requiring brief admission to [facility] several days later. Significant improvement during brief inpatient stay.  CT scan essentially benign but did show possible uterine fibroid. This will be followed by Dr. Despina Hidden. Pt has appt coming up per her report.   Complaints of constipation without any associated abdominal pain, N/V or rectal bleeding. Will proceed with Miralax every other day. Discussed with pt she may not have a BM every day; this may be normal for her. Will shoot for a BM 3X per week. High fiber diet as already instructed in past. Pt has handout.  Return in 3 months. As of note, requests to follow Dr. Jena Gauss.

## 2010-08-30 NOTE — Op Note (Signed)
  Kaitlin [name], Kaitlin [name]              ACCOUNT NO.:  [id number]  MEDICAL RECORD NO.:  [id number]  LOCATION:  DAYP                          FACILITY:  [facility]  PHYSICIAN:  Jonette Eva, M.D.     DATE OF BIRTH:  [birth year]  DATE OF PROCEDURE:  08/07/2010 DATE OF DISCHARGE:                              OPERATIVE REPORT   PROCEDURE:  Colonoscopy with cold forceps and snare cautery polypectomy.  REFERRING PROVIDER:  Donna Bernard, MD.  primary gastroenterologist: Eula Listen, M.D.  INDICATION FOR EXAM:  Kaitlin [name] is a 55 year old female who has a personal history of simple adenoma removed from the ascending and sigmoid colon.  She presents for subsequent surveillance.  FINDINGS: 1. A 1-cm hepatic flexure polyp removed via snare cautery. 2. A 6-mm sessile hepatic flexure polyp removed via snare cautery.     Two 2-mm sessile hepatic flexure polyps removed via cold forceps. 3. Three sessile hyperplastic-appearing rectal polyps removed via cold     forceps.  Otherwise, no masses, inflammatory changes, diverticula,     or AVMs seen. 4. Normal retroflex view of the rectum.  RECOMMENDATIONS: 1. Screening colonoscopy in 3 years. 2. No aspirin, NSAIDs, or anticoagulation for 3 days. 3. Will call with the results of her biopsies.  She should follow a high- fiber diet.  She was given a handout on high-fiber diet and polyps.  MEDICATIONS: 1. Demerol 100 mg IV. 2. Versed 5 mg IV. 3. Phenergan 12.5 mg IV.  PROCEDURE TECHNIQUE:  Physical exam was performed.  Informed consent was obtained from the patient after explaining the benefits, risks, and alternatives to the procedure.  The patient was connected to the monitor and placed in the left lateral position.  Continuous oxygen was provided by nasal cannula and IV medicine was administered through an indwelling cannula.  After administration of sedation and rectal exam, the patient's rectum was intubated and the scope was advanced under  direct visualization to the cecum.  Scope was removed slowly by carefully examining the color, texture, anatomy, and integrity of the mucosa on the way out.  The patient was recovered in an endoscopy and discharged home in satisfactory condition.  PATH: ADVANCED ADENOMA REMOVED FROM HEPATIC FLEXURE-TCS IN 3 YEARS. Post TCS course complicated by severe abd pain. Pt admitted for observation and elected to have Dr. Jena Gauss continue her subsequent care.   Jonette Eva, M.D.     SF/MEDQ  D:  08/07/2010  T:  08/07/2010  Job:  [id number]  cc:   Donna Bernard, M.D. Fax: [phone]  Electronically Signed by Jonette Eva M.D. on 08/30/2010 02:46:17 PM

## 2010-08-31 NOTE — Progress Notes (Signed)
Cc to PCP 

## 2010-09-01 NOTE — Progress Notes (Signed)
agree

## 2010-11-14 ENCOUNTER — Ambulatory Visit: Payer: BC Managed Care – PPO | Admitting: Urgent Care

## 2010-11-21 ENCOUNTER — Encounter: Payer: Self-pay | Admitting: Cardiology

## 2010-11-22 ENCOUNTER — Encounter: Payer: Self-pay | Admitting: Cardiology

## 2010-12-11 ENCOUNTER — Encounter: Payer: Self-pay | Admitting: Cardiology

## 2010-12-11 ENCOUNTER — Ambulatory Visit (INDEPENDENT_AMBULATORY_CARE_PROVIDER_SITE_OTHER): Payer: BC Managed Care – PPO | Admitting: Cardiology

## 2010-12-11 VITALS — BP 138/82 | HR 85 | Resp 16 | Ht 65.0 in | Wt 194.0 lb

## 2010-12-11 DIAGNOSIS — I10 Essential (primary) hypertension: Secondary | ICD-10-CM | POA: Insufficient documentation

## 2010-12-11 DIAGNOSIS — R079 Chest pain, unspecified: Secondary | ICD-10-CM | POA: Insufficient documentation

## 2010-12-11 DIAGNOSIS — R9431 Abnormal electrocardiogram [ECG] [EKG]: Secondary | ICD-10-CM

## 2010-12-11 NOTE — Discharge Summary (Signed)
[name], [name]              ACCOUNT NO.:  [id number]  MEDICAL RECORD NO.:  [id number]  LOCATION:  A325                          FACILITY:  [facility]  PHYSICIAN:  R. Roetta Sessions, MD FACP FACGDATE OF BIRTH:  21-Aug-1955  DATE OF ADMISSION:  08/10/2010 DATE OF DISCHARGE:  06/08/2012LH                              DISCHARGE SUMMARY   PHYSICIAN:  R. Roetta Sessions, MD Caleen Essex.  ADMISSION AND DISCHARGE DIAGNOSES: 1. Post polypectomy syndrome. 2. Hypertension. 3. Anxiety. 4. Uterine fibroids. 5. Hypercalcemia.  PROCEDURE:  She is status post colonoscopy by Dr. Raj Janus; August 07, 2010, with removal of seven polyps, largest being approximately 1 cm.  HISTORY AND PHYSICAL EXAM:  See yesterday's admission note.  Vital signs from today 97.6, pulse 64, respirations 20, blood pressure 109/66, and she sat 98% on 2 L via nasal cannula.  She is alert, awake, oriented, pleasant, cooperative, in no acute distress.  Sclerae nonicteric. Conjunctivae pink.  Oropharynx without lesions.  Chest:  Regular rate and rhythm.  Normal S1, S2 without murmurs, clicks, or gallops. ABDOMEN:  Positive bowel sounds x4.  No bruits auscultated.  Soft, nontender, nondistended without palpable mass or megaly.  EXTREMITIES: Without clubbing or edema.  Skin:  Pink, dry.  COURSE:  Kaitlin [name] is a 55 year old black female who was admitted to [facility] for post polypectomy abdominal pain.  She was felt to have electrocoagulation or coagulopathy pain.  She had a CT scan of abdomen and pelvis on August 10, 2010, which showed no evidence of perforation and benign abdomen.  She does have an incidental 4.7 x 4.2 cm right myometrial uterine wall nodule, suspected fibroid.  She says she has seen her gynecologist for this and will have followup appointment.  She was admitted for hypertension, as well with the blood pressure of 180/120.  She has been normotensive since admission.  She did have tachycardia and  this has resolved as well.  She did have significant anxiety while hospitalized, but has not had her scheduled dose of Xanax 1 mg t.i.d. that she takes at home.  She has done well. She received some Dilaudid for pain and Zofran for antiemetic and has not needing anything for pain today.  She is eating well and feels that she is able to go home.  Discharge condition is good.  She was also noted to have elevated calcium of 11.5.  She will need followup for this with her PCP.  DISPOSITION:  Discharged to home.  MEDICATIONS:  See reconciliation form.  INSTRUCTIONS:  Activity as tolerated.  FOLLOW-UP: 1. She is to call our office if she has any significant pain or     problems.  She is to follow up with her gynecologist regarding     uterine lesions, Dr. Despina Hidden.  She should have her calcium rechecked     as an outpatient. 2. Followup with Dr. Gerda Diss regarding calcium. 3. Followup with Dr. Darrick Penna as planned.     Lorenza Burton, N.P.   ______________________________ R. Roetta Sessions, MD FACP Clementeen Graham    KJ/MEDQ  D:  08/11/2010  T:  08/11/2010  Job:  [id number]  Electronically Signed by Lorenza Burton N.P. on  10/17/2010 04:09:09 PM Electronically Signed by Lorrin Goodell M.D. on 12/11/2010 09:34:48 AM

## 2010-12-11 NOTE — Assessment & Plan Note (Signed)
Tracing reviewed showing increased voltage, possibly related to hypertension over the years. Does report some family members with "heart failure" although no definite familial history. Tracing not typical for hypertrophic cardiomyopathy. A 2-D echocardiogram is planned for more objective cardiac structural assessment. Followup arranged to review.

## 2010-12-11 NOTE — Progress Notes (Signed)
Clinical Summary [name] is a 55 y.o.female referred by Dr. Gerda Diss for cardiology consultation. She has been evaluated in the past by Dr. Dietrich Pates.  Records indicate a long-standing history of hypertension, recent followup ECG with increased voltage and leftward axis. The patient states that she had been having some episodes of intermittent chest pain, overall atypical by her description, associated with anxiety and what she terms "panic attacks." She reports compliance with her medications. Also states that she is under stress at work.  Lab work from September showed total cholesterol 181, triglycerides 77, HDL 55, LDL 111, AST 26, ALT 24.  She has no personal history of obstructive CAD or myocardial infarction, reassuring stress testing noted possibly 5 years ago.  She reports NYHA class II dyspnea on exertion, not changed significantly, no orthopnea or PND. No progressive lower extremity edema.   No Known Allergies  Medication list reviewed.  Past Medical History  Diagnosis Date  . BMI 30.0-30.9,adult 2008 182 LBS    2009 194 LBS  . Helicobacter pylori gastritis AUG 2008 PREVPAC: nausea-->HELIDAC: GI UPSET    HB 14.1 NL HFP H. PYLORI STOOL Ag NEG  . Colon polyp APR 2008  . Anxiety   . Essential hypertension, benign   . Hypothyroidism   . Mixed hyperlipidemia   . Irritable bowel syndrome   . Tubulovillous adenoma 08/07/10    Colonoscopy w/ Dr Darrick Penna w/ 7 polyps removed-bx showed tubular adenomas.  Largest 1cm-hepatic flexure polyp->TA.      Past Surgical History  Procedure Date  . Colonoscopy APR 2008    AC/[place] SIMPLE ADENOMA  . Upper gastrointestinal endoscopy AUG 2008 PAIN/NAUSEA    H. PYLORI  . Cholecystectomy     Family History  Problem Relation Age of Onset  . Colon cancer Neg Hx   . Coronary artery disease Mother   . Coronary artery disease Brother     Age 82    Social History [name] reports that she has never smoked. She has never used smokeless  tobacco. [name] reports that she does not drink alcohol.  Review of Systems As noted above, otherwise negative.  Physical Examination Filed Vitals:   12/11/10 1528  BP: 138/82  Pulse: 85  Resp:    Overweight woman in no acute distress.  HEENT: Conjunctiva and lids normal, oropharynx with moist mucosa. Neck: Supple, no elevated JVP or carotid bruits, no thyromegaly. Lungs: Clear to auscultation, nonlabored. Cardiac: Regular rate and rhythm, soft S4, no rub or gallop. Abdomen: Soft, nontender, bowel sounds present. Skin: Warm and dry. Musculoskeletal: No kyphosis. Extremities: No pitting edema, distal pulses full. Neuropsychiatric: Patient anxious, alert and oriented x3, moves all extremities equally.   Studies Adenosine Myoview 05/31/2005: No diagnostic ST segment changes, normal perfusion without evidence of scar or ischemia, LVEF 77%.   Problem List and Plan

## 2010-12-11 NOTE — Assessment & Plan Note (Signed)
Atypical in description, possibly related to anxiety based on her description. Previous ischemic evaluation was reassuring.

## 2010-12-11 NOTE — Assessment & Plan Note (Signed)
Medical regimen looks reasonable for now. Plan to followup on the echocardiogram results.

## 2010-12-11 NOTE — Patient Instructions (Signed)
Your physician has requested that you have an echocardiogram. Echocardiography is a painless test that uses sound waves to create images of your heart. It provides your doctor with information about the size and shape of your heart and how well your heart's chambers and valves are working. This procedure takes approximately one hour. There are no restrictions for this procedure.  Your physician recommends that you continue on your current medications as directed. Please refer to the Current Medication list given to you today.  Your physician recommends that you schedule a follow-up appointment in: 3 weeks

## 2010-12-12 ENCOUNTER — Ambulatory Visit: Payer: BC Managed Care – PPO | Admitting: Cardiology

## 2010-12-12 ENCOUNTER — Ambulatory Visit (HOSPITAL_COMMUNITY)
Admission: RE | Admit: 2010-12-12 | Discharge: 2010-12-12 | Disposition: A | Discharge status: Home / Self Care | Payer: BC Managed Care – PPO | Source: Ambulatory Visit | Attending: Cardiology | Admitting: Cardiology

## 2010-12-12 DIAGNOSIS — R9431 Abnormal electrocardiogram [ECG] [EKG]: Secondary | ICD-10-CM

## 2010-12-12 DIAGNOSIS — I10 Essential (primary) hypertension: Secondary | ICD-10-CM | POA: Insufficient documentation

## 2010-12-12 DIAGNOSIS — R079 Chest pain, unspecified: Secondary | ICD-10-CM | POA: Insufficient documentation

## 2010-12-12 DIAGNOSIS — I517 Cardiomegaly: Secondary | ICD-10-CM

## 2010-12-12 NOTE — Progress Notes (Signed)
*  PRELIMINARY RESULTS* Echocardiogram 2D Echocardiogram has been performed.  Kaitlin [name] 12/12/2010, 1:54 PM

## 2010-12-15 ENCOUNTER — Telehealth: Payer: Self-pay | Admitting: Cardiology

## 2010-12-15 NOTE — Telephone Encounter (Signed)
Would like results of 2 D Echo done on Tuesday. /tg

## 2010-12-21 ENCOUNTER — Encounter: Payer: Self-pay | Admitting: Cardiology

## 2010-12-28 ENCOUNTER — Ambulatory Visit (INDEPENDENT_AMBULATORY_CARE_PROVIDER_SITE_OTHER): Payer: BC Managed Care – PPO | Admitting: Cardiology

## 2010-12-28 ENCOUNTER — Encounter: Payer: Self-pay | Admitting: Cardiology

## 2010-12-28 DIAGNOSIS — R079 Chest pain, unspecified: Secondary | ICD-10-CM

## 2010-12-28 DIAGNOSIS — I10 Essential (primary) hypertension: Secondary | ICD-10-CM

## 2010-12-28 NOTE — Progress Notes (Signed)
Clinical Summary Kaitlin [name] is a 55 y.o.female presenting for followup. She was just recently seen in consultation earlier in the month.  Recent echocardiogram from October 9 showed mild septal hypertrophy with normal LVEF of 60-65%, no wall motion abnormalities, mildly calcified aortic annulus and mitral annulus, no significant stenotic or regurgitant lesions. We reviewed the results. Reassuring that her LVEF is normal, and she does not have evidence of HOCM. Main issue seems to be consistent control of blood pressure, diet, and exercise.  She still has intermittent chest pain symptoms, specifically related to anxiety and stress, otherwise nonexertional. I have asked her to be observant for any progression or change, otherwise continue to follow up with Dr.Luking.   No Known Allergies  Medication list reviewed.  Past Medical History  Diagnosis Date  . BMI 30.0-30.9,adult 2008 182 LBS    2009 194 LBS  . Helicobacter pylori gastritis AUG 2008 PREVPAC: nausea-->HELIDAC: GI UPSET    HB 14.1 NL HFP H. PYLORI STOOL Ag NEG  . Colon polyp APR 2008  . Anxiety   . Essential hypertension, benign   . Hypothyroidism   . Mixed hyperlipidemia   . Irritable bowel syndrome   . Tubulovillous adenoma 08/07/10    Colonoscopy w/ Dr Darrick Penna w/ 7 polyps removed-bx showed tubular adenomas.  Largest 1cm-hepatic flexure polyp->TA.      Social History Kaitlin [name] reports that she has never smoked. She has never used smokeless tobacco. Kaitlin [name] reports that she does not drink alcohol.  Review of Systems No palpitations, no orthopnea or PND. Otherwise reviewed and negative.  Physical Examination Filed Vitals:   12/28/10 0919  BP: 140/84  Pulse: 86  Resp: 16    Overweight woman in no acute distress.  HEENT: Conjunctiva and lids normal, oropharynx with moist mucosa.  Neck: Supple, no elevated JVP or carotid bruits, no thyromegaly.  Lungs: Clear to auscultation, nonlabored.  Cardiac: Regular rate and  rhythm, soft S4, no rub or gallop.  Abdomen: Soft, nontender, bowel sounds present.  Skin: Warm and dry.  Musculoskeletal: No kyphosis.     Problem List and Plan

## 2010-12-28 NOTE — Assessment & Plan Note (Signed)
Atypical, and noted in setting of anxiety and stress, not specifically with exertion. I asked her to continue to be observant for any change or progression, and follow up with Dr. Gerda Diss. If her symptoms progress, a followup Myoview could be considered.

## 2010-12-28 NOTE — Patient Instructions (Signed)
Your physician recommends that you continue on your current medications as directed. Please refer to the Current Medication list given to you today.  Your physician recommends that you schedule a follow-up appointment in: as needed  

## 2010-12-28 NOTE — Assessment & Plan Note (Signed)
Medical therapy is actually fairly reasonable in light of her LVH. We also discussed importance of diet and sodium restriction, exercise. I asked her to continue regular followup with Dr. Gerda Diss.

## 2011-01-16 ENCOUNTER — Other Ambulatory Visit: Payer: Self-pay | Admitting: Obstetrics & Gynecology

## 2011-01-16 DIAGNOSIS — Z139 Encounter for screening, unspecified: Secondary | ICD-10-CM

## 2011-02-20 ENCOUNTER — Encounter (HOSPITAL_COMMUNITY): Payer: Self-pay | Admitting: Pharmacy Technician

## 2011-02-22 ENCOUNTER — Encounter (HOSPITAL_COMMUNITY): Admission: RE | Admit: 2011-02-22 | Payer: BC Managed Care – PPO | Source: Ambulatory Visit

## 2011-02-23 ENCOUNTER — Encounter (HOSPITAL_COMMUNITY)
Admission: RE | Admit: 2011-02-23 | Discharge: 2011-02-23 | Disposition: A | Discharge status: Home / Self Care | Payer: BC Managed Care – PPO | Source: Ambulatory Visit | Attending: General Surgery | Admitting: General Surgery

## 2011-02-23 ENCOUNTER — Encounter (HOSPITAL_COMMUNITY): Payer: Self-pay

## 2011-02-23 LAB — CBC
HCT: 42.2 % (ref 36.0–46.0)
Hemoglobin: 14 g/dL (ref 12.0–15.0)
MCH: 28.6 pg (ref 26.0–34.0)
RBC: 4.89 MIL/uL (ref 3.87–5.11)

## 2011-02-23 LAB — DIFFERENTIAL
Lymphs Abs: 2.8 10*3/uL (ref 0.7–4.0)
Monocytes Absolute: 0.4 10*3/uL (ref 0.1–1.0)
Monocytes Relative: 5 % (ref 3–12)
Neutro Abs: 4.2 10*3/uL (ref 1.7–7.7)
Neutrophils Relative %: 57 % (ref 43–77)

## 2011-02-23 LAB — BASIC METABOLIC PANEL
BUN: 15 mg/dL (ref 6–23)
Chloride: 102 mEq/L (ref 96–112)
GFR calc Af Amer: >90 mL/min (ref >90)
GFR calc non Af Amer: >90 mL/min (ref >90)
Glucose, Bld: 109 mg/dL — ABNORMAL HIGH (ref 70–99)
Potassium: 3.6 mEq/L (ref 3.5–5.1)
Sodium: 138 mEq/L (ref 135–145)

## 2011-02-23 MED ORDER — CHLORHEXIDINE GLUCONATE 4 % EX LIQD
1.0000 "application " | Freq: Once | CUTANEOUS | Status: DC
  Filled 2011-02-23: qty 15

## 2011-02-23 NOTE — Patient Instructions (Signed)
20 Kaitlin [name]  02/23/2011   Your procedure is scheduled on:  Wednesday, 02/28/11  Report to [facility] [place] [facility].  Call this number if you have problems the morning of surgery: [phone]   Remember:   Do not eat food:After Midnight.  May have clear liquids:until Midnight .  Clear liquids include soda, tea, black coffee, apple or grape juice, broth.  Take these medicines the morning of surgery with A SIP OF WATER: verapamil, levothyroxine, and xanax   Do not wear jewelry, make-up or nail polish.  Do not wear lotions, powders, or perfumes. You may wear deodorant.  Do not shave 48 hours prior to surgery.  Do not bring valuables to the hospital.  Contacts, dentures or bridgework may not be worn into surgery.  Leave suitcase in the car. After surgery it may be brought to your room.  For patients admitted to the hospital, checkout time is 11:00 AM the day of discharge.   Patients discharged the day of surgery will not be allowed to drive home.  Name and phone number of your driver: husband  Special Instructions: CHG Shower Use Special Wash: 1/2 bottle night before surgery and 1/2 bottle morning of surgery.   Please read over the following fact sheets that you were given: Pain Booklet, MRSA Information, Surgical Site Infection Prevention, Anesthesia Post-op Instructions and Care and Recovery After Surgery   Excision of Skin Lesions Excision of a skin lesion refers to the removal of a section of skin by making small cuts (incisions) in the skin. This is typically done to remove a cancerous growth (basal cell carcinoma, squamous cell carcinoma, or melanoma) or a noncancerous growth (cyst). It may be done to treat or prevent cancer or infection. It may also be done to improve cosmetic appearance (removal of mole, skin tag). LET YOUR CAREGIVER KNOW ABOUT:   Allergies to food or medicine.   Medicines taken, including vitamins, herbs, eyedrops, over-the-counter medicines, and creams.    Use of steroids (by mouth or creams).   Previous problems with anesthetics or numbing medicines.   History of bleeding problems or blood clots.   History of any prostheses.   Previous surgery.   Other health problems, including diabetes and kidney problems.   Possibility of pregnancy, if this applies.  RISKS AND COMPLICATIONS  Many complications can be managed. With appropriate treatment and rehabilitation, the following complications are very uncommon:  Bleeding.   Infection.   Scarring.   Recurrence of cyst or cancer.   Changes in skin sensation or appearance (discoloration, swelling).   Reaction to anesthesia.   Allergic reaction to surgical materials or ointments.   Damage to nerves, blood vessels, muscles, or other structures.   Continued pain.  BEFORE THE PROCEDURE  It is important to follow your caregiver's instructions prior to your procedure to avoid complications. Steps before your procedure may include:  Physical exam, blood tests, other procedures, such as removing a small sample for examination under a microscope (biopsy).   Your caregiver may review the procedure, the anesthesia being used, and what to expect after the procedure with you.  You may be asked to:  Stop taking certain medicines, such as blood thinners (including aspirin, clopidogrel, ibuprofen), for several days prior to your procedure.   Take certain medicines.   Stop smoking.  It is a good idea to arrange for a ride home after surgery and to have someone to help you with activities during recovery. PROCEDURE  There are several excision  techniques. The type of excision or surgical technique used will depend on your condition, the location of the lesion, and your overall health. After the lesion is sterilized and a local anesthetic is applied, the following may be performed: Complete surgical excision The area to be removed is marked with a pen. Using a small scalpel and scissors, the  surgeon gently cuts around and under the lesion until it is completely removed. The lesion is placed in a special fluid and sent to the lab for examination. If necessary, bleeding will be controlled with a device that delivers heat. The edges of the wound are stitched together and a dressing is applied. This procedure may be performed to treat a cancerous growth or noncancerous cyst or lesion. Surgeons commonly perform an elliptical excision, to minimize scarring. Excision of a cyst The surgeon makes an incision on the cyst. The entire cyst is removed through the incision. The wound may be closed with a suture (stitch). Shave excision During shave excision, the surgeon uses a small blade or loop instrument to shave off the lesion. This may be done to remove a mole or skin tag. The wound is usually left to heal on its own without stitches. Punch excision During punch excision, the surgeon uses a small, round tool (like a cookie cutter) to cut a circle shape out of the skin. The outer edges of the skin are stitched together. This may be done to remove a mole or scar or to perform a biopsy of the lesion. Mohs micrographic surgery During Mohs micrographic surgery, layers of the lesion are removed with a scalpel or loop instrument and immediately examined under a microscope until all of the abnormal or cancerous tissue is removed. This procedure is minimally invasive and ensures the best cosmetic outcome, with removal of as little normal tissue as possible. Mohs is usually done to treat skin cancer, such as basal cell carcinoma or squamous cell carcinoma, particularly on the face and ears. Antibiotic ointment is applied to the surgical area after each of the procedures listed above, as necessary. AFTER THE PROCEDURE  How well you heal depends on many factors. Most patients heal quite well with proper techniques and self-care. Scarring will lessen over time. HOME CARE INSTRUCTIONS   Take medicines for pain  as directed.   Keep the incision area clean, dry, and protected for at least 48 hours. Change dressings as directed.   For bleeding, apply gentle but firm pressure to the wound using a folded towel for 20 minutes. Call your caregiver if bleeding does not stop.   Avoid high-impact exercise and activities until the stitches are removed or the area heals.   Follow your caregiver's instructions to minimize scarring. Avoid sun exposure until the area has healed. Scarring should lessen over time.   Follow up with your caregiver as directed. Removal of stitches within 4 to 14 days may be necessary.  Finding out the results of your test Not all test results are available during your visit. If your test results are not back during the visit, make an appointment with your caregiver to find out the results. Do not assume everything is normal if you have not heard from your caregiver or the medical facility. It is important for you to follow up on all of your test results. SEEK MEDICAL CARE IF:   You or your child has an oral temperature above 102 F (38.9 C).   You develop signs of infection (chills, feeling unwell).   You  notice bleeding, pain, discharge, redness, or swelling at the incision site.   You notice skin irregularities or changes in sensation.  MAKE SURE YOU:   Understand these instructions.   Will watch your condition.   Will get help right away if you are not doing well or get worse.  FOR MORE INFORMATION  American Academy [employer]: www.https://powers.com/ American Academy of Dermat[employer]: InfoExam.si Document Released: 05/16/2009 Document Revised: 11/01/2010 Document Reviewed: 05/16/2009 [facility] Patient Information 2012 [place], [place].

## 2011-02-28 ENCOUNTER — Encounter (HOSPITAL_COMMUNITY): Payer: Self-pay | Admitting: Anesthesiology

## 2011-02-28 ENCOUNTER — Other Ambulatory Visit: Payer: Self-pay | Admitting: General Surgery

## 2011-02-28 ENCOUNTER — Ambulatory Visit (HOSPITAL_COMMUNITY): Payer: BC Managed Care – PPO | Admitting: Anesthesiology

## 2011-02-28 ENCOUNTER — Encounter (HOSPITAL_COMMUNITY)
Admission: RE | Disposition: A | Discharge status: Home / Self Care | Payer: Self-pay | Source: Ambulatory Visit | Attending: General Surgery

## 2011-02-28 ENCOUNTER — Encounter (HOSPITAL_COMMUNITY): Payer: Self-pay | Admitting: *Deleted

## 2011-02-28 ENCOUNTER — Ambulatory Visit (HOSPITAL_COMMUNITY)
Admission: RE | Admit: 2011-02-28 | Discharge: 2011-02-28 | Disposition: A | Discharge status: Home / Self Care | Payer: BC Managed Care – PPO | Source: Ambulatory Visit | Attending: General Surgery | Admitting: General Surgery

## 2011-02-28 DIAGNOSIS — D1779 Benign lipomatous neoplasm of other sites: Secondary | ICD-10-CM | POA: Insufficient documentation

## 2011-02-28 DIAGNOSIS — M7989 Other specified soft tissue disorders: Secondary | ICD-10-CM

## 2011-02-28 DIAGNOSIS — Z79899 Other long term (current) drug therapy: Secondary | ICD-10-CM | POA: Insufficient documentation

## 2011-02-28 DIAGNOSIS — I10 Essential (primary) hypertension: Secondary | ICD-10-CM | POA: Insufficient documentation

## 2011-02-28 HISTORY — PX: MASS EXCISION: SHX2000

## 2011-02-28 SURGERY — EXCISION MASS
Anesthesia: General | Site: Axilla | Laterality: Right | Wound class: Clean

## 2011-02-28 MED ORDER — MIDAZOLAM HCL 2 MG/2ML IJ SOLN
1.0000 mg | INTRAMUSCULAR | Status: DC | PRN
  Administered 2011-02-28 (×2): 2 mg via INTRAVENOUS

## 2011-02-28 MED ORDER — BACITRACIN ZINC 500 UNIT/GM EX OINT
TOPICAL_OINTMENT | CUTANEOUS | Status: AC
  Filled 2011-02-28: qty 0.9

## 2011-02-28 MED ORDER — DEXTROSE 5 % IV SOLN
1.0000 g | INTRAVENOUS | Status: AC
  Administered 2011-02-28: 1 g via INTRAVENOUS

## 2011-02-28 MED ORDER — HYDROCODONE-ACETAMINOPHEN 5-325 MG PO TABS: 1.0000 | ORAL_TABLET | ORAL | Status: AC | PRN

## 2011-02-28 MED ORDER — FENTANYL CITRATE 0.05 MG/ML IJ SOLN
INTRAMUSCULAR | Status: AC
  Administered 2011-02-28: 100 ug via INTRAVENOUS
  Filled 2011-02-28: qty 2

## 2011-02-28 MED ORDER — PROPOFOL 10 MG/ML IV EMUL
INTRAVENOUS | Status: DC | PRN
  Administered 2011-02-28: 30 mg via INTRAVENOUS
  Administered 2011-02-28: 140 mg via INTRAVENOUS

## 2011-02-28 MED ORDER — SODIUM CHLORIDE 0.9 % IR SOLN
Status: DC | PRN
  Administered 2011-02-28: 1000 mL

## 2011-02-28 MED ORDER — MIDAZOLAM HCL 2 MG/2ML IJ SOLN
INTRAMUSCULAR | Status: AC
  Administered 2011-02-28: 2 mg via INTRAVENOUS
  Filled 2011-02-28: qty 2

## 2011-02-28 MED ORDER — FENTANYL CITRATE 0.05 MG/ML IJ SOLN
INTRAMUSCULAR | Status: AC
  Administered 2011-02-28: 50 ug via INTRAVENOUS
  Filled 2011-02-28: qty 2

## 2011-02-28 MED ORDER — ROCURONIUM BROMIDE 50 MG/5ML IV SOLN
INTRAVENOUS | Status: AC
  Filled 2011-02-28: qty 1

## 2011-02-28 MED ORDER — ONDANSETRON HCL 4 MG/2ML IJ SOLN
4.0000 mg | Freq: Once | INTRAMUSCULAR | Status: AC
  Administered 2011-02-28: 4 mg via INTRAVENOUS

## 2011-02-28 MED ORDER — FENTANYL CITRATE 0.05 MG/ML IJ SOLN
INTRAMUSCULAR | Status: DC | PRN
  Administered 2011-02-28 (×2): 50 ug via INTRAVENOUS

## 2011-02-28 MED ORDER — MIDAZOLAM HCL 2 MG/2ML IJ SOLN
INTRAMUSCULAR | Status: AC
  Filled 2011-02-28: qty 2

## 2011-02-28 MED ORDER — CELECOXIB 100 MG PO CAPS
ORAL_CAPSULE | ORAL | Status: AC
  Administered 2011-02-28: 400 mg via ORAL
  Filled 2011-02-28: qty 4

## 2011-02-28 MED ORDER — LIDOCAINE HCL (PF) 1 % IJ SOLN
INTRAMUSCULAR | Status: AC
  Filled 2011-02-28: qty 5

## 2011-02-28 MED ORDER — ONDANSETRON HCL 4 MG/2ML IJ SOLN: 4.0000 mg | Freq: Once | INTRAMUSCULAR | Status: AC | PRN

## 2011-02-28 MED ORDER — ENOXAPARIN SODIUM 40 MG/0.4ML ~~LOC~~ SOLN
40.0000 mg | Freq: Once | SUBCUTANEOUS | Status: AC
  Administered 2011-02-28: 40 mg via SUBCUTANEOUS

## 2011-02-28 MED ORDER — LACTATED RINGERS IV SOLN
INTRAVENOUS | Status: DC
  Administered 2011-02-28: 1000 mL via INTRAVENOUS

## 2011-02-28 MED ORDER — BUPIVACAINE HCL (PF) 0.5 % IJ SOLN
INTRAMUSCULAR | Status: AC
  Filled 2011-02-28: qty 30

## 2011-02-28 MED ORDER — CELECOXIB 100 MG PO CAPS
400.0000 mg | ORAL_CAPSULE | Freq: Every day | ORAL | Status: AC
  Administered 2011-02-28: 400 mg via ORAL

## 2011-02-28 MED ORDER — FENTANYL CITRATE 0.05 MG/ML IJ SOLN
25.0000 ug | INTRAMUSCULAR | Status: DC | PRN
  Administered 2011-02-28 (×2): 50 ug via INTRAVENOUS
  Administered 2011-02-28: 100 ug via INTRAVENOUS
  Administered 2011-02-28: 50 ug via INTRAVENOUS

## 2011-02-28 MED ORDER — BUPIVACAINE HCL (PF) 0.5 % IJ SOLN
INTRAMUSCULAR | Status: DC | PRN
  Administered 2011-02-28: 10 mL

## 2011-02-28 MED ORDER — ONDANSETRON HCL 4 MG/2ML IJ SOLN
INTRAMUSCULAR | Status: AC
  Administered 2011-02-28: 4 mg via INTRAVENOUS
  Filled 2011-02-28: qty 2

## 2011-02-28 MED ORDER — PROPOFOL 10 MG/ML IV EMUL
INTRAVENOUS | Status: AC
  Filled 2011-02-28: qty 20

## 2011-02-28 MED ORDER — BACITRACIN ZINC 500 UNIT/GM EX OINT
TOPICAL_OINTMENT | CUTANEOUS | Status: DC | PRN
  Administered 2011-02-28: 1 via TOPICAL

## 2011-02-28 SURGICAL SUPPLY — 35 items
BAG HAMPER (MISCELLANEOUS) ×2 IMPLANT
BENZOIN TINCTURE PRP APPL 2/3 (GAUZE/BANDAGES/DRESSINGS) ×2 IMPLANT
CLOTH BEACON ORANGE TIMEOUT ST (SAFETY) ×2 IMPLANT
COVER LIGHT HANDLE STERIS (MISCELLANEOUS) ×4 IMPLANT
DURAPREP 26ML APPLICATOR (WOUND CARE) ×2 IMPLANT
ELECT NEEDLE TIP 2.8 STRL (NEEDLE) IMPLANT
ELECT REM PT RETURN 9FT ADLT (ELECTROSURGICAL) ×2
ELECTRODE REM PT RTRN 9FT ADLT (ELECTROSURGICAL) ×1 IMPLANT
FORMALIN 10 PREFIL 120ML (MISCELLANEOUS) ×2 IMPLANT
GLOVE BIOGEL PI IND STRL 7.0 (GLOVE) ×2 IMPLANT
GLOVE BIOGEL PI IND STRL 7.5 (GLOVE) ×1 IMPLANT
GLOVE BIOGEL PI INDICATOR 7.0 (GLOVE) ×2
GLOVE BIOGEL PI INDICATOR 7.5 (GLOVE) ×1
GLOVE ECLIPSE 6.5 STRL STRAW (GLOVE) ×2 IMPLANT
GLOVE ECLIPSE 7.0 STRL STRAW (GLOVE) ×2 IMPLANT
GOWN STRL REIN XL XLG (GOWN DISPOSABLE) ×6 IMPLANT
KIT ROOM TURNOVER APOR (KITS) ×2 IMPLANT
MANIFOLD NEPTUNE II (INSTRUMENTS) ×2 IMPLANT
NEEDLE HYPO 18GX1.5 BLUNT FILL (NEEDLE) IMPLANT
NEEDLE HYPO 25X1 1.5 SAFETY (NEEDLE) ×2 IMPLANT
NS IRRIG 1000ML POUR BTL (IV SOLUTION) ×2 IMPLANT
PACK MINOR (CUSTOM PROCEDURE TRAY) ×2 IMPLANT
PAD ARMBOARD 7.5X6 YLW CONV (MISCELLANEOUS) ×2 IMPLANT
SET BASIN LINEN APH (SET/KITS/TRAYS/PACK) ×2 IMPLANT
SOL PREP PROV IODINE SCRUB 4OZ (MISCELLANEOUS) IMPLANT
SPONGE GAUZE 4X4 12PLY (GAUZE/BANDAGES/DRESSINGS) ×2 IMPLANT
STRIP CLOSURE SKIN 1/2X4 (GAUZE/BANDAGES/DRESSINGS) ×2 IMPLANT
SUT MNCRL AB 4-0 PS2 18 (SUTURE) IMPLANT
SUT PROLENE 3 0 PS 1 (SUTURE) IMPLANT
SUT PROLENE 3 0 PS 2 (SUTURE) ×4 IMPLANT
SUT VIC AB 3-0 SH 27 (SUTURE) ×1
SUT VIC AB 3-0 SH 27X BRD (SUTURE) ×1 IMPLANT
SYR BULB IRRIGATION 50ML (SYRINGE) ×2 IMPLANT
SYR CONTROL 10ML LL (SYRINGE) ×2 IMPLANT
TAPE CLOTH SURG 4X10 WHT LF (GAUZE/BANDAGES/DRESSINGS) ×2 IMPLANT

## 2011-02-28 NOTE — Anesthesia Preprocedure Evaluation (Addendum)
Anesthesia Evaluation  Patient identified by MRN, date of birth, ID band Patient awake    Reviewed: Allergy & Precautions, H&P , NPO status , Patient's Chart, lab work & pertinent test results  History of Anesthesia Complications Negative for: history of anesthetic complications  Airway Mallampati: III TM Distance: <3 FB     Dental  (+) Teeth Intact and Missing   Pulmonary neg pulmonary ROS,    Pulmonary exam normal       Cardiovascular hypertension, Pt. on medications Regular Normal    Neuro/Psych Anxiety    GI/Hepatic   Endo/Other  Hypothyroidism   Renal/GU      Musculoskeletal   Abdominal   Peds  Hematology   Anesthesia Other Findings   Reproductive/Obstetrics                           Anesthesia Physical Anesthesia Plan  ASA: II  Anesthesia Plan: General   Post-op Pain Management:    Induction: Intravenous  Airway Management Planned: LMA  Additional Equipment:   Intra-op Plan:   Post-operative Plan: Extubation in OR  Informed Consent: I have reviewed the patients History and Physical, chart, labs and discussed the procedure including the risks, benefits and alternatives for the proposed anesthesia with the patient or authorized representative who has indicated his/her understanding and acceptance.     Plan Discussed with:   Anesthesia Plan Comments:         Anesthesia Quick Evaluation

## 2011-02-28 NOTE — Op Note (Signed)
Patient:  Kaitlin [name]  DOB:  1955-10-16  MRN:  [id number]   Preop Diagnosis:  Soft tissue mass of the right axilla  Postop Diagnosis:  The same  Procedure:  Excision of right axillary soft tissue mass via a 4 cm incision.  Surgeon:  Dr. Tilford Pillar  Anes:  General endotracheal  Indications:  Patient is a 55 year old female presented my office with a history of a mass of the right axilla. This is slowly increased in size. It is also continued increase in symptoms with more compression and discomfort in this area. I do suspect a benign lipomatous type of lesion on the excision was discussed at length patient. Risks benefits alternatives of excision were discussed including but not limited to risk of bleeding, infection, skin dehiscence, recurrence. Her questions and concerns were addressed the patient was consented for the planned procedure.  Procedure note:  Patient was taken to the or was placed into the supine position on the OR table. At this time the general anesthetic is administered and the patient is in endotracheally intubated by the nurse anesthetist. At this point her right axilla was prepped with DuraPrep solution and draped in standard fashion. A skin incision was created in the right axilla over the palpable soft tissue mass. Additional dissection down to subcuticular tissues carried using electrocautery. This is carried out down to the soft tissue mass which appears to be a lipomatous lesion. It is circumferentially dissected free using electrocautery. Hemostasis is excellent having been attainable electrocautery. Once free the specimen was placed in the back table and sent as a permanent specimen to pathology. At this point the wound is irrigated with copious amount of sterile saline. The deep subcuticular tissues reapproximated using a 3-0 Vicryl in simple interrupted fashion. The skin edges were also reapproximated using a 3-0 Prolene in simple or fashion. The skin was washed  dried a moistened dry towel. Bacitracin ointment was placed over the incision. Sterile dressings are placed the drapes removed the dressings were secured. Patient is a transferred back to the PACU in stable condition. At the conclusion of procedure all instrument, sponge, needle counts are correct. Patient tolerated procedure she well.  Complications:  None apparent  EBL:  Minimal  Specimen:  Soft tissue mass in the right axilla

## 2011-02-28 NOTE — H&P (Signed)
  NTS SOAP Note  Vital Signs:  Vitals as of: 02/15/2011: Systolic 161: Diastolic 83: Heart Rate 91: Temp 96.40F: Height 60ft 5in: Weight 200Lbs 0 Ounces: Pain Level 3: BMI 33  BMI : 33.28 kg/m2  Subjective: This 55 Years 1 Months old Female presents for of bump under right arm.  Patient has noted for the last couple of weeks.  No increase in size.  No sig change since first noted.  No fever or chills although she states she is going through menopause.  No wt changes.  No discharge from the area. Mild tenderness.  No skin changes.  No similar lesions in the past.    Review of Symptoms:  Constitutional:unremarkable Head:unremarkable Eyes:unremarkable Nose/Mouth/Throat:unremarkable Cardiovascular:unremarkable Respiratory:unremarkable GERD frequent arthralgias of  back, neck, joints dry skin Breast:unremarkable Hematolgic/Lymphatic:unremarkable tired   Past Medical History:Obtained   Past Medical History  Surgical History:none Medical Problems: HTN, hypothyroid, hyperchol,  Psychiatric History: anxiety Allergies: NKDA Medications: Alprazolam, verapamil, indopamide, levothyroxin, lovastatin, enalapril   Social History:Obtained   Social History  Preferred Language: English ([place]) Race:  Black or African American Ethnicity: Not Hispanic / Latino Age: 55 Years 0 Months Marital Status:  M Alcohol:none Recreational drug(s): none   Smoking Status: Never smoker reviewed on 02/15/2011  Family History:Obtained   Family History  Is there a family history of:  CAD, DM2    Objective Information: General:Well appearing, well nourished in no distress. obese Skin:no rash or prominent lesions except mobile, mildly tender, rubbery r axillary nodule. Head:Atraumatic; no masses; no abnormalities Eyes:conjunctiva clear, EOM intact, PERRL Mouth:Mucous membranes moist, no mucosal lesions. Neck:Supple  without lymphadenopathy.  Heart:RRR, no murmur Lungs:CTA bilaterally, no wheezes, rhonchi, rales.  Breathing unlabored. Abdomen:Soft, NT/ND, no HSM, no masses. Lymphatics:unremarkable  Assessment:  Diagnosis &amp; Procedure: DiagnosisCode: 782.2, ProcedureCode: 04540,    Plan: Soft tissue mass (R axilla).  Options discussed with patient.  She wishes to have it removed regardless of etiology due to concern for CA.  Will schedule at her convenience.  Patient Education:Alternative treatments to surgery were discussed with patient (and family).Risks and benefits  of procedure were fully explained to the patient (and family) who gave informed consent. Patient/family questions were addressed.  Follow-up:Pending Surgery

## 2011-02-28 NOTE — Transfer of Care (Signed)
Immediate Anesthesia Transfer of Care Note  Patient: Kaitlin [name]  Procedure(s) Performed:  EXCISION MASS - soft tissue mass  Patient Location: PACU  Anesthesia Type: General  Level of Consciousness: awake  Airway & Oxygen Therapy: Patient Spontanous Breathing and non-rebreather face mask  Post-op Assessment: Report given to PACU RN, Post -op Vital signs reviewed and stable and Patient moving all extremities  Post vital signs: Reviewed and stable  Complications: No apparent anesthesia complications

## 2011-02-28 NOTE — Interval H&P Note (Signed)
History and Physical Interval Note:  02/28/2011 7:50 AM  Kaitlin [name]  has presented today for surgery, with the diagnosis of Soft tissue mass right axilla  The various methods of treatment have been discussed with the patient and family. After consideration of risks, benefits and other options for treatment, the patient has consented to  Procedure(s): EXCISION MASS as a surgical intervention .  The patients' history has been reviewed, patient examined, no change in status, stable for surgery.  I have reviewed the patients' chart and labs.  Questions were answered to the patient's satisfaction.     Laury Huizar C

## 2011-02-28 NOTE — Anesthesia Procedure Notes (Signed)
Procedure Name: LMA Insertion Date/Time: 02/28/2011 8:03 AM Performed by: Minerva Areola Pre-anesthesia Checklist: Patient identified, Patient being monitored, Emergency Drugs available, Timeout performed and Suction available Patient Re-evaluated:Patient Re-evaluated prior to inductionOxygen Delivery Method: Circle System Utilized Preoxygenation: Pre-oxygenation with 100% oxygen Intubation Type: IV induction Ventilation: Mask ventilation without difficulty LMA: LMA inserted LMA Size: 4.0 Number of attempts: 1 Placement Confirmation: positive ETCO2 and breath sounds checked- equal and bilateral

## 2011-02-28 NOTE — Anesthesia Postprocedure Evaluation (Signed)
Anesthesia Post Note  Patient: Kaitlin [name]  Procedure(s) Performed:  EXCISION MASS - soft tissue mass  Anesthesia type: General  Patient location: PACU  Post pain: Pain level controlled  Post assessment: Post-op Vital signs reviewed, Patient's Cardiovascular Status Stable, Respiratory Function Stable, Patent Airway, No signs of Nausea or vomiting and Pain level controlled  Last Vitals:  Filed Vitals:   02/28/11 0857  BP: 140/74  Pulse: 85  Temp: 36.4 C  Resp: 18    Post vital signs: Reviewed and stable  Level of consciousness: awake and alert   Complications: No apparent anesthesia complications

## 2011-03-05 ENCOUNTER — Encounter (HOSPITAL_COMMUNITY): Payer: Self-pay | Admitting: General Surgery

## 2011-03-05 ENCOUNTER — Ambulatory Visit (HOSPITAL_COMMUNITY)
Admission: RE | Admit: 2011-03-05 | Discharge: 2011-03-05 | Disposition: A | Discharge status: Home / Self Care | Payer: BC Managed Care – PPO | Source: Ambulatory Visit | Attending: Obstetrics & Gynecology | Admitting: Obstetrics & Gynecology

## 2011-03-05 DIAGNOSIS — Z1231 Encounter for screening mammogram for malignant neoplasm of breast: Secondary | ICD-10-CM | POA: Insufficient documentation

## 2011-03-05 DIAGNOSIS — Z139 Encounter for screening, unspecified: Secondary | ICD-10-CM

## 2011-03-05 IMAGING — MG MM DIGITAL SCREENING BILAT
5 series · 5 of 5 positions shown · non-contrast
Comparison: none

DG SCREEN MAMMOGRAM BILATERAL
Bilateral CC and MLO view(s) were taken.
Technologist: [REDACTED]

DIGITAL SCREENING MAMMOGRAM WITH CAD:
There are scattered fibroglandular densities.  Post operative changes are noted in the right 
axilla.  No masses or malignant type calcifications are identified.  Compared with prior studies.
Images were processed with CAD.

[L CC]
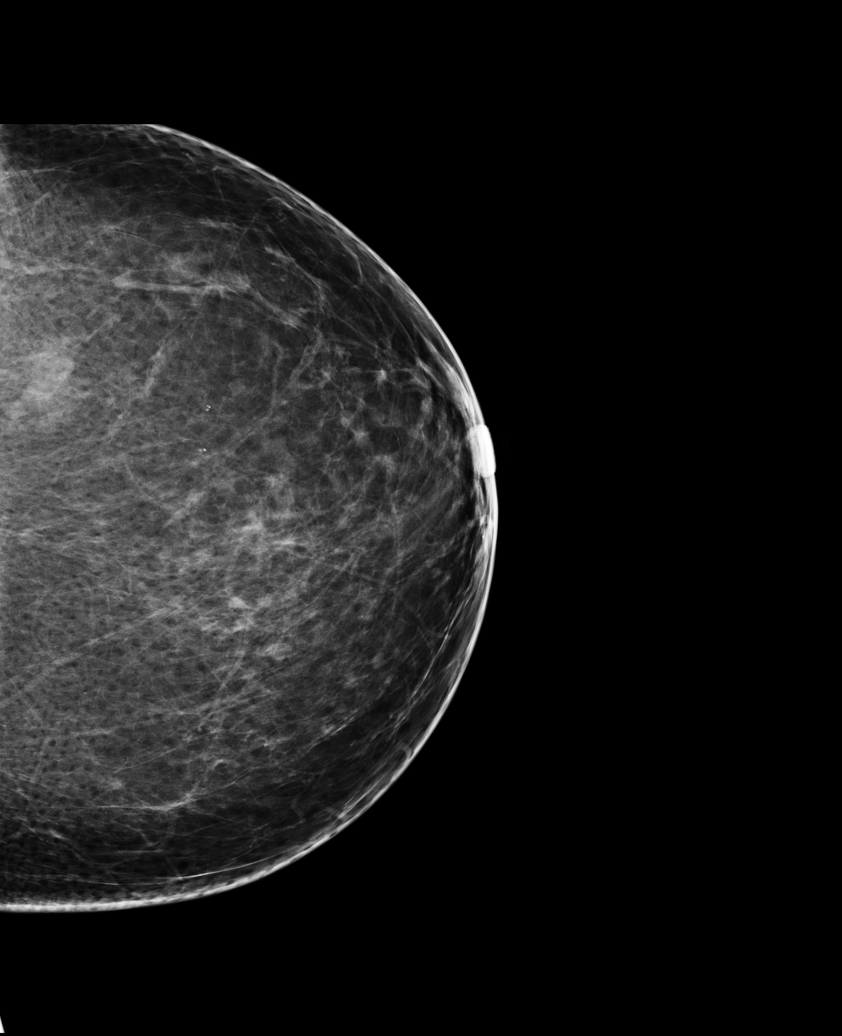

[L MLO]
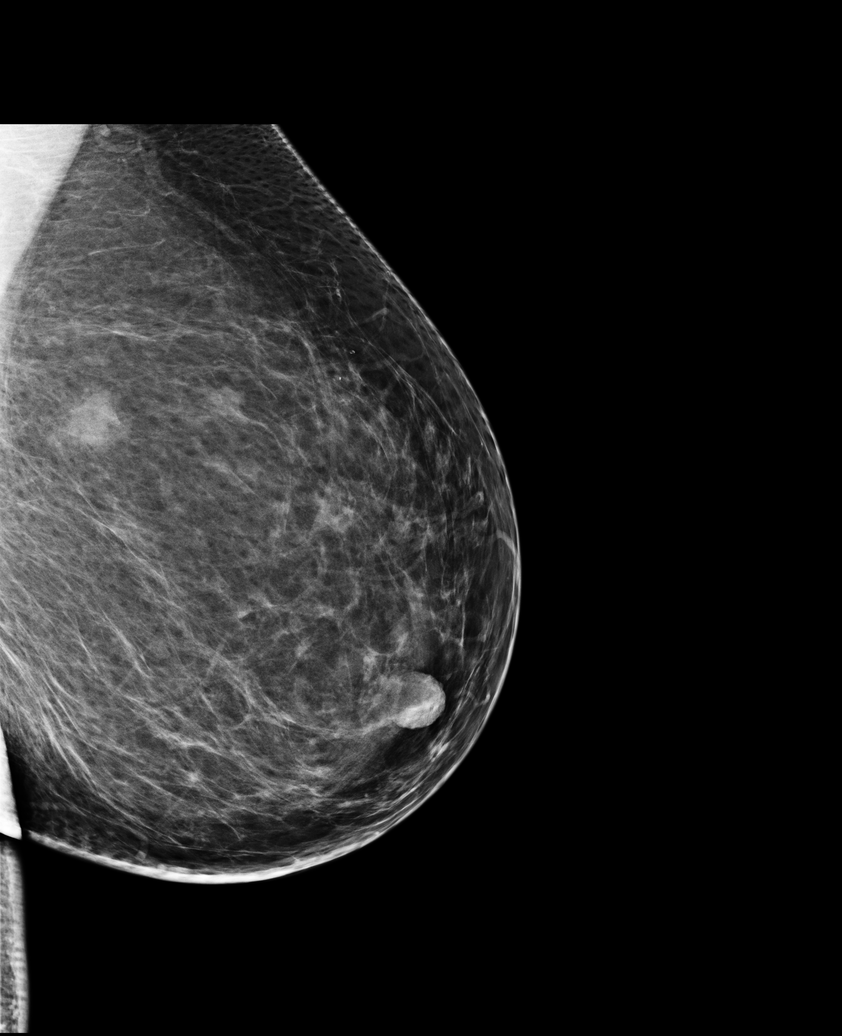

[R CC]
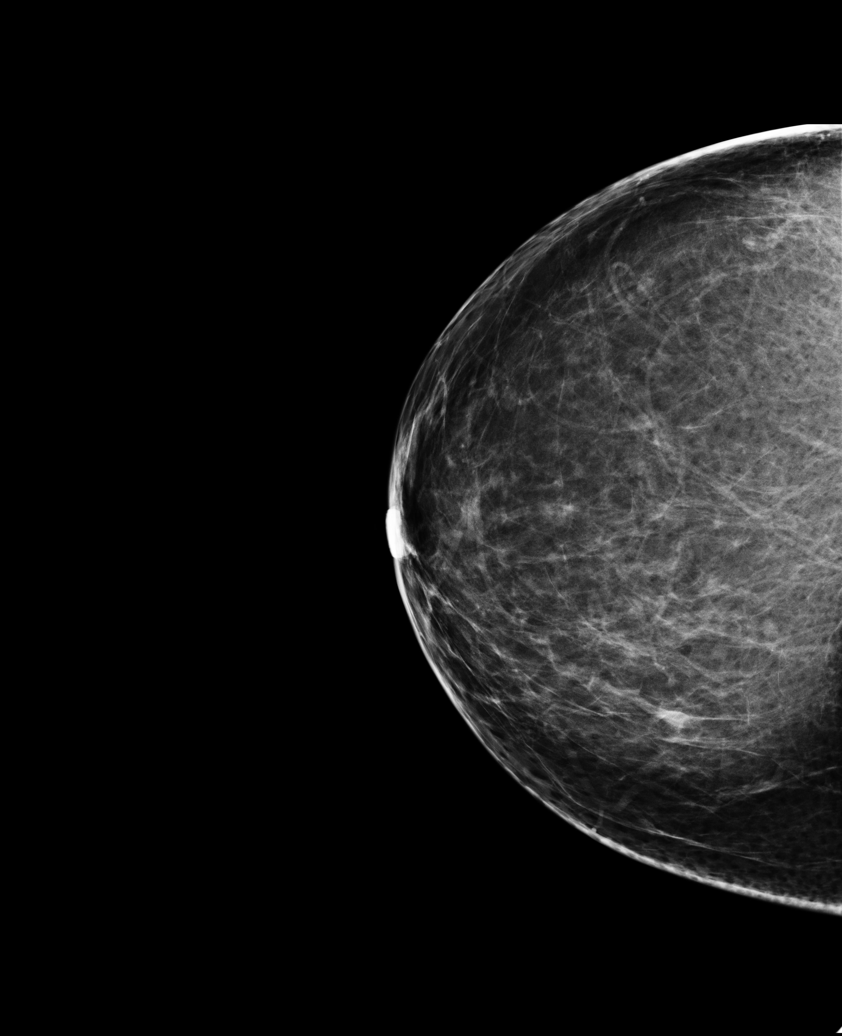

[R MLO (1 of 2)]
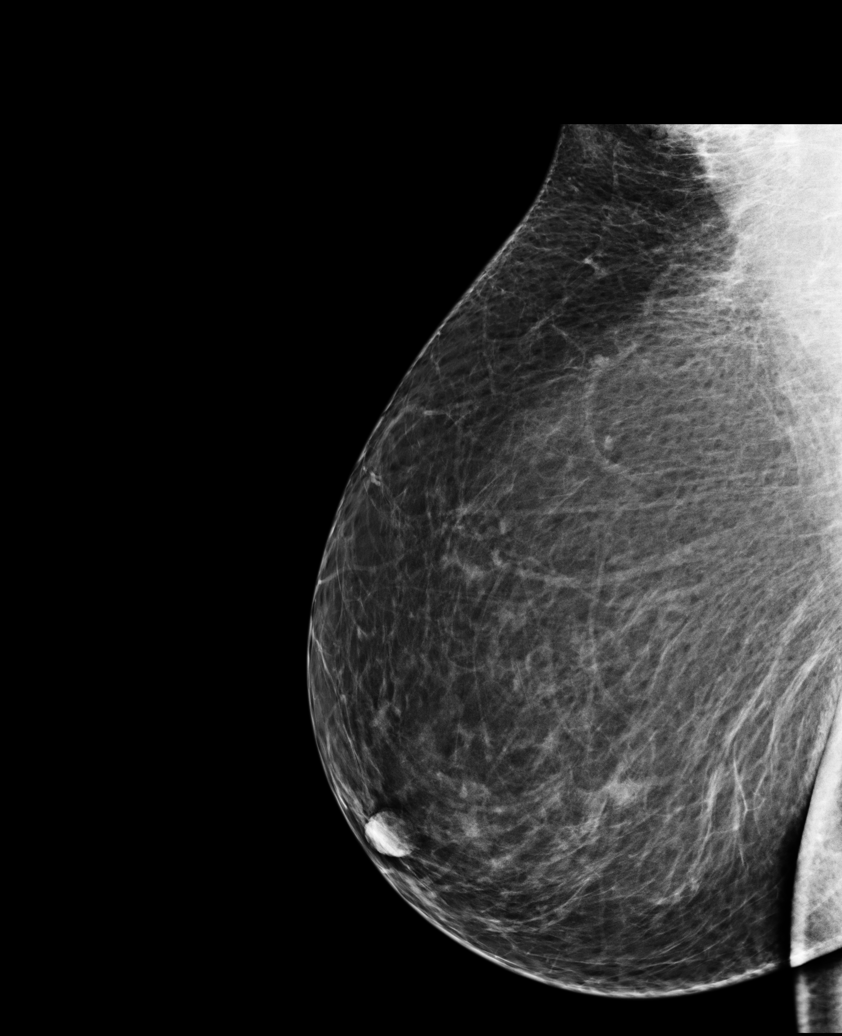

[R MLO (2 of 2)]
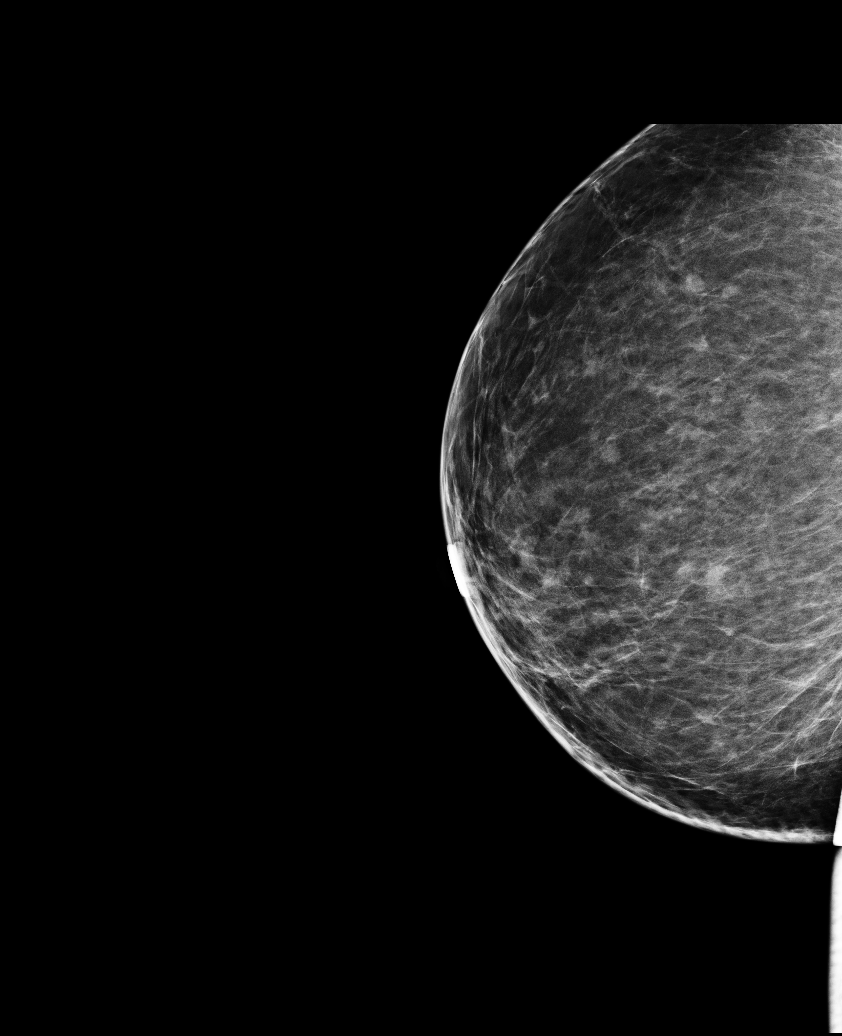

[5 of 5 positions shown; findings below may reference images not displayed]

IMPRESSION: No specific mammographic evidence of malignancy.  Next screening mammogram is recommended in one 
year.

A result letter of this screening mammogram will be mailed directly to the patient.

ASSESSMENT: Negative - BI-RADS 1

Screening mammogram in 1 year.
,

## 2011-03-08 ENCOUNTER — Ambulatory Visit (HOSPITAL_COMMUNITY): Payer: BC Managed Care – PPO

## 2011-05-10 ENCOUNTER — Emergency Department (HOSPITAL_COMMUNITY): Payer: BC Managed Care – PPO

## 2011-05-10 ENCOUNTER — Encounter (HOSPITAL_COMMUNITY): Payer: Self-pay | Admitting: *Deleted

## 2011-05-10 ENCOUNTER — Emergency Department (HOSPITAL_COMMUNITY)
Admission: EM | Admit: 2011-05-10 | Discharge: 2011-05-10 | Disposition: A | Discharge status: Home / Self Care | Payer: BC Managed Care – PPO | Attending: Emergency Medicine | Admitting: Emergency Medicine

## 2011-05-10 DIAGNOSIS — M719 Bursopathy, unspecified: Secondary | ICD-10-CM | POA: Insufficient documentation

## 2011-05-10 DIAGNOSIS — M7580 Other shoulder lesions, unspecified shoulder: Secondary | ICD-10-CM

## 2011-05-10 DIAGNOSIS — M25519 Pain in unspecified shoulder: Secondary | ICD-10-CM | POA: Insufficient documentation

## 2011-05-10 DIAGNOSIS — Z79899 Other long term (current) drug therapy: Secondary | ICD-10-CM | POA: Insufficient documentation

## 2011-05-10 DIAGNOSIS — M67919 Unspecified disorder of synovium and tendon, unspecified shoulder: Secondary | ICD-10-CM | POA: Insufficient documentation

## 2011-05-10 IMAGING — CR DG SHOULDER 2+V*R*
3 series · 3 of 3 positions shown · non-contrast
Comparison: None

CLINICAL DATA: Right shoulder pain

RIGHT SHOULDER - 2+ VIEW

[view not recorded (1 of 3)]
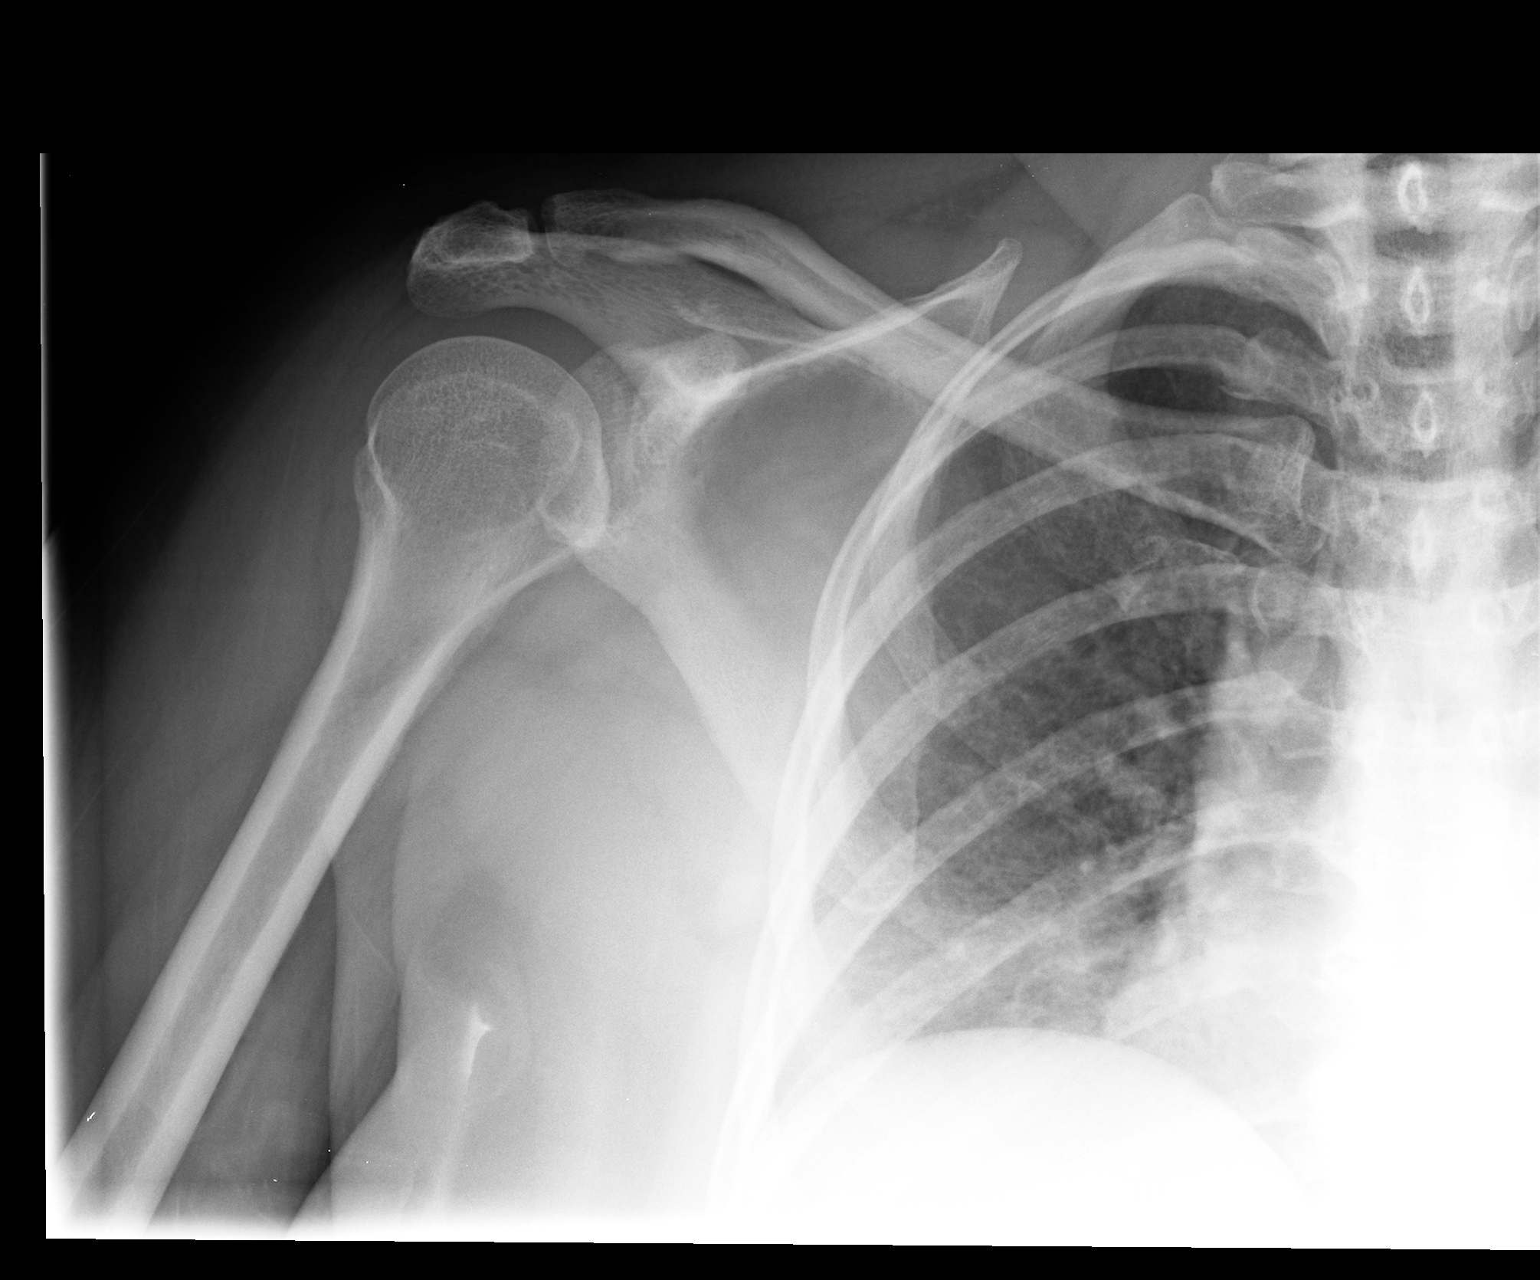

[view not recorded (2 of 3)]
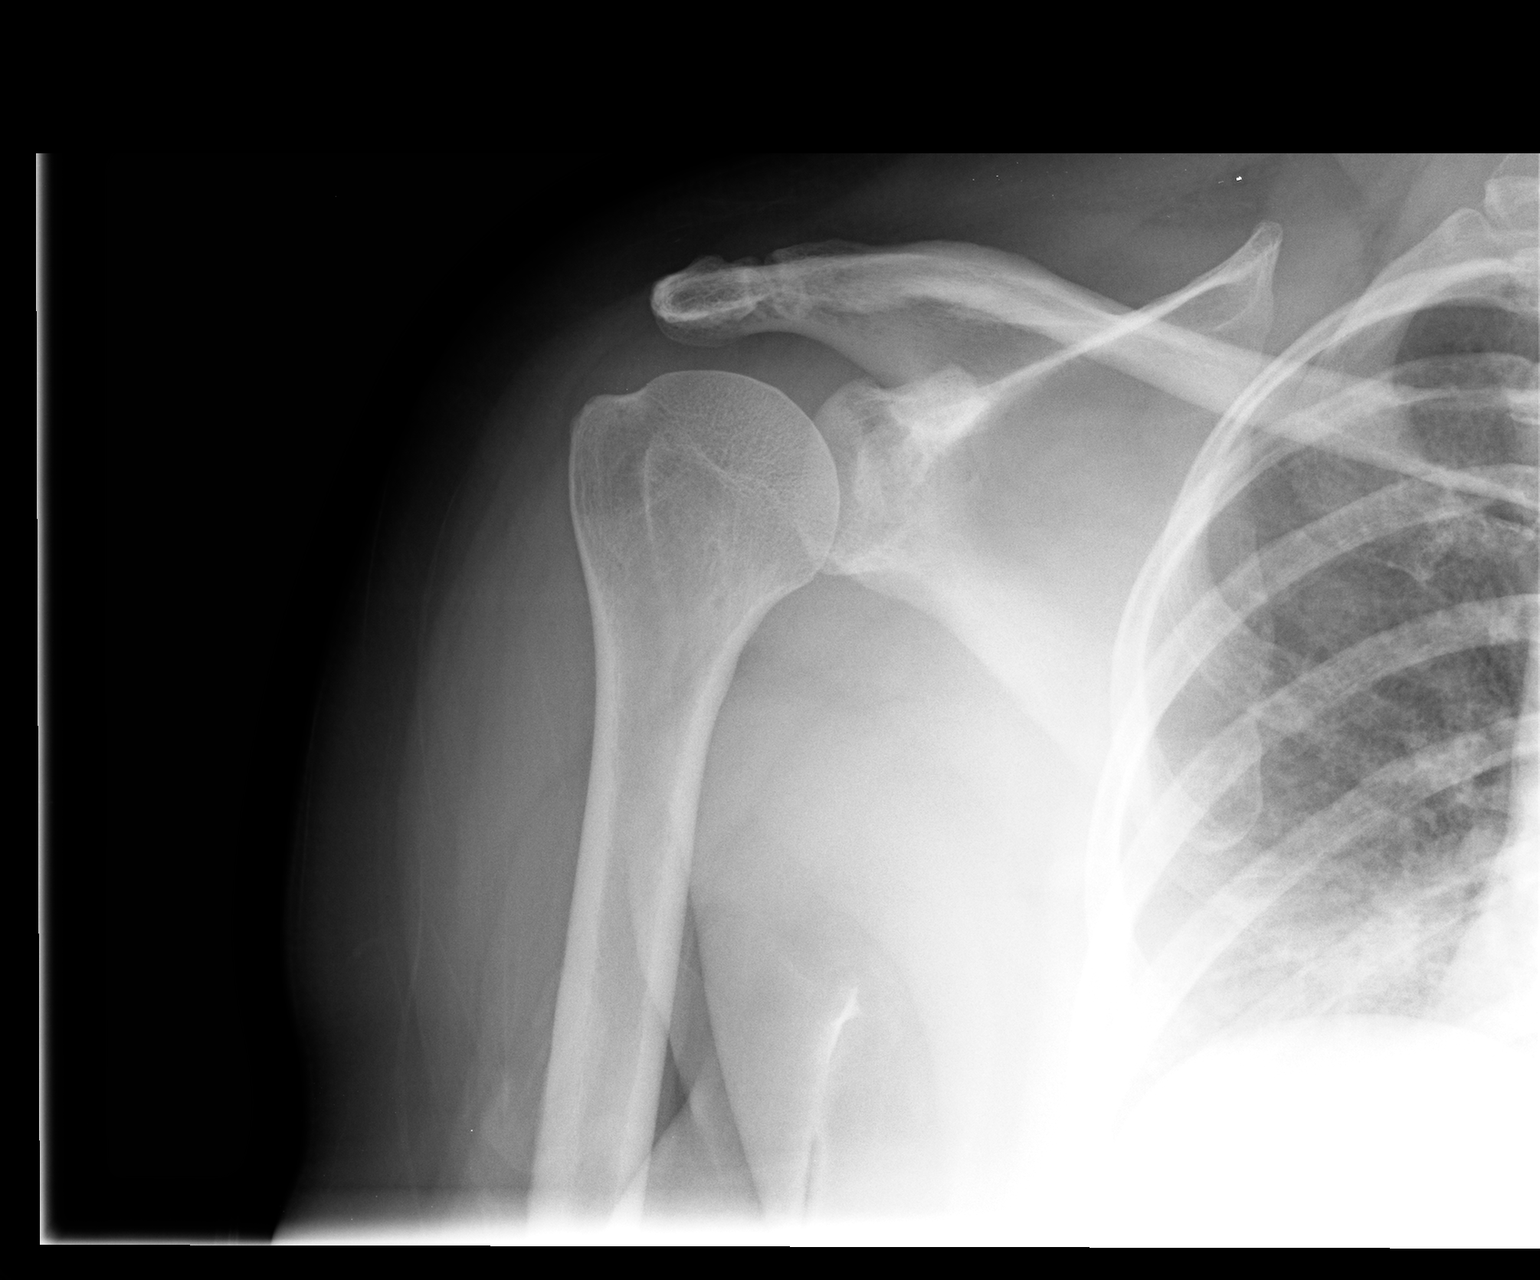

[view not recorded (3 of 3)]
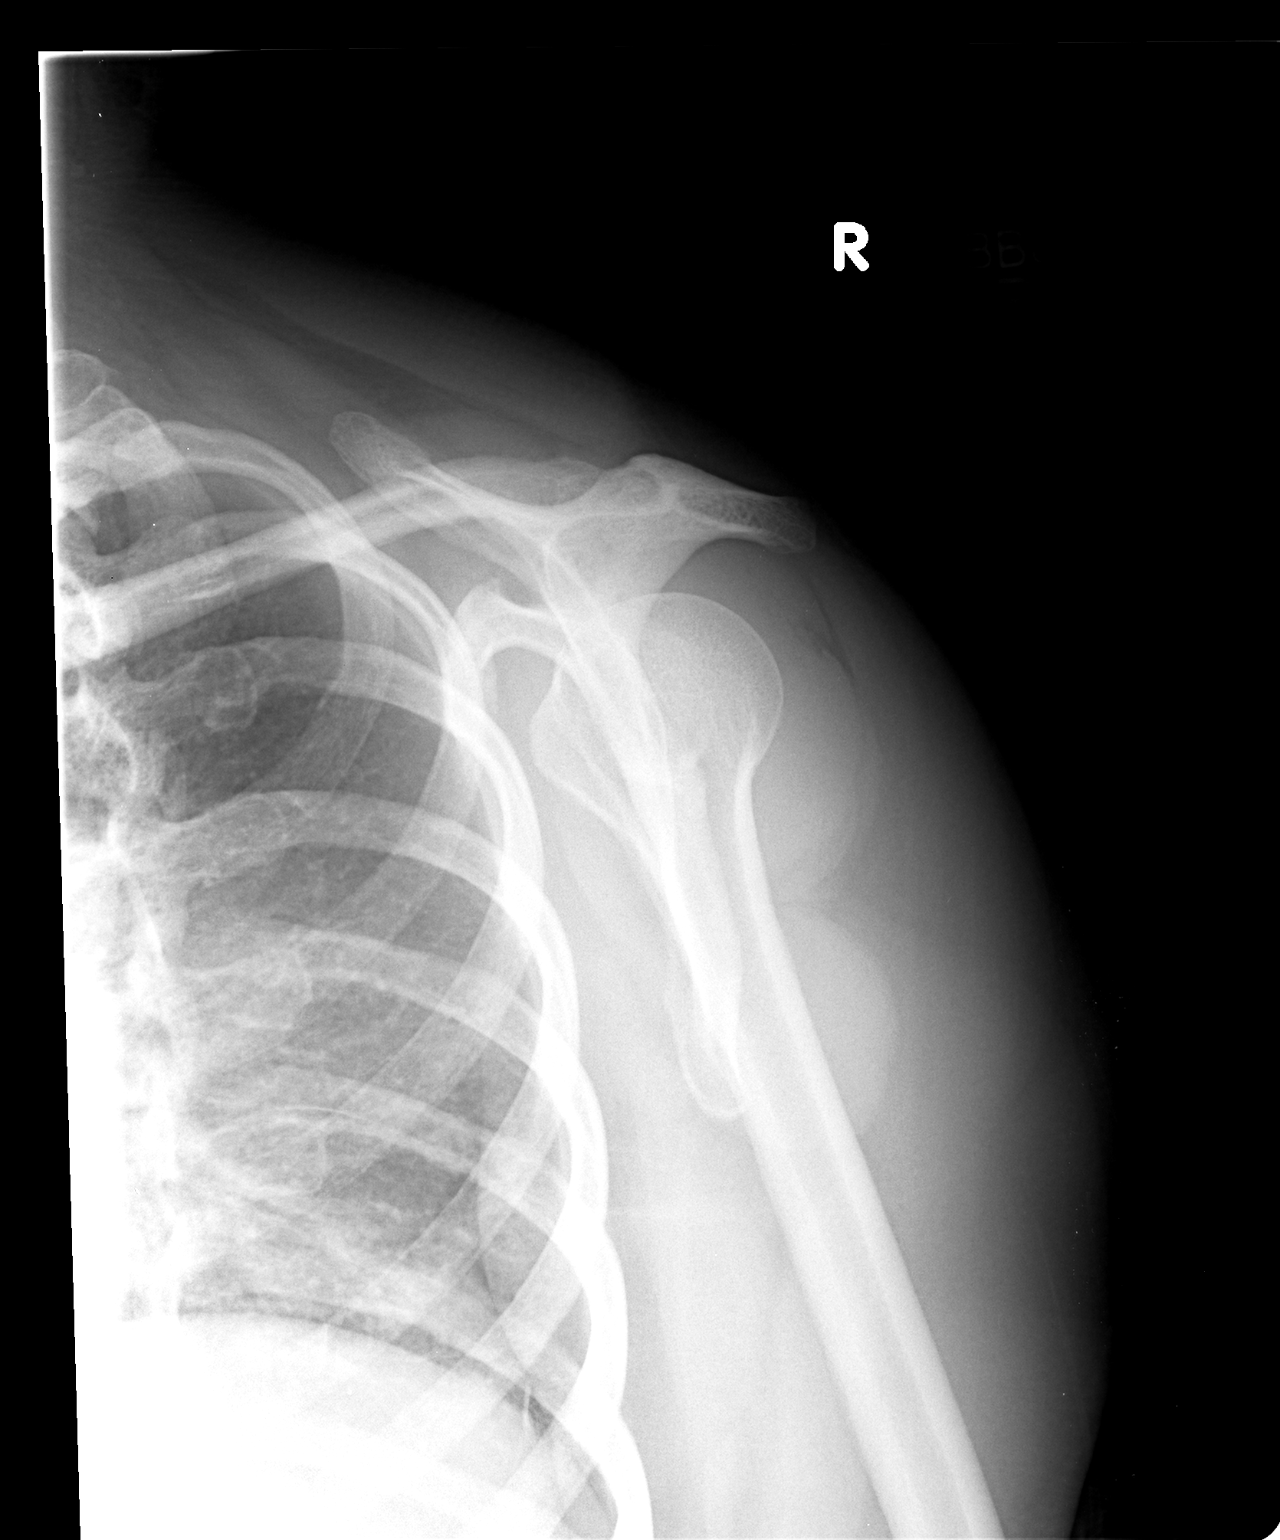

[3 of 3 positions shown; findings below may reference images not displayed]

FINDINGS: AC joint alignment normal.
Osseous mineralization grossly normal.
No acute fracture, dislocation or bone destruction.
Visualized right ribs intact.
IMPRESSION: No acute bony abnormalities.

## 2011-05-10 MED ORDER — HYDROCODONE-ACETAMINOPHEN 5-500 MG PO TABS: 1.0000 | ORAL_TABLET | Freq: Four times a day (QID) | ORAL | Status: AC | PRN

## 2011-05-10 NOTE — ED Provider Notes (Signed)
History     CSN: [id number]  Arrival date & time 05/10/11  0442   First MD Initiated Contact with Patient 05/10/11 [phone]      Chief Complaint  Patient presents with  . Shoulder Pain    (Consider location/radiation/quality/duration/timing/severity/associated sxs/prior treatment) HPI Comments: Injured shoulder several months ago.  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  It began hurting much worse tonight to the point that she couldn't sleep.    Patient is a 56 y.o. female presenting with shoulder pain. The history is provided by the patient.  Shoulder Pain This is a recurrent problem. The problem occurs constantly. The problem has been rapidly worsening. Pertinent negatives include no chest pain. Exacerbated by: movement, palpation. The symptoms are relieved by rest. She has tried nothing for the symptoms.    Past Medical History  Diagnosis Date  . BMI 30.0-30.9,adult 2008 182 LBS    2009 194 LBS  . Helicobacter pylori gastritis AUG 2008 PREVPAC: nausea-->HELIDAC: GI UPSET    HB 14.1 NL HFP [facility]. [facility] STOOL Ag NEG  . Colon polyp APR 2008  . Anxiety   . Essential hypertension, benign   . Hypothyroidism   . Mixed hyperlipidemia   . Irritable bowel syndrome   . Tubulovillous adenoma 08/07/10    Colonoscopy w/ Dr Darrick Penna w/ 7 polyps removed-bx showed tubular adenomas.  Largest 1cm-hepatic flexure polyp->TA.      Past Surgical History  Procedure Date  . Colonoscopy APR 2008    [facility] SIMPLE ADENOMA  . Upper gastrointestinal endoscopy AUG 2008 PAIN/NAUSEA    [facility]  . Cholecystectomy   . Mass excision 02/28/2011    Procedure: EXCISION MASS;  Surgeon: Fabio Bering, MD;  Location: [facility];  Service: General;  Laterality: Right;  soft tissue mass    Family History  Problem Relation Age of Onset  . Colon cancer Neg Hx   . Anesthesia problems Neg Hx   . Hypotension Neg Hx   . Malignant hyperthermia Neg Hx   . Pseudochol deficiency Neg  Hx   . Coronary artery disease Mother   . Coronary artery disease Brother     Age 27    History  Substance Use Topics  . Smoking status: Never Smoker   . Smokeless tobacco: Never Used  . Alcohol Use: No    OB History    Grav Para Term Preterm Abortions TAB SAB Ect Mult Living                  Review of Systems  Cardiovascular: Negative for chest pain.  All other systems reviewed and are negative.    Allergies  Review of patient's allergies indicates no known allergies.  Home Medications   Current Outpatient Rx  Name Route Sig Dispense Refill  . HYDROCHLOROTHIAZIDE 12.5 MG PO CAPS Oral Take 12.5 mg by mouth daily.    Marland Kitchen ALPRAZOLAM 1 MG PO TABS Oral Take 1 mg by mouth 3 (three) times daily as needed. Anxiety    . ENALAPRIL MALEATE 20 MG PO TABS Oral Take 20 mg by mouth daily.      . INDAPAMIDE 1.25 MG PO TABS Oral Take 1.25 mg by mouth every morning.      Marland Kitchen LEVOTHYROXINE SODIUM 50 MCG PO TABS Oral Take 50 mcg by mouth daily.      Marland Kitchen LOVASTATIN 40 MG PO TABS Oral Take 40 mg by mouth at bedtime.      Marland Kitchen  VERAPAMIL HCL ER (CO) 240 MG PO TB24 Oral Take 240 mg by mouth daily.       BP 185/90  Pulse 98  Temp(Src) 97.3 F (36.3 C) (Oral)  Resp 20  Ht 5\' 5"  (1.651 m)  Wt 202 lb (91.627 kg)  BMI 33.61 kg/m2  SpO2 99%  Physical Exam  Nursing note and vitals reviewed. Constitutional: She is oriented to person, place, and time. She appears well-developed and well-nourished.  HENT:  Head: Normocephalic and atraumatic.  Neck: Normal range of motion. Neck supple.  Musculoskeletal:       The right shoulder appears grossly normal.  There is ttp over the posterior aspect.  There is pain with range of motion.  Neurovascularly, the arm is intact.  Neurological: She is alert and oriented to person, place, and time.  Skin: Skin is warm and dry.    ED Course  Procedures (including critical care time)  Labs Reviewed - No data to display No results found.   No diagnosis  found.    MDM  The xray does not show any abnormality.  Her history and exam seem like this is likely a rotator cuff tendinitis.  Will recommend nsaids, prescribe pain meds.  To see pcp if not improving in the next week.          Geoffery Lyons, MD 05/10/11 [phone]

## 2011-05-10 NOTE — Discharge Instructions (Signed)
Rotator Cuff Tendonitis  The rotator cuff is the collection of all the muscles and tendons (the supraspinatus, infraspinatus, subscapularis, and teres minor muscles and their tendons) that help your shoulder stay in place. This unit holds the head of the upper arm bone (humerus) in the cup (fossa) of the shoulder blade (scapula). Basically, it connects the arm to the shoulder. Tendinitis is a swelling and irritation of the tissue, called cord like structures (tendons) that connect muscle to bone. It usually is caused by overusing the joint involved. When the tissue surrounding a tendon (the synovium) becomes inflamed, it is called tenosynovitis. This also is often the result of overuse in people whose jobs require repetitive (over and over again) types of motion. HOME CARE INSTRUCTIONS   Use a sling or splint for as long as directed by your caregiver until the pain decreases.   Apply ice to the injury for 15 to 20 minutes, 3 to 4 times per day. Put the ice in a plastic bag and place a towel between the bag of ice and your skin.   Try to avoid use other than gentle range of motion while your shoulder is painful. Use and exercise only as directed by your caregiver. Stop exercises or range of motion if pain or discomfort increases, unless directed otherwise by your caregiver.   Only take over-the-counter or prescription medicines for pain, discomfort, or fever as directed by your caregiver.   If you were give a shoulder sling and straps (immobilizer), do not remove it except as directed, or until you see a caregiver for a follow-up examination. If you need to remove it, move your arm as little as possible or as directed.   You may want to sleep on several pillows at night to lessen swelling and pain.  SEEK IMMEDIATE MEDICAL CARE IF:   Pain in your shoulder increases or new pain develops in your arm, hand, or fingers and is not relieved with medications.   You develop new, unexplained symptoms,  especially increased numbness in the hands or loss of strength, or you develop any worsening of the problems which brought you in for care.   Your arm, hand, or fingers are numb or tingling.   Your arm, hand, or fingers are swollen, painful, or turn white or blue.  Document Released: 05/12/2003 Document Revised: 02/08/2011 Document Reviewed: 12/18/2007 [facility] Patient Information 2012 [place], [place].      Take ibuprofen 600 mg three times daily for the next 5 days.  Hydrocodone as needed for pain not relieved with ibuprofen.    If not improving in the next week, you need to see your primary doctor to discuss further testing.

## 2011-05-10 NOTE — ED Notes (Addendum)
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 Denies n/v; denies SOB; denies chest pain; denies diaphoresis.

## 2011-05-26 ENCOUNTER — Emergency Department (HOSPITAL_COMMUNITY)
Admission: EM | Admit: 2011-05-26 | Discharge: 2011-05-26 | Disposition: A | Discharge status: Home / Self Care | Payer: BC Managed Care – PPO | Attending: Emergency Medicine | Admitting: Emergency Medicine

## 2011-05-26 ENCOUNTER — Encounter (HOSPITAL_COMMUNITY): Payer: Self-pay | Admitting: Emergency Medicine

## 2011-05-26 ENCOUNTER — Emergency Department (HOSPITAL_COMMUNITY): Payer: BC Managed Care – PPO

## 2011-05-26 DIAGNOSIS — S7000XA Contusion of unspecified hip, initial encounter: Secondary | ICD-10-CM | POA: Insufficient documentation

## 2011-05-26 DIAGNOSIS — M25559 Pain in unspecified hip: Secondary | ICD-10-CM | POA: Insufficient documentation

## 2011-05-26 DIAGNOSIS — S20219A Contusion of unspecified front wall of thorax, initial encounter: Secondary | ICD-10-CM | POA: Insufficient documentation

## 2011-05-26 DIAGNOSIS — E782 Mixed hyperlipidemia: Secondary | ICD-10-CM | POA: Insufficient documentation

## 2011-05-26 DIAGNOSIS — W19XXXA Unspecified fall, initial encounter: Secondary | ICD-10-CM | POA: Insufficient documentation

## 2011-05-26 DIAGNOSIS — I10 Essential (primary) hypertension: Secondary | ICD-10-CM | POA: Insufficient documentation

## 2011-05-26 DIAGNOSIS — R079 Chest pain, unspecified: Secondary | ICD-10-CM | POA: Insufficient documentation

## 2011-05-26 DIAGNOSIS — E039 Hypothyroidism, unspecified: Secondary | ICD-10-CM | POA: Insufficient documentation

## 2011-05-26 DIAGNOSIS — K589 Irritable bowel syndrome without diarrhea: Secondary | ICD-10-CM | POA: Insufficient documentation

## 2011-05-26 IMAGING — CR DG RIBS W/ CHEST 3+V*L*
4 series · 4 of 4 positions shown · non-contrast
Comparison: No prior rib x-rays.  Two-view chest x-ray [DATE],
[DATE], [DATE] [HOSPITAL].

CLINICAL DATA: Fell.  Left posterior rib pain.

LEFT RIBS AND CHEST - 3+ VIEW [DATE]:

[view not recorded (1 of 4)]
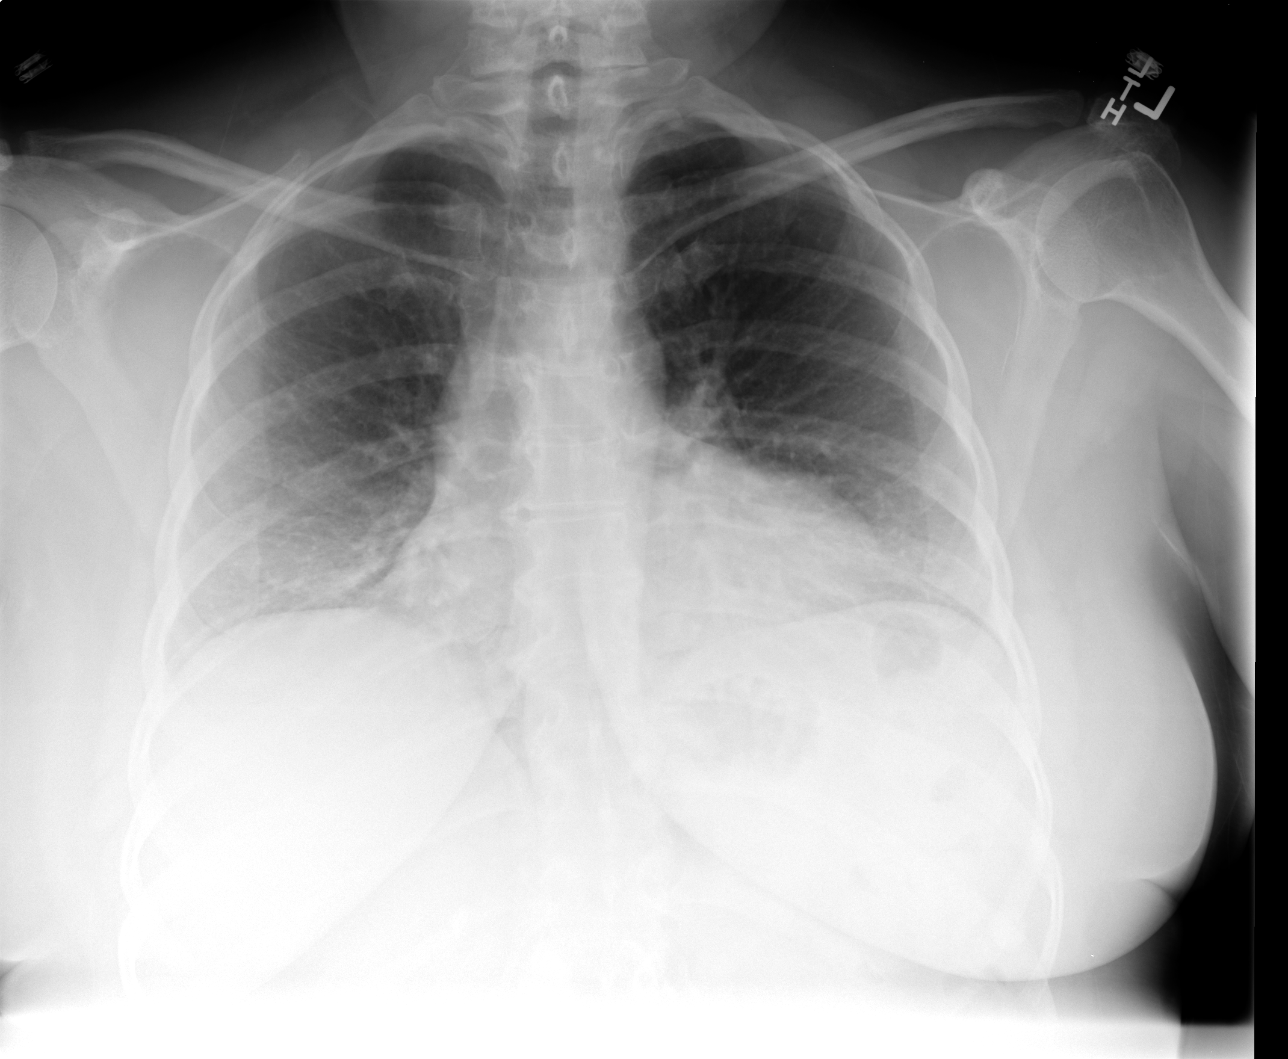

[view not recorded (2 of 4)]
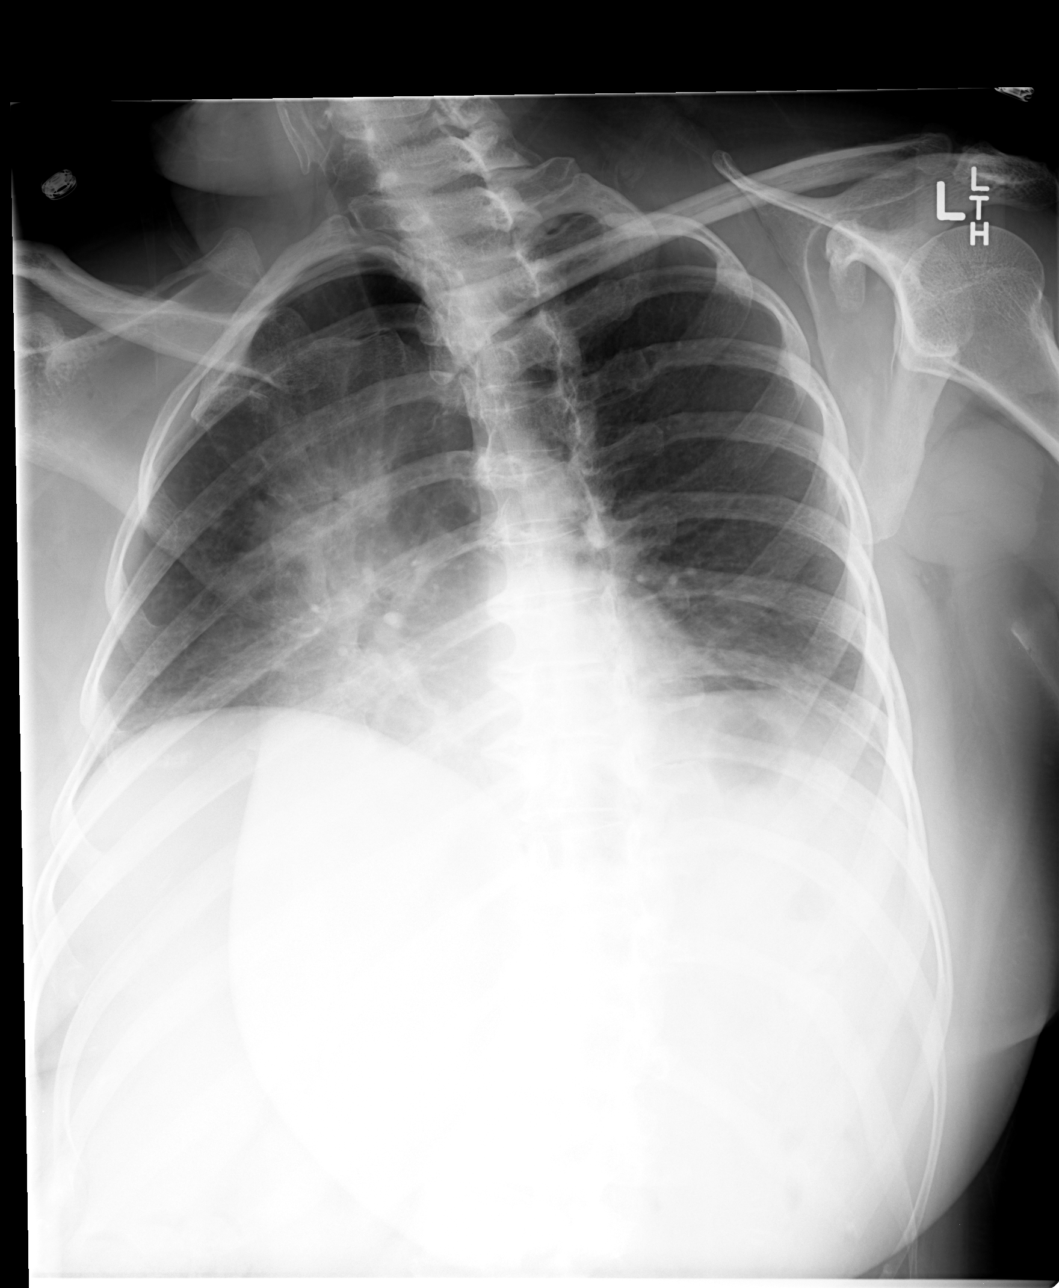

[view not recorded (3 of 4)]
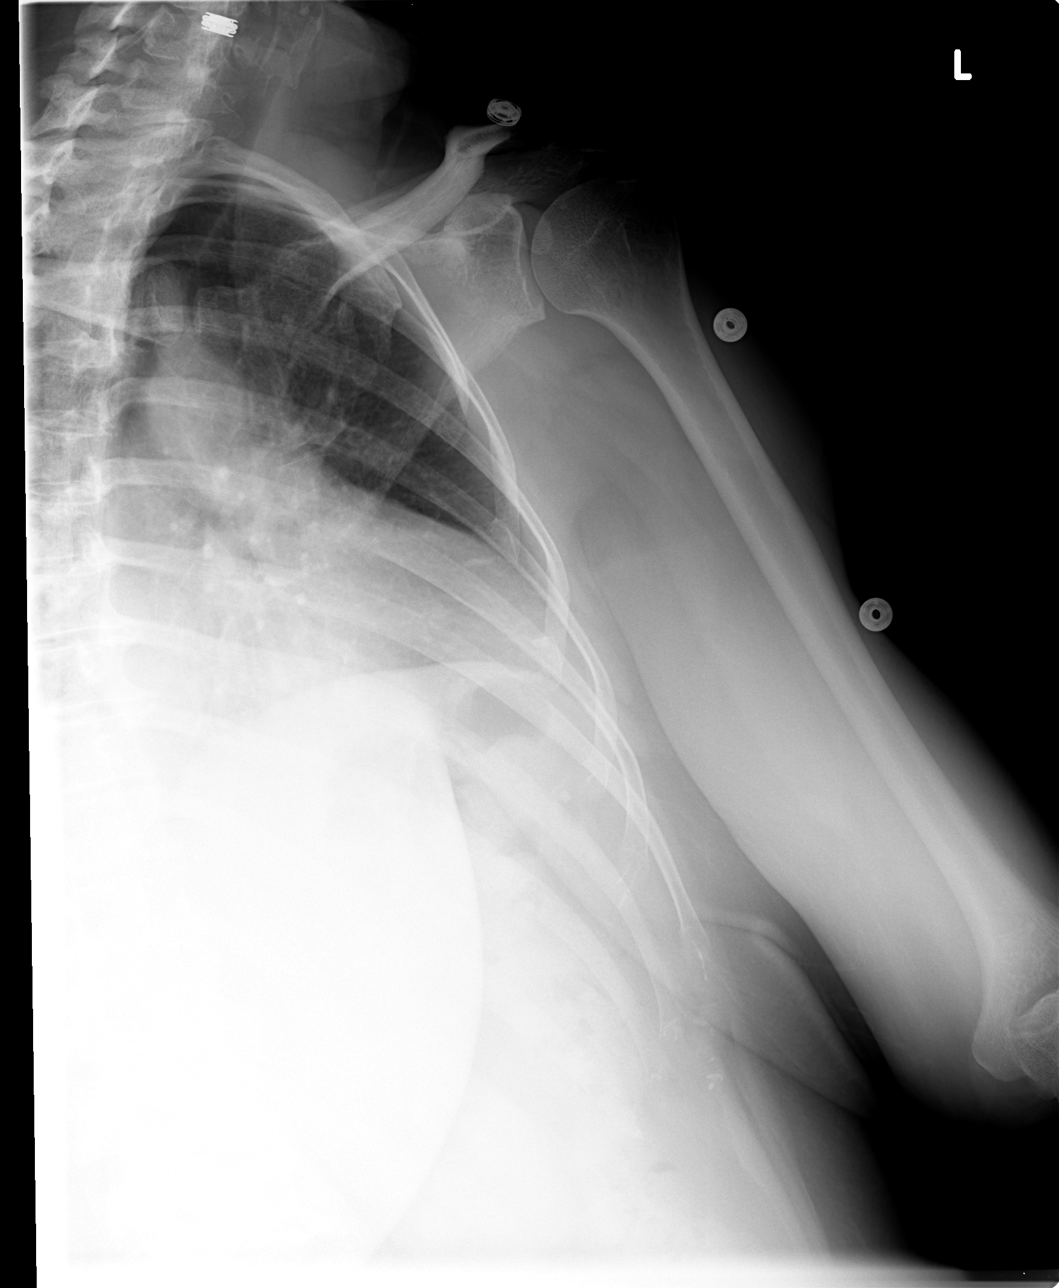

[view not recorded (4 of 4)]
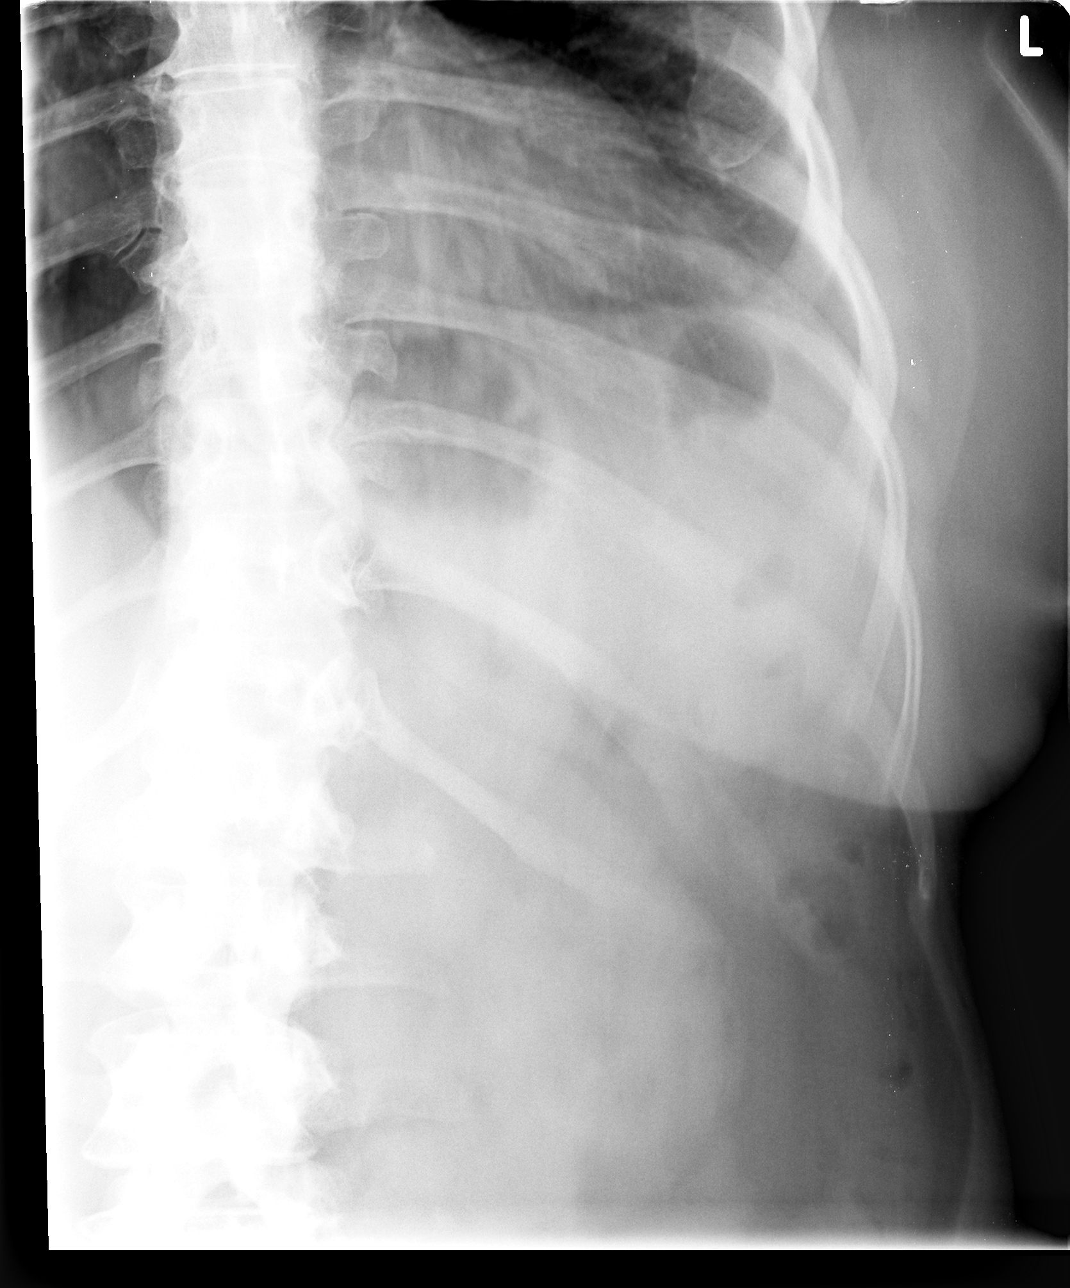

[4 of 4 positions shown; findings below may reference images not displayed]

FINDINGS: No left rib fractures identified.  No intrinsic osseous
abnormality involving the left ribs.

Suboptimal inspiration due to body habitus which accounts for
crowded bronchovascular markings at the bases and accentuates the
cardiac silhouette.  Taking this into account, cardiac silhouette
mildly enlarged but stable and lungs clear.
IMPRESSION: 1.  No left rib fractures identified.
2.  Suboptimal inspiration.  Stable mild cardiomegaly.  No acute
cardiopulmonary disease.

## 2011-05-26 IMAGING — CR DG HIP COMPLETE 2+V*R*
3 series · 3 of 3 positions shown · non-contrast
Comparison: None.

CLINICAL DATA: Left hip pain after a fall.

RIGHT HIP - COMPLETE 2+ VIEW [DATE]:

[view not recorded (1 of 3)]
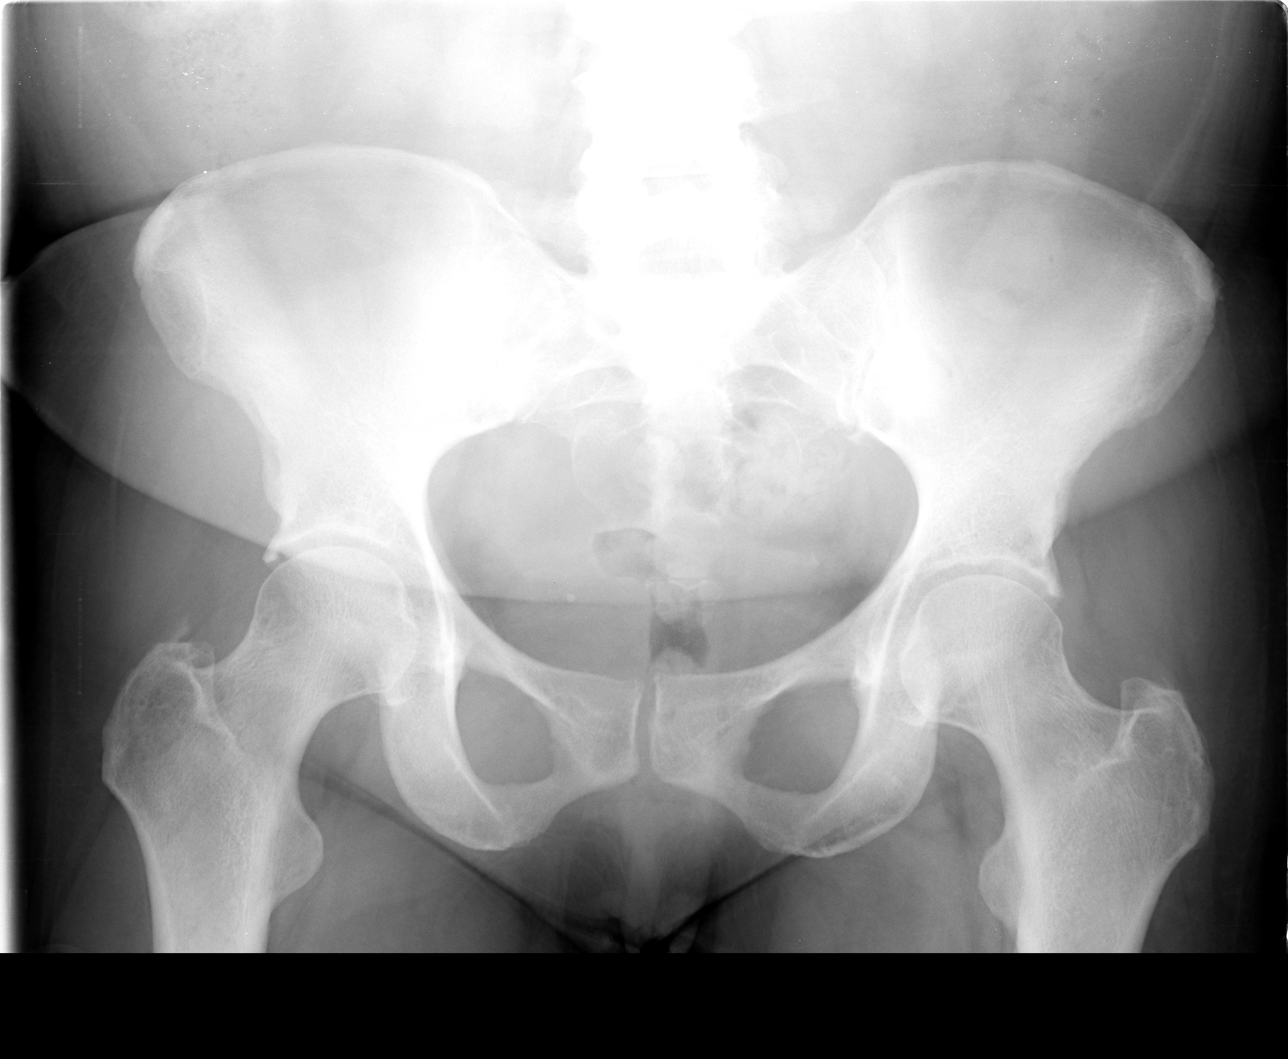

[view not recorded (2 of 3)]
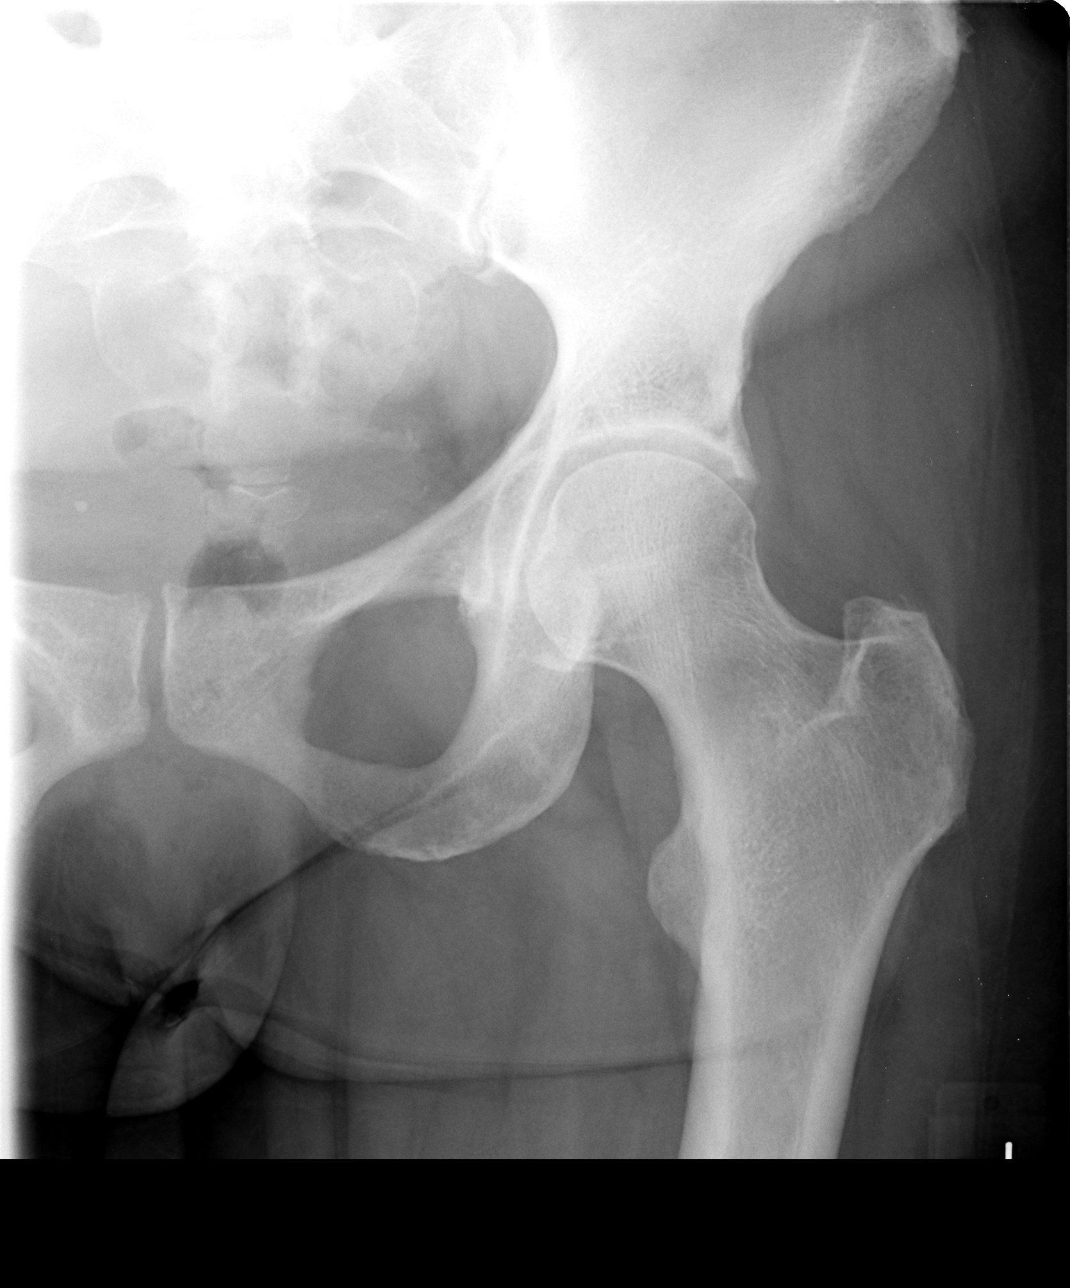

[view not recorded (3 of 3)]
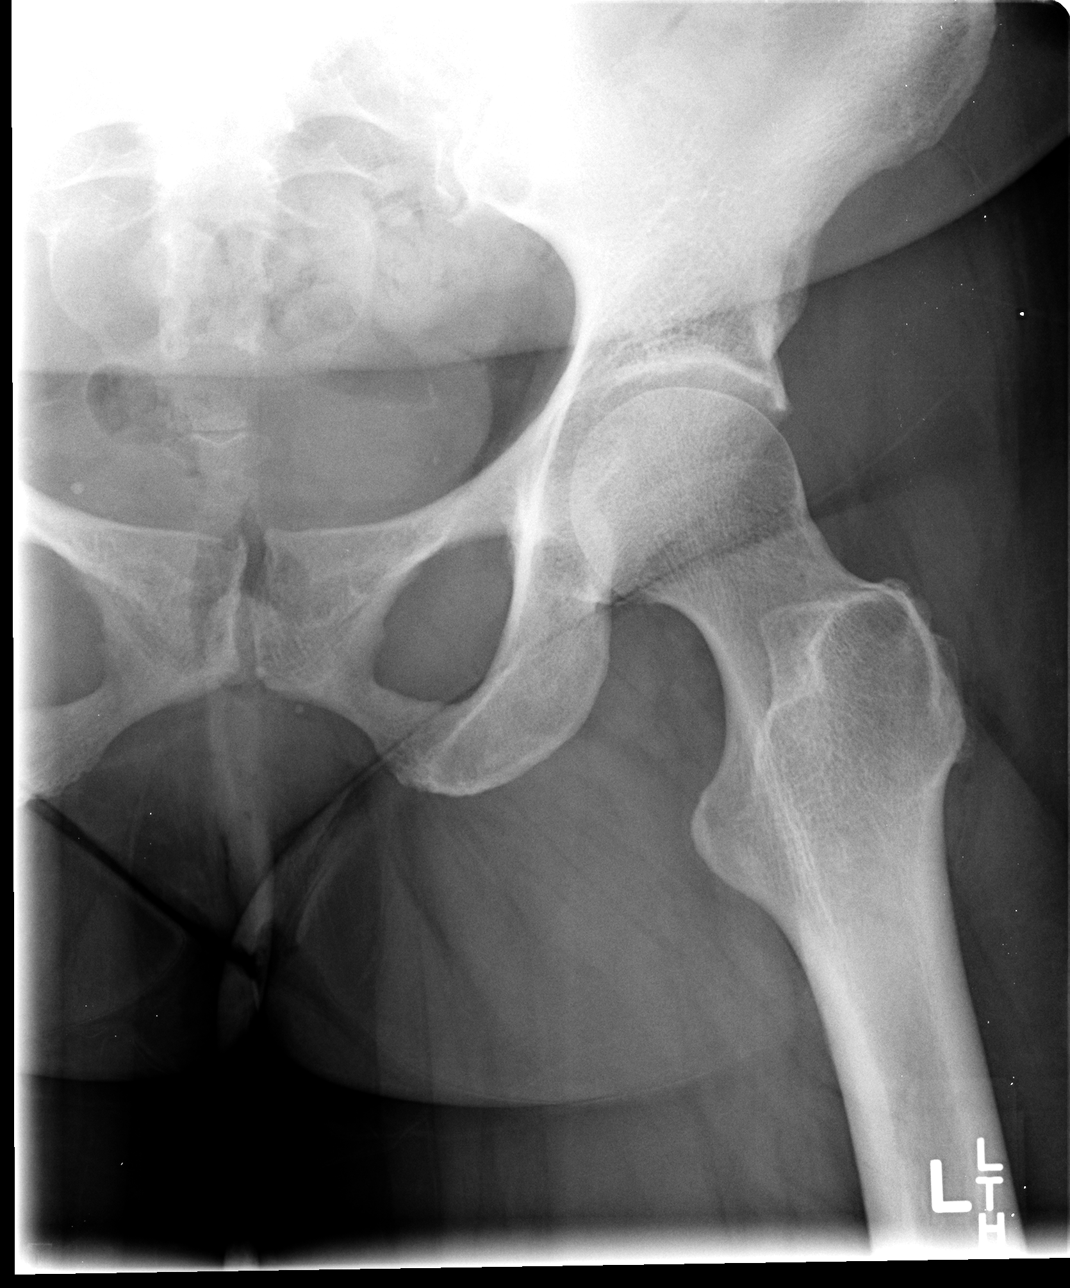

[3 of 3 positions shown; findings below may reference images not displayed]

FINDINGS: No evidence of acute or subacute fracture or dislocation.
Joint space well-preserved.  No intrinsic osseous abnormalities.
No visible joint effusion.

Included AP pelvis demonstrates a normal appearing contralateral
right hip.  There is calcification at the insertion of the right
gluteal tendon on the right greater trochanter.  Sacroiliac joints
and symphysis pubis intact.  No fractures elsewhere involving the
bony pelvis.  Visualized lower lumbar spine unremarkable.
IMPRESSION: Normal left hip.  Calcification at the insertion of the right
gluteal tendon on the greater trochanter of the right femur,
consistent with calcific gluteal tendonitis.

## 2011-05-26 NOTE — Discharge Instructions (Signed)

## 2011-05-26 NOTE — ED Notes (Signed)
Pt fell going up the step yesterday and now c/o left hip pain. Pt is ambulatory.

## 2011-05-29 NOTE — ED Provider Notes (Signed)
History     CSN: [id number]  Arrival date & time 05/26/11  1005   First MD Initiated Contact with Patient 05/26/11 1021      Chief Complaint  Patient presents with  . Fall  . Hip Pain    (Consider location/radiation/quality/duration/timing/severity/associated sxs/prior treatment) Patient is a 56 y.o. female presenting with fall and hip pain. The history is provided by the patient. No language interpreter was used.  Fall The accident occurred yesterday. The fall occurred while standing. She fell from a height of 3 to 5 ft. She landed on carpet. There was no blood loss. Point of impact: right hip. Pain location: pelvis, left lateral ribs. The pain is mild. She was ambulatory at the scene. There was no entrapment after the fall. There was no drug use involved in the accident. There was no alcohol use involved in the accident. Pertinent negatives include no fever, no numbness, no abdominal pain, no bowel incontinence, no nausea, no vomiting, no hematuria, no headaches, no hearing loss, no loss of consciousness and no tingling. The symptoms are aggravated by activity and pressure on the injury. She has tried nothing for the symptoms. The treatment provided no relief.  Hip Pain Associated symptoms include arthralgias. Pertinent negatives include no abdominal pain, fever, headaches, joint swelling, nausea, numbness or vomiting.    Past Medical History  Diagnosis Date  . BMI 30.0-30.9,adult 2008 182 LBS    2009 194 LBS  . Helicobacter pylori gastritis AUG 2008 PREVPAC: nausea-->HELIDAC: GI UPSET    HB 14.1 NL HFP H. PYLORI STOOL Ag NEG  . Colon polyp APR 2008  . Anxiety   . Essential hypertension, benign   . Hypothyroidism   . Mixed hyperlipidemia   . Irritable bowel syndrome   . Tubulovillous adenoma 08/07/10    Colonoscopy w/ Dr Darrick Penna w/ 7 polyps removed-bx showed tubular adenomas.  Largest 1cm-hepatic flexure polyp->TA.      Past Surgical History  Procedure Date  . Colonoscopy APR  2008    AC/[facility] SIMPLE ADENOMA  . Upper gastrointestinal endoscopy AUG 2008 PAIN/NAUSEA    H. PYLORI  . Cholecystectomy   . Mass excision 02/28/2011    Procedure: EXCISION MASS;  Surgeon: Fabio Bering, MD;  Location: [facility];  Service: General;  Laterality: Right;  soft tissue mass    Family History  Problem Relation Age of Onset  . Colon cancer Neg Hx   . Anesthesia problems Neg Hx   . Hypotension Neg Hx   . Malignant hyperthermia Neg Hx   . Pseudochol deficiency Neg Hx   . Coronary artery disease Mother   . Coronary artery disease Brother     Age 68    History  Substance Use Topics  . Smoking status: Never Smoker   . Smokeless tobacco: Never Used  . Alcohol Use: No    OB History    Grav Para Term Preterm Abortions TAB SAB Ect Mult Living                  Review of Systems  Constitutional: Negative for fever.  Gastrointestinal: Negative for nausea, vomiting, abdominal pain, blood in stool, abdominal distention and bowel incontinence.  Genitourinary: Negative for dysuria, hematuria, flank pain, decreased urine volume and difficulty urinating.  Musculoskeletal: Positive for arthralgias. Negative for back pain, joint swelling and gait problem.  Skin: Negative.   Neurological: Negative for dizziness, tingling, loss of consciousness, numbness and headaches.  Hematological: Negative for adenopathy. Does not bruise/bleed easily.  Psychiatric/Behavioral:  Negative for confusion and decreased concentration.  All other systems reviewed and are negative.    Allergies  Review of patient's allergies indicates no known allergies.  Home Medications   Current Outpatient Rx  Name Route Sig Dispense Refill  . ALPRAZOLAM 1 MG PO TABS Oral Take 1 mg by mouth 3 (three) times daily as needed. Anxiety    . ENALAPRIL MALEATE 20 MG PO TABS Oral Take 20 mg by mouth daily.      Marland Kitchen HYDROCHLOROTHIAZIDE 12.5 MG PO CAPS Oral Take 12.5 mg by mouth daily.    . INDAPAMIDE 1.25 MG PO TABS Oral  Take 1.25 mg by mouth every morning.      Marland Kitchen LEVOTHYROXINE SODIUM 50 MCG PO TABS Oral Take 50 mcg by mouth daily.      Marland Kitchen LOVASTATIN 40 MG PO TABS Oral Take 40 mg by mouth at bedtime.      Marland Kitchen VERAPAMIL HCL ER (CO) 240 MG PO TB24 Oral Take 240 mg by mouth daily.       BP 160/86  Pulse 104  Temp(Src) 98.1 F (36.7 C) (Oral)  Resp 17  Ht 5\' 5"  (1.651 m)  Wt 202 lb (91.627 kg)  BMI 33.61 kg/m2  SpO2 100%  Physical Exam  Nursing note and vitals reviewed. Constitutional: She is oriented to person, place, and time. She appears well-developed and well-nourished. No distress.  HENT:  Head: Normocephalic and atraumatic.  Mouth/Throat: Oropharynx is clear and moist.  Neck: Normal range of motion. Neck supple.  Cardiovascular: Normal rate, regular rhythm, normal heart sounds and intact distal pulses.   No murmur heard. Pulmonary/Chest: Effort normal and breath sounds normal. No respiratory distress. She has no wheezes. She has no rales. She exhibits tenderness.  Abdominal: Soft. She exhibits no distension. There is no tenderness.  Musculoskeletal: Normal range of motion. She exhibits tenderness. She exhibits no edema.       Right hip: She exhibits tenderness. She exhibits normal range of motion, normal strength, no bony tenderness, no swelling, no crepitus, no deformity and no laceration.       Legs: Lymphadenopathy:    She has no cervical adenopathy.  Neurological: She is alert and oriented to person, place, and time. No cranial nerve deficit or sensory deficit. She exhibits normal muscle tone. Coordination and gait normal.  Reflex Scores:      Patellar reflexes are 2+ on the right side and 2+ on the left side.      Achilles reflexes are 2+ on the right side and 2+ on the left side. Skin: Skin is warm and dry.    ED Course  Procedures (including critical care time)  Dg Ribs Unilateral W/chest Left  05/26/2011  *RADIOLOGY REPORT*  Clinical Data: Larey Seat.  Left posterior rib pain.  LEFT RIBS  AND CHEST - 3+ VIEW 05/26/2011:  Comparison: No prior rib x-rays.  Two-view chest x-ray 03/06/2010, 08/10/2009, 06/13/2009 [facility].  Findings: No left rib fractures identified.  No intrinsic osseous abnormality involving the left ribs.  Suboptimal inspiration due to body habitus which accounts for crowded bronchovascular markings at the bases and accentuates the cardiac silhouette.  Taking this into account, cardiac silhouette mildly enlarged but stable and lungs clear.  IMPRESSION:  1.  No left rib fractures identified. 2.  Suboptimal inspiration.  Stable mild cardiomegaly.  No acute cardiopulmonary disease.  Original Report Authenticated By: Arnell Sieving, M.D.    Dg Hip Complete Right  05/26/2011  *RADIOLOGY REPORT*  Clinical Data:  Left hip pain after a fall.  RIGHT HIP - COMPLETE 2+ VIEW 05/26/2011:  Comparison: None.  Findings: No evidence of acute or subacute fracture or dislocation. Joint space well-preserved.  No intrinsic osseous abnormalities. No visible joint effusion.  Included AP pelvis demonstrates a normal appearing contralateral right hip.  There is calcification at the insertion of the right gluteal tendon on the right greater trochanter.  Sacroiliac joints and symphysis pubis intact.  No fractures elsewhere involving the bony pelvis.  Visualized lower lumbar spine unremarkable.  IMPRESSION: Normal left hip.  Calcification at the insertion of the right gluteal tendon on the greater trochanter of the right femur, consistent with calcific gluteal tendonitis.  Original Report Authenticated By: Arnell Sieving, M.D.    1. Contusion, hip   2. Contusion, chest wall       MDM     Patient with hx of fall but ambulates with a steady gait.  No focal neuro deficits on exam.  No fx's on x-ray.  States she has pain medication at home.  Agrees to f/u with her PMD or to return to ED if her symptoms worsen or not improving.     Patient / Family / Caregiver understand and  agree with initial ED impression and plan with expectations set for ED visit. Pt stable in ED with no significant deterioration in condition. Pt feels improved after observation and/or treatment in ED.          Cherri Yera L. [place], [place] 05/29/11 2144

## 2011-05-30 NOTE — ED Provider Notes (Signed)
Medical screening examination/treatment/procedure(s) were performed by non-physician practitioner and as supervising physician I was immediately available for consultation/collaboration.   Joya Gaskins, MD 05/30/11 [id number]

## 2011-07-02 ENCOUNTER — Other Ambulatory Visit (HOSPITAL_COMMUNITY)
Admission: RE | Admit: 2011-07-02 | Discharge: 2011-07-02 | Disposition: A | Discharge status: Home / Self Care | Payer: BC Managed Care – PPO | Source: Ambulatory Visit | Attending: Obstetrics & Gynecology | Admitting: Obstetrics & Gynecology

## 2011-07-02 ENCOUNTER — Other Ambulatory Visit: Payer: Self-pay | Admitting: Obstetrics & Gynecology

## 2011-07-02 DIAGNOSIS — Z01419 Encounter for gynecological examination (general) (routine) without abnormal findings: Secondary | ICD-10-CM | POA: Insufficient documentation

## 2011-07-06 ENCOUNTER — Encounter: Payer: Self-pay | Admitting: Gastroenterology

## 2011-07-31 ENCOUNTER — Telehealth: Payer: Self-pay | Admitting: Gastroenterology

## 2011-07-31 NOTE — Telephone Encounter (Signed)
Pt was in [facility] 08/2010 and was seen by RMR. Pt is upset that SF never followed up or called to check on her and decided she wanted to switch to RMR. Pt has been scheduled to come in office to see SF on 6/13 at 3 pm. I was told by DS that I needed to change OV with RMR since patient switched. I called patient and she said she would see SF since she did her procedure, but wanted to talk face to face with SF to let her know how she feels and then will decide if she wants to follow with RMR. Please advise if this will be SF or RMR patient. Patient can be reached at [phone]

## 2011-08-01 NOTE — Telephone Encounter (Addendum)
PT TRANSFERRED HER CARE TO DR. Jena Gauss June 2012. SHE SHOULD REMAIN AS HIS PT. CANCEL OPV W/ SLF June 13. CALLED PT AT [phone] TO DISCUSS-NO ANSWER. NO ANSWERING MACHINE. WILL SPEAK WITH PT VIA PHONE.

## 2011-08-02 ENCOUNTER — Encounter: Payer: Self-pay | Admitting: Internal Medicine

## 2011-08-02 NOTE — Telephone Encounter (Signed)
I tried calling patient and didn't get an answer or voicemail. I have cancelled OV with SF and [facility] with RMR on 6/25 @330  and will mail letter with appt card today. Per SF she is to remain a RMR patient and she will speak to patient via phone.

## 2011-08-03 ENCOUNTER — Encounter: Payer: Self-pay | Admitting: Gastroenterology

## 2011-08-16 ENCOUNTER — Ambulatory Visit: Payer: BC Managed Care – PPO | Admitting: Gastroenterology

## 2011-08-28 ENCOUNTER — Encounter: Payer: Self-pay | Admitting: Internal Medicine

## 2011-08-28 ENCOUNTER — Ambulatory Visit (INDEPENDENT_AMBULATORY_CARE_PROVIDER_SITE_OTHER): Payer: BC Managed Care – PPO | Admitting: Internal Medicine

## 2011-08-28 VITALS — BP 138/76 | HR 80 | Temp 97.8°F | Ht 65.0 in | Wt 199.8 lb

## 2011-08-28 DIAGNOSIS — K5900 Constipation, unspecified: Secondary | ICD-10-CM

## 2011-08-28 DIAGNOSIS — R109 Unspecified abdominal pain: Secondary | ICD-10-CM

## 2011-08-28 NOTE — Patient Instructions (Addendum)
Take Benefiber 2 tablespoons every day  Align probiotic one capsule daily-samples provided  Take MiraLax 17 g at bedtime nightly on any given day without a good bowel movement  Office visit with [name] in one month  Stool sample to be returned to check for occult blood

## 2011-08-28 NOTE — Progress Notes (Signed)
Primary Care Physician:  Harlow Asa, MD Primary Gastroenterologist:  Dr. Jena Gauss  Pre-Procedure History & Physical: HPI:  Kaitlin [name] is a 56 y.o. female here for followup of abdominal pain. Colonoscopy with multiple polypectomies done  1 year ago by Dr. Darrick Penna. Most advanced lesion removal -  tubulovillous adenoma. Post procedure course complicated by abdominal pain. 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 A CT demonstrated uterine fibroid. Saw a gynecologist. Biopsies negative for malignancy; the patient continues to have the same pain she had which started after colonoscopy one year ago. This is setting of chronic constipation. Has any melena or rectal bleeding. She is due for surveillance colonoscopy 2 years from now. Has not lost any weight. Not had fever chills. Rare reflux symptoms no odynophagia no dysphagia no early satiety. Distant history of H. pylori gastritis treated.  She states she saw Dr. Gerda Diss recently who gave her a "pill" for her abdominal pain. She did not take it and decided to come here. Patient has a fear of her pain being caused by an occult cancer  Past Medical History  Diagnosis Date  . BMI 30.0-30.9,adult 2008 182 LBS    2009 194 LBS  . Helicobacter pylori gastritis AUG 2008 PREVPAC: nausea-->HELIDAC: GI UPSET    HB 14.1 NL HFP H. PYLORI STOOL Ag NEG  . Colon polyp APR 2008  . Anxiety   . Essential hypertension, benign   . Hypothyroidism   . Mixed hyperlipidemia   . Irritable bowel syndrome   . Tubulovillous adenoma 08/07/10    Colonoscopy w/ Dr Darrick Penna w/ 7 polyps removed-bx showed tubular adenomas.  Largest 1cm-hepatic flexure polyp->TA.      Past Surgical History  Procedure Date  . Colonoscopy APR 2008    AC/[facility] SIMPLE ADENOMA  . Upper gastrointestinal endoscopy AUG 2008 PAIN/NAUSEA    H. PYLORI  . Cholecystectomy   . Mass excision 02/28/2011    Procedure: EXCISION MASS;  Surgeon: Fabio Bering, MD;   Location: [facility];  Service: General;  Laterality: Right;  soft tissue mass  . Colonoscopy 08/07/2010    Dr. Lorane Gell adenomas    Prior to Admission medications   Medication Sig Start Date End Date Taking? Authorizing Provider  ALPRAZolam Prudy Feeler) 1 MG tablet Take 1 mg by mouth 3 (three) times daily as needed. Anxiety   Yes Historical Provider, MD  enalapril (VASOTEC) 20 MG tablet Take 20 mg by mouth daily.     Yes Historical Provider, MD  hydrochlorothiazide (MICROZIDE) 12.5 MG capsule Take 12.5 mg by mouth daily.   Yes Historical Provider, MD  indapamide (LOZOL) 1.25 MG tablet Take 1.25 mg by mouth every morning.     Yes Historical Provider, MD  levothyroxine (SYNTHROID, LEVOTHROID) 50 MCG tablet Take 50 mcg by mouth daily.     Yes Historical Provider, MD  lovastatin (MEVACOR) 40 MG tablet Take 40 mg by mouth at bedtime.     Yes Historical Provider, MD  verapamil (COVERA HS) 240 MG (CO) 24 hr tablet Take 240 mg by mouth daily.    Yes Historical Provider, MD    Allergies as of 08/28/2011  . (No Known Allergies)    Family History  Problem Relation Age of Onset  . Colon cancer Neg Hx   . Anesthesia problems Neg Hx   . Hypotension Neg Hx   . Malignant hyperthermia Neg Hx   . Pseudochol deficiency Neg Hx   . Coronary artery disease Mother   .  Coronary artery disease Brother     Age 76    History   Social History  . Marital Status: Married    Spouse Name: N/A    Number of Children: N/A  . Years of Education: N/A   Occupational History  . Not on file.   Social History Main Topics  . Smoking status: Never Smoker   . Smokeless tobacco: Never Used  . Alcohol Use: No  . Drug Use: No  . Sexually Active: Not on file   Other Topics Concern  . Not on file   Social History Narrative  . No narrative on file    Review of Systems: See HPI, otherwise negative ROS  Physical Exam: BP 138/76  Pulse 80  Temp 97.8 F (36.6 C) (Tympanic)  Ht 5\' 5"  (1.651 m)  Wt 199 lb 12.8  oz (90.629 kg)  BMI 33.25 kg/m2 General:   Alert,  Well-developed, well-nourished, pleasant and cooperative in NAD Skin:  Intact without significant lesions or rashes. Eyes:  Sclera clear, no icterus.   Conjunctiva pink. Ears:  Normal auditory acuity. Nose:  No deformity, discharge,  or lesions. Mouth:  No deformity or lesions. Neck:  Supple; no masses or thyromegaly. No significant cervical adenopathy. Lungs:  Clear throughout to auscultation.   No wheezes, crackles, or rhonchi. No acute distress. Heart:  Regular rate and rhythm; no murmurs, clicks, rubs,  or gallops. Abdomen: Obese. Non-distended, normal bowel sounds.  Soft and nontender without appreciable mass or hepatosplenomegaly.  Pulses:  Normal pulses noted. Extremities:  Without clubbing or edema.  Impression/Plan: 56 year old lady with nonspecific abdominal pain which she had dates going back to time of her colonoscopy last year. This is in a setting of chronic constipation. No alarm symptoms at this time. I told this nice lady that did not feel that her ongoing pain had anything to do with the colonoscopy at this point one year out. She will need surveillance colonoscopy 2 years from now. Her symptoms may be related more to constipation than anything else. Her abdominal exam today is remarkably normal aside from obesity.  Recommendations: We'll try a bowel regimen in the way of Benefiber 2 tablespoons every day. Align probiotic one capsule daily. MiraLax 17 g orally at bedtime nightly on any given day without a good bowel movement. Check stool for occult blood. We'll see her back in one month and assess her progress and then decide if further evaluation is needed.

## 2011-09-25 ENCOUNTER — Encounter: Payer: Self-pay | Admitting: Internal Medicine

## 2011-09-25 ENCOUNTER — Ambulatory Visit (INDEPENDENT_AMBULATORY_CARE_PROVIDER_SITE_OTHER): Payer: BC Managed Care – PPO | Admitting: Internal Medicine

## 2011-09-25 VITALS — BP 150/90 | HR 82 | Temp 98.3°F | Ht 65.0 in | Wt 197.2 lb

## 2011-09-25 DIAGNOSIS — Z1211 Encounter for screening for malignant neoplasm of colon: Secondary | ICD-10-CM

## 2011-09-25 DIAGNOSIS — Z860100 Personal history of colon polyps, unspecified: Secondary | ICD-10-CM

## 2011-09-25 DIAGNOSIS — K219 Gastro-esophageal reflux disease without esophagitis: Secondary | ICD-10-CM

## 2011-09-25 NOTE — Progress Notes (Signed)
Primary Care Physician:  Harlow Asa, MD Primary Gastroenterologist:  Dr. Jena Gauss  Pre-Procedure History & Physical: HPI:  Kaitlin [name] is a 56 y.o. female here for followup of abdominal pain. Patient complains of intermittent epigastric pain some typical reflux symptoms off and on depending on dietary indiscretion. Abdominal pain which he had related her colonoscopy one year ago has really subsided per her report. She failed to disclose she had been  given a prescription for Nexium by Dr. Gerda Diss but only takes the pills sporadically. She does admit when she takes them they do help her current GI symptoms. No dysphagia no melena.  Constipation is better with her new bowel regimen-see below.  Past Medical History  Diagnosis Date  . BMI 30.0-30.9,adult 2008 182 LBS    2009 194 LBS  . Helicobacter pylori gastritis AUG 2008 PREVPAC: nausea-->HELIDAC: GI UPSET    HB 14.1 NL HFP H. PYLORI STOOL Ag NEG  . Colon polyp APR 2008  . Anxiety   . Essential hypertension, benign   . Hypothyroidism   . Mixed hyperlipidemia   . Irritable bowel syndrome   . Tubulovillous adenoma 08/07/10    Colonoscopy w/ Dr Darrick Penna w/ 7 polyps removed-bx showed tubular adenomas.  Largest 1cm-hepatic flexure polyp->TA.      Past Surgical History  Procedure Date  . Colonoscopy APR 2008    AC/[place] SIMPLE ADENOMA  . Upper gastrointestinal endoscopy AUG 2008 PAIN/NAUSEA    H. PYLORI  . Cholecystectomy   . Mass excision 02/28/2011    Procedure: EXCISION MASS;  Surgeon: Fabio Bering, MD;  Location: [facility];  Service: General;  Laterality: Right;  soft tissue mass  . Colonoscopy 08/07/2010    Dr. Lorane Gell adenomas    Prior to Admission medications   Medication Sig Start Date End Date Taking? Authorizing Provider  ALPRAZolam Prudy Feeler) 1 MG tablet Take 1 mg by mouth 3 (three) times daily as needed. Anxiety   Yes Historical Provider, MD  enalapril (VASOTEC) 20 MG tablet Take 20 mg by mouth daily.     Yes Historical  Provider, MD  hydrochlorothiazide (MICROZIDE) 12.5 MG capsule Take 12.5 mg by mouth daily.   Yes Historical Provider, MD  indapamide (LOZOL) 1.25 MG tablet Take 1.25 mg by mouth every morning.     Yes Historical Provider, MD  levothyroxine (SYNTHROID, LEVOTHROID) 50 MCG tablet Take 50 mcg by mouth daily.     Yes Historical Provider, MD  lovastatin (MEVACOR) 40 MG tablet Take 40 mg by mouth at bedtime.     Yes Historical Provider, MD  verapamil (COVERA HS) 240 MG (CO) 24 hr tablet Take 240 mg by mouth daily.    Yes Historical Provider, MD    Allergies as of 09/25/2011  . (No Known Allergies)    Family History  Problem Relation Age of Onset  . Colon cancer Neg Hx   . Anesthesia problems Neg Hx   . Hypotension Neg Hx   . Malignant hyperthermia Neg Hx   . Pseudochol deficiency Neg Hx   . Coronary artery disease Mother   . Coronary artery disease Brother     Age 84    History   Social History  . Marital Status: Married    Spouse Name: N/A    Number of Children: N/A  . Years of Education: N/A   Occupational History  . Not on file.   Social History Main Topics  . Smoking status: Never Smoker   . Smokeless tobacco: Never Used  .  Alcohol Use: No  . Drug Use: No  . Sexually Active: Not on file   Other Topics Concern  . Not on file   Social History Narrative  . No narrative on file    Review of Systems: See HPI, otherwise negative ROS  Physical Exam: BP 150/90  Pulse 82  Temp 98.3 F (36.8 C) (Temporal)  Ht 5\' 5"  (1.651 m)  Wt 197 lb 3.2 oz (89.449 kg)  BMI 32.82 kg/m2 General:   Alert,  Well-developed, well-nourished, pleasant and cooperative in NAD Skin:  Intact without significant lesions or rashes. Eyes:  Sclera clear, no icterus.   Conjunctiva pink. Ears:  Normal auditory acuity. Nose:  No deformity, discharge,  or lesions. Mouth:  No deformity or lesions. Neck:  Supple; no masses or thyromegaly. No significant cervical adenopathy. Lungs:  Clear throughout  to auscultation.   No wheezes, crackles, or rhonchi. No acute distress. Heart:  Regular rate and rhythm; no murmurs, clicks, rubs,  or gallops. Abdomen: Non-distended, normal bowel sounds.  Soft and nontender without appreciable mass or hepatosplenomegaly.  Pulses:  Normal pulses noted. Extremities:  Without clubbing or edema.  Impression/Plan: Symptoms now more consistent with GERD with an element of the dyspepsia without alarm features. Not mentioned above, prior history H. pylori                                                   gastritis treated with antibiotics. Suspect her ongoing symptoms are essentially related to poorly treated GERD.   I have recommended she take Nexium 40 mg every day before breakfast for the next month. Call me and let me know how she's doing at the end of a one-month trial. We will then decide about further evaluation. Otherwise, plan to see her back in 6 months. We'll plan for surveillance colonoscopy 2 years from now.

## 2011-09-25 NOTE — Patient Instructions (Addendum)
Continue Nexium 40 mg daily  GERD information provided  Office visit in 6 months  Call me and let me how  you're doing in one month

## 2012-01-01 ENCOUNTER — Encounter (HOSPITAL_COMMUNITY): Payer: Self-pay | Admitting: *Deleted

## 2012-01-01 ENCOUNTER — Emergency Department (HOSPITAL_COMMUNITY)
Admission: EM | Admit: 2012-01-01 | Discharge: 2012-01-01 | Disposition: A | Discharge status: Home / Self Care | Payer: BC Managed Care – PPO | Attending: Emergency Medicine | Admitting: Emergency Medicine

## 2012-01-01 DIAGNOSIS — I10 Essential (primary) hypertension: Secondary | ICD-10-CM | POA: Insufficient documentation

## 2012-01-01 DIAGNOSIS — L301 Dyshidrosis [pompholyx]: Secondary | ICD-10-CM | POA: Insufficient documentation

## 2012-01-01 DIAGNOSIS — Z8659 Personal history of other mental and behavioral disorders: Secondary | ICD-10-CM | POA: Insufficient documentation

## 2012-01-01 DIAGNOSIS — Z79899 Other long term (current) drug therapy: Secondary | ICD-10-CM | POA: Insufficient documentation

## 2012-01-01 DIAGNOSIS — E785 Hyperlipidemia, unspecified: Secondary | ICD-10-CM | POA: Insufficient documentation

## 2012-01-01 DIAGNOSIS — E039 Hypothyroidism, unspecified: Secondary | ICD-10-CM | POA: Insufficient documentation

## 2012-01-01 DIAGNOSIS — Z85038 Personal history of other malignant neoplasm of large intestine: Secondary | ICD-10-CM | POA: Insufficient documentation

## 2012-01-01 DIAGNOSIS — Z860100 Personal history of colon polyps, unspecified: Secondary | ICD-10-CM | POA: Insufficient documentation

## 2012-01-01 MED ORDER — TRIAMCINOLONE ACETONIDE 0.1 % EX CREA: TOPICAL_CREAM | Freq: Two times a day (BID) | CUTANEOUS | Status: DC

## 2012-01-01 NOTE — ED Provider Notes (Signed)
History     CSN: [id number]  Arrival date & time 01/01/12  0604   First MD Initiated Contact with Patient 01/01/12 [phone]      Chief Complaint  Patient presents with  . Foot Pain    (Consider location/radiation/quality/duration/timing/severity/associated sxs/prior treatment) HPI  Kaitlin [name] is a 56 y.o. female who presents to the Emergency Department complaining of pain to the center of her right foot that began [place] was [place] the beach. Stood in the sand, went home and walked barefoot in her condo. She remembers stepping on something in the condo. Now with a red patch on the bottom of her foot that is painful and itching. Denies drainage, fever, chills.  PCP Dr. Gerda Diss  Past Medical History  Diagnosis Date  . BMI 30.0-30.9,adult 2008 182 LBS    2009 194 LBS  . Helicobacter pylori gastritis AUG 2008 PREVPAC: nausea-->HELIDAC: GI UPSET    HB 14.1 NL HFP H. PYLORI STOOL Ag NEG  . Colon polyp APR 2008  . Anxiety   . Essential hypertension, benign   . Hypothyroidism   . Mixed hyperlipidemia   . Irritable bowel syndrome   . Tubulovillous adenoma(M8263/0) 08/07/10    Colonoscopy w/ Dr Darrick Penna w/ 7 polyps removed-bx showed tubular adenomas.  Largest 1cm-hepatic flexure polyp->TA.      Past Surgical History  Procedure Date  . Colonoscopy APR 2008    AC/[place] SIMPLE ADENOMA  . Upper gastrointestinal endoscopy AUG 2008 PAIN/NAUSEA    H. PYLORI  . Cholecystectomy   . Mass excision 02/28/2011    Procedure: EXCISION MASS;  Surgeon: Fabio Bering, MD;  Location: [facility];  Service: General;  Laterality: Right;  soft tissue mass  . Colonoscopy 08/07/2010    Dr. Lorane Gell adenomas    Family History  Problem Relation Age of Onset  . Colon cancer Neg Hx   . Anesthesia problems Neg Hx   . Hypotension Neg Hx   . Malignant hyperthermia Neg Hx   . Pseudochol deficiency Neg Hx   . Coronary artery disease Mother   . Coronary artery disease Brother     Age 74    History    Substance Use Topics  . Smoking status: Never Smoker   . Smokeless tobacco: Never Used  . Alcohol Use: No    OB History    Grav Para Term Preterm Abortions TAB SAB Ect Mult Living                  Review of Systems  Constitutional: Negative for fever.       10 Systems reviewed and are negative for acute change except as noted in the HPI.  HENT: Negative for congestion.   Eyes: Negative for discharge and redness.  Respiratory: Negative for cough and shortness of breath.   Cardiovascular: Negative for chest pain.  Gastrointestinal: Negative for vomiting and abdominal pain.  Musculoskeletal: Negative for back pain.  Skin: Negative for rash.       Pain to center of right foot  Neurological: Negative for syncope, numbness and headaches.  Psychiatric/Behavioral:       No behavior change.    Allergies  Review of patient's allergies indicates no known allergies.  Home Medications   Current Outpatient Rx  Name Route Sig Dispense Refill  . ALPRAZOLAM 1 MG PO TABS Oral Take 1 mg by mouth 3 (three) times daily as needed. Anxiety    . ENALAPRIL MALEATE 20 MG PO TABS Oral Take 20 mg by  mouth daily.      Marland Kitchen HYDROCHLOROTHIAZIDE 12.5 MG PO CAPS Oral Take 12.5 mg by mouth daily.    . INDAPAMIDE 1.25 MG PO TABS Oral Take 1.25 mg by mouth every morning.      Marland Kitchen LEVOTHYROXINE SODIUM 50 MCG PO TABS Oral Take 50 mcg by mouth daily.      Marland Kitchen LOVASTATIN 40 MG PO TABS Oral Take 40 mg by mouth at bedtime.      . TRIAMCINOLONE ACETONIDE 0.1 % EX CREA Topical Apply topically 2 (two) times daily. 30 g 0  . VERAPAMIL HCL ER (CO) 240 MG PO TB24 Oral Take 240 mg by mouth daily.       BP 172/96  Pulse 99  Temp 98.2 F (36.8 C) (Oral)  Resp 20  Ht 5\' 5"  (1.651 m)  Wt 193 lb (87.544 kg)  BMI 32.12 kg/m2  SpO2 98%  Physical Exam  Nursing note and vitals reviewed. Constitutional: She appears well-developed and well-nourished.       Awake, alert, nontoxic appearance.  HENT:  Head: Atraumatic.   Eyes: Right eye exhibits no discharge. Left eye exhibits no discharge.  Neck: Neck supple.  Pulmonary/Chest: Effort normal and breath sounds normal. She exhibits no tenderness.  Abdominal: Soft. There is no tenderness. There is no rebound.  Musculoskeletal: She exhibits no tenderness.       Baseline ROM, no obvious new focal weakness.  Neurological:       Mental status and motor strength appears baseline for patient and situation.  Skin: No rash noted.       Center of right foot with a patch of erythema. Several small raised papules with no drainage. C/w dyshidrotic eczema   Psychiatric: She has a normal mood and affect.    ED Course  Procedures (including critical care time)  Labs Reviewed - No data to display No results found.   1. Eczema, dyshidrotic       MDM  Patient with a painful, itching patch to the bottom of her foot c/w dyshidrotic eczema. Rx for triamcinolone.Pt stable in ED with no significant deterioration in condition.The patient appears reasonably screened and/or stabilized for discharge and I doubt any other medical condition or other [facility] requiring further screening, evaluation, or treatment in the ED at this time prior to discharge.  MDM Reviewed: nursing note and vitals           Nicoletta Dress. Colon Branch, MD 01/01/12 [id number]

## 2012-01-01 NOTE — ED Notes (Signed)
Pain now radiating up right leg

## 2012-01-01 NOTE — ED Notes (Signed)
Went to [employer] and walked on sand. Now has pain on bottom of right foot

## 2012-01-01 NOTE — ED Notes (Signed)
Band aid applied to bottom of right foot.

## 2012-01-05 ENCOUNTER — Encounter (HOSPITAL_COMMUNITY): Payer: Self-pay | Admitting: *Deleted

## 2012-01-05 ENCOUNTER — Emergency Department (HOSPITAL_COMMUNITY)
Admission: EM | Admit: 2012-01-05 | Discharge: 2012-01-05 | Disposition: A | Discharge status: Home / Self Care | Payer: BC Managed Care – PPO | Attending: Emergency Medicine | Admitting: Emergency Medicine

## 2012-01-05 DIAGNOSIS — K589 Irritable bowel syndrome without diarrhea: Secondary | ICD-10-CM | POA: Insufficient documentation

## 2012-01-05 DIAGNOSIS — Z79899 Other long term (current) drug therapy: Secondary | ICD-10-CM | POA: Insufficient documentation

## 2012-01-05 DIAGNOSIS — B029 Zoster without complications: Secondary | ICD-10-CM

## 2012-01-05 DIAGNOSIS — Z8719 Personal history of other diseases of the digestive system: Secondary | ICD-10-CM | POA: Insufficient documentation

## 2012-01-05 DIAGNOSIS — E038 Other specified hypothyroidism: Secondary | ICD-10-CM | POA: Insufficient documentation

## 2012-01-05 DIAGNOSIS — L509 Urticaria, unspecified: Secondary | ICD-10-CM

## 2012-01-05 DIAGNOSIS — E782 Mixed hyperlipidemia: Secondary | ICD-10-CM | POA: Insufficient documentation

## 2012-01-05 DIAGNOSIS — I10 Essential (primary) hypertension: Secondary | ICD-10-CM | POA: Insufficient documentation

## 2012-01-05 DIAGNOSIS — L500 Allergic urticaria: Secondary | ICD-10-CM | POA: Insufficient documentation

## 2012-01-05 DIAGNOSIS — F411 Generalized anxiety disorder: Secondary | ICD-10-CM | POA: Insufficient documentation

## 2012-01-05 MED ORDER — PREDNISONE 50 MG PO TABS
60.0000 mg | ORAL_TABLET | Freq: Once | ORAL | Status: AC
  Administered 2012-01-05: 60 mg via ORAL
  Filled 2012-01-05: qty 1

## 2012-01-05 MED ORDER — OXYCODONE-ACETAMINOPHEN 5-325 MG PO TABS: 1.0000 | ORAL_TABLET | ORAL | Status: AC | PRN

## 2012-01-05 MED ORDER — DIPHENHYDRAMINE HCL 25 MG PO CAPS
25.0000 mg | ORAL_CAPSULE | Freq: Once | ORAL | Status: AC
  Administered 2012-01-05: 25 mg via ORAL
  Filled 2012-01-05: qty 1

## 2012-01-05 MED ORDER — PREDNISONE 50 MG PO TABS: 50.0000 mg | ORAL_TABLET | Freq: Every day | ORAL | Status: DC

## 2012-01-05 NOTE — ED Provider Notes (Signed)
History     CSN: [id number]  Arrival date & time 01/05/12  0048   First MD Initiated Contact with Patient 01/05/12 0131      Chief Complaint  Patient presents with  . Rash  . Allergic Reaction    (Consider location/radiation/quality/duration/timing/severity/associated sxs/prior treatment) Patient is a 56 y.o. female presenting with rash and allergic reaction. The history is provided by the patient.  Rash   Allergic Reaction The primary symptoms are  rash.  She was here 3 days ago and told that she had plantar warts. She saw her PCP 2 days ago who diagnosed it as shingles and started her on valacyclovir. Tonight, she started breaking out in itchy areas which are present on her neck, inframammary area, legs, and arms. She denies any difficulty breathing or swallowing. She denies any dyspnea. She is complaining of a lot of pain in her right foot where she was diagnosed with shingles. She states she is barely able to walk because of pain in her foot. She states she was not given any prescriptions for pain killers.  Past Medical History  Diagnosis Date  . BMI 30.0-30.9,adult 2008 182 LBS    2009 194 LBS  . Helicobacter pylori gastritis AUG 2008 PREVPAC: nausea-->HELIDAC: GI UPSET    HB 14.1 NL HFP H. PYLORI STOOL Ag NEG  . Colon polyp APR 2008  . Anxiety   . Essential hypertension, benign   . Hypothyroidism   . Mixed hyperlipidemia   . Irritable bowel syndrome   . Tubulovillous adenoma(M8263/0) 08/07/10    Colonoscopy w/ Dr Darrick Penna w/ 7 polyps removed-bx showed tubular adenomas.  Largest 1cm-hepatic flexure polyp->TA.      Past Surgical History  Procedure Date  . Colonoscopy APR 2008    AC/[place] SIMPLE ADENOMA  . Upper gastrointestinal endoscopy AUG 2008 PAIN/NAUSEA    H. PYLORI  . Cholecystectomy   . Mass excision 02/28/2011    Procedure: EXCISION MASS;  Surgeon: Fabio Bering, MD;  Location: [facility];  Service: General;  Laterality: Right;  soft tissue mass  . Colonoscopy  08/07/2010    Dr. Lorane Gell adenomas    Family History  Problem Relation Age of Onset  . Colon cancer Neg Hx   . Anesthesia problems Neg Hx   . Hypotension Neg Hx   . Malignant hyperthermia Neg Hx   . Pseudochol deficiency Neg Hx   . Coronary artery disease Mother   . Coronary artery disease Brother     Age 29    History  Substance Use Topics  . Smoking status: Never Smoker   . Smokeless tobacco: Never Used  . Alcohol Use: No    OB History    Grav Para Term Preterm Abortions TAB SAB Ect Mult Living                  Review of Systems  Skin: Positive for rash.  All other systems reviewed and are negative.    Allergies  Review of patient's allergies indicates no known allergies.  Home Medications   Current Outpatient Rx  Name Route Sig Dispense Refill  . ALPRAZOLAM 1 MG PO TABS Oral Take 1 mg by mouth 3 (three) times daily as needed. Anxiety    . ENALAPRIL MALEATE 20 MG PO TABS Oral Take 20 mg by mouth daily.      Marland Kitchen HYDROCHLOROTHIAZIDE 12.5 MG PO CAPS Oral Take 12.5 mg by mouth daily.    . INDAPAMIDE 1.25 MG PO TABS Oral Take 1.25 mg  by mouth every morning.      Marland Kitchen LEVOTHYROXINE SODIUM 50 MCG PO TABS Oral Take 50 mcg by mouth daily.      Marland Kitchen LOVASTATIN 40 MG PO TABS Oral Take 40 mg by mouth at bedtime.      . TRIAMCINOLONE ACETONIDE 0.1 % EX CREA Topical Apply topically 2 (two) times daily. 30 g 0  . VERAPAMIL HCL ER (CO) 240 MG PO TB24 Oral Take 240 mg by mouth daily.     . OXYCODONE-ACETAMINOPHEN 5-325 MG PO TABS Oral Take 1 tablet by mouth every 4 (four) hours as needed for pain. 20 tablet 0  . OXYCODONE-ACETAMINOPHEN 5-325 MG PO TABS Oral Take 1 tablet by mouth every 4 (four) hours as needed for pain. 6 tablet 0    To-go pack  . PREDNISONE 50 MG PO TABS Oral Take 1 tablet (50 mg total) by mouth daily. 5 tablet 0    BP 163/95  Pulse 95  Temp 98.2 F (36.8 C) (Oral)  Resp 20  Ht 5\' 5"  (1.651 m)  Wt 174 lb (78.926 kg)  BMI 28.96 kg/m2  SpO2  96%  Physical Exam  Nursing note and vitals reviewed. 56 year old female, resting comfortably and in no acute distress. Vital signs are significant for hypertension with blood pressure 163/95. Oxygen saturation is 96%, which is normal. Head is normocephalic and atraumatic. PERRLA, EOMI. Oropharynx is clear. Neck is nontender and supple without adenopathy or JVD. Back is nontender and there is no CVA tenderness. Lungs are clear without rales, wheezes, or rhonchi. Chest is nontender. Heart has regular rate and rhythm without murmur. Abdomen is soft, flat, nontender without masses or hepatosplenomegaly and peristalsis is normoactive. Extremities have no cyanosis or edema, full range of motion is present. Skin has scattered urticarial lesions. They're present in the chin, inframammary area, proximal arms and proximal legs. There are relatively few lesions are present. Vesicles on erythematous to violaceous base present on the plantar surface of the right foot consistent with herpes zoster. Neurologic: Mental status is normal, cranial nerves are intact, there are no motor or sensory deficits.   ED Course  Procedures (including critical care time)  Labs Reviewed - No data to display No results found.   1. Urticaria   2. Herpes zoster       MDM  Urticaria which is probably secondary to valacyclovir. Herpes zoster involving the plantar surface of the right foot. She's advised to discontinue the valacyclovir. Because of chemical similarities between valacyclovir and the other anti-herpes medications, she will not be switched to a different antiviral agent. She is sent home with prescription for prednisone and is advised to use over-the-counter Claritin and Benadryl. She has not had any medication for pain at home, so she is given a prescription for Percocet.        Dione Booze, MD 01/05/12 [phone]

## 2012-01-05 NOTE — ED Notes (Signed)
Discharge instructions reviewed with pt, questions answered. Pt verbalized understanding.  

## 2012-01-05 NOTE — ED Notes (Signed)
Pt diagnosed with shingles from PCP, given Valtrex, pt broke out in a rash around ears, both breasts and neck. Pt instructed by med hotline to stop taking medicine and go to the ER.

## 2012-01-08 MED FILL — Oxycodone w/ Acetaminophen Tab 5-325 MG: ORAL | Qty: 6 | Status: AC

## 2012-02-12 ENCOUNTER — Encounter: Payer: Self-pay | Admitting: Internal Medicine

## 2012-02-22 ENCOUNTER — Other Ambulatory Visit: Payer: Self-pay | Admitting: Obstetrics & Gynecology

## 2012-02-22 DIAGNOSIS — Z139 Encounter for screening, unspecified: Secondary | ICD-10-CM

## 2012-03-10 ENCOUNTER — Ambulatory Visit (HOSPITAL_COMMUNITY)
Admission: RE | Admit: 2012-03-10 | Discharge: 2012-03-10 | Disposition: A | Discharge status: Home / Self Care | Payer: BC Managed Care – PPO | Source: Ambulatory Visit | Attending: Obstetrics & Gynecology | Admitting: Obstetrics & Gynecology

## 2012-03-10 DIAGNOSIS — Z139 Encounter for screening, unspecified: Secondary | ICD-10-CM

## 2012-03-10 DIAGNOSIS — Z1231 Encounter for screening mammogram for malignant neoplasm of breast: Secondary | ICD-10-CM | POA: Insufficient documentation

## 2012-06-09 ENCOUNTER — Encounter: Payer: Self-pay | Admitting: *Deleted

## 2012-06-12 ENCOUNTER — Ambulatory Visit (INDEPENDENT_AMBULATORY_CARE_PROVIDER_SITE_OTHER): Payer: BC Managed Care – PPO | Admitting: Family Medicine

## 2012-06-12 ENCOUNTER — Encounter: Payer: Self-pay | Admitting: Family Medicine

## 2012-06-12 VITALS — BP 122/86 | HR 70 | Wt 203.8 lb

## 2012-06-12 DIAGNOSIS — E039 Hypothyroidism, unspecified: Secondary | ICD-10-CM

## 2012-06-12 DIAGNOSIS — G609 Hereditary and idiopathic neuropathy, unspecified: Secondary | ICD-10-CM

## 2012-06-12 DIAGNOSIS — I872 Venous insufficiency (chronic) (peripheral): Secondary | ICD-10-CM

## 2012-06-12 DIAGNOSIS — I878 Other specified disorders of veins: Secondary | ICD-10-CM

## 2012-06-12 DIAGNOSIS — I10 Essential (primary) hypertension: Secondary | ICD-10-CM

## 2012-06-12 DIAGNOSIS — J329 Chronic sinusitis, unspecified: Secondary | ICD-10-CM

## 2012-06-12 MED ORDER — CEFTRIAXONE SODIUM 1 G IJ SOLR
500.0000 mg | Freq: Once | INTRAMUSCULAR | Status: AC
  Administered 2012-06-12: 500 mg via INTRAMUSCULAR

## 2012-06-12 MED ORDER — CLINDAMYCIN HCL 150 MG PO CAPS: 150.0000 mg | ORAL_CAPSULE | Freq: Four times a day (QID) | ORAL | Status: AC

## 2012-06-12 NOTE — Progress Notes (Signed)
  Subjective:    Patient ID: Kaitlin [name], female    DOB: [birth year], 57 y.o.   MRN: [id number]  Sinusitis This is a recurrent problem. The problem has been waxing and waning since onset. There has been no fever. Her pain is at a severity of 3/10. The pain is moderate.  Hypertension This is a chronic problem. The current episode started more than 1 year ago. The problem has been gradually worsening since onset. Associated symptoms include anxiety, chest pain and malaise/fatigue. There are no associated agents to hypertension. There are no known risk factors for coronary artery disease. The current treatment provides moderate improvement.    Patient also notes diminished energy at times Patient also notes swelling in her feet. Worse after standing for long periods. Progressive recently. Sometimes uncomfortable. Worried that it might be a sign of something more serious. Otherwise negative. Review of Systems  Constitutional: Positive for malaise/fatigue.  Cardiovascular: Positive for chest pain.   Otherwise negative    Objective:   Physical Exam  Alert vital signs stable. HEENT moderate nasal congestion. Frontal tenderness. Lungs clear. Heart regular rate and rhythm. Ankles 1+ edema. Pulses good sensation good. Neuro intact.      Assessment & Plan:  Impression 1 sinusitis patient insisted on an injection. #2 hypertension good control. #3 fatigue ongoing. #4 probable venous stasis-discussed. Patient afraid of more serious etiology. Plan appropriate blood work. Rocephin injection. Antibiotics prescribed. Diet exercise discussed. Recheck in 6 months. WSL

## 2012-06-12 NOTE — Patient Instructions (Signed)
Trying to exercise regularly. Cut down salt intake. Elevate legs when possible.

## 2012-06-15 LAB — LIPID PANEL
HDL: 49 mg/dL (ref >39)
LDL Cholesterol: 113 mg/dL — ABNORMAL HIGH (ref 0–99)
Total CHOL/HDL Ratio: 3.8 Ratio
VLDL: 22 mg/dL (ref 0–40)

## 2012-06-15 LAB — HEPATIC FUNCTION PANEL
AST: 27 U/L (ref 0–37)
Albumin: 4.5 g/dL (ref 3.5–5.2)
Total Bilirubin: 0.5 mg/dL (ref 0.3–1.2)

## 2012-06-15 LAB — BASIC METABOLIC PANEL
CO2: 27 mEq/L (ref 19–32)
Calcium: 10.6 mg/dL — ABNORMAL HIGH (ref 8.4–10.5)
Potassium: 4.2 mEq/L (ref 3.5–5.3)
Sodium: 139 mEq/L (ref 135–145)

## 2012-06-16 ENCOUNTER — Telehealth: Payer: Self-pay | Admitting: Family Medicine

## 2012-06-16 NOTE — Telephone Encounter (Signed)
Thanks for sharing!

## 2012-06-16 NOTE — Telephone Encounter (Signed)
Yes, this is normal.

## 2012-06-16 NOTE — Telephone Encounter (Signed)
6 small blisters also came up at the spot she got the injection.

## 2012-06-16 NOTE — Telephone Encounter (Signed)
Pt states that her right leg is having pain since she got her shot in her hip last Friday, 06/13/12.Marland KitchenMarland Kitchenplease advise pt if this is normal.  I asked the nurses and they wanted me to send across this note .

## 2012-06-16 NOTE — Telephone Encounter (Signed)
Discussed with pt. Pt to call back if leg doesn't get better and dr notified about the blisters.

## 2012-06-24 ENCOUNTER — Ambulatory Visit (INDEPENDENT_AMBULATORY_CARE_PROVIDER_SITE_OTHER): Payer: BC Managed Care – PPO | Admitting: Internal Medicine

## 2012-06-24 ENCOUNTER — Encounter: Payer: Self-pay | Admitting: Internal Medicine

## 2012-06-24 ENCOUNTER — Other Ambulatory Visit: Payer: Self-pay

## 2012-06-24 VITALS — BP 120/80 | HR 75 | Temp 98.0°F | Ht 65.0 in | Wt 204.4 lb

## 2012-06-24 DIAGNOSIS — D126 Benign neoplasm of colon, unspecified: Secondary | ICD-10-CM

## 2012-06-24 DIAGNOSIS — K219 Gastro-esophageal reflux disease without esophagitis: Secondary | ICD-10-CM

## 2012-06-24 MED ORDER — ESOMEPRAZOLE MAGNESIUM 40 MG PO CPDR: 40.0000 mg | DELAYED_RELEASE_CAPSULE | Freq: Every day | ORAL | Status: DC

## 2012-06-24 NOTE — Patient Instructions (Addendum)
Resume Nexium 40 mg daily  Office visit in June 2015 to set up a colonoscopy

## 2012-06-24 NOTE — Progress Notes (Signed)
Primary Care Physician:  Harlow Asa, MD Primary Gastroenterologist:  Dr. Jena Gauss  Pre-Procedure History & Physical: HPI:  Kaitlin [name] is a 57 y.o. female here for followup of GERD and advanced colonic adenoma. It showed multiple adenomatous/tubulovillous adenomas removed from her colon in 2012. Post procedure course complicated by what sounds like a post polypectomy syndrome. She's doing well this point in time. She has no lower GI tract symptoms. She's had a worsening of reflux in the setting of progressive weight gain. Moreover, she has come off her Nexium (ran out of her prescription.). No dysphagia.  Past Medical History  Diagnosis Date  . BMI 30.0-30.9,adult 2008 182 LBS    2009 194 LBS  . Helicobacter pylori gastritis AUG 2008 PREVPAC: nausea-->HELIDAC: GI UPSET    HB 14.1 NL HFP H. PYLORI STOOL Ag NEG  . Colon polyp APR 2008  . Anxiety   . Essential hypertension, benign   . Hypothyroidism   . Mixed hyperlipidemia   . Irritable bowel syndrome   . Tubulovillous adenoma(M8263/0) 08/07/10    Colonoscopy w/ Dr Darrick Penna w/ 7 polyps removed-bx showed tubular adenomas.  Largest 1cm-hepatic flexure polyp->TA.    Marland Kitchen Panic attacks   . Reflux     Past Surgical History  Procedure Laterality Date  . Colonoscopy  APR 2008    [facility] SIMPLE ADENOMA  . Upper gastrointestinal endoscopy  AUG 2008 PAIN/NAUSEA    H. PYLORI treated  . Cholecystectomy    . Mass excision  02/28/2011    Procedure: EXCISION MASS;  Surgeon: Fabio Bering, MD;  Location: [facility];  Service: General;  Laterality: Right;  soft tissue mass  . Colonoscopy  08/07/2010    Dr. Lorane Gell adenomas    Prior to Admission medications   Medication Sig Start Date End Date Taking? Authorizing Provider  ALPRAZolam Prudy Feeler) 1 MG tablet Take 1 mg by mouth 3 (three) times daily as needed. Anxiety   Yes Historical Provider, MD  enalapril (VASOTEC) 20 MG tablet Take 20 mg by mouth daily.     Yes Historical Provider, MD  indapamide  (LOZOL) 1.25 MG tablet Take 1.25 mg by mouth every morning.     Yes Historical Provider, MD  levothyroxine (SYNTHROID, LEVOTHROID) 50 MCG tablet Take 50 mcg by mouth daily.     Yes Historical Provider, MD  lovastatin (MEVACOR) 40 MG tablet Take 40 mg by mouth at bedtime.     Yes Historical Provider, MD  verapamil (CALAN-SR) 240 MG CR tablet Take 240 mg by mouth daily.   Yes Historical Provider, MD    Allergies as of 06/24/2012 - Review Complete 06/24/2012  Allergen Reaction Noted  . Asa (aspirin)  06/09/2012  . Augmentin (amoxicillin-pot clavulanate)  06/09/2012  . Rocephin (ceftriaxone sodium in dextrose)  06/09/2012  . Valtrex (valacyclovir hcl)  06/09/2012  . Zoloft (sertraline hcl)  06/09/2012    Family History  Problem Relation Age of Onset  . Colon cancer Neg Hx   . Anesthesia problems Neg Hx   . Hypotension Neg Hx   . Malignant hyperthermia Neg Hx   . Pseudochol deficiency Neg Hx   . Coronary artery disease Mother   . Hypertension Mother   . Coronary artery disease Brother     Age 22    History   Social History  . Marital Status: Married    Spouse Name: N/A    Number of Children: N/A  . Years of Education: N/A   Occupational History  . Not on file.  Social History Main Topics  . Smoking status: Never Smoker   . Smokeless tobacco: Never Used  . Alcohol Use: No  . Drug Use: No  . Sexually Active: Not on file   Other Topics Concern  . Not on file   Social History Narrative  . No narrative on file    Review of Systems: See HPI, otherwise negative ROS  Physical Exam: BP 120/80  Pulse 75  Temp(Src) 98 F (36.7 C) (Oral)  Ht 5\' 5"  (1.651 m)  Wt 204 lb 6.4 oz (92.715 kg)  BMI 34.01 kg/m2 General:   Alert,  Well-developed, well-nourished, pleasant and cooperative in NAD Skin:  Intact without significant lesions or rashes. Eyes:  Sclera clear, no icterus.   Conjunctiva pink. Ears:  Normal auditory acuity. Nose:  No deformity, discharge,  or  lesions. Mouth:  No deformity or lesions. Neck:  Supple; no masses or thyromegaly. No significant cervical adenopathy. Lungs:  Clear throughout to auscultation.   No wheezes, crackles, or rhonchi. No acute distress. Heart:  Regular rate and rhythm; no murmurs, clicks, rubs,  or gallops. Abdomen: Obese. Positive bowel sounds soft and nontender without obvious mass organomegaly  Extremities:  Without clubbing or edema.  Impression/Plan:  57 year old lady with chronic GERD in the setting of progressive weight gain -Nexium did help with her symptoms tremendously but she ran out of medication recently. History of advanced adenomas (multiple) prior colonoscopy. She's had a tough time with the last couple of colonoscopies citing poor sedation during the procedure and pain following her last colonoscopy.  Recommendations:  Resume Nexium 40 mg orally daily. [facility] [place]. The benefits of chronic acid suppression therapy outweigh the risks in this setting. 15 pound weight loss between now and the end of the calendar year. Plan for repeat surveillance colonoscopy June of 2015. I will offer her propofol sedation and CO2 insufflation with her next examination in the hopes of achieving a better experience for this nice lady. Unless something comes up, we'll plan to see her back in the office in one year.

## 2012-07-07 ENCOUNTER — Other Ambulatory Visit: Payer: Self-pay | Admitting: Obstetrics & Gynecology

## 2012-07-14 ENCOUNTER — Encounter: Payer: Self-pay | Admitting: *Deleted

## 2012-07-15 ENCOUNTER — Other Ambulatory Visit: Payer: Self-pay | Admitting: Obstetrics & Gynecology

## 2012-08-14 ENCOUNTER — Other Ambulatory Visit: Payer: Self-pay | Admitting: Family Medicine

## 2012-08-14 ENCOUNTER — Other Ambulatory Visit: Payer: Self-pay | Admitting: *Deleted

## 2012-09-01 ENCOUNTER — Ambulatory Visit (INDEPENDENT_AMBULATORY_CARE_PROVIDER_SITE_OTHER): Payer: BC Managed Care – PPO | Admitting: Obstetrics & Gynecology

## 2012-09-01 ENCOUNTER — Encounter: Payer: Self-pay | Admitting: Obstetrics & Gynecology

## 2012-09-01 VITALS — BP 122/80 | Ht 62.0 in | Wt 195.0 lb

## 2012-09-01 DIAGNOSIS — D252 Subserosal leiomyoma of uterus: Secondary | ICD-10-CM | POA: Insufficient documentation

## 2012-09-01 DIAGNOSIS — Z01419 Encounter for gynecological examination (general) (routine) without abnormal findings: Secondary | ICD-10-CM

## 2012-09-01 NOTE — Patient Instructions (Addendum)
Fibroids Fibroids are lumps (tumors) that can occur any place in a woman's body. These lumps are not cancerous. Fibroids vary in size, weight, and where they grow. HOME CARE  Do not take aspirin.  Write down the number of pads or tampons you use during your period. Tell your doctor. This can help determine the best treatment for you. GET HELP RIGHT AWAY IF:  You have pain in your lower belly (abdomen) that is not helped with medicine.  You have cramps that are not helped with medicine.  You have more bleeding between or during your period.  You feel lightheaded or pass out (faint).  Your lower belly pain gets worse. MAKE SURE YOU:  Understand these instructions.  Will watch your condition.  Will get help right away if you are not doing well or get worse. Document Released: 03/24/2010 Document Revised: 05/14/2011 Document Reviewed: 03/24/2010 [employer] Patient Information 2014 [employer].  

## 2012-09-01 NOTE — Progress Notes (Signed)
Patient ID: Kaitlin [name], female   DOB: 08/10/1955, 57 y.o.   MRN: [id number] Subjective:     LORRAINE [name] is a 57 y.o. female here for a routine exam.  No LMP recorded. Patient is postmenopausal. G1P1 Current complaints: none.  Personal health questionnaire reviewed: no.   Gynecologic History No LMP recorded. Patient is postmenopausal. Contraception: post menopausal status Last Pap: 2013. Results were: normal Last mammogram: 2014. Results were: normal  Obstetric History OB History   Grav Para Term Preterm Abortions TAB SAB Ect Mult Living   1 1             # Outc Date GA Lbr Len/2nd Wgt Sex Del Anes PTL Lv   1 PAR                The following portions of the patient's history were reviewed and updated as appropriate: allergies, current medications, past family history, past medical history, past social history, past surgical history and problem list.  Review of Systems  Review of Systems  Constitutional: Negative for fever, chills, weight loss, malaise/fatigue and diaphoresis.  HENT: Negative for hearing loss, ear pain, nosebleeds, congestion, sore throat, neck pain, tinnitus and ear discharge.   Eyes: Negative for blurred vision, double vision, photophobia, pain, discharge and redness.  Respiratory: Negative for cough, hemoptysis, sputum production, shortness of breath, wheezing and stridor.   Cardiovascular: Negative for chest pain, palpitations, orthopnea, claudication, leg swelling and PND.  Gastrointestinal: negative for abdominal pain. Negative for heartburn, nausea, vomiting, diarrhea, constipation, blood in stool and melena.  Genitourinary: Negative for dysuria, urgency, frequency, hematuria and flank pain.  Musculoskeletal: Negative for myalgias, back pain, joint pain and falls.  Skin: Negative for itching and rash.  Neurological: Negative for dizziness, tingling, tremors, sensory change, speech change, focal weakness, seizures, loss of consciousness, weakness  and headaches.  Endo/Heme/Allergies: Negative for environmental allergies and polydipsia. Does not bruise/bleed easily.  Psychiatric/Behavioral: Negative for depression, suicidal ideas, hallucinations, memory loss and substance abuse. The patient is not nervous/anxious and does not have insomnia.        Objective:    Physical Exam  Vitals reviewed. Constitutional: She is oriented to person, place, and time. She appears well-developed and well-nourished.  HENT:  Head: Normocephalic and atraumatic.        Right Ear: External ear normal.  Left Ear: External ear normal.  Nose: Nose normal.  Mouth/Throat: Oropharynx is clear and moist.  Eyes: Conjunctivae and EOM are normal. Pupils are equal, round, and reactive to light. Right eye exhibits no discharge. Left eye exhibits no discharge. No scleral icterus.  Neck: Normal range of motion. Neck supple. No tracheal deviation present. No thyromegaly present.  Cardiovascular: Normal rate, regular rhythm, normal heart sounds and intact distal pulses.  Exam reveals no gallop and no friction rub.   No murmur heard. Respiratory: Effort normal and breath sounds normal. No respiratory distress. She has no wheezes. She has no rales. She exhibits no tenderness.  GI: Soft. Bowel sounds are normal. She exhibits no distension and no mass. There is no tenderness. There is no rebound and no guarding.  Genitourinary:       Vulva is normal without lesions Vagina is pink moist without discharge Cervix normal in appearance and pap is done Uterus is enlarged from fibroid uterus stable  Adnexa is negative with normal sized ovaries   Musculoskeletal: Normal range of motion. She exhibits no edema and no tenderness.  Neurological: She is alert and oriented  to person, place, and time. She has normal reflexes. She displays normal reflexes. No cranial nerve deficit. She exhibits normal muscle tone. Coordination normal.  Skin: Skin is warm and dry. No rash noted. No  erythema. No pallor.  Psychiatric: She has a normal mood and affect. Her behavior is normal. Judgment and thought content normal.       Assessment:    Healthy female exam.    Plan:    Follow up in: 1 year.

## 2012-09-17 ENCOUNTER — Other Ambulatory Visit: Payer: Self-pay | Admitting: Family Medicine

## 2012-09-18 ENCOUNTER — Telehealth: Payer: Self-pay | Admitting: Family Medicine

## 2012-09-18 NOTE — Telephone Encounter (Signed)
Needs refill on ALPRAZolam Prudy Feeler) 1 MG tablet [[id number]]--[facility] [address]  Patient is completely  Thanks

## 2012-09-18 NOTE — Telephone Encounter (Signed)
Ok plus two ref 

## 2012-09-18 NOTE — Telephone Encounter (Signed)
Last office visit 06/12/12

## 2012-09-19 NOTE — Telephone Encounter (Signed)
Alprazolam 1 mg # 90 one tid with 2 monthly refills per Dr Brett Canales called [facility]. Pt notified.

## 2012-09-26 ENCOUNTER — Encounter: Payer: Self-pay | Admitting: Adult Health

## 2012-09-26 ENCOUNTER — Ambulatory Visit (INDEPENDENT_AMBULATORY_CARE_PROVIDER_SITE_OTHER): Payer: BC Managed Care – PPO | Admitting: Adult Health

## 2012-09-26 VITALS — BP 150/98 | Ht 65.0 in | Wt 198.0 lb

## 2012-09-26 DIAGNOSIS — L739 Follicular disorder, unspecified: Secondary | ICD-10-CM

## 2012-09-26 DIAGNOSIS — L738 Other specified follicular disorders: Secondary | ICD-10-CM

## 2012-09-26 DIAGNOSIS — N3946 Mixed incontinence: Secondary | ICD-10-CM | POA: Insufficient documentation

## 2012-09-26 HISTORY — DX: Follicular disorder, unspecified: L73.9

## 2012-09-26 HISTORY — DX: Mixed incontinence: N39.46

## 2012-09-26 MED ORDER — SULFAMETHOXAZOLE-TMP DS 800-160 MG PO TABS: 1.0000 | ORAL_TABLET | Freq: Two times a day (BID) | ORAL | Status: DC

## 2012-09-26 NOTE — Progress Notes (Signed)
Subjective:     Patient ID: Kaitlin [name], female   DOB: February 12, 1956, 57 y.o.   MRN: [id number]  HPI Kaitlin [name] found a bump in vagina area about a week ago, and she says she gets them occasionally  on mons pubis.She also complains of mixed urinary incontinence but declines meds.  Review of Systems See HPI Reviewed past medical,surgical, social and family history. Reviewed medications and allergies.     Objective:   Physical Exam BP 150/98  Ht 5\' 5"  (1.651 m)  Wt 198 lb (89.812 kg)  BMI 32.95 kg/m2   Skin warm and dry.Pelvic: external genitalia is normal in appearance for age,except she has 1 area of irritation and ?small sebaceous cyst, vagina: slightly atrophic,she relaxation of vagina walls, cervix:smooth and bulbous, uterus: enlarged in size, non tender, no masses felt, adnexa: no masses or tenderness noted.  Assessment:     Folliculitis Mixed urinary incontinence    Plan:     Rx septra ds 1 bid x 10 days  Bath with dial bar soap, do not shave Follow up prn Review handout on folliculitis

## 2012-09-26 NOTE — Patient Instructions (Addendum)
Folliculitis  Folliculitis is redness, soreness, and swelling (inflammation) of the hair follicles. This condition can occur anywhere on the body. People with weakened immune systems, diabetes, or obesity have a greater risk of getting folliculitis. CAUSES  Bacterial infection. This is the most common cause.  Fungal infection.  Viral infection.  Contact with certain chemicals, especially oils and tars. Long-term folliculitis can result from bacteria that live in the nostrils. The bacteria may trigger multiple outbreaks of folliculitis over time. SYMPTOMS Folliculitis most commonly occurs on the scalp, thighs, legs, back, buttocks, and areas where hair is shaved frequently. An early sign of folliculitis is a small, white or yellow, pus-filled, itchy lesion (pustule). These lesions appear on a red, inflamed follicle. They are usually less than 0.2 inches (5 mm) wide. When there is an infection of the follicle that goes deeper, it becomes a boil or furuncle. A group of closely packed boils creates a larger lesion (carbuncle). Carbuncles tend to occur in hairy, sweaty areas of the body. DIAGNOSIS  Your caregiver can usually tell what is wrong by doing a physical exam. A sample may be taken from one of the lesions and tested in a lab. This can help determine what is causing your folliculitis. TREATMENT  Treatment may include:  Applying warm compresses to the affected areas.  Taking antibiotic medicines orally or applying them to the skin.  Draining the lesions if they contain a large amount of pus or fluid.  Laser hair removal for cases of long-lasting folliculitis. This helps to prevent regrowth of the hair. HOME CARE INSTRUCTIONS  Apply warm compresses to the affected areas as directed by your caregiver.  If antibiotics are prescribed, take them as directed. Finish them even if you start to feel better.  You may take over-the-counter medicines to relieve itching.  Do not shave  irritated skin.  Follow up with your caregiver as directed. SEEK IMMEDIATE MEDICAL CARE IF:   You have increasing redness, swelling, or pain in the affected area.  You have a fever. MAKE SURE YOU:  Understand these instructions.  Will watch your condition.  Will get help right away if you are not doing well or get worse. Document Released: 04/30/2001 Document Revised: 08/21/2011 Document Reviewed: 05/22/2011 [facility] Patient Information 2014 [place], [place]. Take take septra Follow up prn

## 2012-10-15 ENCOUNTER — Other Ambulatory Visit: Payer: Self-pay | Admitting: Family Medicine

## 2012-11-10 ENCOUNTER — Encounter: Payer: Self-pay | Admitting: Family Medicine

## 2012-11-10 ENCOUNTER — Ambulatory Visit (HOSPITAL_COMMUNITY)
Admission: RE | Admit: 2012-11-10 | Discharge: 2012-11-10 | Disposition: A | Discharge status: Home / Self Care | Payer: BC Managed Care – PPO | Source: Ambulatory Visit | Attending: Family Medicine | Admitting: Family Medicine

## 2012-11-10 ENCOUNTER — Ambulatory Visit (INDEPENDENT_AMBULATORY_CARE_PROVIDER_SITE_OTHER): Payer: BC Managed Care – PPO | Admitting: Family Medicine

## 2012-11-10 VITALS — BP 124/90 | Ht 65.0 in | Wt 191.2 lb

## 2012-11-10 DIAGNOSIS — M79602 Pain in left arm: Secondary | ICD-10-CM

## 2012-11-10 DIAGNOSIS — M79605 Pain in left leg: Secondary | ICD-10-CM

## 2012-11-10 DIAGNOSIS — M79609 Pain in unspecified limb: Secondary | ICD-10-CM | POA: Insufficient documentation

## 2012-11-10 IMAGING — CR DG TIBIA/FIBULA 2V*L*
2 series · 2 of 2 positions shown · non-contrast
Comparison: [DATE]

CLINICAL DATA: Left lower leg pain for 3 weeks, no known injury

LEFT TIBIA AND FIBULA - 2 VIEW

[view not recorded (1 of 2)]
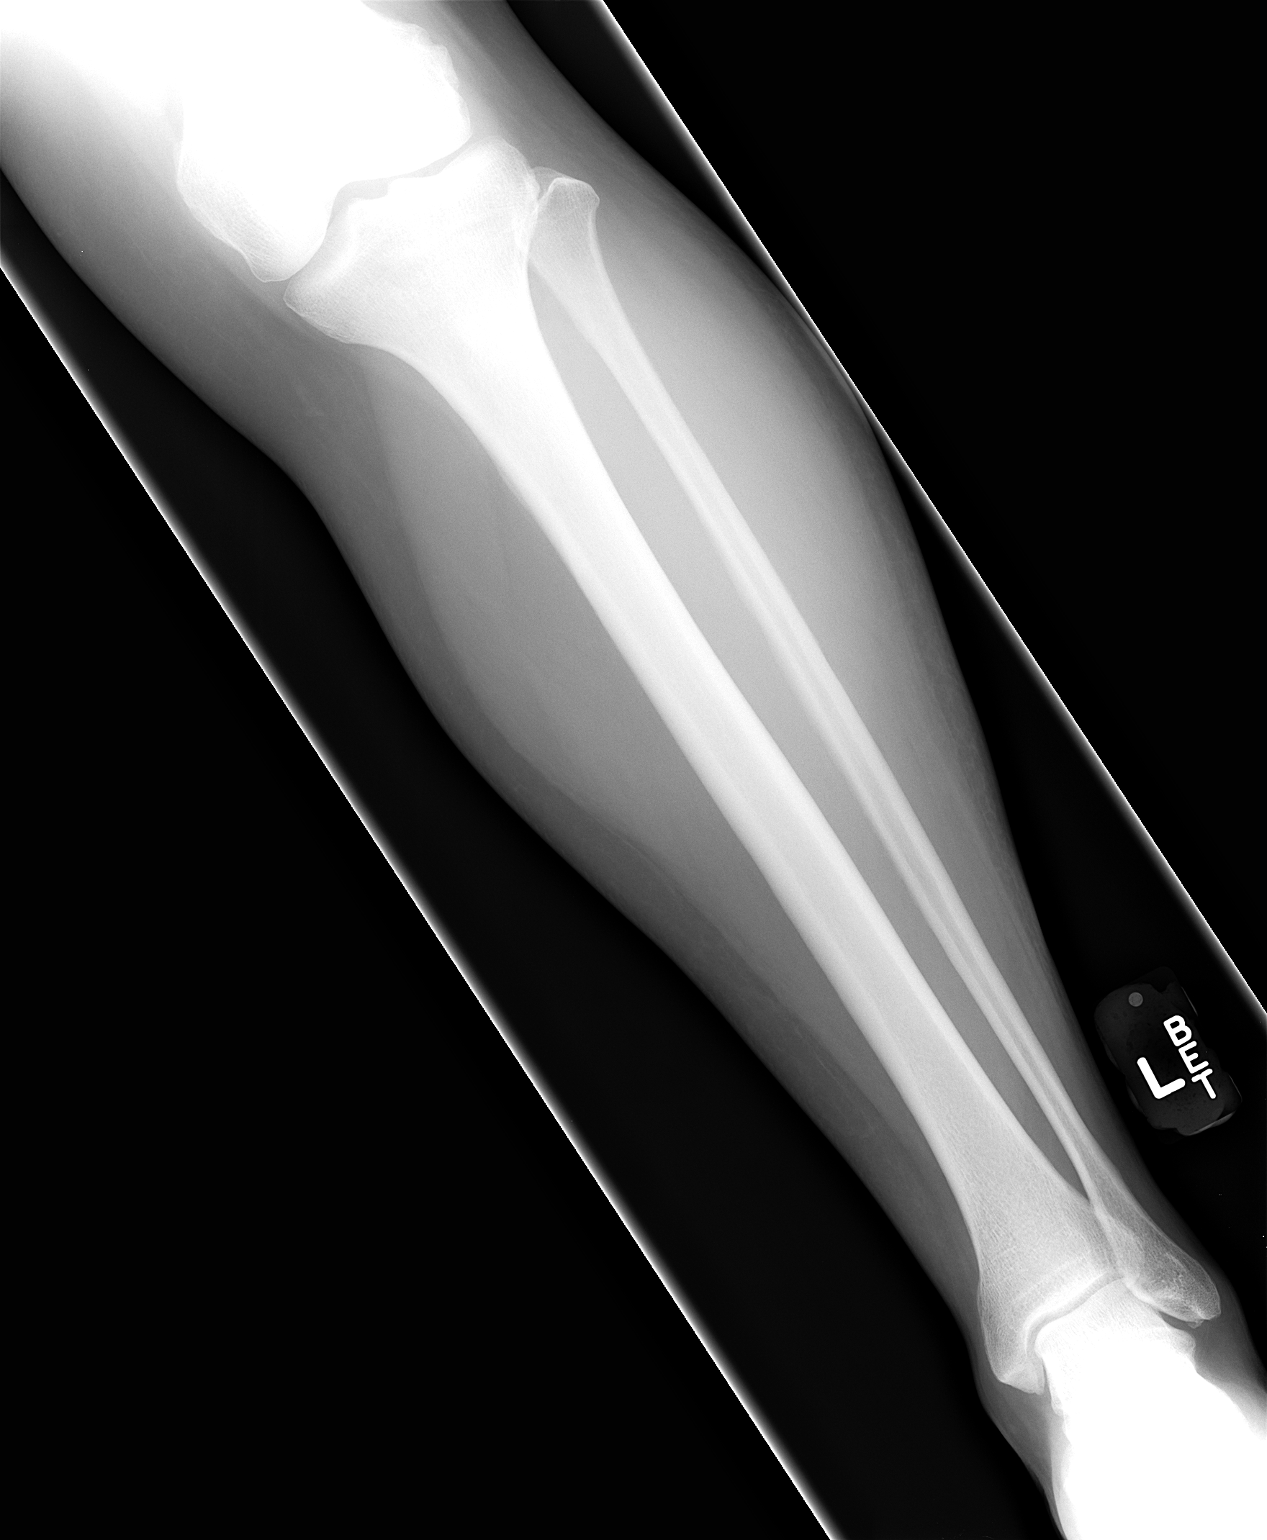

[view not recorded (2 of 2)]
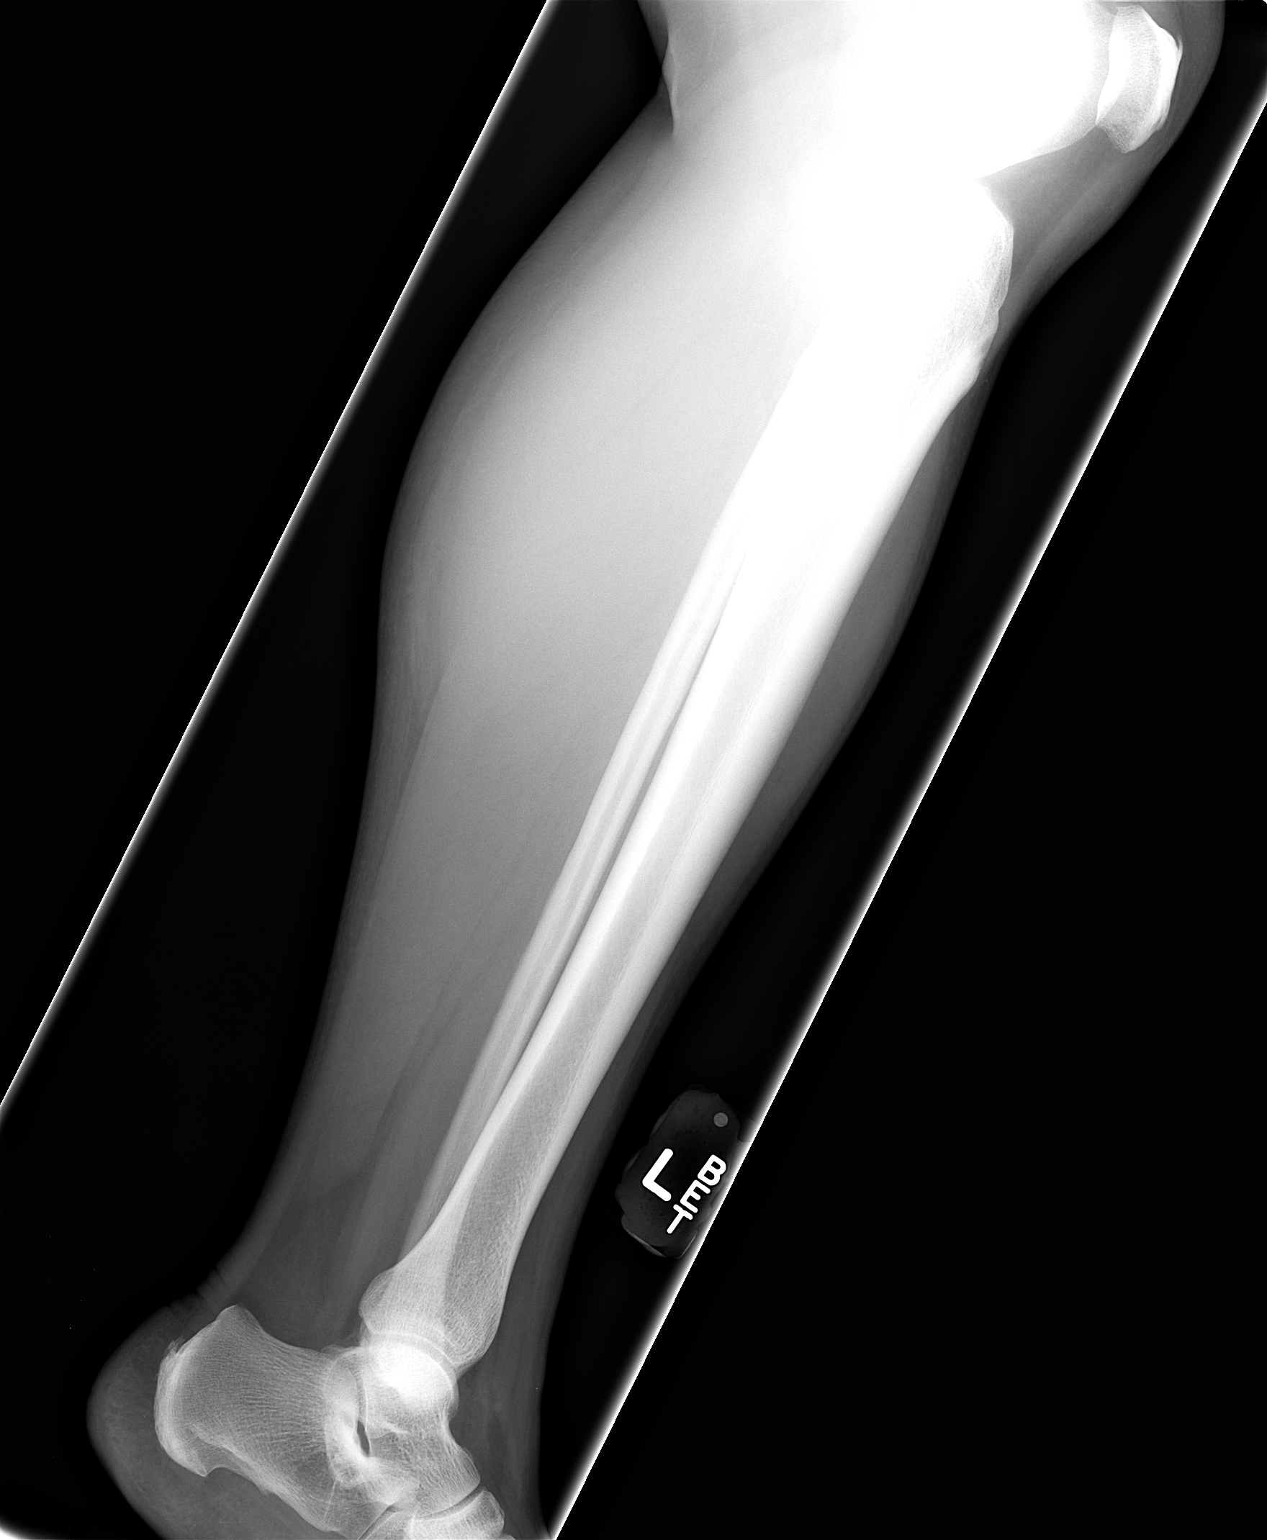

[2 of 2 positions shown; findings below may reference images not displayed]

FINDINGS: Two views of the left tibia-fibula submitted.  No acute
fracture or subluxation.  No periosteal reaction or bony erosion.
IMPRESSION: No acute fracture or subluxation.  No periosteal reaction or bony
erosion.

## 2012-11-10 MED ORDER — NABUMETONE 500 MG PO TABS: 500.0000 mg | ORAL_TABLET | Freq: Two times a day (BID) | ORAL | Status: DC

## 2012-11-10 NOTE — Progress Notes (Signed)
  Subjective:    Patient ID: Kaitlin [name], female    DOB: 1955/03/29, 57 y.o.   MRN: [id number]  HPI Comments: Hot to touch, radiates from left shin to left arm, neck, and to right hip. Originally started 2 months ago, and now pain is worst and radiating.   Leg Pain  The incident occurred more than 1 week ago. The pain is present in the left leg, left hip, left thigh, left knee and right hip. The quality of the pain is described as burning and shooting. Associated symptoms include tingling.   Chest pain, ant burning, comes and goes, rad to left arm, sometimes sharp. Associated times with exertion. Sometimes at rest. Patient is concerned about her heart. He's had negative workup in the past of chest pain.  No overtime,  Walking stress level much better   Review of Systems  Neurological: Positive for tingling.   Intermittent tingling and left hand comes and goes. No vomiting no diarrhea no nausea no diaphoresis no shortness of breath ROS otherwise negative.    Objective:   Physical Exam Alert anxious appearing hydration good. HEENT moderate nasal congestion. Pharynx normal neck supple. Lungs clear. Heart regular rate and rhythm. Blood pressure good on repeat. Left anterior shin tender along medial tibia no obvious swelling no deformity. Hands no deformities.  EKG normal sinus rhythm no significant ST-T changes.       Assessment & Plan:  Impression 1 chest pain very atypical the patient worried I do not feel mega-heart workup is warranted at this time discussed #2 leg pain progressive could be simply muscular. X-ray warranted. #3 hypertension good control on repeat. Plan exercise encourage. Relafen when necessary for leg pain. Warning signs discussed. Easily 25 minutes spent most in discussion. WSL

## 2012-11-11 ENCOUNTER — Telehealth: Payer: Self-pay | Admitting: Family Medicine

## 2012-11-11 NOTE — Telephone Encounter (Signed)
Notified patient stay on meds for now slight swelling usually fades as folks get more active. Patient verbalized understanding.

## 2012-11-11 NOTE — Telephone Encounter (Signed)
Pt states that Dr Brett Canales put her on nabumetone (RELAFEN) 500 MG tablet  Yesterday and now her ankles are swelling. What do you advise?  [facility]

## 2012-11-11 NOTE — Telephone Encounter (Signed)
Stay on meds for now slight swelling usually fades as folks get more active

## 2012-11-21 ENCOUNTER — Other Ambulatory Visit: Payer: Self-pay | Admitting: Family Medicine

## 2013-01-21 ENCOUNTER — Other Ambulatory Visit: Payer: Self-pay | Admitting: Family Medicine

## 2013-02-12 ENCOUNTER — Encounter: Payer: Self-pay | Admitting: Family Medicine

## 2013-02-12 ENCOUNTER — Ambulatory Visit (INDEPENDENT_AMBULATORY_CARE_PROVIDER_SITE_OTHER): Payer: BC Managed Care – PPO | Admitting: Family Medicine

## 2013-02-12 VITALS — BP 132/90 | Ht 65.0 in | Wt 206.0 lb

## 2013-02-12 DIAGNOSIS — M79605 Pain in left leg: Secondary | ICD-10-CM

## 2013-02-12 DIAGNOSIS — M79609 Pain in unspecified limb: Secondary | ICD-10-CM

## 2013-02-12 DIAGNOSIS — R079 Chest pain, unspecified: Secondary | ICD-10-CM

## 2013-02-12 LAB — D-DIMER, QUANTITATIVE: D-Dimer, Quant: 2.25 ug/mL-FEU — ABNORMAL HIGH (ref 0.00–0.48)

## 2013-02-12 LAB — BASIC METABOLIC PANEL
Glucose, Bld: 89 mg/dL (ref 70–99)
Potassium: 3.5 mEq/L (ref 3.5–5.3)
Sodium: 137 mEq/L (ref 135–145)

## 2013-02-12 MED ORDER — MELOXICAM 15 MG PO TABS: 15.0000 mg | ORAL_TABLET | Freq: Every day | ORAL | Status: DC

## 2013-02-12 NOTE — Progress Notes (Signed)
   Subjective:    Patient ID: Kaitlin [name], female    DOB: 08/12/1955, 57 y.o.   MRN: [id number]  HPIHere for left leg pain. Was seen in September for left leg pain. Took relafen. She describes the pain is been anterior aspect of the leg not in the calf no swelling with it there is tenderness at the area of the discomfort. It is causing her great distress. She states the pain is bad enough to notice it when she walks stands sits. Does not keep her up at night. She would like to get seen by a specialist for this  Yesterday at work pt stated she had chest tightness that lasted about 30 mins. Left arm and leg pain. This occurred yesterday at rest she states when she walks around she does not get any chest tightness pressure pain she was at work when she started noticing a burning in her left arm been a tingling then she felt some mild tightness in her chest broke out in sweat lasted 30 minutes then it went away she's felt fine since she had fairly good looking cholesterol panel earlier this year and have a normal cardiac workup 2 years ago a separate LVH but cardiologist back then recommended Myoview if her symptoms were to return   Knot on left knee. Painful the last few days. Minimal.  Burning pain on bottom of right foot for a few weeks. Minimal. PMH benign Family history benign   Review of Systems Patient denies cough wheeze vomiting diarrhea sweats chills fever    Objective:   Physical Exam Neck no masses eardrums normal throat is normal lungs are clear no crackles no rest for distress chest wall mild tenderness heart regular no murmurs extremities no edema skin warm dry Calf circumference on both legs is equal she has some tenderness in left anterior aspect of the left leg looks like she is starting to develop a possible varicose vein       Assessment & Plan:  Left leg pain-try Mobic. Also referral to orthopedics per discussion above  #2 chest discomfort mild costochondritis  anti-inflammatory should help referral back to cardiology they may want to do a Myoview we are checking troponin her EKG looks normal she was told if she starts having chest pressure tightness again with shortness of breath immediately go to the ER call 911.  #3 I do not feel the patient is having DVT in the left leg but we will do a d-dimer. If positive then we'll set up ultrasound  Patient has had elevated calcium 3 separate times she was concerned about this we will look further into it with PTH and calcium she may end up needing a 24-hour calcium excretion test along with phosphorus vitamin D await the results of PTH first

## 2013-02-13 ENCOUNTER — Other Ambulatory Visit: Payer: Self-pay

## 2013-02-13 ENCOUNTER — Ambulatory Visit (HOSPITAL_COMMUNITY)
Admission: RE | Admit: 2013-02-13 | Discharge: 2013-02-13 | Disposition: A | Discharge status: Home / Self Care | Payer: BC Managed Care – PPO | Source: Ambulatory Visit | Attending: Family Medicine | Admitting: Family Medicine

## 2013-02-13 DIAGNOSIS — M79605 Pain in left leg: Secondary | ICD-10-CM

## 2013-02-13 DIAGNOSIS — M79609 Pain in unspecified limb: Secondary | ICD-10-CM | POA: Insufficient documentation

## 2013-02-13 LAB — PTH, INTACT AND CALCIUM
Calcium: 10.4 mg/dL (ref 8.4–10.5)
PTH: 111.4 pg/mL — ABNORMAL HIGH (ref 14.0–72.0)

## 2013-02-13 IMAGING — US US EXTREM LOW VENOUS*L*
1 series · 14 of 24 positions shown · non-contrast
Comparison: Name

CLINICAL DATA: Left leg pain

EXAM:
Left LOWER EXTREMITY VENOUS DOPPLER ULTRASOUND
TECHNIQUE: Gray-scale sonography with graded compression, as well as color
Doppler and duplex ultrasound, were performed to evaluate the deep
venous system from the level of the common femoral vein through the
popliteal and proximal calf veins. Spectral Doppler was utilized to
evaluate flow at rest and with distal augmentation maneuvers.

[Series 1: us extrem low venous*left* · 0.08mm/px · 14 of 28 slices shown]
[im 1/28]
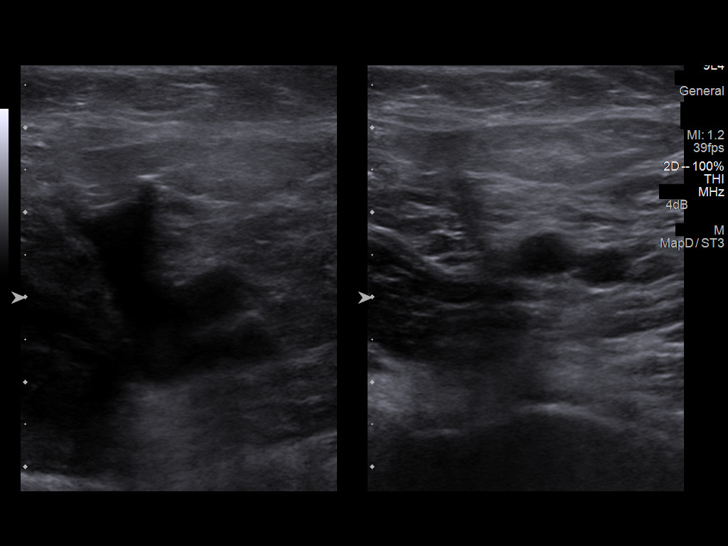
[im 3/28]
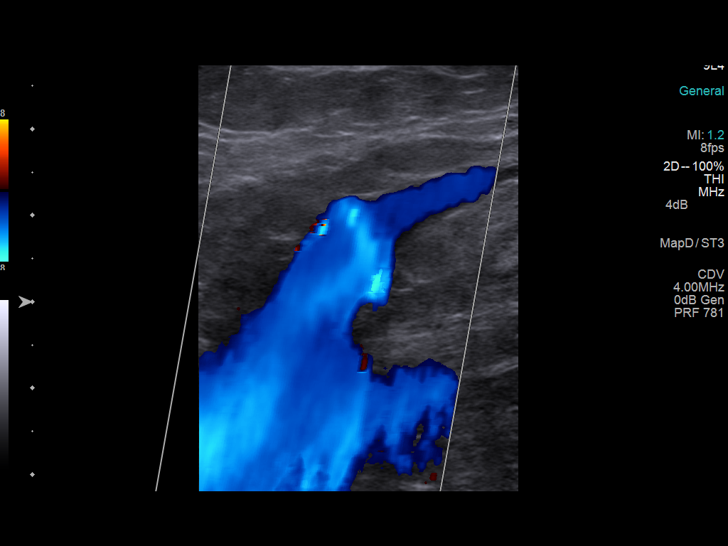
[im 5/28]
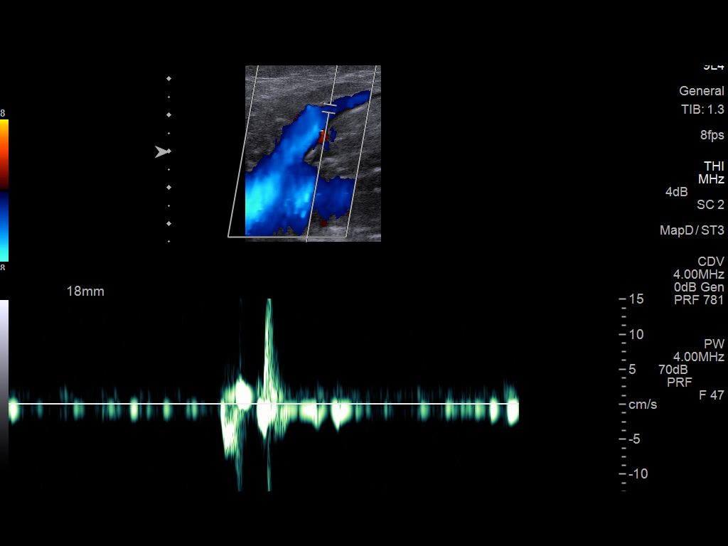
[im 8/28]
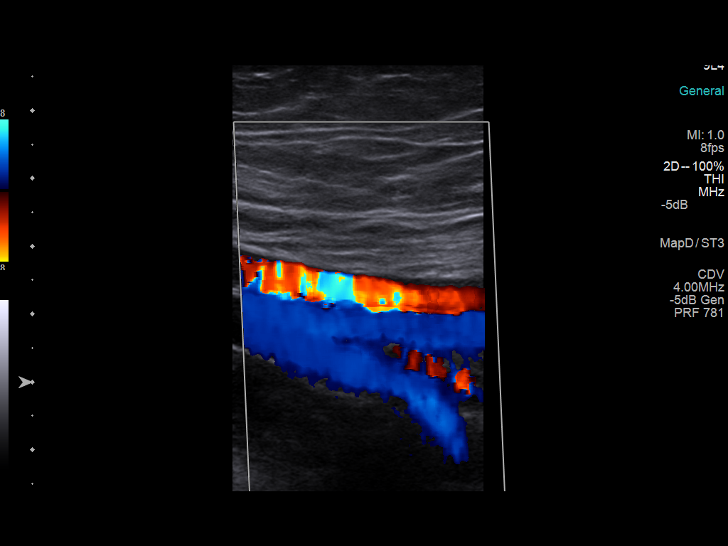
[im 9/28]
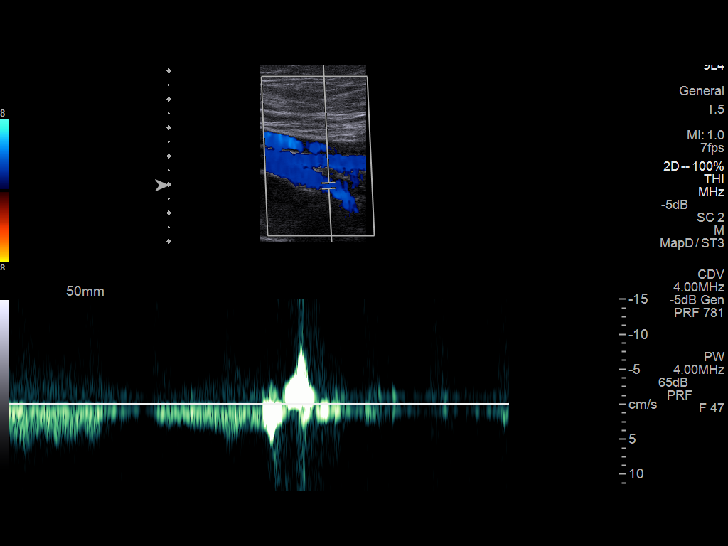
[im 11/28]
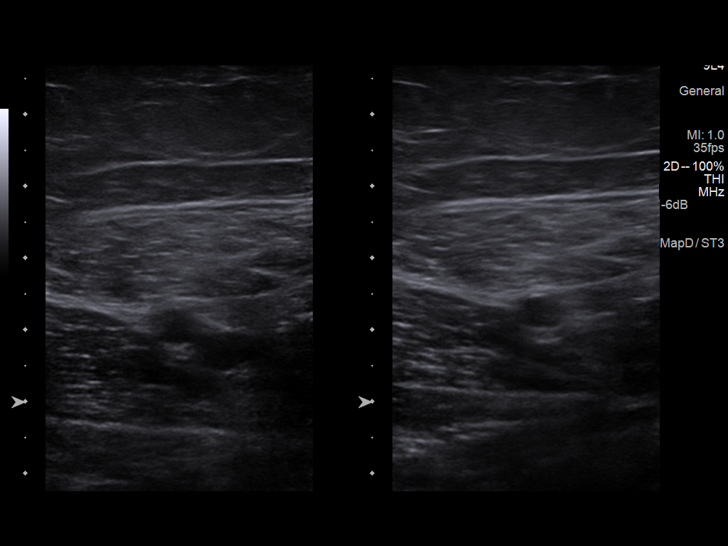
[im 13/28]
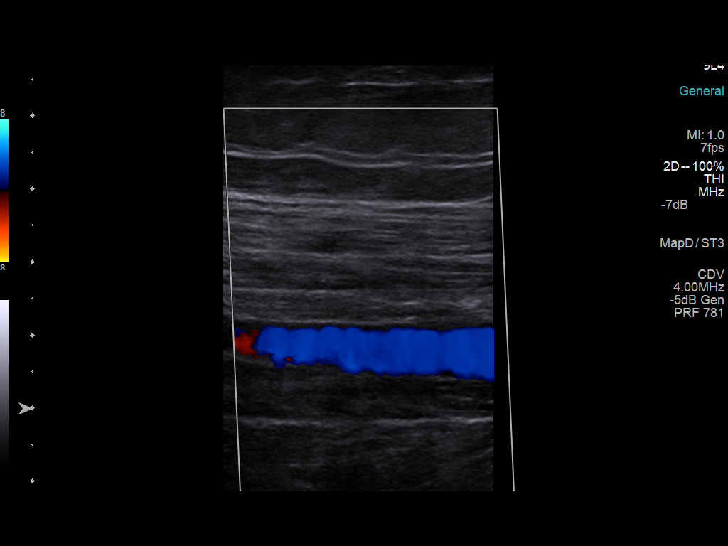
[im 15/28]
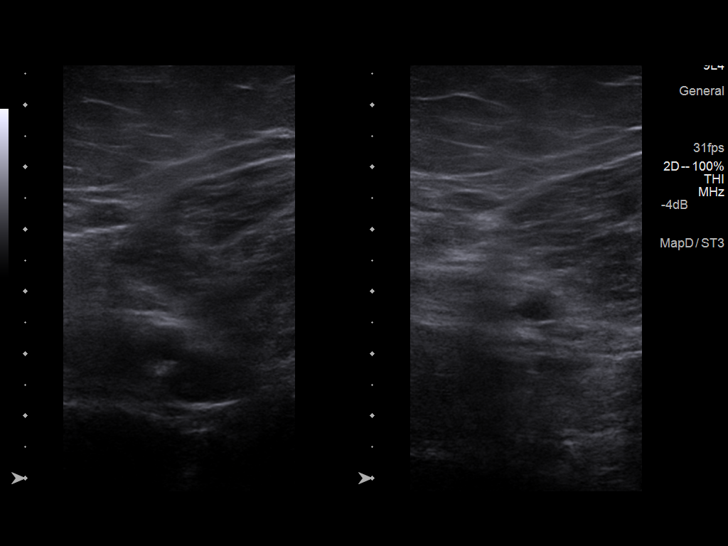
[im 17/28]
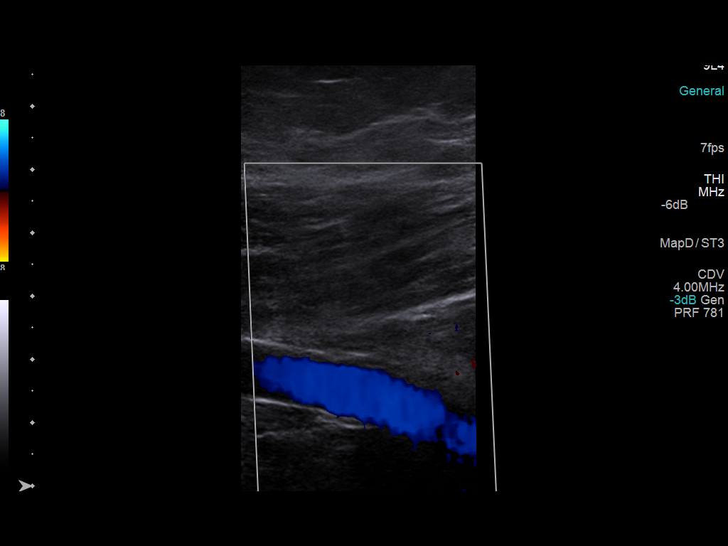
[im 19/28]
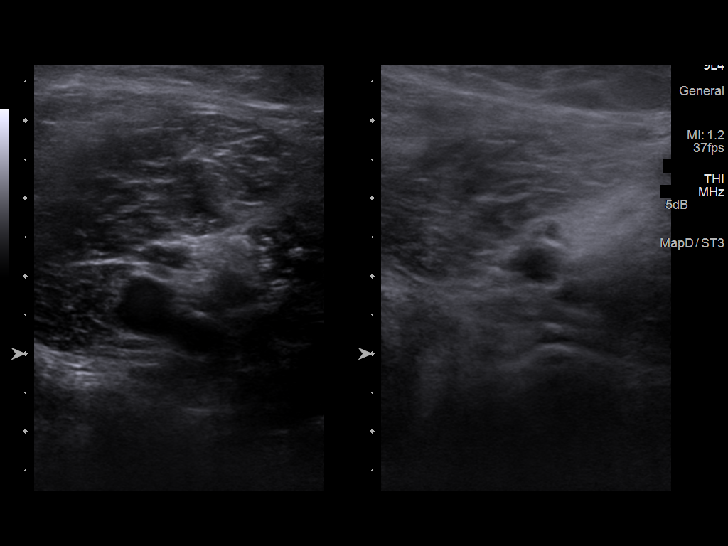
[im 22/28]
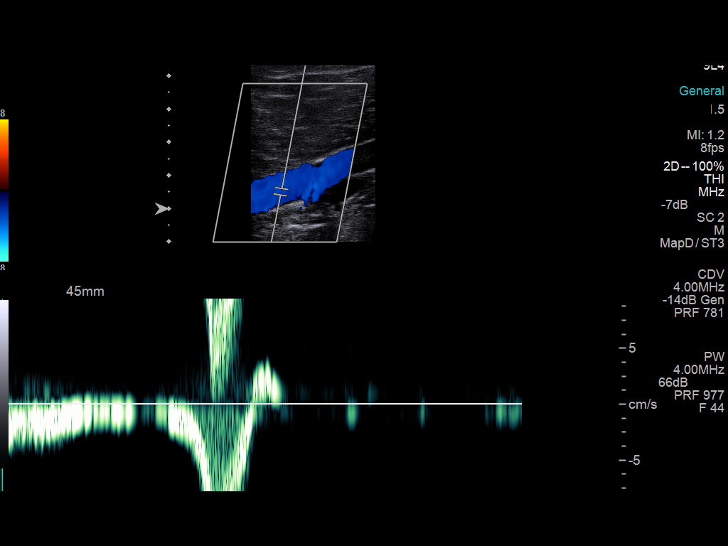
[im 23/28]
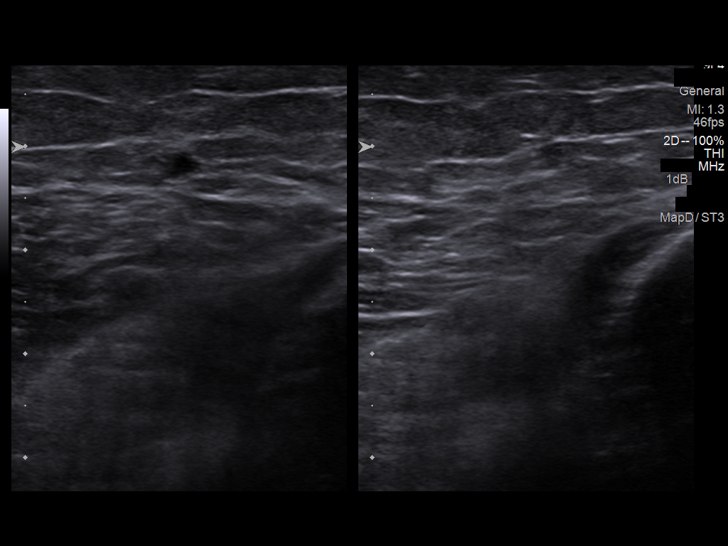
[im 25/28]
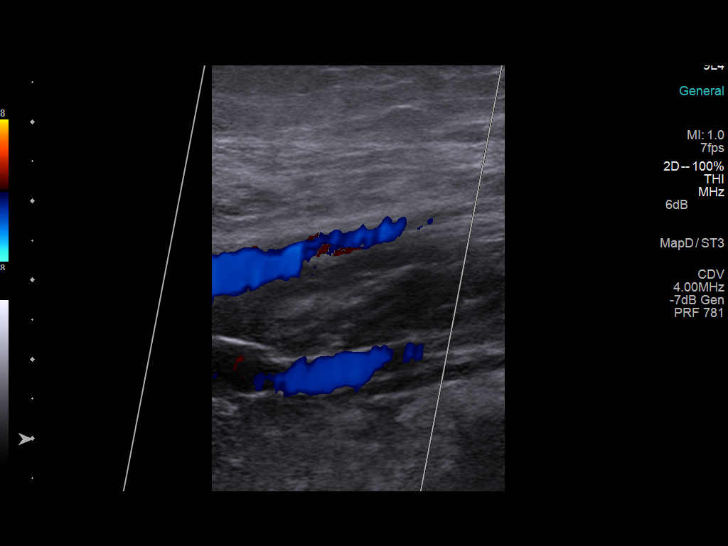
[im 28/28]
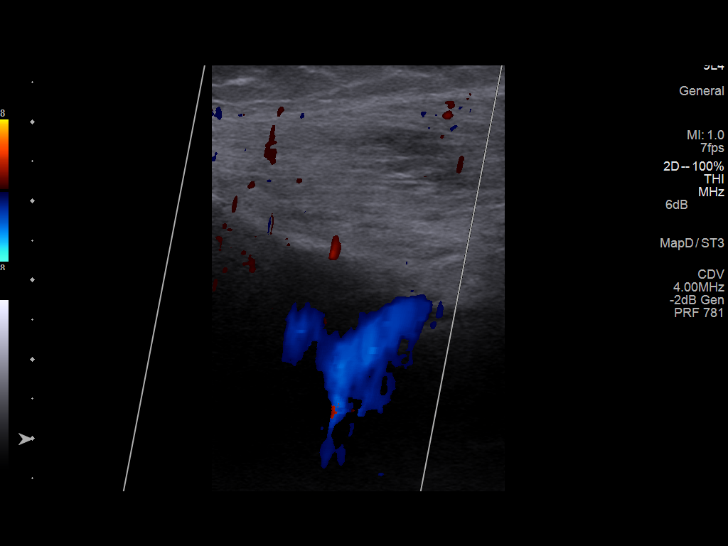

[14 of 24 positions shown; findings below may reference images not displayed]

FINDINGS: Thrombus within deep veins:  None visualized.

Compressibility of deep veins:  Normal.

Duplex waveform respiratory phasicity:  Normal.

Duplex waveform response to augmentation:  Normal.

Venous reflux:  None visualized.

Other findings:  None visualized.
IMPRESSION: Negative for DVT.

## 2013-02-13 NOTE — Progress Notes (Signed)
Patient notified and verbalized understanding. I transferred up front to make a f/u appt with Dr. Brett Canales next week.

## 2013-02-16 ENCOUNTER — Encounter: Payer: Self-pay | Admitting: Adult Health

## 2013-02-16 ENCOUNTER — Ambulatory Visit (INDEPENDENT_AMBULATORY_CARE_PROVIDER_SITE_OTHER): Payer: BC Managed Care – PPO | Admitting: Adult Health

## 2013-02-16 VITALS — BP 118/78 | HR 81 | Ht 65.0 in | Wt 205.0 lb

## 2013-02-16 DIAGNOSIS — R079 Chest pain, unspecified: Secondary | ICD-10-CM

## 2013-02-16 DIAGNOSIS — I10 Essential (primary) hypertension: Secondary | ICD-10-CM

## 2013-02-16 NOTE — Progress Notes (Signed)
HPI: Mrs. Reveron is a 57 year old patient of Dr. Diona Browner were following for ongoing assessment and management of recurrent chest pain. The patient was last seen by Dr. Diona Browner in 2012, she had a normal echocardiogram revealing mildly calcified aortic annulus in HOCM She was also being treated for hypertension .    She was seen by Dr.Luking  in his office last week with recurrent complaints of abdominal pain and chest pain. She is being referred to cardiology for ongoing evaluation of chest pain. States she was at work when she began have pain in her chest which she described as some soreness, left arm throbbing with associated diaphoresis. She felt her heart racing. She was at a work related birthday party when this happened. She went to the nurse on site who gave her Tylenol and had no further pain. As result of this she saw Dr. Gerda Diss who referred her to cardiology. She has had no further pain since that time. He continues medically compliant.  Allergies  Allergen Reactions  . Asa [Aspirin]   . Augmentin [Amoxicillin-Pot Clavulanate]   . Levaquin [Levofloxacin In D5w]     chestpain  . Rocephin [Ceftriaxone Sodium In Dextrose]   . Valtrex [Valacyclovir Hcl]   . Zoloft [Sertraline Hcl]     Current Outpatient Prescriptions  Medication Sig Dispense Refill  . ALPRAZolam (XANAX) 1 MG tablet TAKE 1 TABLET BY MOUTH 3 TIMES A DAY AS NEEDED  90 tablet  2  . enalapril (VASOTEC) 20 MG tablet TAKE 1 TABLET AT BEDTIME  30 tablet  5  . esomeprazole (NEXIUM) 40 MG capsule Take 1 capsule (40 mg total) by mouth daily.  30 capsule  11  . indapamide (LOZOL) 1.25 MG tablet TAKE 1 TABLET BY MOUTH IN THE MORNING  30 tablet  1  . levothyroxine (SYNTHROID, LEVOTHROID) 50 MCG tablet TAKE 1 TABLET EVERY DAY  30 tablet  5  . lovastatin (MEVACOR) 40 MG tablet TAKE 1 TABLET AT BEDTIME  30 tablet  4  . verapamil (CALAN-SR) 240 MG CR tablet TAKE 1 TABLET EVERY DAY  30 tablet  1  . meloxicam (MOBIC) 15 MG tablet  Take 1 tablet (15 mg total) by mouth daily.  30 tablet  2   No current facility-administered medications for this visit.    Past Medical History  Diagnosis Date  . BMI 30.0-30.9,adult 2008 182 LBS    2009 194 LBS  . Helicobacter pylori gastritis AUG 2008 PREVPAC: nausea-->HELIDAC: GI UPSET    HB 14.1 NL HFP H. PYLORI STOOL Ag NEG  . Colon polyp APR 2008  . Anxiety   . Essential hypertension, benign   . Hypothyroidism   . Mixed hyperlipidemia   . Irritable bowel syndrome   . Tubulovillous adenoma 08/07/10    Colonoscopy w/ Dr Darrick Penna w/ 7 polyps removed-bx showed tubular adenomas.  Largest 1cm-hepatic flexure polyp->TA.    Marland Kitchen Panic attacks   . Reflux   . Kidney stones   . Depression   . Folliculitis 09/26/2012  . Mixed stress and urge urinary incontinence 09/26/2012    Past Surgical History  Procedure Laterality Date  . Colonoscopy  APR 2008    AC/[place] SIMPLE ADENOMA  . Upper gastrointestinal endoscopy  AUG 2008 PAIN/NAUSEA    H. PYLORI treated  . Cholecystectomy    . Mass excision  02/28/2011    Procedure: EXCISION MASS;  Surgeon: Fabio Bering, MD;  Location: [facility];  Service: General;  Laterality: Right;  soft tissue  mass  . Colonoscopy  08/07/2010    Dr. Lorane Gell adenomas    ROS: Review of systems complete and found to be negative unless listed above  PHYSICAL EXAM BP 118/78  Pulse 81  Ht 5\' 5"  (1.651 m)  Wt 205 lb (92.987 kg)  BMI 34.11 kg/m2  General: Well developed, well nourished, in no acute distress, obese. Head: Eyes PERRLA, No xanthomas.   Normal cephalic and atramatic  Lungs: Clear bilaterally to auscultation and percussion. Heart: HRRR S1 S2, without MRG.  Pulses are 2+ & equal.            No carotid bruit. No JVD.  No abdominal bruits. No femoral bruits. Abdomen: Bowel sounds are positive, abdomen soft and non-tender without masses or                  Hernia's noted. Msk:  Back normal, normal gait. Normal strength and tone for age. Extremities: No  clubbing, cyanosis or edema.  DP +1 Neuro: Alert and oriented X 3. Psych:  Good affect, responds appropriately    ASSESSMENT AND PLAN

## 2013-02-16 NOTE — Assessment & Plan Note (Signed)
Blood pressure is well-controlled currently. I will not make any changes in her current medication regimen.

## 2013-02-16 NOTE — Patient Instructions (Signed)
Your physician recommends that you schedule a follow-up appointment in:  Post stress test with Dr Diona Browner.  Your physician recommends that you continue on your current medications as directed. Please refer to the Current Medication list given to you today.

## 2013-02-16 NOTE — Progress Notes (Deleted)
Name: Kaitlin [name]    DOB: 1955/09/27  Age: 57 y.o.  MR#: [id number]       PCP:  Harlow Asa, MD      Insurance: Payor: BLUE CROSS BLUE [employer]IELD / Plan: BCBS PPO OUT OF STATE / Product Type: *No Product type* /   CC:    Chief Complaint  Patient presents with  . Chest Pain    VS Filed Vitals:   02/16/13 1421  BP: 118/78  Pulse: 81  Height: 5\' 5"  (1.651 m)  Weight: 205 lb (92.987 kg)    Weights Current Weight  02/16/13 205 lb (92.987 kg)  02/12/13 206 lb (93.441 kg)  11/10/12 191 lb 3.2 oz (86.728 kg)    Blood Pressure  BP Readings from Last 3 Encounters:  02/16/13 118/78  02/12/13 132/90  11/10/12 124/90     Admit date:  (Not on file) Last encounter with RMR:  Visit date not found   Allergy Asa; Augmentin; Levaquin; Rocephin; Valtrex; and Zoloft  Current Outpatient Prescriptions  Medication Sig Dispense Refill  . ALPRAZolam (XANAX) 1 MG tablet TAKE 1 TABLET BY MOUTH 3 TIMES A DAY AS NEEDED  90 tablet  2  . enalapril (VASOTEC) 20 MG tablet TAKE 1 TABLET AT BEDTIME  30 tablet  5  . esomeprazole (NEXIUM) 40 MG capsule Take 1 capsule (40 mg total) by mouth daily.  30 capsule  11  . indapamide (LOZOL) 1.25 MG tablet TAKE 1 TABLET BY MOUTH IN THE MORNING  30 tablet  1  . levothyroxine (SYNTHROID, LEVOTHROID) 50 MCG tablet TAKE 1 TABLET EVERY DAY  30 tablet  5  . lovastatin (MEVACOR) 40 MG tablet TAKE 1 TABLET AT BEDTIME  30 tablet  4  . verapamil (CALAN-SR) 240 MG CR tablet TAKE 1 TABLET EVERY DAY  30 tablet  1  . meloxicam (MOBIC) 15 MG tablet Take 1 tablet (15 mg total) by mouth daily.  30 tablet  2   No current facility-administered medications for this visit.    Discontinued Meds:   There are no discontinued medications.  Patient Active Problem List   Diagnosis Date Noted  . Folliculitis 09/26/2012  . Mixed stress and urge urinary incontinence 09/26/2012  . Subserous leiomyoma of uterus 09/01/2012  . Unspecified hypothyroidism 06/12/2012  . Venous stasis  06/12/2012  . Essential hypertension, benign 12/11/2010  . Abnormal ECG 12/11/2010  . Chest pain 12/11/2010  . IBS (irritable bowel syndrome) 07/05/2010  . Hx of adenomatous colonic polyps 07/05/2010    LABS    Component Value Date/Time   NA 137 02/12/2013 1630   NA 139 06/12/2012 1657   NA 138 02/23/2011 1350   K 3.5 02/12/2013 1630   K 4.2 06/12/2012 1657   K 3.6 02/23/2011 1350   CL 100 02/12/2013 1630   CL 104 06/12/2012 1657   CL 102 02/23/2011 1350   CO2 26 02/12/2013 1630   CO2 27 06/12/2012 1657   CO2 27 02/23/2011 1350   GLUCOSE 89 02/12/2013 1630   GLUCOSE 97 06/12/2012 1657   GLUCOSE 109* 02/23/2011 1350   BUN 10 02/12/2013 1630   BUN 14 06/12/2012 1657   BUN 15 02/23/2011 1350   CREATININE 0.77 02/12/2013 1630   CREATININE 0.64 06/12/2012 1657   CREATININE 0.72 02/23/2011 1350   CREATININE 0.66 08/10/2010 1252   CREATININE 0.66 03/06/2010 1124   CALCIUM 10.5 02/12/2013 1630   CALCIUM 10.4 02/12/2013 1630   CALCIUM 10.6* 06/12/2012 1657   CALCIUM 11.0* 02/23/2011  1350   GFRNONAA >90 02/23/2011 1350   GFRNONAA >60 08/10/2010 1252   GFRNONAA >60 03/06/2010 1124   GFRAA >90 02/23/2011 1350   GFRAA  Value: >60        The eGFR has been calculated using the MDRD equation. This calculation has not been validated in all clinical situations. eGFR's persistently <60 mL/min signify possible Chronic Kidney Disease. 08/10/2010 1252   GFRAA  Value: >60        The eGFR has been calculated using the MDRD equation. This calculation has not been validated in all clinical situations. eGFR's persistently <60 mL/min signify possible Chronic Kidney Disease. 03/06/2010 1124   CMP     Component Value Date/Time   NA 137 02/12/2013 1630   K 3.5 02/12/2013 1630   CL 100 02/12/2013 1630   CO2 26 02/12/2013 1630   GLUCOSE 89 02/12/2013 1630   BUN 10 02/12/2013 1630   CREATININE 0.77 02/12/2013 1630   CREATININE 0.72 02/23/2011 1350   CALCIUM 10.5 02/12/2013 1630   CALCIUM 10.4 02/12/2013 1630    PROT 7.4 06/12/2012 1657   ALBUMIN 4.5 06/12/2012 1657   AST 27 06/12/2012 1657   ALT 30 06/12/2012 1657   ALKPHOS 58 06/12/2012 1657   BILITOT 0.5 06/12/2012 1657   GFRNONAA >90 02/23/2011 1350   GFRAA >90 02/23/2011 1350       Component Value Date/Time   WBC 7.5 02/23/2011 1350   WBC 7.6 08/10/2010 1252   WBC 6.8 03/06/2010 1124   HGB 14.0 02/23/2011 1350   HGB 15.0 08/10/2010 1252   HGB 15.2* 03/06/2010 1124   HCT 42.2 02/23/2011 1350   HCT 44.8 08/10/2010 1252   HCT 43.7 03/06/2010 1124   MCV 86.3 02/23/2011 1350   MCV 85.0 08/10/2010 1252   MCV 83.6 03/06/2010 1124    Lipid Panel     Component Value Date/Time   CHOL 184 06/12/2012 1657   TRIG 109 06/12/2012 1657   HDL 49 06/12/2012 1657   CHOLHDL 3.8 06/12/2012 1657   VLDL 22 06/12/2012 1657   LDLCALC 113* 06/12/2012 1657    ABG No results found for this basename: phart, pco2, pco2art, po2, po2art, hco3, tco2, acidbasedef, o2sat     Lab Results  Component Value Date   TSH 3.307 06/12/2012   BNP (last 3 results) No results found for this basename: PROBNP,  in the last 8760 hours Cardiac Panel (last 3 results) No results found for this basename: CKTOTAL, CKMB, TROPONINI, RELINDX,  in the last 72 hours  Iron/TIBC/Ferritin No results found for this basename: iron, tibc, ferritin     EKG Orders placed in visit on 11/10/12  . EKG 12-LEAD     Prior Assessment and Plan Problem List as of 02/16/2013   IBS (irritable bowel syndrome)   Last Assessment & Plan   07/05/2010 Office Visit Written 07/05/2010  4:26 PM by West Bali, MD     Sx stable. Use MLX prn. OPV in 12 mos.    Hx of adenomatous colonic polyps   Last Assessment & Plan   07/05/2010 Office Visit Written 07/05/2010  4:27 PM by West Bali, MD     Hx: adenomatous polyps removed. Last TCS 2008 TCS June 2012-OSMOPREP. If no polyps removed then consider a 10 year interval.    Essential hypertension, benign   Last Assessment & Plan   12/28/2010 Office Visit Written  12/28/2010 10:35 AM by Jonelle Sidle, MD     Medical therapy is actually  fairly reasonable in light of her LVH. We also discussed importance of diet and sodium restriction, exercise. I asked her to continue regular followup with Dr. Gerda Diss.    Abnormal ECG   Last Assessment & Plan   12/11/2010 Office Visit Written 12/11/2010  3:36 PM by Jonelle Sidle, MD     Tracing reviewed showing increased voltage, possibly related to hypertension over the years. Does report some family members with "heart failure" although no definite familial history. Tracing not typical for hypertrophic cardiomyopathy. A 2-D echocardiogram is planned for more objective cardiac structural assessment. Followup arranged to review.     Chest pain   Last Assessment & Plan   12/28/2010 Office Visit Written 12/28/2010 10:36 AM by Jonelle Sidle, MD     Atypical, and noted in setting of anxiety and stress, not specifically with exertion. I asked her to continue to be observant for any change or progression, and follow up with Dr. Gerda Diss. If her symptoms progress, a followup Myoview could be considered.    Unspecified hypothyroidism   Venous stasis   Subserous leiomyoma of uterus   Folliculitis   Mixed stress and urge urinary incontinence       Imaging: US Venous Img Lower Unilateral Left  02/13/2013   CLINICAL DATA:  Left leg pain  EXAM: Left LOWER EXTREMITY VENOUS DOPPLER ULTRASOUND  TECHNIQUE: Gray-scale sonography with graded compression, as well as color Doppler and duplex ultrasound, were performed to evaluate the deep venous system from the level of the common femoral vein through the popliteal and proximal calf veins. Spectral Doppler was utilized to evaluate flow at rest and with distal augmentation maneuvers.  COMPARISON:  Name  FINDINGS: Thrombus within deep veins:  None visualized.  Compressibility of deep veins:  Normal.  Duplex waveform respiratory phasicity:  Normal.  Duplex waveform response to  augmentation:  Normal.  Venous reflux:  None visualized.  Other findings:  None visualized.  IMPRESSION: Negative for DVT.   Electronically Signed   By: Ruel Favors M.D.   On: 02/13/2013 10:08

## 2013-02-16 NOTE — Assessment & Plan Note (Addendum)
Pain is nonspecific. Does not appear to be cardiac in etiology at this time as it occurred once and was relieved with Tylenol. The patient's most recent echocardiogram was done in 2012 revealing mild septal hypertrophy and normal LV function of 60-65% with no wall motion abnormalities. I suggested that the patient can proceed with a stress to for further evaluation of chest discomfort, although a nuclear study would not be recommended I would choose a GXT. Note, last nuclear study was in 2011 was found to be normal.    The patient does now wish to walk on a treadmill and she is having trouble with her left hip and leg. She wishes to discuss the need to proceed with a stress test with Dr. Gerda Diss. I am happy to set up a stress test for her which will more likely be a Lexiscan stress test as she is unable or willing to walk on a treadmill. Wishes to discuss it with PCP first.

## 2013-02-18 ENCOUNTER — Ambulatory Visit: Payer: BC Managed Care – PPO | Admitting: Family Medicine

## 2013-02-19 ENCOUNTER — Encounter: Payer: Self-pay | Admitting: Family Medicine

## 2013-02-19 ENCOUNTER — Ambulatory Visit (INDEPENDENT_AMBULATORY_CARE_PROVIDER_SITE_OTHER): Payer: BC Managed Care – PPO | Admitting: Family Medicine

## 2013-02-19 VITALS — Ht 65.0 in | Wt 206.0 lb

## 2013-02-19 DIAGNOSIS — R079 Chest pain, unspecified: Secondary | ICD-10-CM

## 2013-02-19 DIAGNOSIS — I10 Essential (primary) hypertension: Secondary | ICD-10-CM

## 2013-02-19 NOTE — Progress Notes (Signed)
   Subjective:    Patient ID: Kaitlin [name], female    DOB: 12-28-1955, 57 y.o.   MRN: [id number]  HPI Patient arrives for a follow up on chest pain and discuss recent lab results. Patient seen 02/12/13. Patient states she saw cardiology on Monday and wants to discuss there recommendations.  The cardiology nurse practitioner in her notes was not overly enthusiastic about her need for further workup. She did note that they could do a Myoview. She notes that she doubted pain was cardiac. Patient has had no more pain. Negative Myoview in the past. Patient is prone to puzzling symptoms which strike suddenly and improve rather quickly also.  Reports no further chest discomfort or left arm pain. Still due to see orthopedist regarding leg pain.  No exertional chest pain shortness of breath or nausea.   Review of Systems Review systems otherwise negative    Objective:   Physical Exam Alert no apparent distress. Lungs clear. Heart rare rhythm. H&T normal. Shoulders good range of motion.       Assessment & Plan:  Impression 1 transient chest/left arm discomfort. Atypical in nature. Patient not thrilled about further cardiac tests and I think at this point we can hold off on them for reasons discussed #2 orthopedic concerns her weight referral plan the diet exercise discussed in encourage. Symptomatic care discussed. Warning signs discussed. WSL

## 2013-02-19 NOTE — Patient Instructions (Signed)
Parathyroid Hormone This is a test to determine whether PTH levels are responding normally to changes in blood calcium levels. It also helps to distinguish the cause of calcium imbalances, and to evaluate parathyroid function. When calcium blood levels are higher or lower than normal, and when your caregiver may want to determine how well your parathyroid glands are working. Parathyroid hormone (PTH) helps the body maintain stable levels of calcium in the blood. It is part of a 'feedback loop' that includes calcium, PTH, vitamin D, and, to some extent, phosphate and magnesium. Conditions and diseases that disrupt this feedback loop can cause inappropriate elevations or decreases in calcium and PTH levels and lead to symptoms of hypercalcemia (raised blood levels of calcium) or hypocalcemia (low blood levels of calcium).  PTH is produced by four parathyroid glands that are located in the neck beside the thyroid gland. Normally, these glands secrete PTH into the bloodstream in response to low blood calcium levels. Parathyroid hormone then works in three ways to help raise blood calcium levels back to normal. It takes calcium from the body's bone, stimulates the activation of vitamin D in the kidney (which in turn increases the absorption of calcium from the intestines), and suppresses the excretion of calcium in the urine (while encouraging excretion of phosphate). As calcium levels begin to increase in the blood, PTH normally decreases. PREPARATION FOR TEST You should have nothing to eat or drink except for water after midnight on the day of the test or as directed by your caregiver. A blood sample is obtained by inserting a needle into a vein in the arm. NORMAL FINDINGS Conventional Normal  PTH intact (whole)  Assay includes intact PTH  Values (pg/mL)10-65  SI Units (ng/L)10-65  PTH N-terminalN-terminal  Values (pg/mL) 8-24  SI Units (ng/L)8-24  PTH C-terminal  Assay Includes  C-terminal  Values (pg/mL) 50-330  SI Units (ng/L) 50-330  Intact PTH  Midmolecule Ranges for normal findings may vary among different laboratories and hospitals. You should always check with your doctor after having lab work or other tests done to discuss the meaning of your test results and whether your values are considered within normal limits. MEANING OF TEST  Your caregiver will go over the test results with you and discuss the importance and meaning of your results, as well as treatment options and the need for additional tests if necessary. OBTAINING THE TEST RESULTS  It is your responsibility to obtain your test results. Ask the lab or department performing the test when and how you will get your results. Document Released: 03/24/2004 Document Revised: 05/14/2011 Document Reviewed: 02/01/2008 [employer] Patient Information 2014 [employer].  

## 2013-02-21 ENCOUNTER — Other Ambulatory Visit: Payer: Self-pay | Admitting: Family Medicine

## 2013-02-23 ENCOUNTER — Ambulatory Visit (INDEPENDENT_AMBULATORY_CARE_PROVIDER_SITE_OTHER): Payer: BC Managed Care – PPO | Admitting: Family Medicine

## 2013-02-23 ENCOUNTER — Encounter: Payer: Self-pay | Admitting: Family Medicine

## 2013-02-23 VITALS — BP 134/80 | Temp 103.2°F | Ht 65.0 in | Wt 206.0 lb

## 2013-02-23 DIAGNOSIS — J111 Influenza due to unidentified influenza virus with other respiratory manifestations: Secondary | ICD-10-CM

## 2013-02-23 MED ORDER — OSELTAMIVIR PHOSPHATE 75 MG PO CAPS: 75.0000 mg | ORAL_CAPSULE | Freq: Two times a day (BID) | ORAL | Status: DC

## 2013-02-23 NOTE — Progress Notes (Signed)
   Subjective:    Patient ID: Kaitlin [name], female    DOB: 11-Jul-1955, 57 y.o.   MRN: [id number]  Cough This is a new problem. The current episode started in the past 7 days. Associated symptoms include a fever, headaches, myalgias, postnasal drip and wheezing. Associated symptoms comments: dizziness.   Patient feels poorly. Rather sudden onset. Severe headaches at times. Very significant fever. Cough intermittently. Minimally productive. No energy no appetite achy all over.   Review of Systems  Constitutional: Positive for fever.  HENT: Positive for postnasal drip.   Respiratory: Positive for cough and wheezing.   Musculoskeletal: Positive for myalgias.  Neurological: Positive for headaches.       Objective:   Physical Exam  Vital signs stable temperature 103 alert moderate malaise. H&T mom his congestion. Normal neck supple lungs no tachypnea no wheezes or crackles heart regular in rhythm.      Assessment & Plan:  Impression 1 influenza plan Tamiflu twice a day 5 days. Symptomatic care discussed. Warning signs discussed. WSL

## 2013-03-13 ENCOUNTER — Encounter: Payer: Self-pay | Admitting: Family Medicine

## 2013-03-20 ENCOUNTER — Telehealth: Payer: Self-pay | Admitting: Family Medicine

## 2013-03-20 ENCOUNTER — Emergency Department (HOSPITAL_COMMUNITY): Payer: BC Managed Care – PPO

## 2013-03-20 ENCOUNTER — Encounter (HOSPITAL_COMMUNITY): Payer: Self-pay | Admitting: Emergency Medicine

## 2013-03-20 ENCOUNTER — Emergency Department (HOSPITAL_COMMUNITY)
Admission: EM | Admit: 2013-03-20 | Discharge: 2013-03-20 | Disposition: A | Discharge status: Home / Self Care | Payer: BC Managed Care – PPO | Attending: Emergency Medicine | Admitting: Emergency Medicine

## 2013-03-20 DIAGNOSIS — I10 Essential (primary) hypertension: Secondary | ICD-10-CM | POA: Insufficient documentation

## 2013-03-20 DIAGNOSIS — F329 Major depressive disorder, single episode, unspecified: Secondary | ICD-10-CM | POA: Insufficient documentation

## 2013-03-20 DIAGNOSIS — W19XXXA Unspecified fall, initial encounter: Secondary | ICD-10-CM

## 2013-03-20 DIAGNOSIS — Y929 Unspecified place or not applicable: Secondary | ICD-10-CM | POA: Insufficient documentation

## 2013-03-20 DIAGNOSIS — E782 Mixed hyperlipidemia: Secondary | ICD-10-CM | POA: Insufficient documentation

## 2013-03-20 DIAGNOSIS — Z79899 Other long term (current) drug therapy: Secondary | ICD-10-CM | POA: Insufficient documentation

## 2013-03-20 DIAGNOSIS — S300XXA Contusion of lower back and pelvis, initial encounter: Secondary | ICD-10-CM | POA: Insufficient documentation

## 2013-03-20 DIAGNOSIS — S0003XA Contusion of scalp, initial encounter: Secondary | ICD-10-CM | POA: Insufficient documentation

## 2013-03-20 DIAGNOSIS — F3289 Other specified depressive episodes: Secondary | ICD-10-CM | POA: Insufficient documentation

## 2013-03-20 DIAGNOSIS — W1809XA Striking against other object with subsequent fall, initial encounter: Secondary | ICD-10-CM | POA: Insufficient documentation

## 2013-03-20 DIAGNOSIS — S1093XA Contusion of unspecified part of neck, initial encounter: Secondary | ICD-10-CM

## 2013-03-20 DIAGNOSIS — Z860100 Personal history of colon polyps, unspecified: Secondary | ICD-10-CM | POA: Insufficient documentation

## 2013-03-20 DIAGNOSIS — S199XXA Unspecified injury of neck, initial encounter: Secondary | ICD-10-CM

## 2013-03-20 DIAGNOSIS — Z8619 Personal history of other infectious and parasitic diseases: Secondary | ICD-10-CM | POA: Insufficient documentation

## 2013-03-20 DIAGNOSIS — K219 Gastro-esophageal reflux disease without esophagitis: Secondary | ICD-10-CM | POA: Insufficient documentation

## 2013-03-20 DIAGNOSIS — Z87442 Personal history of urinary calculi: Secondary | ICD-10-CM | POA: Insufficient documentation

## 2013-03-20 DIAGNOSIS — E039 Hypothyroidism, unspecified: Secondary | ICD-10-CM | POA: Insufficient documentation

## 2013-03-20 DIAGNOSIS — S0083XA Contusion of other part of head, initial encounter: Secondary | ICD-10-CM

## 2013-03-20 DIAGNOSIS — S0993XA Unspecified injury of face, initial encounter: Secondary | ICD-10-CM | POA: Insufficient documentation

## 2013-03-20 DIAGNOSIS — Z872 Personal history of diseases of the skin and subcutaneous tissue: Secondary | ICD-10-CM | POA: Insufficient documentation

## 2013-03-20 DIAGNOSIS — Y939 Activity, unspecified: Secondary | ICD-10-CM | POA: Insufficient documentation

## 2013-03-20 DIAGNOSIS — S0990XA Unspecified injury of head, initial encounter: Secondary | ICD-10-CM | POA: Insufficient documentation

## 2013-03-20 DIAGNOSIS — IMO0002 Reserved for concepts with insufficient information to code with codable children: Secondary | ICD-10-CM | POA: Insufficient documentation

## 2013-03-20 DIAGNOSIS — Z791 Long term (current) use of non-steroidal anti-inflammatories (NSAID): Secondary | ICD-10-CM | POA: Insufficient documentation

## 2013-03-20 DIAGNOSIS — F410 Panic disorder [episodic paroxysmal anxiety] without agoraphobia: Secondary | ICD-10-CM | POA: Insufficient documentation

## 2013-03-20 DIAGNOSIS — W108XXA Fall (on) (from) other stairs and steps, initial encounter: Secondary | ICD-10-CM | POA: Insufficient documentation

## 2013-03-20 IMAGING — CT CT HEAD W/O CM
3 of 6 series · 12 of 47 positions shown, 14 images · non-contrast
Comparison: Cervical spine radiographs performed [DATE], and CT
of the head performed [DATE]

CLINICAL DATA: Status post fall; headache. Syncope. Bilateral neck
stiffness.

EXAM:
CT HEAD WITHOUT CONTRAST
CT CERVICAL SPINE WITHOUT CONTRAST
TECHNIQUE: Multidetector CT imaging of the head and cervical spine was
performed following the standard protocol without intravenous
contrast. Multiplanar CT image reconstructions of the cervical spine
were also generated.

[Series 3: headseq 2.4 h60s · axial · 0.47mm/px · z∈[+160,+280]mm · 6 of 72 slices shown, 8 images]
[im 11/72  brain]
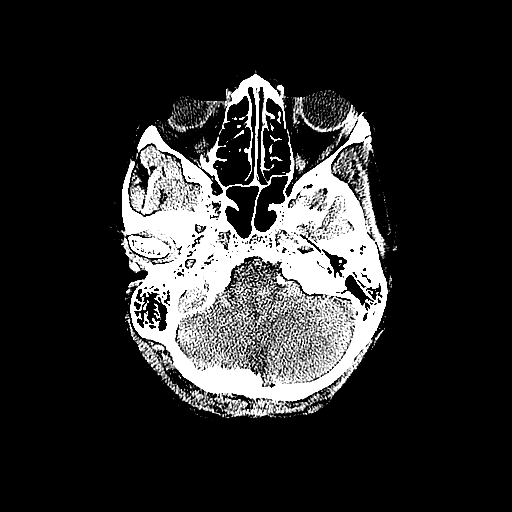
[im 11/72  bone]
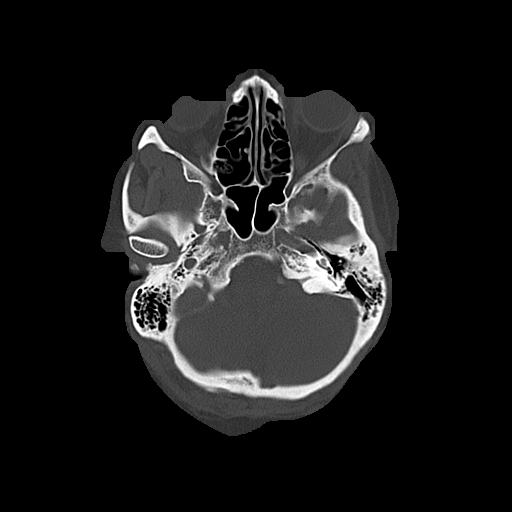
[im 21/72  brain]
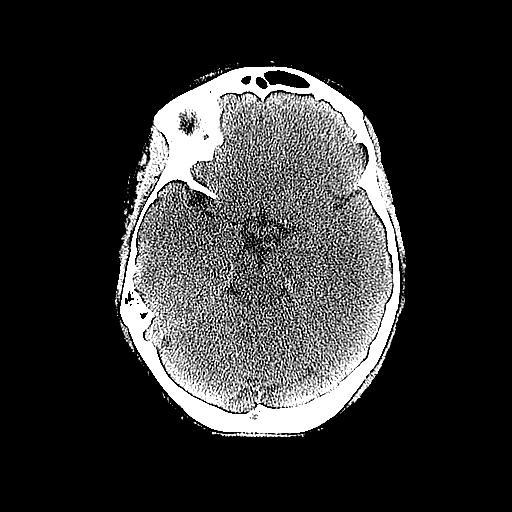
[im 31/72  brain]
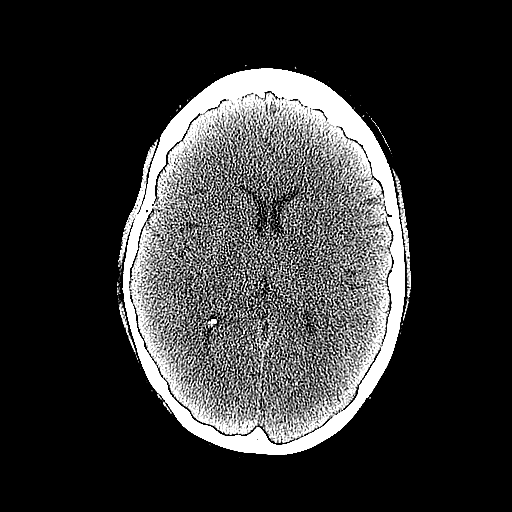
[im 41/72  brain]
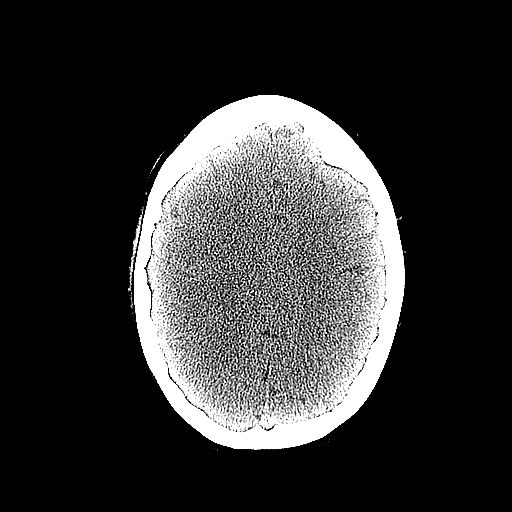
[im 51/72  brain]
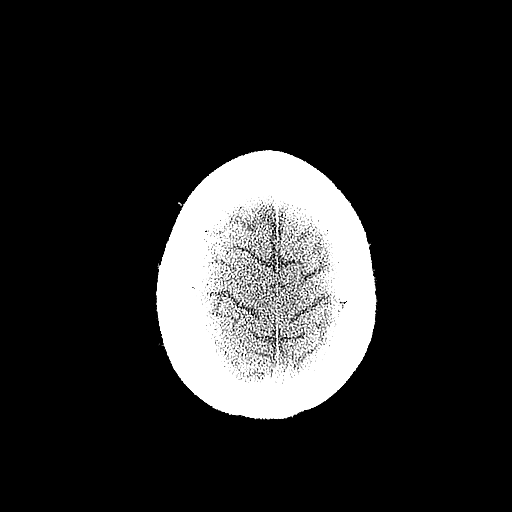
[im 51/72  bone]
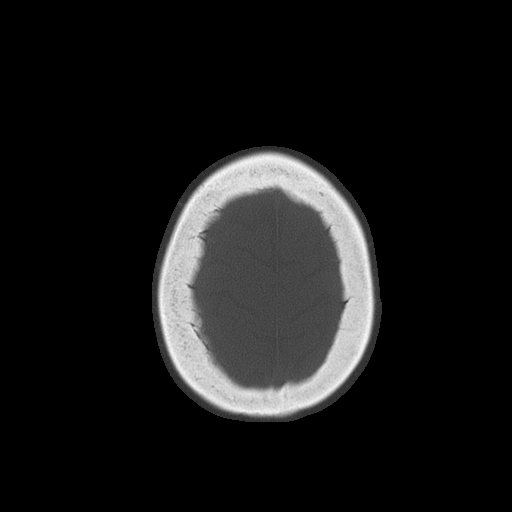
[im 61/72  brain]
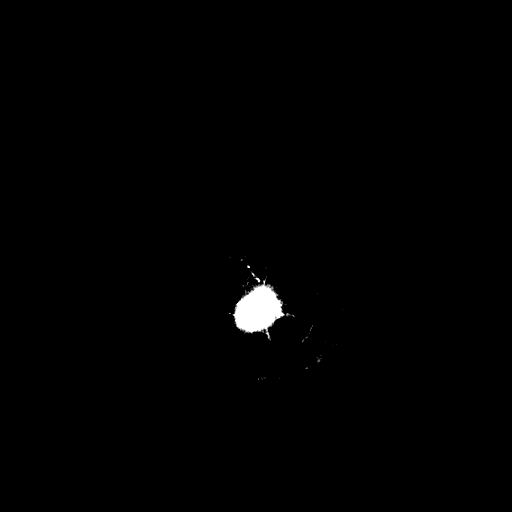

[Series 7: sagittal bone 2.0 · sagittal · 0.24mm/px · 3 of 60 slices shown]
[im 20/60  brain]
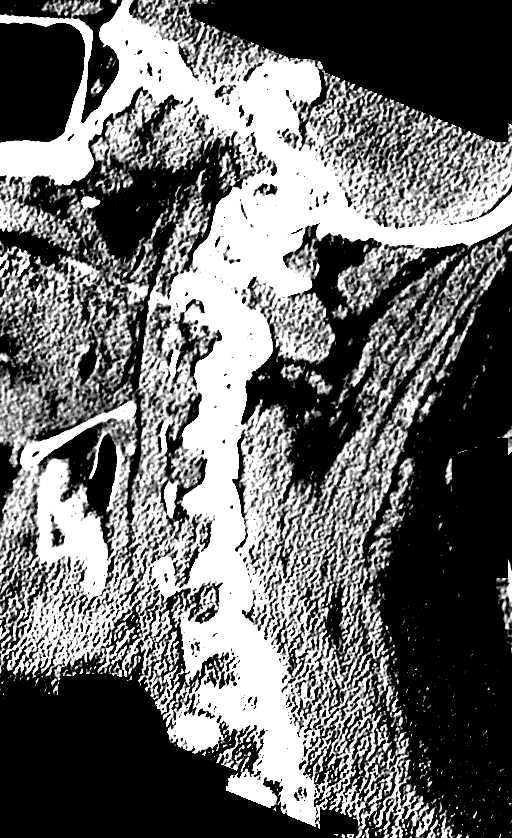
[im 30/60  brain]
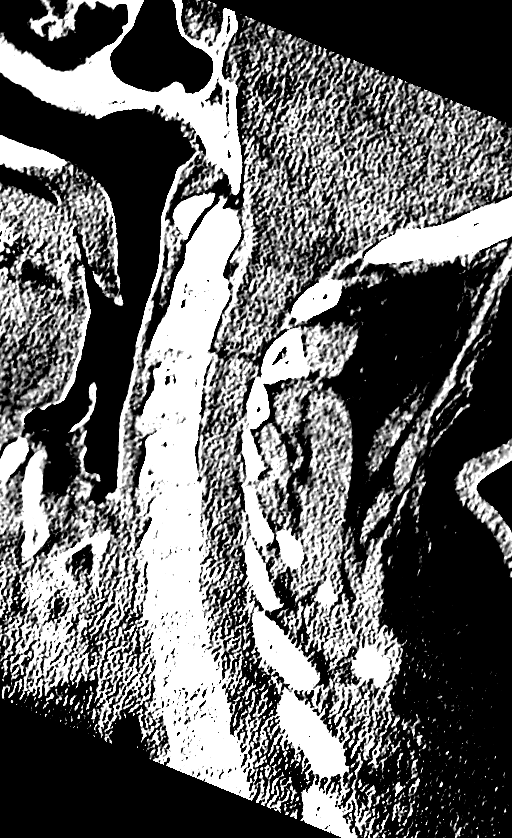
[im 40/60  brain]
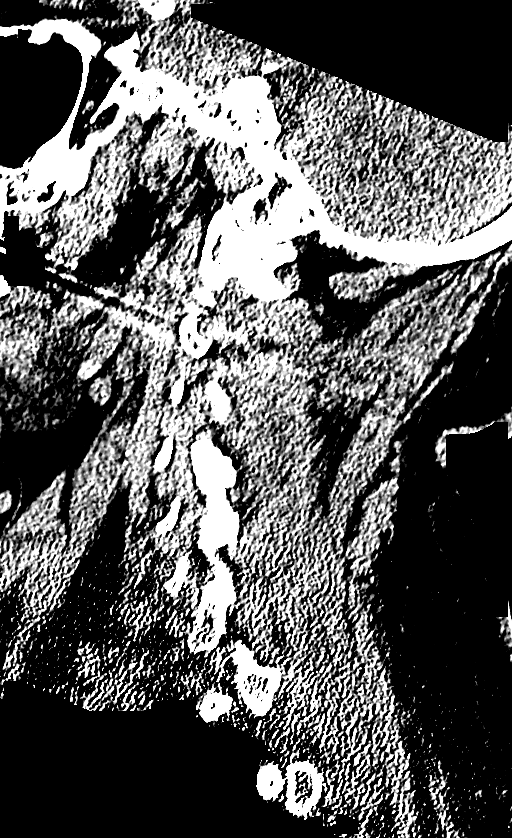

[Series 8: coronal bone 2.0 · coronal · 0.23mm/px · 3 of 52 slices shown]
[im 18/52  brain]
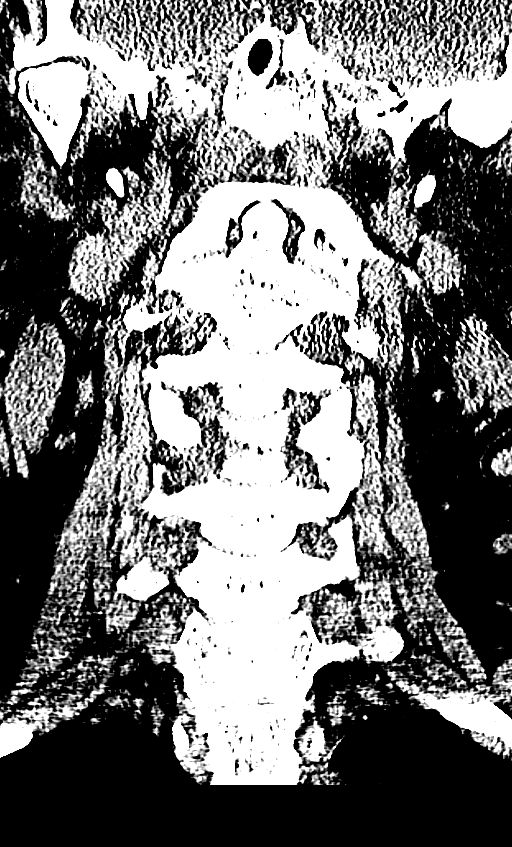
[im 23/52  brain]
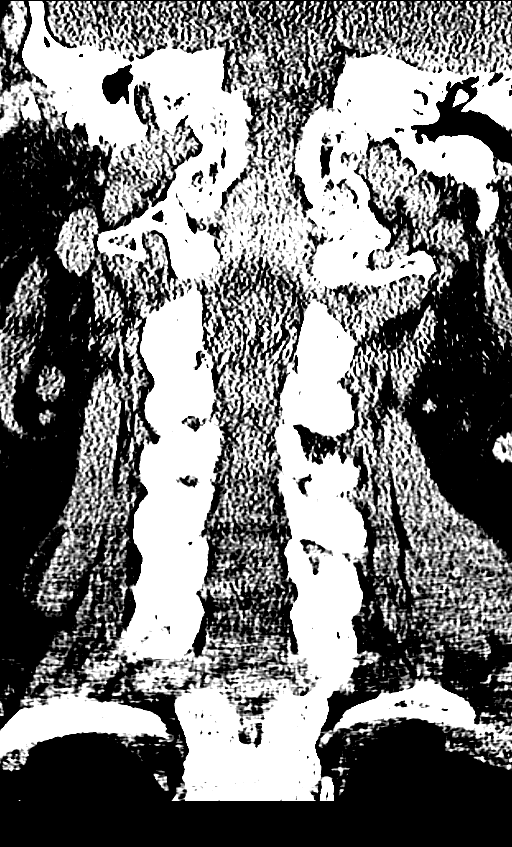
[im 29/52  brain]
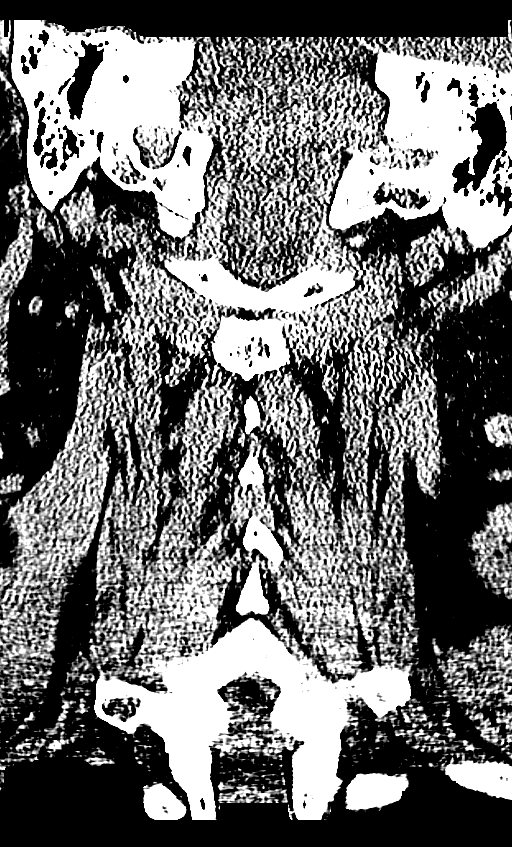

[12 of 47 positions shown; findings below may reference images not displayed]

FINDINGS: CT HEAD FINDINGS

There is no evidence of acute infarction, mass lesion, or intra- or
extra-axial hemorrhage on CT.

The posterior fossa, including the cerebellum, brainstem and fourth
ventricle, is within normal limits. The third and lateral
ventricles, and basal ganglia are unremarkable in appearance. The
cerebral hemispheres are symmetric in appearance, with normal
gray-white differentiation. No mass effect or midline shift is seen.

There is no evidence of fracture; visualized osseous structures are
unremarkable in appearance. The orbits are within normal limits. The
paranasal sinuses and mastoid air cells are well-aerated. No
significant soft tissue abnormalities are seen.

CT CERVICAL SPINE FINDINGS

There is no evidence of fracture or subluxation. Vertebral bodies
demonstrate normal height and alignment. Intervertebral disc spaces
are preserved. Prevertebral soft tissues are within normal limits.
The visualized neural foramina are grossly unremarkable.

The thyroid gland is unremarkable in appearance. The visualized lung
apices are clear. No significant soft tissue abnormalities are seen.
IMPRESSION: 1. No evidence of traumatic intracranial injury or fracture.
2. No evidence of fracture or subluxation along the cervical spine.

## 2013-03-20 IMAGING — CR DG LUMBAR SPINE COMPLETE 4+V
5 series · 5 of 5 positions shown · non-contrast
Comparison: CT of the abdomen and pelvis performed [DATE]

CLINICAL DATA: Status post fall; central lower back pain.

EXAM:
LUMBAR SPINE - COMPLETE 4+ VIEW

[view not recorded (1 of 5)]
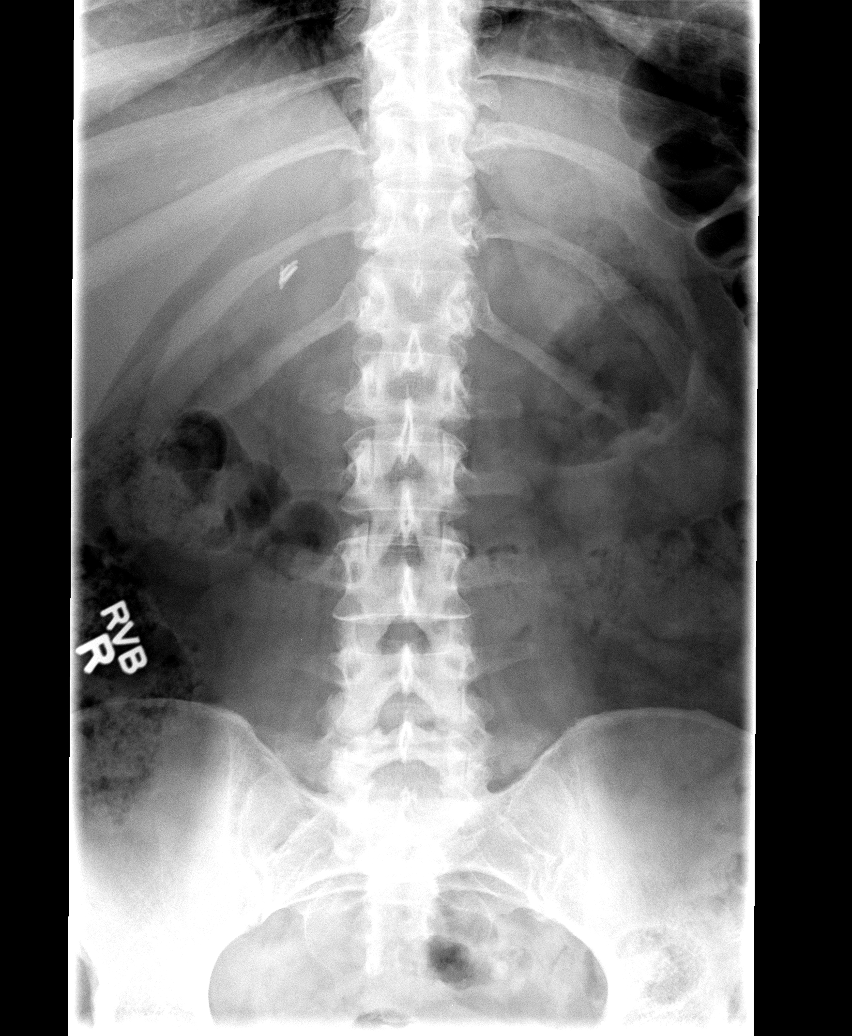

[view not recorded (2 of 5)]
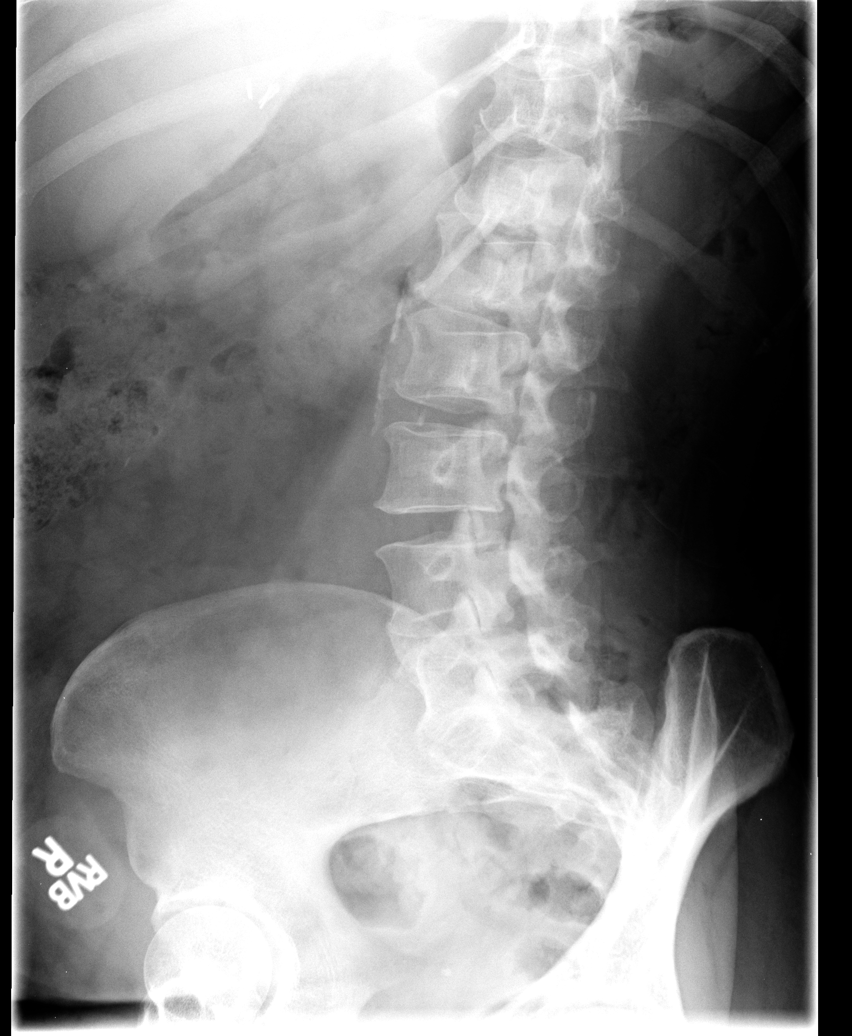

[view not recorded (3 of 5)]
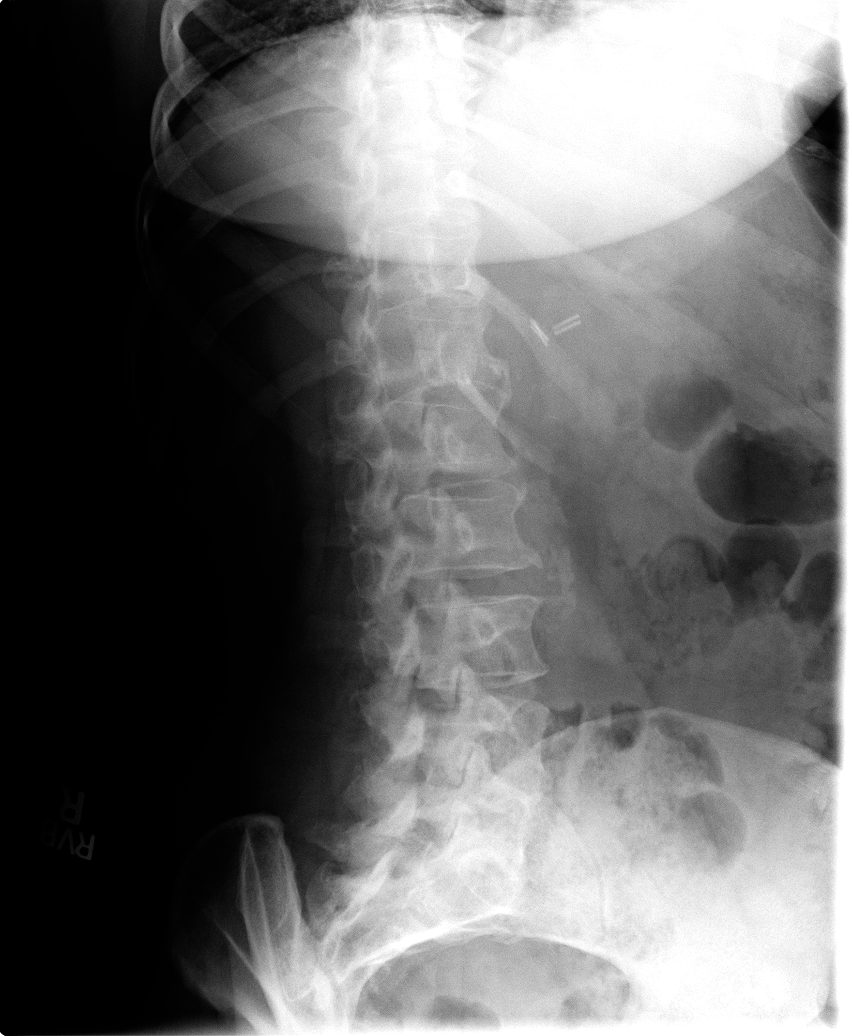

[view not recorded (4 of 5)]
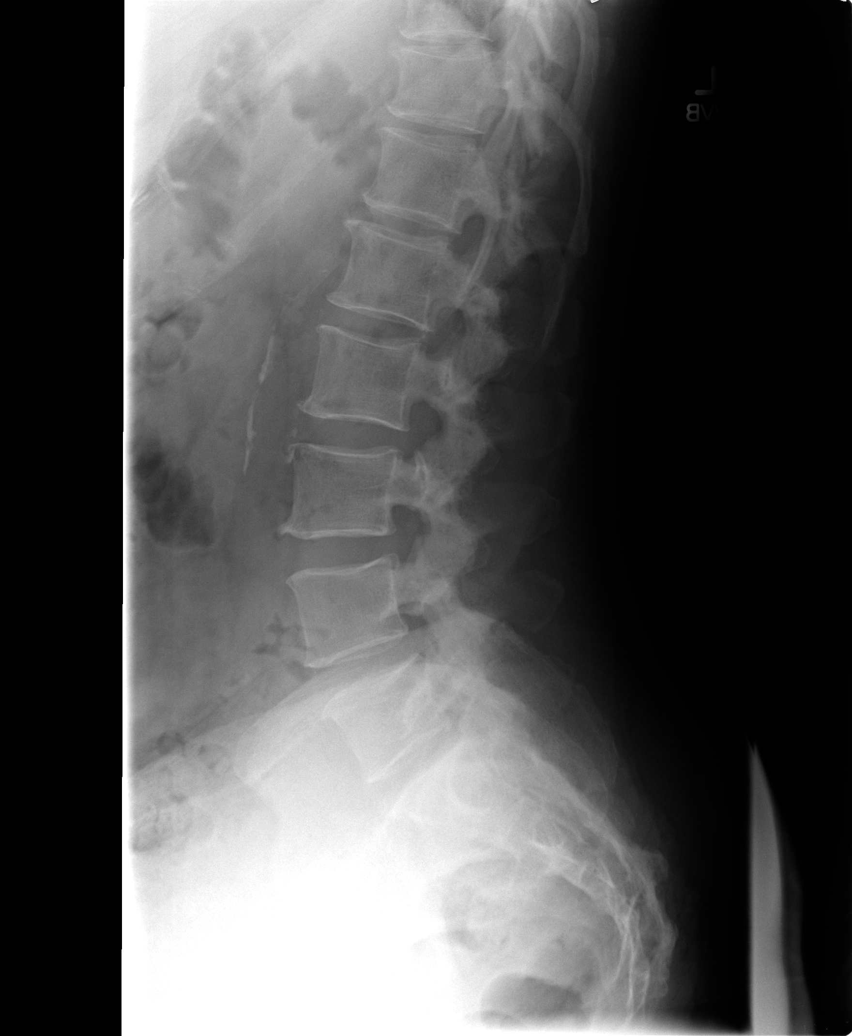

[view not recorded (5 of 5)]
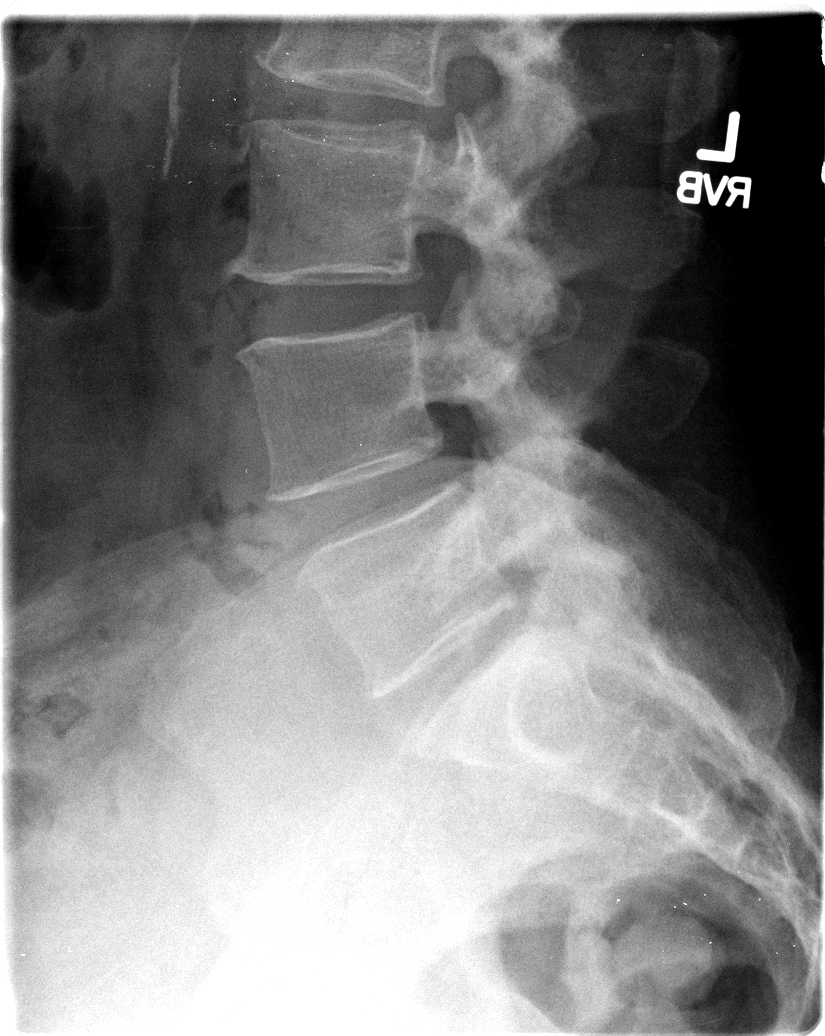

[5 of 5 positions shown; findings below may reference images not displayed]

FINDINGS: There is no evidence of fracture or subluxation. Vertebral bodies
demonstrate normal height and alignment. Intervertebral disc spaces
are preserved. The visualized neural foramina are grossly
unremarkable in appearance.

The visualized bowel gas pattern is unremarkable in appearance; air
and stool are noted within the colon. The sacroiliac joints are
within normal limits. Clips are noted within the right upper
quadrant, reflecting prior cholecystectomy.
IMPRESSION: No evidence of fracture or subluxation along the lumbar spine.

## 2013-03-20 IMAGING — CR DG THORACIC SPINE 2V
3 series · 3 of 3 positions shown · non-contrast
Comparison: None.

CLINICAL DATA: Fall, back pain

EXAM:
THORACIC SPINE - 2 VIEW

[view not recorded (1 of 3)]
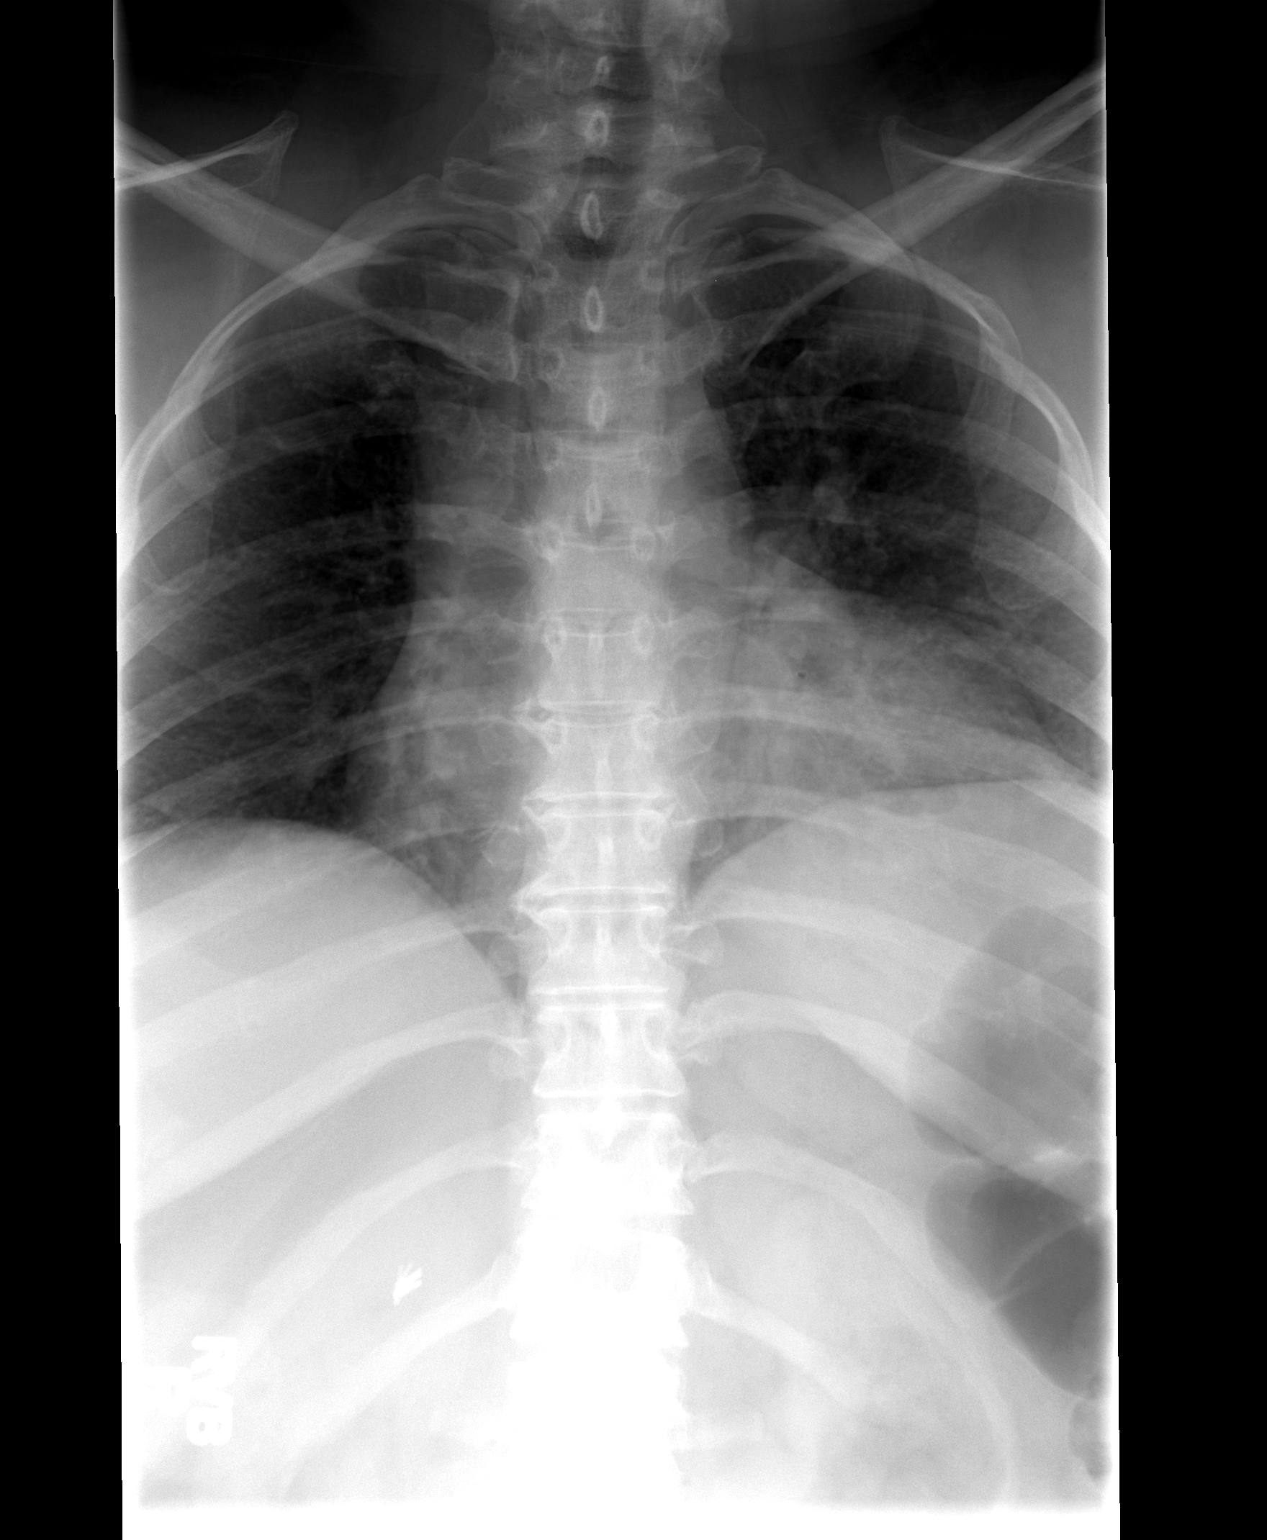

[view not recorded (2 of 3)]
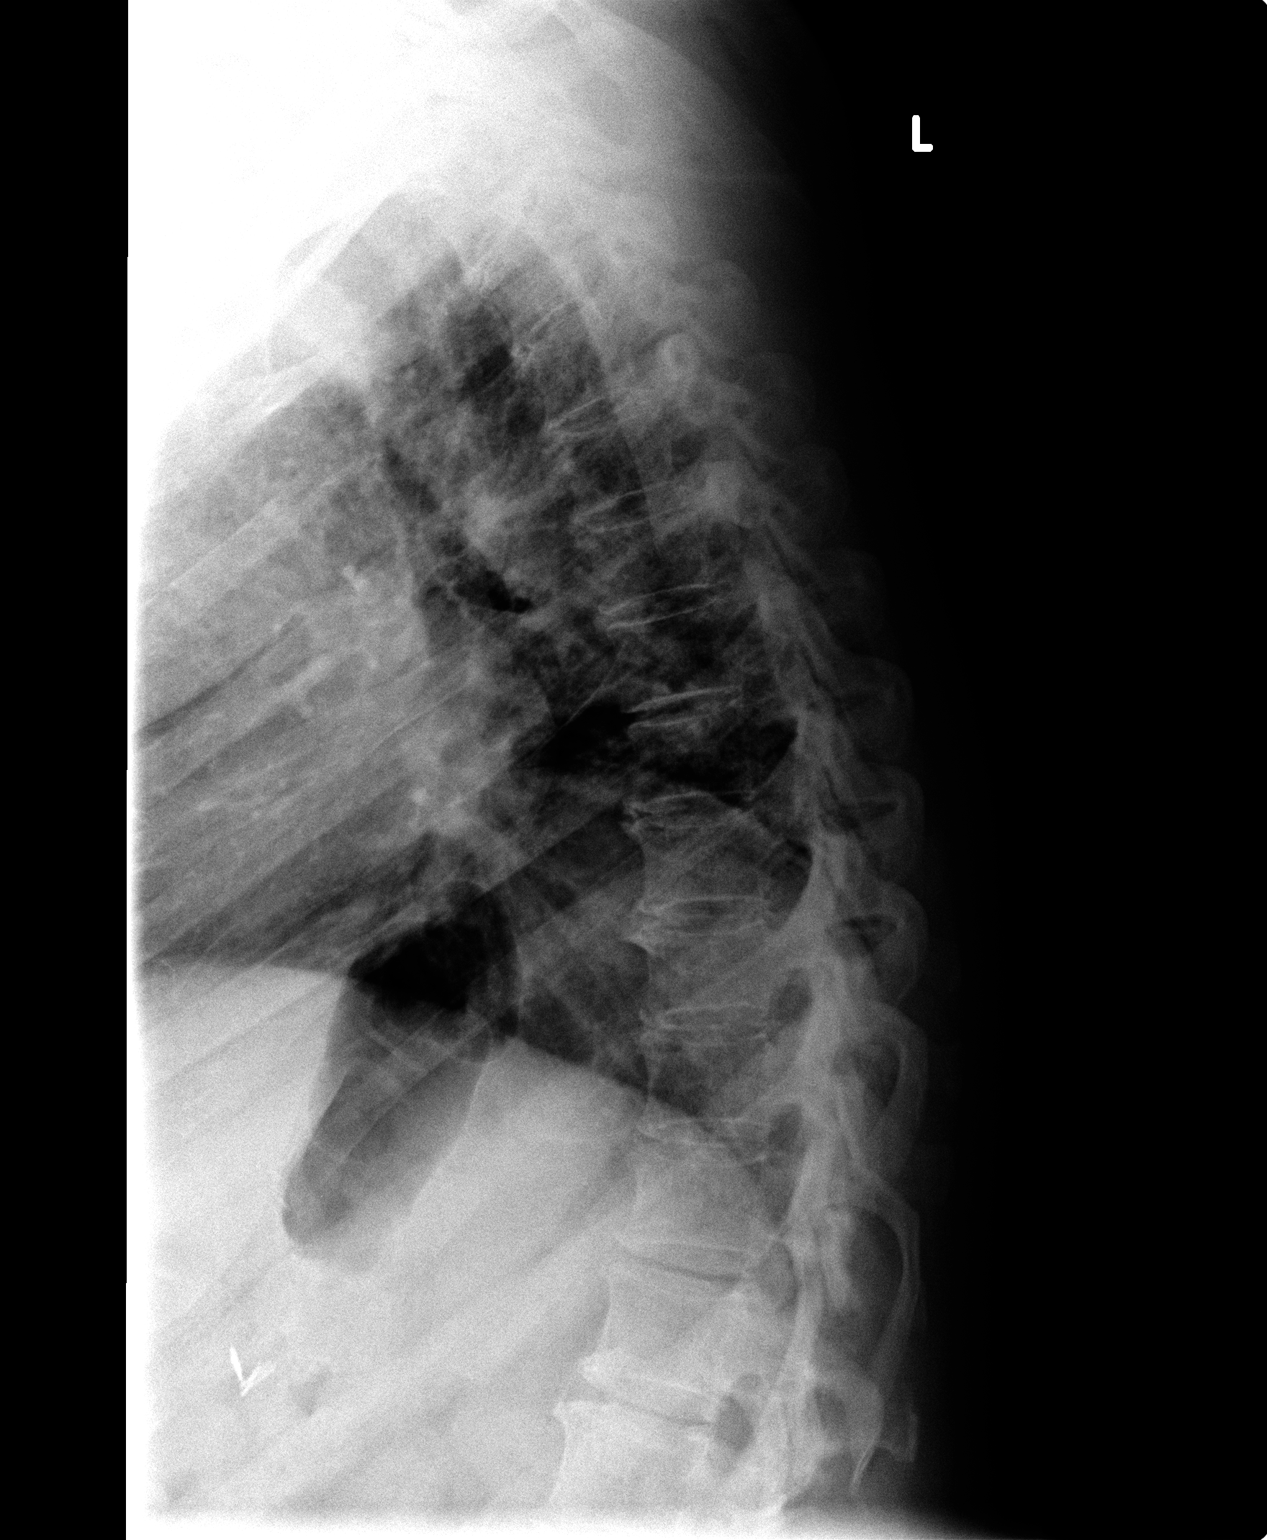

[view not recorded (3 of 3)]
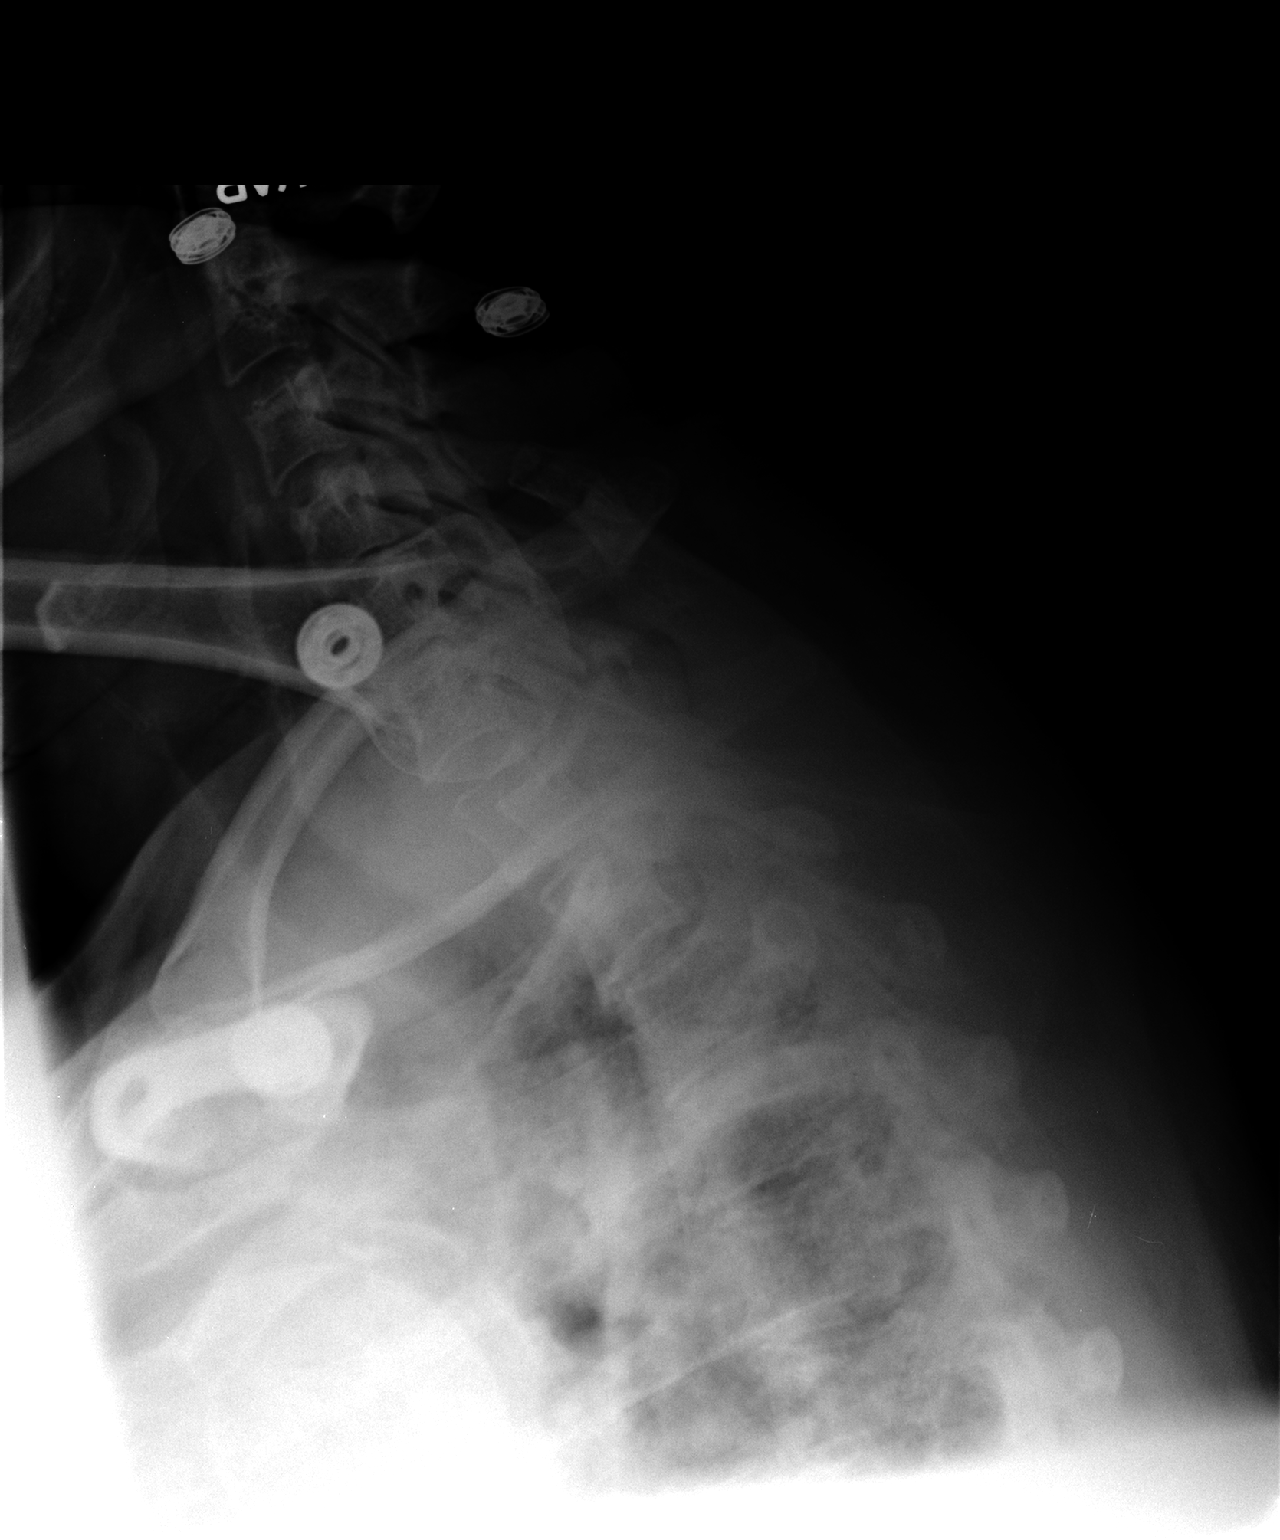

[3 of 3 positions shown; findings below may reference images not displayed]

FINDINGS: Normal thoracic kyphosis.

No evidence of fracture or dislocation. Vertebral body heights are
maintained.

Mild multilevel degenerative changes.

Visualized lungs are clear.

Cholecystectomy clips.
IMPRESSION: No fracture or dislocation is seen.

## 2013-03-20 MED ORDER — IBUPROFEN 800 MG PO TABS: 800.0000 mg | ORAL_TABLET | Freq: Three times a day (TID) | ORAL | Status: DC

## 2013-03-20 MED ORDER — HYDROCODONE-ACETAMINOPHEN 5-325 MG PO TABS: 2.0000 | ORAL_TABLET | ORAL | Status: DC | PRN

## 2013-03-20 MED ORDER — IBUPROFEN 800 MG PO TABS
800.0000 mg | ORAL_TABLET | Freq: Once | ORAL | Status: AC
  Administered 2013-03-20: 800 mg via ORAL
  Filled 2013-03-20: qty 1

## 2013-03-20 NOTE — Telephone Encounter (Signed)
Patient states she don't think she needs to be seen. If her symptoms get worse then she will call back and schedule ov.

## 2013-03-20 NOTE — ED Notes (Signed)
Pt fell on wed, hit head on 5 steps, states she did pass out, pt started having headache wed evening, shoulder soreness. Pt is also upset with uncles sudden death and wanted to get checked out.

## 2013-03-20 NOTE — ED Notes (Signed)
Patient given discharge instruction, verbalized understand. Patient ambulatory out of the department.  

## 2013-03-20 NOTE — Telephone Encounter (Signed)
Patient said that Wednesday when we had bad weather that she fell on her deck steps and hit 5 steps with her head. She has been sleeping fine, but is having a lot of headaches and wants to know if she may need to get an xray or CT ordered.

## 2013-03-20 NOTE — ED Notes (Signed)
Pt states she slipped on some ice on Wednesday and feback on her back. Pt states her back does hurt but she is more worried about her head because it hit all 5 steps.

## 2013-03-20 NOTE — ED Provider Notes (Signed)
CSN: [id number]     Arrival date & time 03/20/13  1945 History  This chart was scribed for Kaitlin Essex, MD by Marlowe Kays, ED Scribe. This patient was seen in room APA19/APA19 and the patient's care was started at 9:31 PM.  Chief Complaint  Patient presents with  . Fall   The history is provided by the patient. No language interpreter was used.   HPI Comments:  AADHYA Kaitlin [name] is a 58 y.o. female who presents to the Emergency Department complaining of slipping and falling down 5 steps of her deck two days ago hitting her head on all 5 steps. Pt reports associated HA and mid-neck and back pain. Pt states she also has a bruise on her buttocks. She states she believes she passed out after the fall and states she laid there for about 10 minutes. She denies taking anything for pain at home. Pt denies visual changes, vomiting, bowel or bladder incontinence. Pt denies h/o previous back problems.   Past Medical History  Diagnosis Date  . BMI 30.0-30.9,adult 2008 182 LBS    2009 194 LBS  . Helicobacter pylori gastritis AUG 2008 PREVPAC: nausea-->HELIDAC: GI UPSET    HB 14.1 NL HFP H. PYLORI STOOL Ag NEG  . Colon polyp APR 2008  . Anxiety   . Essential hypertension, benign   . Hypothyroidism   . Mixed hyperlipidemia   . Irritable bowel syndrome   . Tubulovillous adenoma 08/07/10    Colonoscopy w/ Dr Oneida Alar w/ 7 polyps removed-bx showed tubular adenomas.  Largest 1cm-hepatic flexure polyp->TA.    Marland Kitchen Panic attacks   . Reflux   . Kidney stones   . Depression   . Folliculitis [id number]  . Mixed stress and urge urinary incontinence 09/26/2012   Past Surgical History  Procedure Laterality Date  . Colonoscopy  APR 2008    AC/[place] SIMPLE ADENOMA  . Upper gastrointestinal endoscopy  AUG 2008 PAIN/NAUSEA    H. PYLORI treated  . Cholecystectomy    . Mass excision  02/28/2011    Procedure: EXCISION MASS;  Surgeon: Donato Heinz, MD;  Location: [facility];  Service: General;  Laterality: Right;   soft tissue mass  . Colonoscopy  08/07/2010    Dr. Raynald Kemp adenomas   Family History  Problem Relation Age of Onset  . Colon cancer Neg Hx   . Anesthesia problems Neg Hx   . Hypotension Neg Hx   . Malignant hyperthermia Neg Hx   . Pseudochol deficiency Neg Hx   . Coronary artery disease Mother   . Hypertension Mother   . Heart attack Mother   . Heart disease Mother   . Coronary artery disease Brother     Age 32  . Heart attack Brother   . Heart disease Brother   . Diabetes Sister   . Diabetes Sister    History  Substance Use Topics  . Smoking status: Never Smoker   . Smokeless tobacco: Never Used  . Alcohol Use: No   OB History   Grav Para Term Preterm Abortions TAB SAB Ect Mult Living   1 1             Review of Systems A complete 10 system review of systems was obtained and all systems are negative except as noted in the HPI and PMH.   Allergies  Asa; Augmentin; Levaquin; Rocephin; Valtrex; and Zoloft  Home Medications   Current Outpatient Rx  Name  Route  Sig  Dispense  Refill  .  ALPRAZolam (XANAX) 1 MG tablet      TAKE 1 TABLET BY MOUTH 3 TIMES A DAY AS NEEDED   90 tablet   2   . enalapril (VASOTEC) 20 MG tablet      TAKE 1 TABLET AT BEDTIME   30 tablet   5   . esomeprazole (NEXIUM) 40 MG capsule   Oral   Take 1 capsule (40 mg total) by mouth daily.   30 capsule   11   . HYDROcodone-acetaminophen (NORCO/VICODIN) 5-325 MG per tablet   Oral   Take 2 tablets by mouth every 4 (four) hours as needed.   10 tablet   0   . ibuprofen (ADVIL,MOTRIN) 800 MG tablet   Oral   Take 1 tablet (800 mg total) by mouth 3 (three) times daily.   21 tablet   0   . indapamide (LOZOL) 1.25 MG tablet      TAKE 1 TABLET BY MOUTH IN THE MORNING   30 tablet   1   . levothyroxine (SYNTHROID, LEVOTHROID) 50 MCG tablet      TAKE 1 TABLET EVERY DAY   30 tablet   5   . lovastatin (MEVACOR) 40 MG tablet      TAKE 1 TABLET AT BEDTIME   30 tablet   4   .  meloxicam (MOBIC) 15 MG tablet   Oral   Take 1 tablet (15 mg total) by mouth daily.   30 tablet   2   . oseltamivir (TAMIFLU) 75 MG capsule   Oral   Take 1 capsule (75 mg total) by mouth 2 (two) times daily.   10 capsule   0   . verapamil (CALAN-SR) 240 MG CR tablet      TAKE 1 TABLET EVERY DAY   30 tablet   1    Triage Vitals: BP 168/85  Pulse 86  Temp(Src) 98.2 F (36.8 C) (Oral)  Resp 16  Ht 5\' 5"  (1.651 m)  Wt 190 lb (86.183 kg)  BMI 31.62 kg/m2  SpO2 97% Physical Exam  Nursing note and vitals reviewed. Constitutional: She is oriented to person, place, and time. She appears well-developed and well-nourished. No distress.  HENT:  Head: Normocephalic and atraumatic.  Mouth/Throat: Oropharynx is clear and moist. No oropharyngeal exudate.  Eyes: Conjunctivae and EOM are normal. Pupils are equal, round, and reactive to light.  Neck: Normal range of motion.  Cardiovascular: Normal rate, regular rhythm and normal heart sounds.   No murmur heard. Pulmonary/Chest: Effort normal.  Abdominal: Soft. There is no tenderness. There is no rebound and no guarding.  Musculoskeletal: Normal range of motion.  Diffused tenderness to CTL spine.   Neurological: She is alert and oriented to person, place, and time. No cranial nerve deficit. She exhibits normal muscle tone. Coordination normal.  Skin: Skin is warm and dry.  Small occipital hematoma.    Psychiatric: She has a normal mood and affect. Her behavior is normal.  CN 2-12 intact, no ataxia on finger to nose, no nystagmus, 5/5 strength throughout, no pronator drift, Romberg negative, normal gait.   ED Course  Procedures (including critical care time) DIAGNOSTIC STUDIES: Oxygen Saturation is 97% on RA, normal by my interpretation.   COORDINATION OF CARE: 9:35 PM- Will give pt Ibuprofen for pain. Will order a CT of head, back and neck. Pt verbalizes understanding and agrees to plan.  Medications  ibuprofen (ADVIL,MOTRIN)  tablet 800 mg (800 mg Oral Given 03/20/13 2207)   Labs Review  Labs Reviewed - No data to display Imaging Review Dg Thoracic Spine 2 View  03/20/2013   CLINICAL DATA:  Fall, back pain  EXAM: THORACIC SPINE - 2 VIEW  COMPARISON:  None.  FINDINGS: Normal thoracic kyphosis.  No evidence of fracture or dislocation. Vertebral body heights are maintained.  Mild multilevel degenerative changes.  Visualized lungs are clear.  Cholecystectomy clips.  IMPRESSION: No fracture or dislocation is seen.   Electronically Signed   By: Julian Hy M.D.   On: 03/20/2013 22:36   Dg Lumbar Spine Complete  03/20/2013   CLINICAL DATA:  Status post fall; central lower back pain.  EXAM: LUMBAR SPINE - COMPLETE 4+ VIEW  COMPARISON:  CT of the abdomen and pelvis performed 08/10/2010  FINDINGS: There is no evidence of fracture or subluxation. Vertebral bodies demonstrate normal height and alignment. Intervertebral disc spaces are preserved. The visualized neural foramina are grossly unremarkable in appearance.  The visualized bowel gas pattern is unremarkable in appearance; air and stool are noted within the colon. The sacroiliac joints are within normal limits. Clips are noted within the right upper quadrant, reflecting prior cholecystectomy.  IMPRESSION: No evidence of fracture or subluxation along the lumbar spine.   Electronically Signed   By: Garald Balding M.D.   On: 03/20/2013 22:53   Ct Head Wo Contrast  03/20/2013   CLINICAL DATA:  Status post fall; headache. Syncope. Bilateral neck stiffness.  EXAM: CT HEAD WITHOUT CONTRAST  CT CERVICAL SPINE WITHOUT CONTRAST  TECHNIQUE: Multidetector CT imaging of the head and cervical spine was performed following the standard protocol without intravenous contrast. Multiplanar CT image reconstructions of the cervical spine were also generated.  COMPARISON:  Cervical spine radiographs performed 03/06/2010, and CT of the head performed 12/08/2009  FINDINGS: CT HEAD FINDINGS  There is  no evidence of acute infarction, mass lesion, or intra- or extra-axial hemorrhage on CT.  The posterior fossa, including the cerebellum, brainstem and fourth ventricle, is within normal limits. The third and lateral ventricles, and basal ganglia are unremarkable in appearance. The cerebral hemispheres are symmetric in appearance, with normal gray-white differentiation. No mass effect or midline shift is seen.  There is no evidence of fracture; visualized osseous structures are unremarkable in appearance. The orbits are within normal limits. The paranasal sinuses and mastoid air cells are well-aerated. No significant soft tissue abnormalities are seen.  CT CERVICAL SPINE FINDINGS  There is no evidence of fracture or subluxation. Vertebral bodies demonstrate normal height and alignment. Intervertebral disc spaces are preserved. Prevertebral soft tissues are within normal limits. The visualized neural foramina are grossly unremarkable.  The thyroid gland is unremarkable in appearance. The visualized lung apices are clear. No significant soft tissue abnormalities are seen.  IMPRESSION: 1. No evidence of traumatic intracranial injury or fracture. 2. No evidence of fracture or subluxation along the cervical spine.   Electronically Signed   By: Garald Balding M.D.   On: 03/20/2013 23:20   Ct Cervical Spine Wo Contrast  03/20/2013   CLINICAL DATA:  Status post fall; headache. Syncope. Bilateral neck stiffness.  EXAM: CT HEAD WITHOUT CONTRAST  CT CERVICAL SPINE WITHOUT CONTRAST  TECHNIQUE: Multidetector CT imaging of the head and cervical spine was performed following the standard protocol without intravenous contrast. Multiplanar CT image reconstructions of the cervical spine were also generated.  COMPARISON:  Cervical spine radiographs performed 03/06/2010, and CT of the head performed 12/08/2009  FINDINGS: CT HEAD FINDINGS  There is no evidence of acute infarction, mass  lesion, or intra- or extra-axial hemorrhage on  CT.  The posterior fossa, including the cerebellum, brainstem and fourth ventricle, is within normal limits. The third and lateral ventricles, and basal ganglia are unremarkable in appearance. The cerebral hemispheres are symmetric in appearance, with normal gray-white differentiation. No mass effect or midline shift is seen.  There is no evidence of fracture; visualized osseous structures are unremarkable in appearance. The orbits are within normal limits. The paranasal sinuses and mastoid air cells are well-aerated. No significant soft tissue abnormalities are seen.  CT CERVICAL SPINE FINDINGS  There is no evidence of fracture or subluxation. Vertebral bodies demonstrate normal height and alignment. Intervertebral disc spaces are preserved. Prevertebral soft tissues are within normal limits. The visualized neural foramina are grossly unremarkable.  The thyroid gland is unremarkable in appearance. The visualized lung apices are clear. No significant soft tissue abnormalities are seen.  IMPRESSION: 1. No evidence of traumatic intracranial injury or fracture. 2. No evidence of fracture or subluxation along the cervical spine.   Electronically Signed   By: Garald Balding M.D.   On: 03/20/2013 23:20    EKG Interpretation   None       MDM   1. Fall   2. Head injury    Mechanical fall 2 days ago on ice striking back of head and sliding down 5 steps. Questionable loss of consciousness. Complains of head, neck, back pain. No focal weakness, numbness or tingling. No anticoagulant use.  Neurologically intact. Patient able to ambulate without assistance. Imaging is negative for acute fracture or dislocation. CT head negative.  Low suspicion for serious intracranial or spinal cord injury. We'll treat for head injury and back contusion. Patient will be discharged with anti-inflammatories and pain medication. Return precautions discussed.   I personally performed the services described in this  documentation, which was scribed in my presence. The recorded information has been reviewed and is accurate.    Kaitlin Essex, MD 03/20/13 [phone]

## 2013-03-20 NOTE — Telephone Encounter (Signed)
Nurses, call pt, ntbs before any imaging ordered,

## 2013-03-20 NOTE — Discharge Instructions (Signed)
Head Injury, Adult  You have received a head injury. It does not appear serious at this time. Headaches and vomiting are common following head injury. It should be easy to awaken from sleeping. Sometimes it is necessary for you to stay in the emergency department for a while for observation. Sometimes admission to the hospital may be needed. After injuries such as yours, most problems occur within the first 24 hours, but side effects may occur up to 7 10 days after the injury. It is important for you to carefully monitor your condition and contact your health care provider or seek immediate medical care if there is a change in your condition.  WHAT ARE THE TYPES OF HEAD INJURIES?  Head injuries can be as minor as a bump. Some head injuries can be more severe. More severe head injuries include:  · A jarring injury to the brain (concussion).  · A bruise of the brain (contusion). This mean there is bleeding in the brain that can cause swelling.  · A cracked skull (skull fracture).  · Bleeding in the brain that collects, clots, and forms a bump (hematoma).  WHAT CAUSES A HEAD INJURY?  A serious head injury is most likely to happen to someone who is in a car wreck and is not wearing a seat belt. Other causes of major head injuries include bicycle or motorcycle accidents, sports injuries, and falls.  HOW ARE HEAD INJURIES DIAGNOSED?  A complete history of the event leading to the injury and your current symptoms will be helpful in diagnosing head injuries. Many times, pictures of the brain, such as CT or MRI are needed to see the extent of the injury. Often, an overnight hospital stay is necessary for observation.   WHEN SHOULD I SEEK IMMEDIATE MEDICAL CARE?   You should get help right away if:  · You have confusion or drowsiness.  · You feel sick to your stomach (nauseous) or have continued, forceful vomiting.  · You have dizziness or unsteadiness that is getting worse.  · You have severe, continued headaches not  relieved by medicine. Only take over-the-counter or prescription medicines for pain, fever, or discomfort as directed by your health care provider.  · You do not have normal function of the arms or legs or are unable to walk.  · You notice changes in the black spots in the center of the colored part of your eye (pupil).  · You have a clear or bloody fluid coming from your nose or ears.  · You have a loss of vision.  During the next 24 hours after the injury, you must stay with someone who can watch you for the warning signs. This person should contact local emergency services (911 in the [place] if you have seizures, you become unconscious, or you are unable to wake up.  HOW CAN I PREVENT A HEAD INJURY IN THE FUTURE?  The most important factor for preventing major head injuries is avoiding motor vehicle accidents.  To minimize the potential for damage to your head, it is crucial to wear seat belts while riding in motor vehicles. Wearing helmets while bike riding and playing collision sports (like football) is also helpful. Also, avoiding dangerous activities around the house will further help reduce your risk of head injury.   WHEN CAN I RETURN TO NORMAL ACTIVITIES AND ATHLETICS?  You should be reevaluated by your health care provider before returning to these activities. If you have any of the following symptoms, you should not return   to activities or contact sports until 1 week after the symptoms have stopped:  · Persistent headache.  · Dizziness or vertigo.  · Poor attention and concentration.  · Confusion.  · Memory problems.  · Nausea or vomiting.  · Fatigue or tire easily.  · Irritability.  · Intolerant of bright lights or loud noises.  · Anxiety or depression.  · Disturbed sleep.  MAKE SURE YOU:   · Understand these instructions.  · Will watch your condition.  · Will get help right away if you are not doing well or get worse.  Document Released: 02/19/2005 Document Revised: 12/10/2012 Document Reviewed:  10/27/2012  [employer]® Patient Information ©2014 [employer].

## 2013-03-23 ENCOUNTER — Other Ambulatory Visit: Payer: Self-pay | Admitting: Family Medicine

## 2013-05-04 ENCOUNTER — Telehealth: Payer: Self-pay | Admitting: Family Medicine

## 2013-05-04 ENCOUNTER — Other Ambulatory Visit: Payer: Self-pay | Admitting: *Deleted

## 2013-05-04 DIAGNOSIS — E039 Hypothyroidism, unspecified: Secondary | ICD-10-CM

## 2013-05-04 DIAGNOSIS — Z79899 Other long term (current) drug therapy: Secondary | ICD-10-CM

## 2013-05-04 DIAGNOSIS — I10 Essential (primary) hypertension: Secondary | ICD-10-CM

## 2013-05-04 DIAGNOSIS — E785 Hyperlipidemia, unspecified: Secondary | ICD-10-CM

## 2013-05-04 NOTE — Telephone Encounter (Signed)
Lip liv m7 tsh 

## 2013-05-04 NOTE — Telephone Encounter (Signed)
Patient notified

## 2013-05-04 NOTE — Telephone Encounter (Signed)
Patient needs blood work order. She has requested to have "all" of her bloodwork checked due to wellness program at work.

## 2013-05-12 ENCOUNTER — Other Ambulatory Visit: Payer: Self-pay | Admitting: Obstetrics & Gynecology

## 2013-05-12 DIAGNOSIS — Z1231 Encounter for screening mammogram for malignant neoplasm of breast: Secondary | ICD-10-CM

## 2013-05-16 LAB — LIPID PANEL
CHOL/HDL RATIO: 3.9 ratio
CHOLESTEROL: 201 mg/dL — AB (ref 0–200)
HDL: 51 mg/dL (ref >39)
LDL Cholesterol: 132 mg/dL — ABNORMAL HIGH (ref 0–99)
TRIGLYCERIDES: 91 mg/dL (ref <150)
VLDL: 18 mg/dL (ref 0–40)

## 2013-05-16 LAB — HEPATIC FUNCTION PANEL
ALBUMIN: 4.3 g/dL (ref 3.5–5.2)
ALT: 25 U/L (ref 0–35)
AST: 21 U/L (ref 0–37)
Alkaline Phosphatase: 63 U/L (ref 39–117)
Bilirubin, Direct: 0.1 mg/dL (ref 0.0–0.3)
Indirect Bilirubin: 0.4 mg/dL (ref 0.2–1.2)
TOTAL PROTEIN: 7.2 g/dL (ref 6.0–8.3)
Total Bilirubin: 0.5 mg/dL (ref 0.2–1.2)

## 2013-05-16 LAB — BASIC METABOLIC PANEL
BUN: 13 mg/dL (ref 6–23)
CALCIUM: 10.4 mg/dL (ref 8.4–10.5)
CO2: 24 mEq/L (ref 19–32)
Chloride: 105 mEq/L (ref 96–112)
Creat: 0.71 mg/dL (ref 0.50–1.10)
Glucose, Bld: 99 mg/dL (ref 70–99)
POTASSIUM: 4 meq/L (ref 3.5–5.3)
SODIUM: 138 meq/L (ref 135–145)

## 2013-05-16 LAB — TSH: TSH: 3.423 u[IU]/mL (ref 0.350–4.500)

## 2013-05-22 ENCOUNTER — Ambulatory Visit (INDEPENDENT_AMBULATORY_CARE_PROVIDER_SITE_OTHER): Payer: BC Managed Care – PPO | Admitting: Family Medicine

## 2013-05-22 ENCOUNTER — Encounter: Payer: Self-pay | Admitting: Family Medicine

## 2013-05-22 DIAGNOSIS — E039 Hypothyroidism, unspecified: Secondary | ICD-10-CM

## 2013-05-22 DIAGNOSIS — E349 Endocrine disorder, unspecified: Secondary | ICD-10-CM

## 2013-05-22 DIAGNOSIS — I872 Venous insufficiency (chronic) (peripheral): Secondary | ICD-10-CM

## 2013-05-22 DIAGNOSIS — I10 Essential (primary) hypertension: Secondary | ICD-10-CM

## 2013-05-22 DIAGNOSIS — I878 Other specified disorders of veins: Secondary | ICD-10-CM

## 2013-05-22 MED ORDER — CLARITHROMYCIN 500 MG PO TABS: 500.0000 mg | ORAL_TABLET | Freq: Two times a day (BID) | ORAL | Status: DC

## 2013-05-22 NOTE — Progress Notes (Signed)
   Subjective:    Patient ID: Kaitlin [name], female    DOB: Dec 06, 1955, 58 y.o.   MRN: [id number]  HPIFollow up bloodwork.   Chills, scratchy throat, sinus drainage, congestion. Started 2 weeks ago.   In addition, patient claims compliance with all of her usual medications. No obvious side effects from her cholesterol medicine. Trying to work on fat on her diet.  Compliant with thyroid medicine. Some fatigue at times. A lot of ongoing stress at work.  History of calcium abnormality. This in turn led to [name] to check a parathyroid hormone. It was elevated back in the wintertime.  Review of Systems No headache no chest pain no back pain no abdominal pain no change in bowel habits ROS otherwise negative    Objective:   Physical Exam  Alert no apparent distress. Vitals stable. Blood pressure good on repeat. HEENT nasal discharge otherwise normal. Lungs clear. Heart regular in rhythm. Abdomen benign. Ankles without edema.      Assessment & Plan:  Impression 1 hypertension good control. #2 hyperlipidemia good control. #3 hypothyroidism good control. #4 rhinosinusitis #5 history of elevated parathyroid hormone plan Biaxin 500 twice a day 10 days. Maintain all medications. Diet exercise discussed. Will check a parathyroid hormone further recommendations based on results. WSL

## 2013-05-24 DIAGNOSIS — E349 Endocrine disorder, unspecified: Secondary | ICD-10-CM | POA: Insufficient documentation

## 2013-05-25 ENCOUNTER — Ambulatory Visit (HOSPITAL_COMMUNITY)
Admission: RE | Admit: 2013-05-25 | Discharge: 2013-05-25 | Disposition: A | Discharge status: Home / Self Care | Payer: BC Managed Care – PPO | Source: Ambulatory Visit | Attending: Obstetrics & Gynecology | Admitting: Obstetrics & Gynecology

## 2013-05-25 DIAGNOSIS — Z1231 Encounter for screening mammogram for malignant neoplasm of breast: Secondary | ICD-10-CM

## 2013-05-25 LAB — PTH, INTACT AND CALCIUM
Calcium: 10.6 mg/dL — ABNORMAL HIGH (ref 8.4–10.5)
PTH: 91.4 pg/mL — ABNORMAL HIGH (ref 14.0–72.0)

## 2013-06-01 NOTE — Addendum Note (Signed)
Addended byCharolotte Capuchin D on: 06/01/2013 04:16 PM   Modules accepted: Orders

## 2013-06-01 NOTE — Progress Notes (Signed)
Patient notified and verbalized understanding of the test results.  

## 2013-06-17 ENCOUNTER — Other Ambulatory Visit (HOSPITAL_COMMUNITY): Payer: Self-pay | Admitting: "Endocrinology

## 2013-06-17 DIAGNOSIS — M810 Age-related osteoporosis without current pathological fracture: Secondary | ICD-10-CM

## 2013-06-18 ENCOUNTER — Other Ambulatory Visit: Payer: Self-pay | Admitting: Family Medicine

## 2013-06-19 NOTE — Telephone Encounter (Signed)
Ok if time plus 5 monthly ref 

## 2013-06-22 ENCOUNTER — Other Ambulatory Visit: Payer: Self-pay | Admitting: Family Medicine

## 2013-06-23 ENCOUNTER — Other Ambulatory Visit: Payer: Self-pay | Admitting: Family Medicine

## 2013-06-24 ENCOUNTER — Other Ambulatory Visit (HOSPITAL_COMMUNITY): Payer: BC Managed Care – PPO

## 2013-06-25 ENCOUNTER — Ambulatory Visit (HOSPITAL_COMMUNITY)
Admission: RE | Admit: 2013-06-25 | Discharge: 2013-06-25 | Disposition: A | Discharge status: Home / Self Care | Payer: BC Managed Care – PPO | Source: Ambulatory Visit | Attending: "Endocrinology | Admitting: "Endocrinology

## 2013-06-25 DIAGNOSIS — M810 Age-related osteoporosis without current pathological fracture: Secondary | ICD-10-CM

## 2013-06-26 ENCOUNTER — Other Ambulatory Visit: Payer: Self-pay | Admitting: Family Medicine

## 2013-08-16 ENCOUNTER — Encounter: Payer: Self-pay | Admitting: Internal Medicine

## 2013-08-20 ENCOUNTER — Encounter: Payer: Self-pay | Admitting: Gastroenterology

## 2013-08-20 ENCOUNTER — Other Ambulatory Visit: Payer: Self-pay | Admitting: Family Medicine

## 2013-08-20 ENCOUNTER — Ambulatory Visit (INDEPENDENT_AMBULATORY_CARE_PROVIDER_SITE_OTHER): Payer: BC Managed Care – PPO | Admitting: Gastroenterology

## 2013-08-20 ENCOUNTER — Encounter (INDEPENDENT_AMBULATORY_CARE_PROVIDER_SITE_OTHER): Payer: Self-pay

## 2013-08-20 VITALS — BP 126/78 | HR 87 | Temp 99.0°F | Ht 65.0 in | Wt 202.4 lb

## 2013-08-20 DIAGNOSIS — Z86010 Personal history of colonic polyps: Secondary | ICD-10-CM

## 2013-08-20 MED ORDER — PEG 3350-KCL-NA BICARB-NACL 420 G PO SOLR: 4000.0000 mL | ORAL | Status: DC

## 2013-08-20 NOTE — Progress Notes (Signed)
Referring [name]: [name], MD Primary Care Physician:  Rubbie Battiest, MD Primary GI: Dr. Gala Romney   Chief Complaint  Patient presents with  . Colonoscopy    HPI:   Kaitlin [name] presents today for a visit prior to surveillance colonoscopy secondary to history of adenomatous polyps. Last colonoscopy in 2012 with tubulovillous and tubular adenomas. History of chronic constipation. Taking Miralax daily. BM every other day. Occasional abdominal discomfort improved after BM. No rectal bleeding. Rare indigestion. Nexium OTC prn. No melena. Wants to stay with Miralax instead of trying an alternative.   Past Medical History  Diagnosis Date  . BMI 30.0-30.9,adult 2008 182 LBS    2009 194 LBS  . Helicobacter pylori gastritis AUG 2008 PREVPAC: nausea-->HELIDAC: GI UPSET    HB 14.1 NL HFP H. PYLORI STOOL Ag NEG  . Colon polyp APR 2008  . Anxiety   . Essential hypertension, benign   . Hypothyroidism   . Mixed hyperlipidemia   . Irritable bowel syndrome   . Tubulovillous adenoma 08/07/10    Colonoscopy w/ Dr Oneida Alar w/ 7 polyps removed-bx showed tubular adenomas.  Largest 1cm-hepatic flexure polyp->TA.    Marland Kitchen Panic attacks   . Reflux   . Kidney stones   . Depression   . Folliculitis 7/82/9562  . Mixed stress and urge urinary incontinence 09/26/2012    Past Surgical History  Procedure Laterality Date  . Colonoscopy  APR 2008    AC/[place] SIMPLE ADENOMA  . Upper gastrointestinal endoscopy  AUG 2008 PAIN/NAUSEA    H. PYLORI treated  . Cholecystectomy    . Mass excision  02/28/2011    Procedure: EXCISION MASS;  Surgeon: Donato Heinz, MD;  Location: [facility];  Service: General;  Laterality: Right;  soft tissue mass  . Colonoscopy  08/07/2010    Dr. Raynald Kemp adenomas and tubulovillous adenoma; needs surveillance 2015    Current Outpatient Prescriptions  Medication Sig Dispense Refill  . ALPRAZolam (XANAX) 1 MG tablet TAKE 1 TABLET 3 TIMES A DAY  90 tablet  5  . enalapril  (VASOTEC) 20 MG tablet TAKE 1 TABLET AT BEDTIME  30 tablet  5  . ibuprofen (ADVIL,MOTRIN) 800 MG tablet Take 1 tablet (800 mg total) by mouth 3 (three) times daily.  21 tablet  0  . indapamide (LOZOL) 1.25 MG tablet TAKE 1 TABLET BY MOUTH IN THE MORNING  30 tablet  5  . levothyroxine (SYNTHROID, LEVOTHROID) 50 MCG tablet TAKE 1 TABLET EVERY DAY  30 tablet  5  . lovastatin (MEVACOR) 40 MG tablet TAKE 1 TABLET AT BEDTIME  30 tablet  4  . verapamil (CALAN-SR) 240 MG CR tablet TAKE 1 TABLET BY MOUTH EVERY DAY  30 tablet  5  . polyethylene glycol-electrolytes (TRILYTE) 420 G solution Take 4,000 mLs by mouth as directed.  4000 mL  0   No current facility-administered medications for this visit.    Allergies as of 08/20/2013 - Review Complete 08/20/2013  Allergen Reaction Noted  . Asa [aspirin]  06/09/2012  . Augmentin [amoxicillin-pot clavulanate]  06/09/2012  . Levaquin [levofloxacin in d5w]  07/14/2012  . Rocephin [ceftriaxone sodium in dextrose]  06/09/2012  . Valtrex [valacyclovir hcl]  06/09/2012  . Zoloft [sertraline hcl]  06/09/2012    Family History  Problem Relation Age of Onset  . Colon cancer Neg Hx   . Anesthesia problems Neg Hx   . Hypotension Neg Hx   . Malignant hyperthermia Neg Hx   .  Pseudochol deficiency Neg Hx   . Coronary artery disease Mother   . Hypertension Mother   . Heart attack Mother   . Heart disease Mother   . Coronary artery disease Brother     Age 42  . Heart attack Brother   . Heart disease Brother   . Diabetes Sister   . Diabetes Sister     History   Social History  . Marital Status: Married    Spouse Name: N/A    Number of Children: N/A  . Years of Education: N/A   Social History Main Topics  . Smoking status: Never Smoker   . Smokeless tobacco: Never Used     Comment: Never Smoked  . Alcohol Use: No  . Drug Use: No  . Sexual Activity: No   Other Topics Concern  . None   Social History Narrative  . None    Review of  Systems: Negative unless mentioned in HPI  Physical Exam: BP 126/78  Pulse 87  Temp(Src) 99 F (37.2 C) (Oral)  Ht 5\' 5"  (1.651 m)  Wt 202 lb 6.4 oz (91.808 kg)  BMI 33.68 kg/m2 General:   Alert and oriented. No distress noted. Pleasant and cooperative.  Head:  Normocephalic and atraumatic. Eyes:  Conjuctiva clear without scleral icterus. Mouth:  Oral mucosa pink and moist. Good dentition. No lesions. Heart:  S1, S2 present without murmurs, rubs, or gallops. Regular rate and rhythm. Abdomen:  +BS, soft, non-tender and non-distended. No rebound or guarding. No HSM or masses noted. Msk:  Symmetrical without gross deformities. Normal posture. Extremities:  Without edema. Neurologic:  Alert and  oriented x4;  grossly normal neurologically. Skin:  Intact without significant lesions or rashes. Psych:  Alert and cooperative. Normal mood and affect.

## 2013-08-20 NOTE — Patient Instructions (Signed)
We have scheduled you for a colonoscopy with Dr. Rourk in the near future.    

## 2013-08-25 ENCOUNTER — Encounter: Payer: Self-pay | Admitting: Family Medicine

## 2013-08-25 ENCOUNTER — Ambulatory Visit (INDEPENDENT_AMBULATORY_CARE_PROVIDER_SITE_OTHER): Payer: BC Managed Care – PPO | Admitting: Family Medicine

## 2013-08-25 VITALS — BP 162/90 | Temp 98.3°F | Ht 65.0 in | Wt 205.0 lb

## 2013-08-25 DIAGNOSIS — I10 Essential (primary) hypertension: Secondary | ICD-10-CM

## 2013-08-25 DIAGNOSIS — I872 Venous insufficiency (chronic) (peripheral): Secondary | ICD-10-CM

## 2013-08-25 DIAGNOSIS — E039 Hypothyroidism, unspecified: Secondary | ICD-10-CM

## 2013-08-25 DIAGNOSIS — I878 Other specified disorders of veins: Secondary | ICD-10-CM

## 2013-08-25 NOTE — Progress Notes (Signed)
   Subjective:    Patient ID: Kaitlin [name], female    DOB: [birth year], 58 y.o.   MRN: [id number]  Leg Pain  The incident occurred more than 1 week ago. There was no injury mechanism. The pain is present in the left leg and right leg. Quality: soreness. The pain is moderate. The pain has been constant since onset. She reports no foreign bodies present. Nothing aggravates the symptoms. She has tried elevation for the symptoms. The treatment provided mild relief.  Cough This is a new problem. The current episode started in the past 7 days. The problem has been unchanged. The cough is non-productive. Associated symptoms include headaches. Nothing aggravates the symptoms. She has tried nothing for the symptoms. The treatment provided no relief.  Patient states that she has bilateral ankle swelling also.  Patient states that she has no other concerns at this time.  Patient became very lightheaded at work. Felt like she might pass out. 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 Compliant with medicines. Trying to maintain good fluid intake.  No vent in the plant   bp has been high   Review of Systems  Respiratory: Positive for cough.   Neurological: Positive for headaches.       Objective:   Physical Exam Alert moderate malaise. Blood pressure good on repeat 130/78 pharynx slightly dry neck supple. Lungs clear heart slight tachycardia ankles 1+ edema       Assessment & Plan:  Impression heat exhaustion this has been an ongoing problem for patient. Primarily due to sensitivity to heat along with blood pressure medication. Plan work excuse given. Increase salt intake. Increase fluid intake. Maintain meds. Warning signs discussed. WSL

## 2013-08-26 ENCOUNTER — Encounter: Payer: Self-pay | Admitting: Gastroenterology

## 2013-08-26 NOTE — Assessment & Plan Note (Signed)
58 year old female with advanced adenoma in 2012, due for surveillance colonoscopy now. No concerning lower GI symptoms. Chronic constipation managed with Miralax; I offered a prescription agent such as Linzess or Amitiza, but she would like to remain on current regimen.   Proceed with TCS with Dr. Gala Romney in near future: the risks, benefits, and alternatives have been discussed with the patient in detail. The patient states understanding and desires to proceed. Phenergan 12.5 mg IV on call due to polypharmacy

## 2013-08-26 NOTE — Progress Notes (Signed)
cc'd to pcp 

## 2013-08-28 DIAGNOSIS — Z0289 Encounter for other administrative examinations: Secondary | ICD-10-CM

## 2013-09-02 ENCOUNTER — Other Ambulatory Visit: Payer: BC Managed Care – PPO | Admitting: Obstetrics & Gynecology

## 2013-09-03 ENCOUNTER — Encounter (HOSPITAL_COMMUNITY): Payer: Self-pay | Admitting: Pharmacy Technician

## 2013-09-18 ENCOUNTER — Encounter (HOSPITAL_COMMUNITY)
Admission: RE | Disposition: A | Discharge status: Home / Self Care | Payer: Self-pay | Source: Ambulatory Visit | Attending: Internal Medicine

## 2013-09-18 ENCOUNTER — Encounter (HOSPITAL_COMMUNITY): Payer: Self-pay | Admitting: *Deleted

## 2013-09-18 ENCOUNTER — Ambulatory Visit (HOSPITAL_COMMUNITY)
Admission: RE | Admit: 2013-09-18 | Discharge: 2013-09-18 | Disposition: A | Discharge status: Home / Self Care | Payer: BC Managed Care – PPO | Source: Ambulatory Visit | Attending: Internal Medicine | Admitting: Internal Medicine

## 2013-09-18 DIAGNOSIS — K219 Gastro-esophageal reflux disease without esophagitis: Secondary | ICD-10-CM | POA: Insufficient documentation

## 2013-09-18 DIAGNOSIS — I10 Essential (primary) hypertension: Secondary | ICD-10-CM | POA: Insufficient documentation

## 2013-09-18 DIAGNOSIS — F411 Generalized anxiety disorder: Secondary | ICD-10-CM | POA: Insufficient documentation

## 2013-09-18 DIAGNOSIS — E782 Mixed hyperlipidemia: Secondary | ICD-10-CM | POA: Insufficient documentation

## 2013-09-18 DIAGNOSIS — F17200 Nicotine dependence, unspecified, uncomplicated: Secondary | ICD-10-CM | POA: Insufficient documentation

## 2013-09-18 DIAGNOSIS — Z86010 Personal history of colonic polyps: Secondary | ICD-10-CM

## 2013-09-18 DIAGNOSIS — F410 Panic disorder [episodic paroxysmal anxiety] without agoraphobia: Secondary | ICD-10-CM | POA: Insufficient documentation

## 2013-09-18 DIAGNOSIS — Z79899 Other long term (current) drug therapy: Secondary | ICD-10-CM | POA: Insufficient documentation

## 2013-09-18 DIAGNOSIS — D126 Benign neoplasm of colon, unspecified: Secondary | ICD-10-CM

## 2013-09-18 DIAGNOSIS — E039 Hypothyroidism, unspecified: Secondary | ICD-10-CM | POA: Insufficient documentation

## 2013-09-18 HISTORY — PX: COLONOSCOPY: SHX5424

## 2013-09-18 SURGERY — COLONOSCOPY
Anesthesia: Moderate Sedation

## 2013-09-18 MED ORDER — STERILE WATER FOR IRRIGATION IR SOLN
Status: DC | PRN
  Administered 2013-09-18: 11:00:00

## 2013-09-18 MED ORDER — MEPERIDINE HCL 100 MG/ML IJ SOLN
INTRAMUSCULAR | Status: AC
  Filled 2013-09-18: qty 2

## 2013-09-18 MED ORDER — MEPERIDINE HCL 50 MG/ML IJ SOLN
INTRAMUSCULAR | Status: AC
  Filled 2013-09-18: qty 1

## 2013-09-18 MED ORDER — MEPERIDINE HCL 100 MG/ML IJ SOLN
INTRAMUSCULAR | Status: DC | PRN
  Administered 2013-09-18: 25 mg via INTRAVENOUS
  Administered 2013-09-18 (×2): 50 mg via INTRAVENOUS

## 2013-09-18 MED ORDER — SODIUM CHLORIDE 0.9 % IV SOLN
INTRAVENOUS | Status: DC
  Administered 2013-09-18: 10:00:00 via INTRAVENOUS

## 2013-09-18 MED ORDER — ONDANSETRON HCL 4 MG/2ML IJ SOLN
INTRAMUSCULAR | Status: DC | PRN
  Administered 2013-09-18: 4 mg via INTRAVENOUS

## 2013-09-18 MED ORDER — SODIUM CHLORIDE 0.9 % IJ SOLN
INTRAMUSCULAR | Status: AC
Start: 2013-09-18 — End: 2013-09-18
  Filled 2013-09-18: qty 10

## 2013-09-18 MED ORDER — MIDAZOLAM HCL 5 MG/5ML IJ SOLN
INTRAMUSCULAR | Status: DC | PRN
  Administered 2013-09-18: 1 mg via INTRAVENOUS
  Administered 2013-09-18: 2 mg via INTRAVENOUS
  Administered 2013-09-18: 1 mg via INTRAVENOUS
  Administered 2013-09-18: 2 mg via INTRAVENOUS

## 2013-09-18 MED ORDER — ONDANSETRON HCL 4 MG/2ML IJ SOLN
INTRAMUSCULAR | Status: AC
  Filled 2013-09-18: qty 2

## 2013-09-18 MED ORDER — PROMETHAZINE HCL 25 MG/ML IJ SOLN
INTRAMUSCULAR | Status: AC
Start: 2013-09-18 — End: 2013-09-18
  Filled 2013-09-18: qty 1

## 2013-09-18 MED ORDER — PROMETHAZINE HCL 25 MG/ML IJ SOLN
12.5000 mg | Freq: Once | INTRAMUSCULAR | Status: AC
Start: 2013-09-18 — End: 2013-09-18
  Administered 2013-09-18: 12.5 mg via INTRAVENOUS

## 2013-09-18 MED ORDER — MIDAZOLAM HCL 5 MG/5ML IJ SOLN
INTRAMUSCULAR | Status: AC
  Filled 2013-09-18: qty 10

## 2013-09-18 NOTE — Discharge Instructions (Signed)

## 2013-09-18 NOTE — H&P (View-Only) (Signed)
Referring Kaitlin [name]: Kaitlin Kirschner, MD Primary Care Physician:  Kaitlin Battiest, MD Primary GI: Dr. Gala [name]   Chief Complaint  Patient presents with  . Colonoscopy    HPI:   Kaitlin [name] presents today for a visit prior to surveillance colonoscopy secondary to history of adenomatous polyps. Last colonoscopy in 2012 with tubulovillous and tubular adenomas. History of chronic constipation. Taking Miralax daily. BM every other day. Occasional abdominal discomfort improved after BM. No rectal bleeding. Rare indigestion. Nexium OTC prn. No melena. Wants to stay with Miralax instead of trying an alternative.   Past Medical History  Diagnosis Date  . BMI 30.0-30.9,adult 2008 182 LBS    2009 194 LBS  . Helicobacter pylori gastritis AUG 2008 PREVPAC: nausea-->HELIDAC: GI UPSET    HB 14.1 NL HFP H. PYLORI STOOL Ag NEG  . Colon polyp APR 2008  . Anxiety   . Essential hypertension, benign   . Hypothyroidism   . Mixed hyperlipidemia   . Irritable bowel syndrome   . Tubulovillous adenoma 08/07/10    Colonoscopy w/ Dr Oneida Alar w/ 7 polyps removed-bx showed tubular adenomas.  Largest 1cm-hepatic flexure polyp->TA.    Marland Kitchen Panic attacks   . Reflux   . Kidney stones   . Depression   . Folliculitis 7/42/5956  . Mixed stress and urge urinary incontinence 09/26/2012    Past Surgical History  Procedure Laterality Date  . Colonoscopy  APR 2008    AC/[place] SIMPLE ADENOMA  . Upper gastrointestinal endoscopy  AUG 2008 PAIN/NAUSEA    H. PYLORI treated  . Cholecystectomy    . Mass excision  02/28/2011    Procedure: EXCISION MASS;  Surgeon: Donato Heinz, MD;  Location: [facility];  Service: General;  Laterality: Right;  soft tissue mass  . Colonoscopy  08/07/2010    Dr. Raynald Kemp adenomas and tubulovillous adenoma; needs surveillance 2015    Current Outpatient Prescriptions  Medication Sig Dispense Refill  . ALPRAZolam (XANAX) 1 MG tablet TAKE 1 TABLET 3 TIMES A DAY  90 tablet  5  . enalapril  (VASOTEC) 20 MG tablet TAKE 1 TABLET AT BEDTIME  30 tablet  5  . ibuprofen (ADVIL,MOTRIN) 800 MG tablet Take 1 tablet (800 mg total) by mouth 3 (three) times daily.  21 tablet  0  . indapamide (LOZOL) 1.25 MG tablet TAKE 1 TABLET BY MOUTH IN THE MORNING  30 tablet  5  . levothyroxine (SYNTHROID, LEVOTHROID) 50 MCG tablet TAKE 1 TABLET EVERY DAY  30 tablet  5  . lovastatin (MEVACOR) 40 MG tablet TAKE 1 TABLET AT BEDTIME  30 tablet  4  . verapamil (CALAN-SR) 240 MG CR tablet TAKE 1 TABLET BY MOUTH EVERY DAY  30 tablet  5  . polyethylene glycol-electrolytes (TRILYTE) 420 G solution Take 4,000 mLs by mouth as directed.  4000 mL  0   No current facility-administered medications for this visit.    Allergies as of 08/20/2013 - Review Complete 08/20/2013  Allergen Reaction Noted  . Asa [aspirin]  06/09/2012  . Augmentin [amoxicillin-pot clavulanate]  06/09/2012  . Levaquin [levofloxacin in d5w]  07/14/2012  . Rocephin [ceftriaxone sodium in dextrose]  06/09/2012  . Valtrex [valacyclovir hcl]  06/09/2012  . Zoloft [sertraline hcl]  06/09/2012    Family History  Problem Relation Age of Onset  . Colon cancer Neg Hx   . Anesthesia problems Neg Hx   . Hypotension Neg Hx   . Malignant hyperthermia Neg Hx   .  Pseudochol deficiency Neg Hx   . Coronary artery disease Mother   . Hypertension Mother   . Heart attack Mother   . Heart disease Mother   . Coronary artery disease Brother     Age 32  . Heart attack Brother   . Heart disease Brother   . Diabetes Sister   . Diabetes Sister     History   Social History  . Marital Status: Married    Spouse Name: N/A    Number of Children: N/A  . Years of Education: N/A   Social History Main Topics  . Smoking status: Never Smoker   . Smokeless tobacco: Never Used     Comment: Never Smoked  . Alcohol Use: No  . Drug Use: No  . Sexual Activity: No   Other Topics Concern  . None   Social History Narrative  . None    Review of  Systems: Negative unless mentioned in HPI  Physical Exam: BP 126/78  Pulse 87  Temp(Src) 99 F (37.2 C) (Oral)  Ht 5\' 5"  (1.651 m)  Wt 202 lb 6.4 oz (91.808 kg)  BMI 33.68 kg/m2 General:   Alert and oriented. No distress noted. Pleasant and cooperative.  Head:  Normocephalic and atraumatic. Eyes:  Conjuctiva clear without scleral icterus. Mouth:  Oral mucosa pink and moist. Good dentition. No lesions. Heart:  S1, S2 present without murmurs, rubs, or gallops. Regular rate and rhythm. Abdomen:  +BS, soft, non-tender and non-distended. No rebound or guarding. No HSM or masses noted. Msk:  Symmetrical without gross deformities. Normal posture. Extremities:  Without edema. Neurologic:  Alert and  oriented x4;  grossly normal neurologically. Skin:  Intact without significant lesions or rashes. Psych:  Alert and cooperative. Normal mood and affect.

## 2013-09-18 NOTE — Op Note (Signed)
[facility] [address] [place], [address]   COLONOSCOPY PROCEDURE REPORT  PATIENT: Kaitlin [name], Kaitlin [name].  MR#:         [id number] BIRTHDATE: [birth year] , 57  yrs. old GENDER: Female ENDOSCOPIST: R.  Garfield Cornea, MD FACP [facility] REFERRED BY:  Rosemary Holms, M.D. PROCEDURE DATE:  09/18/2013 PROCEDURE:     Colonoscopy with biopsy  INDICATIONS: history of colonic adenoma  INFORMED CONSENT:  The risks, benefits, alternatives and imponderables including but not limited to bleeding, perforation as well as the possibility of a missed lesion have been reviewed.  The potential for biopsy, lesion removal, etc. have also been discussed.  Questions have been answered.  All parties agreeable. Please see the history and physical in the medical record for more information.  MEDICATIONS: Versed 6 mg IV and Demerol 125 mg IV in divided doses. Phenergan 12.5 mg IV.  DESCRIPTION OF PROCEDURE:  After a digital rectal exam was performed, the EC-[id number] ([id number])  colonoscope was advanced from the anus through the rectum and colon to the area of the cecum, ileocecal valve and appendiceal orifice.  The cecum was deeply intubated.  These structures were well-seen and photographed for the record.  From the level of the cecum and ileocecal valve, the scope was slowly and cautiously withdrawn.  The mucosal surfaces were carefully surveyed utilizing scope tip deflection to facilitate fold flattening as needed.  The scope was pulled down into the rectum where a thorough examination including retroflexion was performed.    FINDINGS:  Adequate preparation.  Normal rectum. (2) diminutive polyps in the mid sigmoid segment; otherwise, the remainder of colonic mucosa appeared normal.  THERAPEUTIC / DIAGNOSTIC MANEUVERS PERFORMED:  The above-mentioned polyps were cold biopsied/removed  COMPLICATIONS: None  CECAL WITHDRAWAL TIME:  11 minutes  IMPRESSION:  Colonic polyps removed as  described above  RECOMMENDATIONS: Followup on pathology.   _______________________________ eSigned:  R. Garfield Cornea, MD FACP [facility] 09/18/2013 11:44 AM   CC:

## 2013-09-18 NOTE — Interval H&P Note (Signed)
History and Physical Interval Note:  09/18/2013 11:04 AM  Kaitlin [name]  has presented today for surgery, with the diagnosis of ADENOMATOUS POLYP  The various methods of treatment have been discussed with the patient and family. After consideration of risks, benefits and other options for treatment, the patient has consented to  Procedure(s) with comments: COLONOSCOPY (N/A) - 10:45 as a surgical intervention .  The patient's history has been reviewed, patient examined, no change in status, stable for surgery.  I have reviewed the patient's chart and labs.  Questions were answered to the patient's satisfaction.     No change. Colonoscopy per plan.The risks, benefits, limitations, alternatives and imponderables have been reviewed with the patient. Questions have been answered. All parties are agreeable.   Kaitlin [name]

## 2013-09-22 ENCOUNTER — Encounter: Payer: Self-pay | Admitting: Internal Medicine

## 2013-09-23 ENCOUNTER — Encounter (HOSPITAL_COMMUNITY): Payer: Self-pay | Admitting: Internal Medicine

## 2013-09-29 ENCOUNTER — Other Ambulatory Visit: Payer: Self-pay | Admitting: Family Medicine

## 2013-10-01 ENCOUNTER — Telehealth: Payer: Self-pay | Admitting: Family Medicine

## 2013-10-01 NOTE — Telephone Encounter (Signed)
error 

## 2013-10-02 ENCOUNTER — Encounter: Payer: Self-pay | Admitting: Family Medicine

## 2013-10-02 ENCOUNTER — Ambulatory Visit (INDEPENDENT_AMBULATORY_CARE_PROVIDER_SITE_OTHER): Payer: BC Managed Care – PPO | Admitting: Family Medicine

## 2013-10-02 VITALS — BP 140/90 | Ht 66.0 in | Wt 199.6 lb

## 2013-10-02 DIAGNOSIS — T675XXA Heat exhaustion, unspecified, initial encounter: Secondary | ICD-10-CM

## 2013-10-02 NOTE — Progress Notes (Signed)
   Subjective:    Patient ID: Kaitlin [name], female    DOB: 04/01/1955, 58 y.o.   MRN: [id number]  HPI  Patient arrives with complaint of headache- got very hot at work yest and her BP was 168/89 and 161/91. Patient went home and rested and still has headache today and wonders if she had a heat stroke.   Numbers were elevated. Pt developed a headache. Took some tylenol  felt bad and achey.,  Pt took it easy yest  Pt thinks she had "heat stroke"" Pt felt out of it, felt a bit concerned     Review of Systems No loss of consciousness no fever no nausea no vomiting ROS otherwise negative    Objective:   Physical Exam Alert mild malaise hydration adequate today blood pressure 142/86. Lungs clear. Heart regular in rhythm.       Assessment & Plan:  Impression 1 hypertension #2 heat exhaustion discussed plan work excuse given. Hydration discussed. Avoidance measures discussed. WSL

## 2013-10-20 ENCOUNTER — Telehealth: Payer: Self-pay | Admitting: Family Medicine

## 2013-10-20 DIAGNOSIS — Z8639 Personal history of other endocrine, nutritional and metabolic disease: Secondary | ICD-10-CM

## 2013-10-20 NOTE — Telephone Encounter (Signed)
Patient said she would like to be referred to someone else instead of Dr. Dorris Fetch. Dr. Dorris Fetch believes that patient needs surgery and she doesn't like the way that he is treating her case. She would like a 2nd opinion. She said she feels she would like to be sent to someone not in [place].

## 2013-10-20 NOTE — Telephone Encounter (Signed)
Referral for Endocrinology in [place] placed in [place]. Patient notified.

## 2013-10-20 NOTE — Telephone Encounter (Signed)
endocrin in [place], re parathyroid disease

## 2013-11-02 ENCOUNTER — Encounter: Payer: Self-pay | Admitting: Family Medicine

## 2013-11-02 ENCOUNTER — Ambulatory Visit (INDEPENDENT_AMBULATORY_CARE_PROVIDER_SITE_OTHER): Payer: BC Managed Care – PPO | Admitting: Family Medicine

## 2013-11-02 VITALS — BP 118/80 | Temp 98.3°F | Ht 65.0 in | Wt 200.0 lb

## 2013-11-02 DIAGNOSIS — J329 Chronic sinusitis, unspecified: Secondary | ICD-10-CM

## 2013-11-02 DIAGNOSIS — E349 Endocrine disorder, unspecified: Secondary | ICD-10-CM

## 2013-11-02 DIAGNOSIS — J310 Chronic rhinitis: Secondary | ICD-10-CM

## 2013-11-02 DIAGNOSIS — Z0289 Encounter for other administrative examinations: Secondary | ICD-10-CM

## 2013-11-02 MED ORDER — BENZONATATE 100 MG PO CAPS: 100.0000 mg | ORAL_CAPSULE | Freq: Two times a day (BID) | ORAL | Status: DC | PRN

## 2013-11-02 MED ORDER — DOXYCYCLINE HYCLATE 100 MG PO TABS: 100.0000 mg | ORAL_TABLET | Freq: Two times a day (BID) | ORAL | Status: DC

## 2013-11-02 NOTE — Progress Notes (Signed)
   Subjective:    Patient ID: Kaitlin [name], female    DOB: 1955-12-09, 58 y.o.   MRN: [id number]  Cough This is a new problem. The current episode started in the past 7 days. Associated symptoms include headaches and myalgias. Associated symptoms comments: Runny nose, sneezing, chills, ear pain, tooth pain.    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  Review of Systems  Respiratory: Positive for cough.   Musculoskeletal: Positive for myalgias.  Neurological: Positive for headaches.       Objective:   Physical Exam  Alert mild malaise. Vital stable. HEENT moderate nasal congestion pharynx normal neck supple. Lungs bronchial cough heart regular in rhythm.      Assessment & Plan:  Rhinosinusitis/bronchitis plan antibiotics prescribed. Symptomatic care discussed. WSL

## 2013-11-12 ENCOUNTER — Telehealth: Payer: Self-pay | Admitting: Internal Medicine

## 2013-11-12 MED ORDER — LINACLOTIDE 145 MCG PO CAPS: 145.0000 ug | ORAL_CAPSULE | Freq: Every day | ORAL | Status: DC

## 2013-11-12 NOTE — Telephone Encounter (Signed)
I spoke with the pt. She is having episodes of constipation that lasts 3-4 days even after taking the miralax daily. She would like to try something else. Vicente Males said she would send in [place] to her pharmacy.  Her neighbor who is 57 years old and just found out he has stomach cancer and didn't have any symptoms and she is concerned about herself.

## 2013-11-12 NOTE — Addendum Note (Signed)
Addended by: Orvil Feil on: 11/12/2013 04:08 PM   Modules accepted: Orders

## 2013-11-12 NOTE — Telephone Encounter (Signed)
Linzess 145 sent to pharmacy.

## 2013-11-12 NOTE — Telephone Encounter (Signed)
I also reviewed patients tcs that was done in July with the patient.

## 2013-11-12 NOTE — Telephone Encounter (Signed)
Pt said that she had called earlier. I didn't see a phone note, so she may have left a VM. She is having problems with constipation and has concerns about that. 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 Please advise and call her before 330pm because she is getting ready to lay down. [phone]

## 2013-11-13 ENCOUNTER — Encounter: Payer: Self-pay | Admitting: Internal Medicine

## 2013-11-13 ENCOUNTER — Ambulatory Visit: Payer: BC Managed Care – PPO | Admitting: Internal Medicine

## 2013-11-16 ENCOUNTER — Telehealth: Payer: Self-pay | Admitting: Internal Medicine

## 2013-11-16 NOTE — Telephone Encounter (Signed)
Pt was calling back to see when RMR wanted to see her again, She hasn't started taking the [place] yet, but has picked it up. Please advise and call her sometime today before 330pm (she works 3rd and goes to bed at that time)

## 2013-11-16 NOTE — Telephone Encounter (Signed)
Dr.Rourk- when do you want this pt to follow up with [name]? I cannot find any documentation of when you want her to come back.

## 2013-11-18 ENCOUNTER — Ambulatory Visit (INDEPENDENT_AMBULATORY_CARE_PROVIDER_SITE_OTHER): Payer: BC Managed Care – PPO | Admitting: Endocrinology

## 2013-11-18 ENCOUNTER — Encounter: Payer: Self-pay | Admitting: Endocrinology

## 2013-11-18 ENCOUNTER — Encounter: Payer: Self-pay | Admitting: Internal Medicine

## 2013-11-18 VITALS — BP 118/70 | HR 69 | Temp 97.3°F | Ht 65.0 in | Wt 198.0 lb

## 2013-11-18 DIAGNOSIS — E349 Endocrine disorder, unspecified: Secondary | ICD-10-CM

## 2013-11-18 MED ORDER — FUROSEMIDE 20 MG PO TABS: ORAL_TABLET | ORAL | Status: DC

## 2013-11-18 NOTE — Patient Instructions (Addendum)
Please change to a different fluid pill, to see if that helps the calcium level.   Please come back for a follow-up appointment in 2-4 weeks.   It is possible that you would need surgery for this, but we need to understand it better first.

## 2013-11-18 NOTE — Telephone Encounter (Signed)
APPOINTMENT MADE AND LETTER SENT, 5 YR COLONOSCOPY IS NIC'D

## 2013-11-18 NOTE — Progress Notes (Signed)
Subjective:    Patient ID: Kaitlin [name], female    DOB: 11-02-1955, 58 y.o.   MRN: [id number]  HPI Pt was first noted to have hypercalcemia in 2012.  she has never had PUD, pancreatitis, or bony fracture.  she does not take vitamin-D or A supplements.  She had resection of a urolith in approx 2000.  She has h/o depression--much better now.   Past Medical History  Diagnosis Date  . BMI 30.0-30.9,adult 2008 182 LBS    2009 194 LBS  . Helicobacter pylori gastritis AUG 2008 PREVPAC: nausea-->HELIDAC: GI UPSET    HB 14.1 NL HFP H. PYLORI STOOL Ag NEG  . Colon polyp APR 2008  . Anxiety   . Essential hypertension, benign   . Hypothyroidism   . Mixed hyperlipidemia   . Irritable bowel syndrome   . Tubulovillous adenoma 08/07/10    Colonoscopy w/ Dr Oneida Alar w/ 7 polyps removed-bx showed tubular adenomas.  Largest 1cm-hepatic flexure polyp->TA.    Marland Kitchen Panic attacks   . Reflux   . Kidney stones   . Depression   . Folliculitis 0/93/8182  . Mixed stress and urge urinary incontinence 09/26/2012  . Polyp of colon, hyperplastic     Past Surgical History  Procedure Laterality Date  . Colonoscopy  APR 2008    AC/[place] SIMPLE ADENOMA  . Upper gastrointestinal endoscopy  AUG 2008 PAIN/NAUSEA    H. PYLORI treated  . Cholecystectomy    . Mass excision  02/28/2011    Procedure: EXCISION MASS;  Surgeon: Donato Heinz, MD;  Location: [facility];  Service: General;  Laterality: Right;  soft tissue mass  . Colonoscopy  08/07/2010    Dr. Raynald Kemp adenomas and tubulovillous adenoma; needs surveillance 2015  . Colonoscopy N/A 09/18/2013    Dr.Rourk- normal rectum, 2 diminutive polys in the mid sigmoid segment o/w the remainder of the colonic mucosa appeared normal.hyperplastic poylps    History   Social History  . Marital Status: Married    Spouse Name: N/A    Number of Children: N/A  . Years of Education: N/A   Occupational History  . Not on file.   Social History Main Topics  . Smoking  status: Never Smoker   . Smokeless tobacco: Never Used     Comment: Never Smoked  . Alcohol Use: No  . Drug Use: No  . Sexual Activity: No   Other Topics Concern  . Not on file   Social History Narrative  . No narrative on file    Current Outpatient Prescriptions on File Prior to Visit  Medication Sig Dispense Refill  . ALPRAZolam (XANAX) 1 MG tablet Take 1 mg by mouth 3 (three) times daily.      . benzonatate (TESSALON) 100 MG capsule Take 1 capsule (100 mg total) by mouth 2 (two) times daily as needed for cough.  20 capsule  0  . doxycycline (VIBRA-TABS) 100 MG tablet Take 1 tablet (100 mg total) by mouth 2 (two) times daily.  20 tablet  0  . enalapril (VASOTEC) 20 MG tablet Take 20 mg by mouth at bedtime.      Marland Kitchen levothyroxine (SYNTHROID, LEVOTHROID) 50 MCG tablet Take 50 mcg by mouth daily before breakfast.      . Linaclotide (LINZESS) 145 MCG CAPS capsule Take 1 capsule (145 mcg total) by mouth daily. 30 minutes before breakfast on an empty stomach  30 capsule  5  . lovastatin (MEVACOR) 40 MG tablet TAKE 1 TABLET AT BEDTIME  30 tablet  4  . polyethylene glycol-electrolytes (TRILYTE) 420 G solution Take 4,000 mLs by mouth as directed.  4000 mL  0  . verapamil (CALAN-SR) 240 MG CR tablet Take 240 mg by mouth daily.       No current facility-administered medications on file prior to visit.    Allergies  Allergen Reactions  . Asa [Aspirin]   . Augmentin [Amoxicillin-Pot Clavulanate]   . Levaquin [Levofloxacin In D5w]     chestpain  . Rocephin [Ceftriaxone Sodium In Dextrose]   . Valtrex [Valacyclovir Hcl]   . Zoloft [Sertraline Hcl]     Family History  Problem Relation Age of Onset  . Colon cancer Neg Hx   . Anesthesia problems Neg Hx   . Hypotension Neg Hx   . Malignant hyperthermia Neg Hx   . Pseudochol deficiency Neg Hx   . Coronary artery disease Mother   . Hypertension Mother   . Heart attack Mother   . Heart disease Mother   . Coronary artery disease Brother       Age 4  . Heart attack Brother   . Heart disease Brother   . Diabetes Sister   . Diabetes Sister     BP 118/70  Pulse 69  Temp(Src) 97.3 F (36.3 C) (Oral)  Ht 5\' 5"  (1.651 m)  Wt 198 lb (89.812 kg)  BMI 32.95 kg/m2  SpO2 96%  Review of Systems denies galactorrhea, hematuria, numbness, abdominal pain, muscle weakness,skin rash, visual loss, sob, diarrhea, rhinorrhea, easy bruising, and depression.  She has menopausal sxs and arthralgias. She has lost a few lbs.  She attributes urinary frequency to lozol.      Objective:   Physical Exam VS: see vs page GEN: no distress HEAD: head: no deformity eyes: no periorbital swelling, no proptosis external nose and ears are normal mouth: no lesion seen NECK: supple, thyroid is not enlarged CHEST WALL: no deformity LUNGS:  Clear to auscultation. CV: reg rate and rhythm, no murmur ABD: abdomen is soft, nontender.  no hepatosplenomegaly.  not distended.  no hernia MUSCULOSKELETAL: muscle bulk and strength are grossly normal.  no obvious joint swelling.  gait is normal and steady EXTEMITIES: no deformity.  no ulcer on the feet.  feet are of normal color and temp.  no edema PULSES: dorsalis pedis intact bilat.  no carotid bruit NEURO:  cn 2-12 grossly intact.   readily moves all 4's.  sensation is intact to touch on the feet SKIN:  Normal texture and temperature.  No rash or suspicious lesion is visible.   NODES:  None palpable at the neck PSYCH: alert, well-oriented.  Does not appear anxious nor depressed.   Radiol: i reviewed dexa (2015): normal.    Lab Results  Component Value Date   TSH 3.423 05/16/2013   Lab Results  Component Value Date   PTH 91.4* 05/22/2013   CALCIUM 10.6* 05/22/2013   i have reviewed the following outside records: Office notes    Assessment & Plan:  Hypercalcemia, new, probably due to hyperparathyroidism.  Hyperparathyroidism: probably primary, but vit-D deficiency should be excluded.  HTN: diuretic  rx could contribute to hypercalcemia.  Urolithiasis: uncertain how or if this was related to hyperparathyroidism.     Patient is advised the following: Patient Instructions  Please change to a different fluid pill, to see if that helps the calcium level.   Please come back for a follow-up appointment in 2-4 weeks.   It is possible that you would need  surgery for this, but we need to understand it better first.

## 2013-11-18 NOTE — Telephone Encounter (Signed)
She missed her appointment. She can come back and see the extender 3 months from now. Next colonoscopy 5 years from now.

## 2013-12-02 ENCOUNTER — Ambulatory Visit (INDEPENDENT_AMBULATORY_CARE_PROVIDER_SITE_OTHER): Payer: BC Managed Care – PPO | Admitting: Endocrinology

## 2013-12-02 ENCOUNTER — Encounter: Payer: Self-pay | Admitting: Endocrinology

## 2013-12-02 VITALS — BP 132/74 | HR 67 | Temp 97.6°F | Ht 65.0 in | Wt 202.0 lb

## 2013-12-02 DIAGNOSIS — E349 Endocrine disorder, unspecified: Secondary | ICD-10-CM

## 2013-12-02 DIAGNOSIS — R7989 Other specified abnormal findings of blood chemistry: Secondary | ICD-10-CM

## 2013-12-02 DIAGNOSIS — E039 Hypothyroidism, unspecified: Secondary | ICD-10-CM

## 2013-12-02 LAB — BASIC METABOLIC PANEL
BUN: 14 mg/dL (ref 6–23)
CALCIUM: 10.8 mg/dL — AB (ref 8.4–10.5)
CO2: 19 mEq/L (ref 19–32)
Chloride: 105 mEq/L (ref 96–112)
Creat: 0.71 mg/dL (ref 0.50–1.10)
GLUCOSE: 88 mg/dL (ref 70–99)
POTASSIUM: 4.6 meq/L (ref 3.5–5.3)
Sodium: 142 mEq/L (ref 135–145)

## 2013-12-02 LAB — TSH: TSH: 3.89 u[IU]/mL (ref 0.350–4.500)

## 2013-12-02 NOTE — Patient Instructions (Signed)
blood tests are being requested for you today.  We'll contact you with results.   It is possible that you would need surgery for this, but we need to understand it better first.

## 2013-12-02 NOTE — Progress Notes (Signed)
Subjective:    Patient ID: Kaitlin [name], female    DOB: 09/01/1955, 58 y.o.   MRN: [id number]  HPI The state of at least three ongoing medical problems is addressed today, with interval history of each noted here: Hyperparathyroidism: she does not take vitamin-D or A supplements.  She had resection of a urolith in approx 2000.  Denies muscle cramps. Hypercalcemia: (first noted in 2012; she has never had PUD, pancreatitis, or bony fracture; DEXA was normal in early 2015; in sept of 2015, lozol was changed to lasix).  She has urinary frequency. HTN: she reports excessive diaphoresis.  Past Medical History  Diagnosis Date  . BMI 30.0-30.9,adult 2008 182 LBS    2009 194 LBS  . Helicobacter pylori gastritis AUG 2008 PREVPAC: nausea-->HELIDAC: GI UPSET    HB 14.1 NL HFP H. PYLORI STOOL Ag NEG  . Colon polyp APR 2008  . Anxiety   . Essential hypertension, benign   . Hypothyroidism   . Mixed hyperlipidemia   . Irritable bowel syndrome   . Tubulovillous adenoma 08/07/10    Colonoscopy w/ Dr Oneida Alar w/ 7 polyps removed-bx showed tubular adenomas.  Largest 1cm-hepatic flexure polyp->TA.    Marland Kitchen Panic attacks   . Reflux   . Kidney stones   . Depression   . Folliculitis 0/17/4944  . Mixed stress and urge urinary incontinence 09/26/2012  . Polyp of colon, hyperplastic     Past Surgical History  Procedure Laterality Date  . Colonoscopy  APR 2008    AC/[place] SIMPLE ADENOMA  . Upper gastrointestinal endoscopy  AUG 2008 PAIN/NAUSEA    H. PYLORI treated  . Cholecystectomy    . Mass excision  02/28/2011    Procedure: EXCISION MASS;  Surgeon: Donato Heinz, MD;  Location: [facility];  Service: General;  Laterality: Right;  soft tissue mass  . Colonoscopy  08/07/2010    Dr. Raynald Kemp adenomas and tubulovillous adenoma; needs surveillance 2015  . Colonoscopy N/A 09/18/2013    Dr.Rourk- normal rectum, 2 diminutive polys in the mid sigmoid segment o/w the remainder of the colonic mucosa appeared  normal.hyperplastic poylps    History   Social History  . Marital Status: Married    Spouse Name: N/A    Number of Children: N/A  . Years of Education: N/A   Occupational History  . Not on file.   Social History Main Topics  . Smoking status: Never Smoker   . Smokeless tobacco: Never Used     Comment: Never Smoked  . Alcohol Use: No  . Drug Use: No  . Sexual Activity: No   Other Topics Concern  . Not on file   Social History Narrative  . No narrative on file    Current Outpatient Prescriptions on File Prior to Visit  Medication Sig Dispense Refill  . ALPRAZolam (XANAX) 1 MG tablet Take 1 mg by mouth 3 (three) times daily.      . benzonatate (TESSALON) 100 MG capsule Take 1 capsule (100 mg total) by mouth 2 (two) times daily as needed for cough.  20 capsule  0  . doxycycline (VIBRA-TABS) 100 MG tablet Take 1 tablet (100 mg total) by mouth 2 (two) times daily.  20 tablet  0  . enalapril (VASOTEC) 20 MG tablet Take 20 mg by mouth at bedtime.      . furosemide (LASIX) 20 MG tablet 1/2 tab daily  30 tablet  3  . levothyroxine (SYNTHROID, LEVOTHROID) 50 MCG tablet Take 50 mcg by  mouth daily before breakfast.      . Linaclotide (LINZESS) 145 MCG CAPS capsule Take 1 capsule (145 mcg total) by mouth daily. 30 minutes before breakfast on an empty stomach  30 capsule  5  . lovastatin (MEVACOR) 40 MG tablet TAKE 1 TABLET AT BEDTIME  30 tablet  4  . polyethylene glycol-electrolytes (TRILYTE) 420 G solution Take 4,000 mLs by mouth as directed.  4000 mL  0  . verapamil (CALAN-SR) 240 MG CR tablet Take 240 mg by mouth daily.       No current facility-administered medications on file prior to visit.    Allergies  Allergen Reactions  . Asa [Aspirin]   . Augmentin [Amoxicillin-Pot Clavulanate]   . Levaquin [Levofloxacin In D5w]     chestpain  . Rocephin [Ceftriaxone Sodium In Dextrose]   . Valtrex [Valacyclovir Hcl]   . Zoloft [Sertraline Hcl]     Family History  Problem  Relation Age of Onset  . Colon cancer Neg Hx   . Anesthesia problems Neg Hx   . Hypotension Neg Hx   . Malignant hyperthermia Neg Hx   . Pseudochol deficiency Neg Hx   . Coronary artery disease Mother   . Hypertension Mother   . Heart attack Mother   . Heart disease Mother   . Coronary artery disease Brother     Age 53  . Heart attack Brother   . Heart disease Brother   . Diabetes Sister   . Diabetes Sister     BP 132/74  Pulse 67  Temp(Src) 97.6 F (36.4 C) (Oral)  Ht 5\' 5"  (1.651 m)  Wt 202 lb (91.627 kg)  BMI 33.61 kg/m2  SpO2 97%   Review of Systems She has arthralgias.  Denies sob.    Objective:   Physical Exam VITAL SIGNS:  See vs page. GENERAL: no distress. Ext: trace bilat leg edema  25-OH vit-D=18 Lab Results  Component Value Date   PTH 59 12/02/2013   CALCIUM 10.8* 12/02/2013   CALCIUM 10.8* 12/02/2013      Assessment & Plan:  Hyperparathyroidism, better with change of diuretic rx, but cause and effect is uncertain Hypercalcemia, persistent, uncertain etiology Vit-D deficiency, new HTN: well-controlled Excessive diaphoresis: uncertain etiology   Patient is advised the following: Patient Instructions  blood tests are being requested for you today.  We'll contact you with results.   It is possible that you would need surgery for this, but we need to understand it better first.    addendum: i rx'ed ergocalciferol.

## 2013-12-03 ENCOUNTER — Telehealth: Payer: Self-pay | Admitting: Endocrinology

## 2013-12-03 LAB — PTH, INTACT AND CALCIUM
Calcium: 10.8 mg/dL — ABNORMAL HIGH (ref 8.4–10.5)
PTH: 59 pg/mL (ref 14–64)

## 2013-12-03 LAB — VITAMIN D 25 HYDROXY (VIT D DEFICIENCY, FRACTURES): VIT D 25 HYDROXY: 18 ng/mL — AB (ref 30–89)

## 2013-12-03 MED ORDER — VITAMIN D (ERGOCALCIFEROL) 1.25 MG (50000 UNIT) PO CAPS: 50000.0000 [IU] | ORAL_CAPSULE | ORAL | Status: DC

## 2013-12-03 NOTE — Telephone Encounter (Signed)
Correction: twice a week.

## 2013-12-04 NOTE — Telephone Encounter (Signed)
Pt advised of instructions below for Drisdol.

## 2013-12-11 ENCOUNTER — Telehealth: Payer: Self-pay | Admitting: Family Medicine

## 2013-12-11 NOTE — Telephone Encounter (Signed)
Patient had a really bad headache yesterday and while she was at her sons football game she had a severe nose bleed that was "gushing blood".  She said she doesn't know how high her BP was, but an ambulance had to be called. Please advise. Patient said that she wanted to know what Dr. Nicki Reaper thinks about this.

## 2013-12-11 NOTE — Telephone Encounter (Signed)
Patient states she is not currently having a nose bleed-she has a lingering headache. Consult with Dr Nicki Reaper: Advise patient to take it easy this weekend, low salt diet, saline nasal spray and Vaseline to nose-humidifier and follow up office visit next week. If nose bleed returns head straight to ER. Patient verbalized understanding.

## 2013-12-11 NOTE — Telephone Encounter (Signed)
Nurses, called the patient. Discussed with her how she is feeling currently. Headache? Hopefully she has some idea of what the ambulance/EMS personnel told her? If she had a nose bleed currently? She may end up needing to be seen. 2 many loose ends for me to predict based on this alone.

## 2013-12-24 ENCOUNTER — Other Ambulatory Visit: Payer: Self-pay | Admitting: Family Medicine

## 2013-12-24 NOTE — Telephone Encounter (Signed)
Ok plus five monthly ref 

## 2013-12-24 NOTE — Telephone Encounter (Signed)
Last seen 8/31

## 2014-01-01 ENCOUNTER — Emergency Department (HOSPITAL_COMMUNITY)
Admission: EM | Admit: 2014-01-01 | Discharge: 2014-01-02 | Disposition: A | Discharge status: Home / Self Care | Payer: BC Managed Care – PPO | Attending: Emergency Medicine | Admitting: Emergency Medicine

## 2014-01-01 ENCOUNTER — Encounter (HOSPITAL_COMMUNITY): Payer: Self-pay | Admitting: Emergency Medicine

## 2014-01-01 DIAGNOSIS — Z86010 Personal history of colonic polyps: Secondary | ICD-10-CM | POA: Diagnosis not present

## 2014-01-01 DIAGNOSIS — E782 Mixed hyperlipidemia: Secondary | ICD-10-CM | POA: Diagnosis not present

## 2014-01-01 DIAGNOSIS — Z79899 Other long term (current) drug therapy: Secondary | ICD-10-CM | POA: Diagnosis not present

## 2014-01-01 DIAGNOSIS — Z8619 Personal history of other infectious and parasitic diseases: Secondary | ICD-10-CM | POA: Diagnosis not present

## 2014-01-01 DIAGNOSIS — I10 Essential (primary) hypertension: Secondary | ICD-10-CM | POA: Insufficient documentation

## 2014-01-01 DIAGNOSIS — R600 Localized edema: Secondary | ICD-10-CM | POA: Insufficient documentation

## 2014-01-01 DIAGNOSIS — E039 Hypothyroidism, unspecified: Secondary | ICD-10-CM | POA: Diagnosis not present

## 2014-01-01 DIAGNOSIS — R2242 Localized swelling, mass and lump, left lower limb: Secondary | ICD-10-CM | POA: Diagnosis present

## 2014-01-01 DIAGNOSIS — F419 Anxiety disorder, unspecified: Secondary | ICD-10-CM | POA: Diagnosis not present

## 2014-01-01 DIAGNOSIS — R609 Edema, unspecified: Secondary | ICD-10-CM

## 2014-01-01 NOTE — ED Notes (Signed)
Patient states swelling to legs that started this week. Patient states she takes "fluid pill" at home. +2 non pitting edema noted to left ankle, +1 non pitting edema noted to right ankle. +pulses noted to bilateral ankles. A&OX4 and ambulatory without deficit.

## 2014-01-02 ENCOUNTER — Emergency Department (HOSPITAL_COMMUNITY): Payer: BC Managed Care – PPO

## 2014-01-02 LAB — URINE MICROSCOPIC-ADD ON

## 2014-01-02 LAB — URINALYSIS, ROUTINE W REFLEX MICROSCOPIC
BILIRUBIN URINE: NEGATIVE
Glucose, UA: NEGATIVE mg/dL
Ketones, ur: NEGATIVE mg/dL
Leukocytes, UA: NEGATIVE
Nitrite: NEGATIVE
Protein, ur: NEGATIVE mg/dL
Specific Gravity, Urine: <1.005 — ABNORMAL LOW (ref 1.005–1.030)
UROBILINOGEN UA: 0.2 mg/dL (ref 0.0–1.0)
pH: 7 (ref 5.0–8.0)

## 2014-01-02 IMAGING — US US EXTREM LOW VENOUS*L*
1 series · 13 of 24 positions shown · non-contrast
Comparison: None.

CLINICAL DATA: Left lower extremity swelling.



[Series 1: us extrem low venous*left* · 0.08mm/px · 13 of 30 slices shown]
[im 1/30]
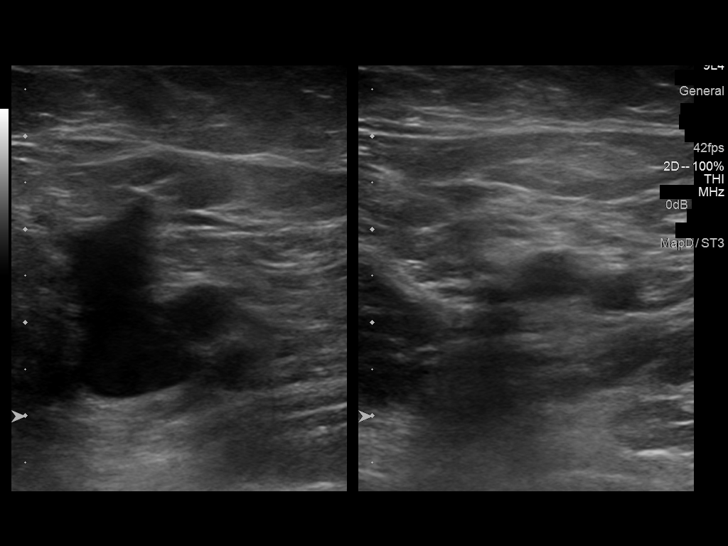
[im 3/30]
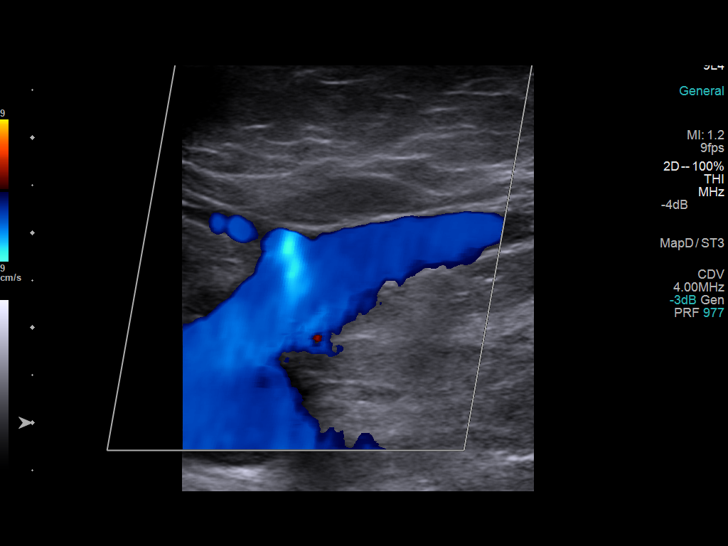
[im 6/30]
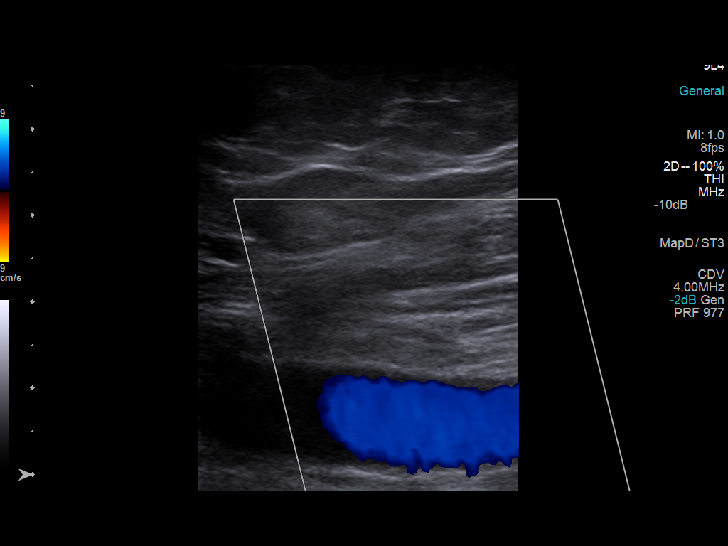
[im 8/30]
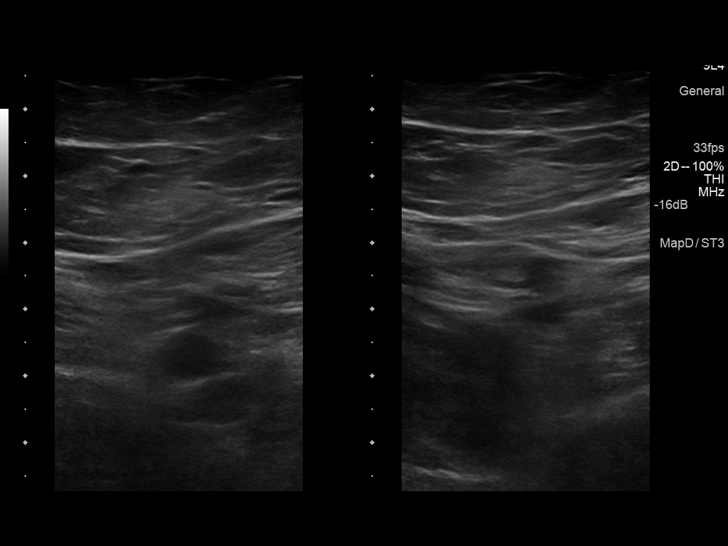
[im 11/30]
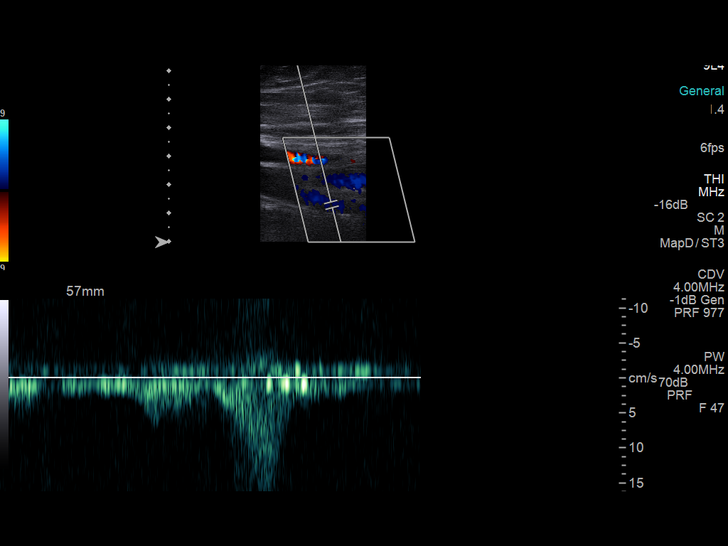
[im 13/30]
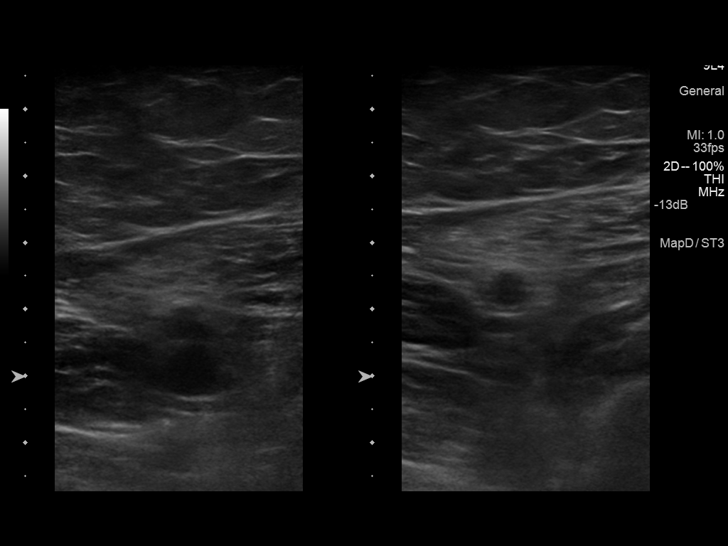
[im 16/30]
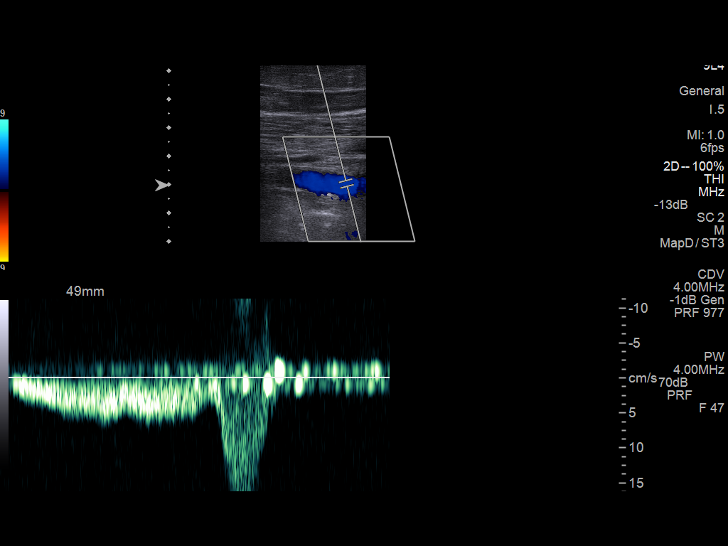
[im 17/30]
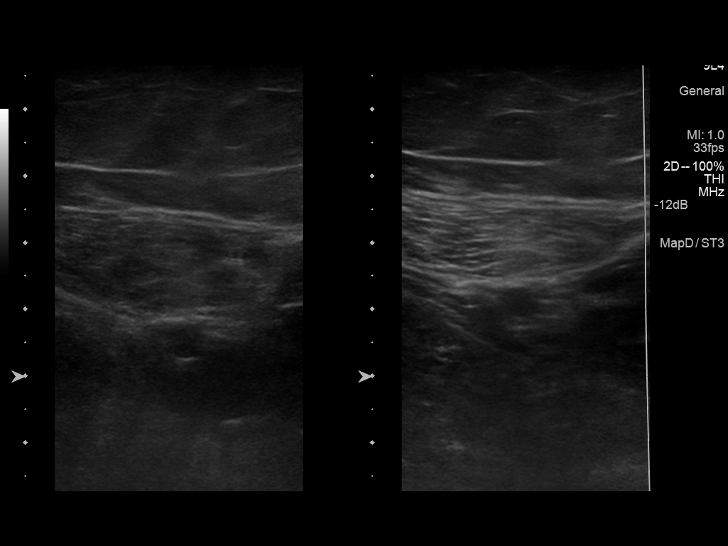
[im 19/30]
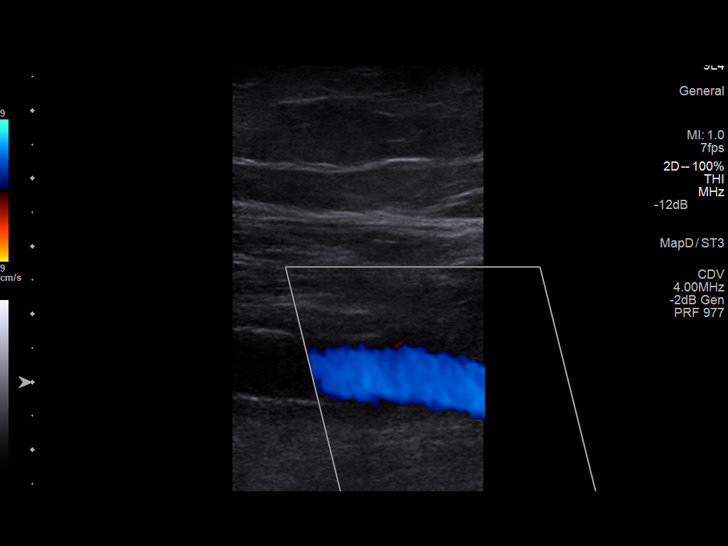
[im 22/30]
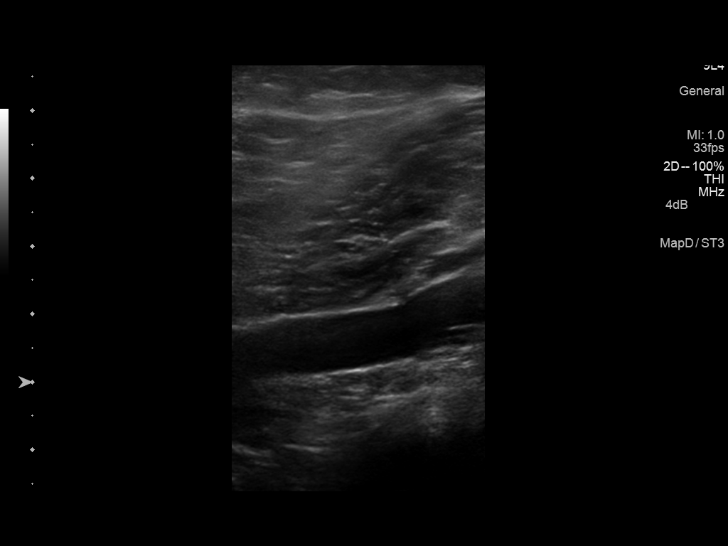
[im 24/30]
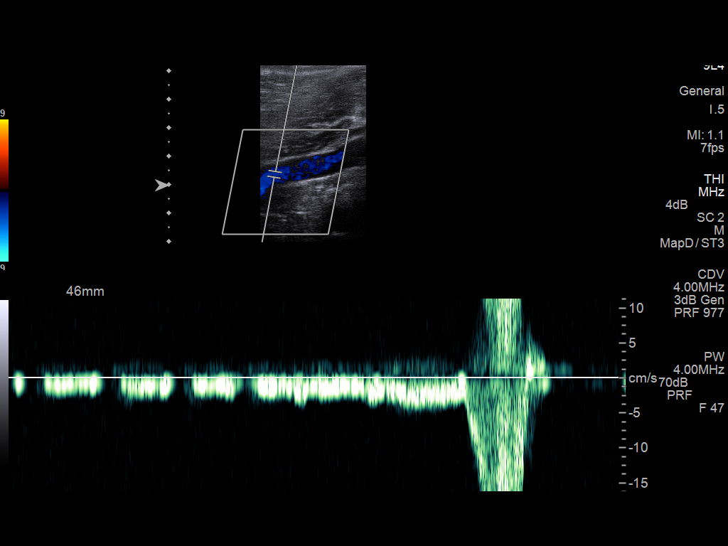
[im 27/30]
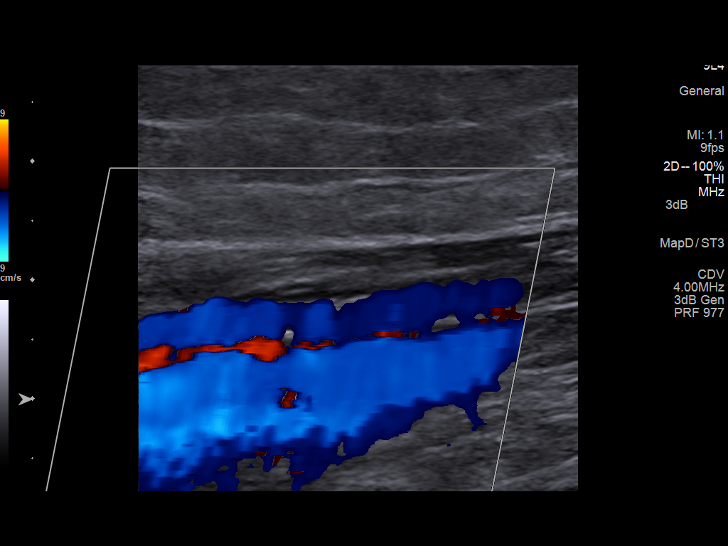
[im 30/30]
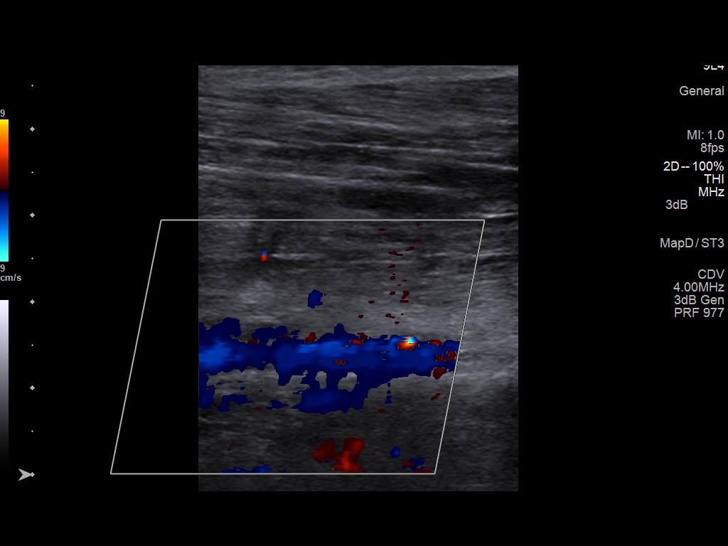

[13 of 24 positions shown; findings below may reference images not displayed]

FINDINGS: Common Femoral Vein: No evidence of thrombus. Normal
compressibility, respiratory phasicity and response to augmentation.

Saphenofemoral Junction: No evidence of thrombus. Normal
compressibility and flow on color Doppler imaging.

Profunda Femoral Vein: No evidence of thrombus. Normal
compressibility and flow on color Doppler imaging.

Femoral Vein: No evidence of thrombus. Normal compressibility,
respiratory phasicity and response to augmentation.

Popliteal Vein: No evidence of thrombus. Normal compressibility,
respiratory phasicity and response to augmentation.

Calf Veins: No evidence of thrombus. Normal compressibility and flow
on color Doppler imaging.

Superficial Great Saphenous Vein: No evidence of thrombus. Normal
compressibility and flow on color Doppler imaging.

Venous Reflux:  None.

Other Findings:  None.
IMPRESSION: No evidence of deep venous thrombosis.

## 2014-01-02 NOTE — Discharge Instructions (Signed)
Edema  Edema is an abnormal buildup of fluids. It is more common in your legs and thighs. Painless swelling of the feet and ankles is more likely as a person ages. It also is common in looser skin, like around your eyes.  HOME CARE   · Keep the affected body part above the level of the heart while lying down.  · Do not sit still or stand for a long time.  · Do not put anything right under your knees when you lie down.  · Do not wear tight clothes on your upper legs.  · Exercise your legs to help the puffiness (swelling) go down.  · Wear elastic bandages or support stockings as told by your doctor.  · A low-salt diet may help lessen the puffiness.  · Only take medicine as told by your doctor.  GET HELP IF:  · Treatment is not working.  · You have heart, liver, or kidney disease and notice that your skin looks puffy or shiny.  · You have puffiness in your legs that does not get better when you raise your legs.  · You have sudden weight gain for no reason.  GET HELP RIGHT AWAY IF:   · You have shortness of breath or chest pain.  · You cannot breathe when you lie down.  · You have pain, redness, or warmth in the areas that are puffy.  · You have heart, liver, or kidney disease and get edema all of a sudden.  · You have a fever and your symptoms get worse all of a sudden.  MAKE SURE YOU:   · Understand these instructions.  · Will watch your condition.  · Will get help right away if you are not doing well or get worse.  Document Released: 08/08/2007 Document Revised: 02/24/2013 Document Reviewed: 12/12/2012  Exit[employer]® Patient Information ©2015 [employer]. This information is not intended to replace advice given to you by your health care provider. Make sure you discuss any questions you have with your health care provider.

## 2014-01-02 NOTE — ED Provider Notes (Signed)
CSN: [id number]     Arrival date & time 01/01/14  2211 History  This chart was scribed for Kaitlin Fines, MD by Tula Nakayama, ED Scribe. This patient was seen in room [facility] and the patient's care was started at 12:15 AM.    Chief Complaint  Patient presents with  . Leg Swelling   The history is provided by the patient. No language interpreter was used.   HPI Comments: Kaitlin [name] is a 58 y.o. female who presents to the Emergency Department complaining of left leg swelling that started 5 days ago. Pt states swelling started above the knee and expanded to ankle earlier today. She notes pain in the knee when she lies down. Pain is mild to moderate. Pt denies recent injury to area, SOB, or CP as associated symptoms. There is no associated ecchymosis, erythema or warmth.  Past Medical History  Diagnosis Date  . BMI 30.0-30.9,adult 2008 182 LBS    2009 194 LBS  . Helicobacter pylori gastritis AUG 2008 PREVPAC: nausea-->HELIDAC: GI UPSET    HB 14.1 NL HFP H. PYLORI STOOL Ag NEG  . Colon polyp APR 2008  . Anxiety   . Essential hypertension, benign   . Hypothyroidism   . Mixed hyperlipidemia   . Irritable bowel syndrome   . Tubulovillous adenoma 08/07/10    Colonoscopy w/ Dr Oneida Alar w/ 7 polyps removed-bx showed tubular adenomas.  Largest 1cm-hepatic flexure polyp->TA.    Marland Kitchen Panic attacks   . Reflux   . Kidney stones   . Depression   . Folliculitis 8/67/5449  . Mixed stress and urge urinary incontinence 09/26/2012  . Polyp of colon, hyperplastic    Past Surgical History  Procedure Laterality Date  . Colonoscopy  APR 2008    AC/[place] SIMPLE ADENOMA  . Upper gastrointestinal endoscopy  AUG 2008 PAIN/NAUSEA    H. PYLORI treated  . Cholecystectomy    . Mass excision  02/28/2011    Procedure: EXCISION MASS;  Surgeon: Donato Heinz, MD;  Location: [facility];  Service: General;  Laterality: Right;  soft tissue mass  . Colonoscopy  08/07/2010    Dr. Raynald Kemp adenomas and  tubulovillous adenoma; needs surveillance 2015  . Colonoscopy N/A 09/18/2013    Dr.Rourk- normal rectum, 2 diminutive polys in the mid sigmoid segment o/w the remainder of the colonic mucosa appeared normal.hyperplastic poylps   Family History  Problem Relation Age of Onset  . Colon cancer Neg Hx   . Anesthesia problems Neg Hx   . Hypotension Neg Hx   . Malignant hyperthermia Neg Hx   . Pseudochol deficiency Neg Hx   . Coronary artery disease Mother   . Hypertension Mother   . Heart attack Mother   . Heart disease Mother   . Coronary artery disease Brother     Age 83  . Heart attack Brother   . Heart disease Brother   . Diabetes Sister   . Diabetes Sister    History  Substance Use Topics  . Smoking status: Never Smoker   . Smokeless tobacco: Never Used     Comment: Never Smoked  . Alcohol Use: No   OB History   Grav Para Term Preterm Abortions TAB SAB Ect Mult Living   1 1             Review of Systems  A complete 10 system review of systems was obtained and all systems are negative except as noted in the HPI and PMH.  Allergies  Asa; Augmentin; Levaquin; Rocephin; Valtrex; and Zoloft  Home Medications   Prior to Admission medications   Medication Sig Start Date End Date Taking? Authorizing Provider  ALPRAZolam Duanne Moron) 1 MG tablet Take 1 mg by mouth 3 (three) times daily.   Yes Historical Provider, MD  enalapril (VASOTEC) 20 MG tablet Take 20 mg by mouth at bedtime.   Yes Historical Provider, MD  furosemide (LASIX) 20 MG tablet Take 10 mg by mouth daily.   Yes Historical Provider, MD  levothyroxine (SYNTHROID, LEVOTHROID) 50 MCG tablet Take 50 mcg by mouth daily before breakfast.   Yes Historical Provider, MD  Linaclotide (LINZESS) 145 MCG CAPS capsule Take 1 capsule (145 mcg total) by mouth daily. 30 minutes before breakfast on an empty stomach 11/12/13  Yes Orvil Feil, NP  lovastatin (MEVACOR) 40 MG tablet Take 40 mg by mouth at bedtime.   Yes Historical  Provider, MD  verapamil (CALAN-SR) 240 MG CR tablet Take 240 mg by mouth daily.   Yes Historical Provider, MD  Vitamin D, Ergocalciferol, (DRISDOL) 50000 UNITS CAPS capsule Take 1 capsule (50,000 Units total) by mouth 2 (two) times a week. 12/03/13  Yes Renato Shin, MD  nystatin (MYCOSTATIN) 100000 UNIT/ML suspension Take 5 mLs by mouth 3 (three) times daily as needed. For thrush 12/31/13   Historical Provider, MD   BP 138/81  Pulse 67  Temp(Src) 97.6 F (36.4 C) (Oral)  Resp 13  Ht 5\' 5"  (1.651 m)  Wt 208 lb (94.348 kg)  BMI 34.61 kg/m2  SpO2 100% Physical Exam  Nursing note and vitals reviewed.  General: Well-developed, well-nourished female in no acute distress; appearance consistent with age of record HENT: normocephalic; atraumatic Eyes: pupils equal, round and reactive to light; extraocular muscles intact Neck: supple Heart: regular rate and rhythm; no murmurs, rubs or gallops Lungs: clear to auscultation bilaterally Abdomen: soft; nondistended; nontender; no masses or hepatosplenomegaly; bowel sounds present Extremities: No deformity; full range of motion; pulses normal; 1+ pitting edema of left lower extremity with mild tenderness. No erythema or ecchymosis.  Neurologic: Awake, alert and oriented; motor function intact in all extremities and symmetric; no facial droop Skin: Warm and dry Psychiatric: Normal mood and affect  ED Course  Procedures (including critical care time)  DIAGNOSTIC STUDIES: Oxygen Saturation is 100% on RA, normal by my interpretation.    COORDINATION OF CARE: 12:22 AM Discussed treatment plan with pt at bedside and pt agreed to plan.   MDM   Nursing notes and vitals signs, including pulse oximetry, reviewed.  Summary of this visit's results, reviewed by myself:  Labs:  Results for orders placed during the hospital encounter of 01/01/14 (from the past 24 hour(s))  URINALYSIS, ROUTINE W REFLEX MICROSCOPIC     Status: Abnormal   Collection  Time    01/01/14 11:54 PM      Result Value Ref Range   Color, Urine YELLOW  YELLOW   APPearance CLEAR  CLEAR   Specific Gravity, Urine <1.005 (*) 1.005 - 1.030   pH 7.0  5.0 - 8.0   Glucose, UA NEGATIVE  NEGATIVE mg/dL   Hgb urine dipstick TRACE (*) NEGATIVE   Bilirubin Urine NEGATIVE  NEGATIVE   Ketones, ur NEGATIVE  NEGATIVE mg/dL   Protein, ur NEGATIVE  NEGATIVE mg/dL   Urobilinogen, UA 0.2  0.0 - 1.0 mg/dL   Nitrite NEGATIVE  NEGATIVE   Leukocytes, UA NEGATIVE  NEGATIVE  URINE MICROSCOPIC-ADD ON     Status: None  Collection Time    01/01/14 11:54 PM      Result Value Ref Range   Squamous Epithelial / LPF RARE  RARE   WBC, UA 0-2  <3 WBC/hpf   RBC / HPF 0-2  <3 RBC/hpf   Bacteria, UA RARE  RARE    Imaging Studies: US Venous Img Lower Unilateral Left  01/02/2014   CLINICAL DATA:  Left lower extremity swelling.  EXAM: Left LOWER EXTREMITY VENOUS DOPPLER ULTRASOUND  TECHNIQUE: Gray-scale sonography with graded compression, as well as color Doppler and duplex ultrasound were performed to evaluate the lower extremity deep venous systems from the level of the common femoral vein and including the common femoral, femoral, profunda femoral, popliteal and calf veins including the posterior tibial, peroneal and gastrocnemius veins when visible. The superficial great saphenous vein was also interrogated. Spectral Doppler was utilized to evaluate flow at rest and with distal augmentation maneuvers in the common femoral, femoral and popliteal veins.  COMPARISON:  None.  FINDINGS: Common Femoral Vein: No evidence of thrombus. Normal compressibility, respiratory phasicity and response to augmentation.  Saphenofemoral Junction: No evidence of thrombus. Normal compressibility and flow on color Doppler imaging.  Profunda Femoral Vein: No evidence of thrombus. Normal compressibility and flow on color Doppler imaging.  Femoral Vein: No evidence of thrombus. Normal compressibility, respiratory phasicity  and response to augmentation.  Popliteal Vein: No evidence of thrombus. Normal compressibility, respiratory phasicity and response to augmentation.  Calf Veins: No evidence of thrombus. Normal compressibility and flow on color Doppler imaging.  Superficial Great Saphenous Vein: No evidence of thrombus. Normal compressibility and flow on color Doppler imaging.  Venous Reflux:  None.  Other Findings:  None.  IMPRESSION: No evidence of deep venous thrombosis.   Electronically Signed   By: Kerby Moors M.D.   On: 01/02/2014 02:02    I personally performed the services described in this documentation, which was scribed in my presence. The recorded information has been reviewed and is accurate.    Kaitlin Fines, MD 01/02/14 [phone]

## 2014-01-04 ENCOUNTER — Encounter: Payer: Self-pay | Admitting: Family Medicine

## 2014-01-04 ENCOUNTER — Ambulatory Visit (INDEPENDENT_AMBULATORY_CARE_PROVIDER_SITE_OTHER): Payer: BC Managed Care – PPO | Admitting: Family Medicine

## 2014-01-04 VITALS — BP 112/72 | Temp 99.2°F | Ht 65.0 in | Wt 203.0 lb

## 2014-01-04 DIAGNOSIS — J329 Chronic sinusitis, unspecified: Secondary | ICD-10-CM

## 2014-01-04 DIAGNOSIS — I10 Essential (primary) hypertension: Secondary | ICD-10-CM

## 2014-01-04 DIAGNOSIS — I878 Other specified disorders of veins: Secondary | ICD-10-CM

## 2014-01-04 MED ORDER — AZITHROMYCIN 250 MG PO TABS: ORAL_TABLET | ORAL | Status: DC

## 2014-01-04 NOTE — Progress Notes (Signed)
   Subjective:    Patient ID: [name], female    DOB: 1955/12/27, 58 y.o.   MRN: [id number]  HPI Patient is here today for ER f/u on 10/30 for edema in her left leg. They told her it was retained fluid.  [name] came back negative.   She is also congested, coughing, runny nose, HA, fever, and body aches. S/S started Saturday night. Friday developed cough and cong  Bad frontal headache and coughing up phlegm  Yell disch fro head  Patient has history of known venous stasis. She is also on multiple medications for blood pressure.  1 E. Medications his verapamil.  Patient was started on Lasix by her endocrinologist. Taking just a half a tablet daily. Review of Systems No headache no chest pain or shortness breath no abdominal pain no change in bowel habits    Objective:   Physical Exam  Alert no apparent distress moderate malaise H and T frontal maxillary tenderness for his normal neck supple. Lungs clear heart regular rhythm left leg 1+ edema right leg 1/2+ edema pulses good sensation intact.      Assessment & Plan:  Impression 1 rhinosinusitis #2 venous stasis discussed at length. #3 hypertension good control plan antibiotics prescribed. Symptomatic care discussed. Increase Lasix to 20 mg daily. Patient had many questions regarding this I think her problem primary problem is venous stasis with perhaps some accentuation via the verapamil. Patient was worried about her heart and kidneys I think these are fine. Discussed. WS L

## 2014-01-05 ENCOUNTER — Other Ambulatory Visit: Payer: Self-pay | Admitting: Family Medicine

## 2014-02-17 ENCOUNTER — Encounter: Payer: Self-pay | Admitting: Gastroenterology

## 2014-02-17 ENCOUNTER — Other Ambulatory Visit: Payer: Self-pay | Admitting: Family Medicine

## 2014-02-17 ENCOUNTER — Ambulatory Visit (INDEPENDENT_AMBULATORY_CARE_PROVIDER_SITE_OTHER): Payer: BC Managed Care – PPO | Admitting: Gastroenterology

## 2014-02-17 VITALS — BP 160/100 | HR 88 | Temp 97.5°F | Ht 63.0 in | Wt 200.2 lb

## 2014-02-17 DIAGNOSIS — K5900 Constipation, unspecified: Secondary | ICD-10-CM | POA: Insufficient documentation

## 2014-02-17 NOTE — Progress Notes (Signed)
Referring Provider: [name], MD Primary Care Physician:  Rubbie Battiest, MD  Primary GI: Dr. Gala Romney   Chief Complaint  Patient presents with  . Follow-up    HPI:   Kaitlin [name] presents today in routine follow-up with a history of constipation and colonic adenomas. Due for surveillance in 2020, with most recent colonoscopy July 2015. Very stressed today due to loss of brother-in-law last week and upcoming job interview in a few hours. Just took a xanax. States she needs to hurry and make the visit "quick".   Constipation better. Taking prunes, some fiber. Not taking Linzess. Prunes every day. BM every 2-3 days. Willing to try Linzess. Has at home. No other GI complaints.   Past Medical History  Diagnosis Date  . BMI 30.0-30.9,adult 2008 182 LBS    2009 194 LBS  . Helicobacter pylori gastritis AUG 2008 PREVPAC: nausea-->HELIDAC: GI UPSET    HB 14.1 NL HFP H. PYLORI STOOL Ag NEG  . Colon polyp APR 2008  . Anxiety   . Essential hypertension, benign   . Hypothyroidism   . Mixed hyperlipidemia   . Irritable bowel syndrome   . Tubulovillous adenoma 08/07/10    Colonoscopy w/ Dr Oneida Alar w/ 7 polyps removed-bx showed tubular adenomas.  Largest 1cm-hepatic flexure polyp->TA.    Marland Kitchen Panic attacks   . Reflux   . Kidney stones   . Depression   . Folliculitis 1/61/0960  . Mixed stress and urge urinary incontinence 09/26/2012  . Polyp of colon, hyperplastic     Past Surgical History  Procedure Laterality Date  . Colonoscopy  APR 2008    AC/[place] SIMPLE ADENOMA  . Upper gastrointestinal endoscopy  AUG 2008 PAIN/NAUSEA    H. PYLORI treated  . Cholecystectomy    . Mass excision  02/28/2011    Procedure: EXCISION MASS;  Surgeon: Donato Heinz, MD;  Location: [facility];  Service: General;  Laterality: Right;  soft tissue mass  . Colonoscopy  08/07/2010    Dr. Raynald Kemp adenomas and tubulovillous adenoma; needs surveillance 2015  . Colonoscopy N/A 09/18/2013    Dr.Rourk- normal  rectum, 2 diminutive polys in the mid sigmoid segment o/w the remainder of the colonic mucosa appeared normal.hyperplastic poylps    Current Outpatient Prescriptions  Medication Sig Dispense Refill  . ALPRAZolam (XANAX) 1 MG tablet Take 1 mg by mouth 3 (three) times daily.    . enalapril (VASOTEC) 20 MG tablet Take 20 mg by mouth at bedtime.    . furosemide (LASIX) 20 MG tablet Take 10 mg by mouth daily.    Marland Kitchen levothyroxine (SYNTHROID, LEVOTHROID) 50 MCG tablet Take 50 mcg by mouth daily before breakfast.    . lovastatin (MEVACOR) 40 MG tablet Take 40 mg by mouth at bedtime.    Marland Kitchen nystatin (MYCOSTATIN) 100000 UNIT/ML suspension Take 5 mLs by mouth 3 (three) times daily as needed. For thrush    . verapamil (CALAN-SR) 240 MG CR tablet TAKE 1 TABLET BY MOUTH EVERY DAY 30 tablet 5  . Vitamin D, Ergocalciferol, (DRISDOL) 50000 UNITS CAPS capsule Take 1 capsule (50,000 Units total) by mouth 2 (two) times a week. 10 capsule 1   No current facility-administered medications for this visit.    Allergies as of 02/17/2014 - Review Complete 02/17/2014  Allergen Reaction Noted  . Asa [aspirin] Other (See Comments) 06/09/2012  . Augmentin [amoxicillin-pot clavulanate] Diarrhea 06/09/2012  . Levaquin [levofloxacin in d5w] Other (See Comments) 07/14/2012  . Rocephin [ceftriaxone sodium in  dextrose] Hives and Other (See Comments) 06/09/2012  . Valtrex [valacyclovir hcl] Hives and Other (See Comments) 06/09/2012  . Zoloft [sertraline hcl] Other (See Comments) 06/09/2012    Family History  Problem Relation Age of Onset  . Colon cancer Neg Hx   . Anesthesia problems Neg Hx   . Hypotension Neg Hx   . Malignant hyperthermia Neg Hx   . Pseudochol deficiency Neg Hx   . Coronary artery disease Mother   . Hypertension Mother   . Heart attack Mother   . Heart disease Mother   . Coronary artery disease Brother     Age 70  . Heart attack Brother   . Heart disease Brother   . Diabetes Sister   . Diabetes  Sister     History   Social History  . Marital Status: Married    Spouse Name: N/A    Number of Children: N/A  . Years of Education: N/A   Social History Main Topics  . Smoking status: Never Smoker   . Smokeless tobacco: Never Used     Comment: Never Smoked  . Alcohol Use: No  . Drug Use: No  . Sexual Activity: No   Other Topics Concern  . None   Social History Narrative    Review of Systems: Negative unless mentioned in HPI.   Physical Exam: BP 160/100 mmHg  Pulse 88  Temp(Src) 97.5 F (36.4 C) (Oral)  Ht 5\' 3"  (1.6 m)  Wt 200 lb 3.2 oz (90.81 kg)  BMI 35.47 kg/m2 General:   Alert and oriented. Anxious.  Head:  Normocephalic and atraumatic. Abdomen:  +BS, soft, non-tender and non-distended. No rebound or guarding. No HSM or masses noted. Msk:  Symmetrical without gross deformities. Normal posture. Extremities:  Without edema. Neurologic:  Alert and  oriented x4;  grossly normal neurologically. Skin:  Intact without significant lesions or rashes. Psych:  Alert and cooperative. Normal mood and affect.

## 2014-02-17 NOTE — Progress Notes (Signed)
cc'ed to pcp °

## 2014-02-17 NOTE — Assessment & Plan Note (Signed)
58 year old female with chronic constipation, utilizing prunes and fiber without significant improvement. Despite having Linzess at home, she has not started taking this. Discussed taking once daily, continued fiber supplementation, and returning in 6 months. No other concerning symptoms today. Next colonoscopy in 2020 due to hx of adenomatous polyps.

## 2014-02-17 NOTE — Patient Instructions (Signed)
Continue a high fiber diet. Try Linzess 1 capsule each morning, 30 minutes prior to breakfast. This could cause loose stool for a few days but should even out. If not, call us.   We will see you back in 6 months. Next colonoscopy in 2020.   Good luck today and Merry Christmas!

## 2014-02-24 ENCOUNTER — Other Ambulatory Visit: Payer: Self-pay | Admitting: Family Medicine

## 2014-03-01 ENCOUNTER — Other Ambulatory Visit: Payer: Self-pay | Admitting: Family Medicine

## 2014-03-06 ENCOUNTER — Other Ambulatory Visit: Payer: Self-pay | Admitting: Family Medicine

## 2014-03-09 ENCOUNTER — Other Ambulatory Visit: Payer: Self-pay | Admitting: Family Medicine

## 2014-03-10 ENCOUNTER — Other Ambulatory Visit: Payer: Self-pay | Admitting: Family Medicine

## 2014-05-11 ENCOUNTER — Ambulatory Visit (HOSPITAL_COMMUNITY)
Admission: RE | Admit: 2014-05-11 | Discharge: 2014-05-11 | Disposition: A | Discharge status: Home / Self Care | Payer: 59 | Source: Ambulatory Visit | Attending: Family Medicine | Admitting: Family Medicine

## 2014-05-11 ENCOUNTER — Other Ambulatory Visit: Payer: Self-pay | Admitting: Obstetrics & Gynecology

## 2014-05-11 ENCOUNTER — Encounter: Payer: Self-pay | Admitting: Family Medicine

## 2014-05-11 ENCOUNTER — Ambulatory Visit (INDEPENDENT_AMBULATORY_CARE_PROVIDER_SITE_OTHER): Payer: 59 | Admitting: Family Medicine

## 2014-05-11 VITALS — BP 144/90 | Temp 98.0°F | Ht 65.0 in | Wt 209.0 lb

## 2014-05-11 DIAGNOSIS — Z13220 Encounter for screening for lipoid disorders: Secondary | ICD-10-CM | POA: Diagnosis not present

## 2014-05-11 DIAGNOSIS — Z1231 Encounter for screening mammogram for malignant neoplasm of breast: Secondary | ICD-10-CM

## 2014-05-11 DIAGNOSIS — Z79899 Other long term (current) drug therapy: Secondary | ICD-10-CM | POA: Diagnosis not present

## 2014-05-11 DIAGNOSIS — E039 Hypothyroidism, unspecified: Secondary | ICD-10-CM | POA: Diagnosis not present

## 2014-05-11 DIAGNOSIS — M25562 Pain in left knee: Secondary | ICD-10-CM

## 2014-05-11 DIAGNOSIS — M25462 Effusion, left knee: Secondary | ICD-10-CM | POA: Insufficient documentation

## 2014-05-11 IMAGING — DX DG KNEE COMPLETE 4+V*L*
4 series · 4 of 4 positions shown · non-contrast
Comparison: None.

CLINICAL DATA: Left knee pain for 2 weeks, no known injury

EXAM:
LEFT KNEE - COMPLETE 4+ VIEW

[knee ap]
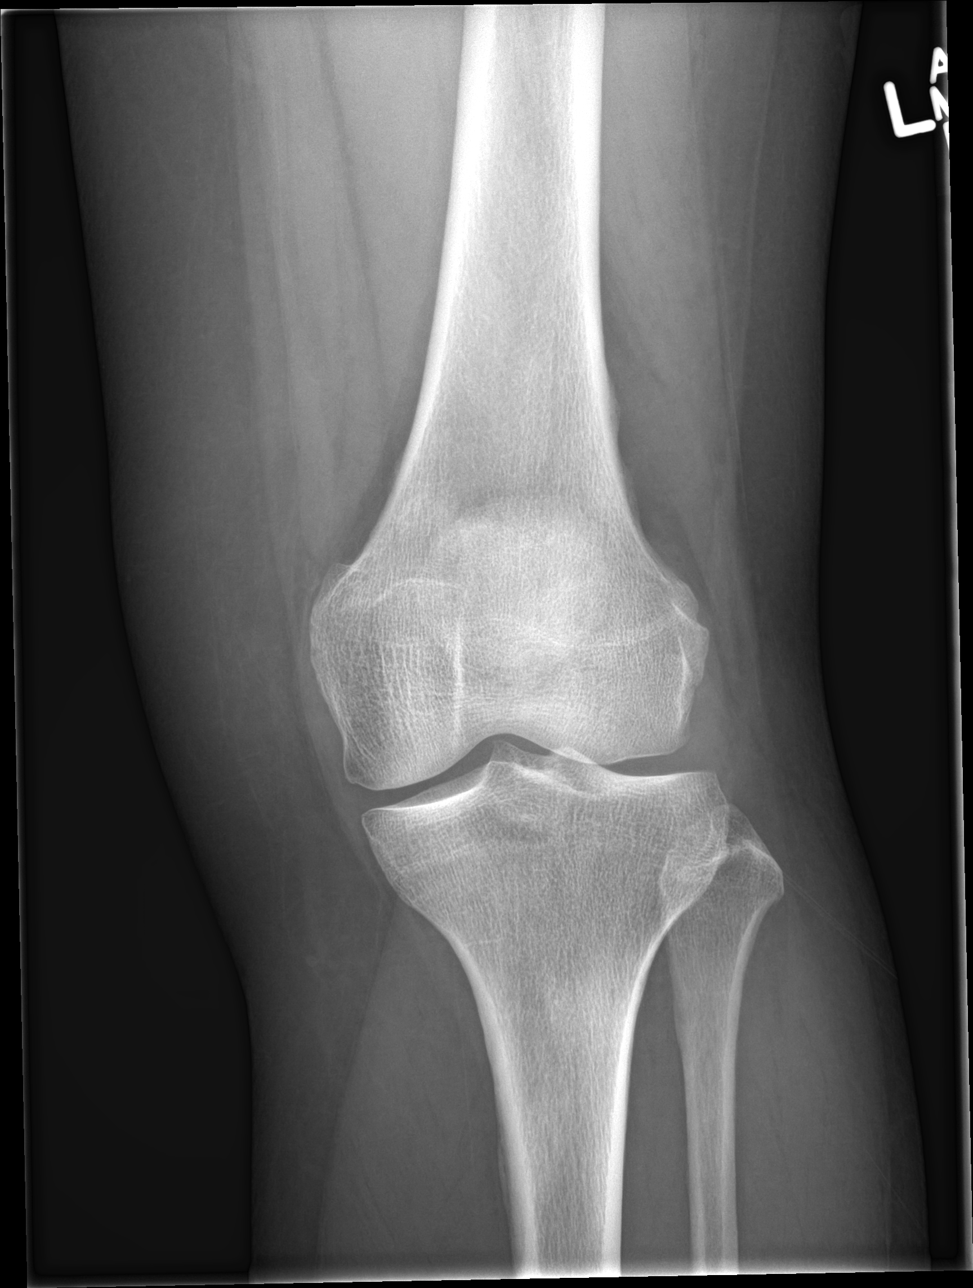

[knee obl (1 of 2)]
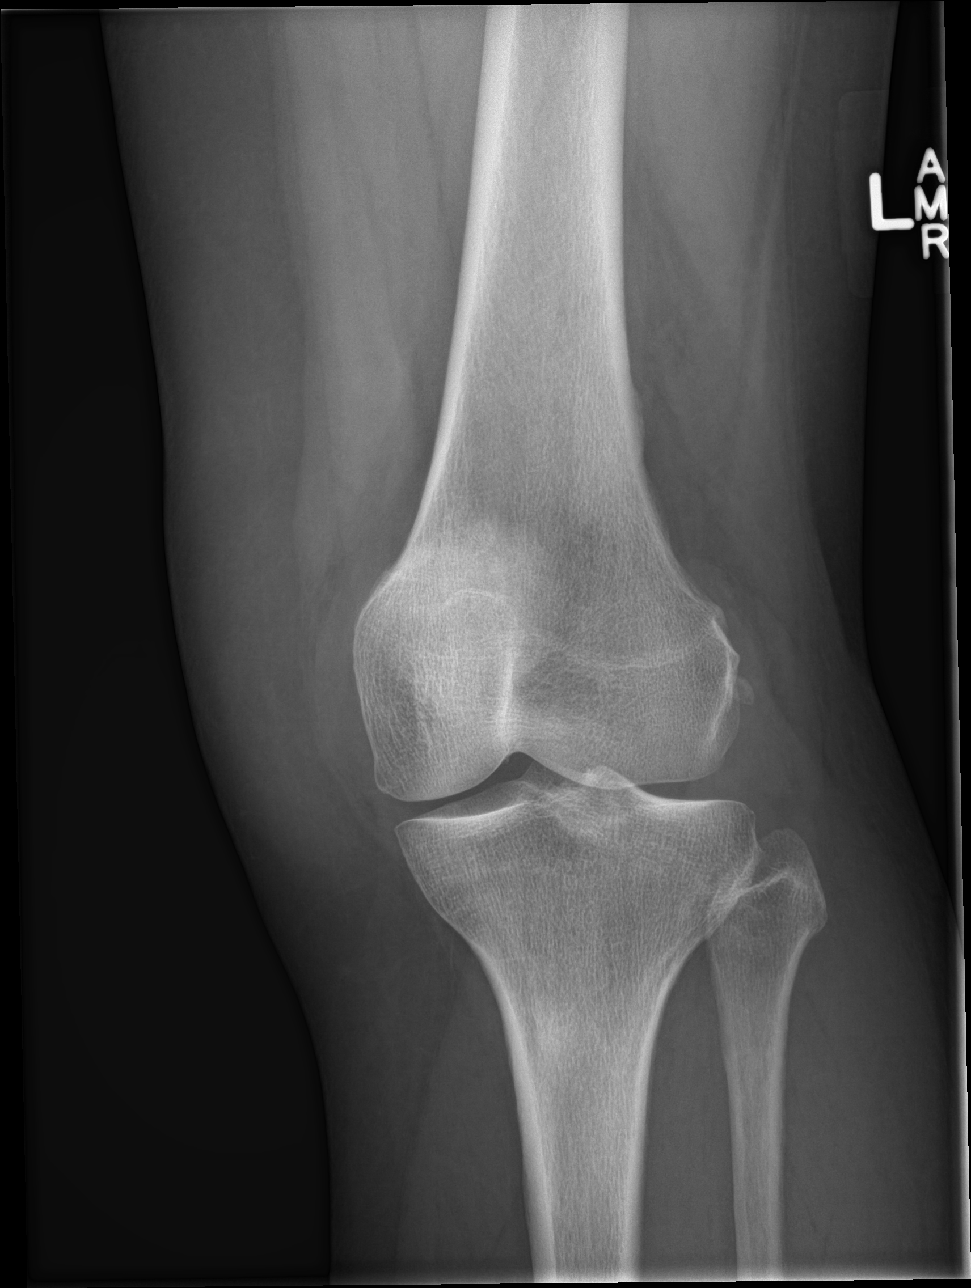

[knee obl (2 of 2)]
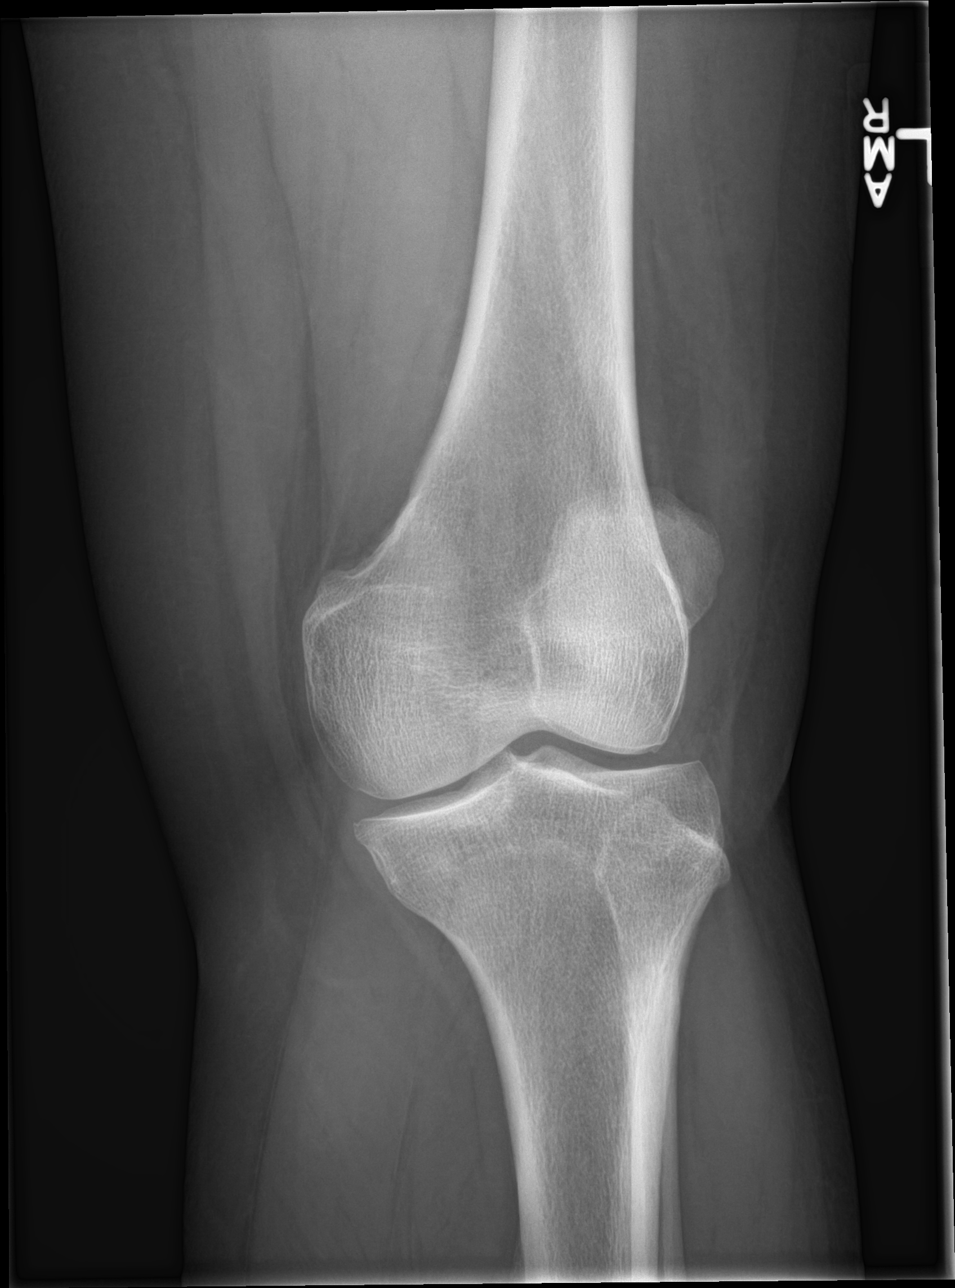

[knee lat]
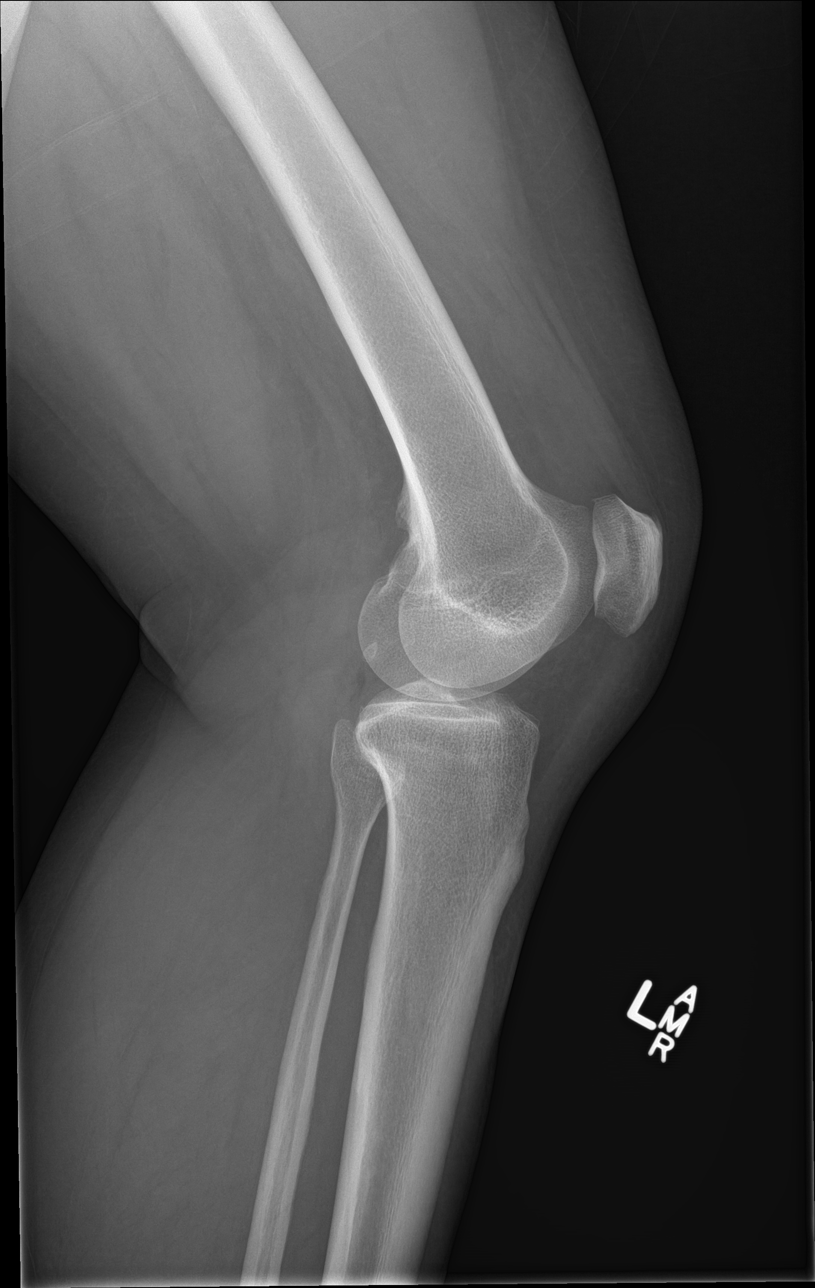

[4 of 4 positions shown; findings below may reference images not displayed]

FINDINGS: Four views of the left knee submitted. No acute fracture or
subluxation. Minimal narrowing of medial joint compartment. Mild
narrowing of patellofemoral joint space. Small joint effusion.
IMPRESSION: No acute fracture or subluxation. Mild degenerative changes. Small
joint effusion.

## 2014-05-11 MED ORDER — AMOXICILLIN 500 MG PO CAPS: 500.0000 mg | ORAL_CAPSULE | Freq: Three times a day (TID) | ORAL | Status: DC

## 2014-05-11 MED ORDER — ETODOLAC 400 MG PO TABS: 400.0000 mg | ORAL_TABLET | Freq: Two times a day (BID) | ORAL | Status: DC

## 2014-05-11 MED ORDER — LOVASTATIN 40 MG PO TABS: 40.0000 mg | ORAL_TABLET | Freq: Every day | ORAL | Status: DC

## 2014-05-11 MED ORDER — LEVOTHYROXINE SODIUM 50 MCG PO TABS: 50.0000 ug | ORAL_TABLET | Freq: Every day | ORAL | Status: DC

## 2014-05-11 MED ORDER — ENALAPRIL MALEATE 20 MG PO TABS: 20.0000 mg | ORAL_TABLET | Freq: Every day | ORAL | Status: DC

## 2014-05-11 MED ORDER — VERAPAMIL HCL ER 240 MG PO TBCR: 240.0000 mg | EXTENDED_RELEASE_TABLET | Freq: Every day | ORAL | Status: DC

## 2014-05-11 NOTE — Progress Notes (Signed)
   Subjective:    Patient ID: Kaitlin [name], female    DOB: [birth year], 59 y.o.   MRN: [id number]  Sinusitis This is a new problem. Episode onset: 3 weeks. Associated symptoms include coughing, headaches and sneezing. Past treatments include nothing.   left knee swollen and stiff. Started about 2 weeks ago. Taking ibuprofen. Does not recall an injury. Has hurt at times with exertion, stiff at times., worse in the morn   Claims compliance with lipid medications. No obvious side effects.  Complains of intermittent knee pain see above.  States compliant with blood pressure medicine. Watching salt intake. Blood pressure generally good 1 elsewhere.  Admits to a fair amount of stress since she lost job.   Walking a little, gaining weight sicnce losing job      Review of Systems  HENT: Positive for sneezing.   Respiratory: Positive for cough.   Neurological: Positive for headaches.       Objective:   Physical Exam   Alert vital stable HET moderate his congestion frontal tenderness. Blood pressure improved on repeat lungs clear heart rare rhythm left knee substantial crepitations and effusion palpable     Assessment & Plan:  Impression flare of knee pain likely arthritis discussed #2 hypertension good control #3 hypothyroidism status uncertain #4 parathyroid hormone abnormalities with chronic elevated calcium discussed plan appropriate blood work. Diet exercise discussed. X-ray left knee trial of Lodine antibiotics prescribed WSL

## 2014-05-15 LAB — LIPID PANEL
CHOLESTEROL TOTAL: 201 mg/dL — AB (ref 100–199)
Chol/HDL Ratio: 4.1 ratio units (ref 0.0–4.4)
HDL: 49 mg/dL (ref >39)
LDL CALC: 135 mg/dL — AB (ref 0–99)
Triglycerides: 87 mg/dL (ref 0–149)
VLDL Cholesterol Cal: 17 mg/dL (ref 5–40)

## 2014-05-15 LAB — BASIC METABOLIC PANEL
BUN/Creatinine Ratio: 23 (ref 9–23)
BUN: 15 mg/dL (ref 6–24)
CHLORIDE: 103 mmol/L (ref 97–108)
CO2: 19 mmol/L (ref 18–29)
Calcium: 10.2 mg/dL (ref 8.7–10.2)
Creatinine, Ser: 0.66 mg/dL (ref 0.57–1.00)
GFR calc Af Amer: 113 mL/min/{1.73_m2} (ref >59)
GFR, EST NON AFRICAN AMERICAN: 98 mL/min/{1.73_m2} (ref >59)
Glucose: 106 mg/dL — ABNORMAL HIGH (ref 65–99)
POTASSIUM: 4.5 mmol/L (ref 3.5–5.2)
Sodium: 140 mmol/L (ref 134–144)

## 2014-05-15 LAB — HEPATIC FUNCTION PANEL
ALT: 28 IU/L (ref 0–32)
AST: 27 IU/L (ref 0–40)
Albumin: 4.4 g/dL (ref 3.5–5.5)
Alkaline Phosphatase: 76 IU/L (ref 39–117)
BILIRUBIN TOTAL: 0.3 mg/dL (ref 0.0–1.2)
BILIRUBIN, DIRECT: 0.12 mg/dL (ref 0.00–0.40)
Total Protein: 7.1 g/dL (ref 6.0–8.5)

## 2014-05-15 LAB — PTH, INTACT AND CALCIUM: PTH: 45 pg/mL (ref 15–65)

## 2014-05-15 LAB — TSH: TSH: 3.33 u[IU]/mL (ref 0.450–4.500)

## 2014-05-16 ENCOUNTER — Encounter: Payer: Self-pay | Admitting: Family Medicine

## 2014-05-18 ENCOUNTER — Telehealth: Payer: Self-pay | Admitting: *Deleted

## 2014-05-18 NOTE — Telephone Encounter (Signed)
Just theoretical take it

## 2014-05-18 NOTE — Telephone Encounter (Signed)
Discussed with chase at [employer]

## 2014-05-18 NOTE — Telephone Encounter (Signed)
Chase from [employer] calling to check on fax that was sent last week about interaction between verapamil and lovastatin.

## 2014-05-28 ENCOUNTER — Ambulatory Visit (HOSPITAL_COMMUNITY)
Admission: RE | Admit: 2014-05-28 | Discharge: 2014-05-28 | Disposition: A | Discharge status: Home / Self Care | Payer: 59 | Source: Ambulatory Visit | Attending: Obstetrics & Gynecology | Admitting: Obstetrics & Gynecology

## 2014-05-28 DIAGNOSIS — Z1231 Encounter for screening mammogram for malignant neoplasm of breast: Secondary | ICD-10-CM | POA: Diagnosis present

## 2014-05-31 ENCOUNTER — Emergency Department (HOSPITAL_COMMUNITY)
Admission: EM | Admit: 2014-05-31 | Discharge: 2014-05-31 | Disposition: A | Discharge status: Home / Self Care | Payer: 59 | Attending: Emergency Medicine | Admitting: Emergency Medicine

## 2014-05-31 ENCOUNTER — Encounter (HOSPITAL_COMMUNITY): Payer: Self-pay | Admitting: *Deleted

## 2014-05-31 DIAGNOSIS — I10 Essential (primary) hypertension: Secondary | ICD-10-CM | POA: Diagnosis not present

## 2014-05-31 DIAGNOSIS — Z8719 Personal history of other diseases of the digestive system: Secondary | ICD-10-CM | POA: Insufficient documentation

## 2014-05-31 DIAGNOSIS — Z8619 Personal history of other infectious and parasitic diseases: Secondary | ICD-10-CM | POA: Diagnosis not present

## 2014-05-31 DIAGNOSIS — E039 Hypothyroidism, unspecified: Secondary | ICD-10-CM | POA: Insufficient documentation

## 2014-05-31 DIAGNOSIS — Z86010 Personal history of colonic polyps: Secondary | ICD-10-CM | POA: Insufficient documentation

## 2014-05-31 DIAGNOSIS — R42 Dizziness and giddiness: Secondary | ICD-10-CM | POA: Diagnosis present

## 2014-05-31 DIAGNOSIS — Z79899 Other long term (current) drug therapy: Secondary | ICD-10-CM | POA: Insufficient documentation

## 2014-05-31 DIAGNOSIS — E782 Mixed hyperlipidemia: Secondary | ICD-10-CM | POA: Diagnosis not present

## 2014-05-31 DIAGNOSIS — Z87442 Personal history of urinary calculi: Secondary | ICD-10-CM | POA: Insufficient documentation

## 2014-05-31 DIAGNOSIS — Z791 Long term (current) use of non-steroidal anti-inflammatories (NSAID): Secondary | ICD-10-CM | POA: Diagnosis not present

## 2014-05-31 LAB — CBC WITH DIFFERENTIAL/PLATELET
BASOS ABS: 0 10*3/uL (ref 0.0–0.1)
Basophils Relative: 0 % (ref 0–1)
Eosinophils Absolute: 0.2 10*3/uL (ref 0.0–0.7)
Eosinophils Relative: 2 % (ref 0–5)
HCT: 42.7 % (ref 36.0–46.0)
Hemoglobin: 14.1 g/dL (ref 12.0–15.0)
LYMPHS PCT: 41 % (ref 12–46)
Lymphs Abs: 3.2 10*3/uL (ref 0.7–4.0)
MCH: 28.7 pg (ref 26.0–34.0)
MCHC: 33 g/dL (ref 30.0–36.0)
MCV: 86.8 fL (ref 78.0–100.0)
Monocytes Absolute: 0.5 10*3/uL (ref 0.1–1.0)
Monocytes Relative: 6 % (ref 3–12)
NEUTROS ABS: 4 10*3/uL (ref 1.7–7.7)
Neutrophils Relative %: 51 % (ref 43–77)
PLATELETS: 267 10*3/uL (ref 150–400)
RBC: 4.92 MIL/uL (ref 3.87–5.11)
RDW: 12.8 % (ref 11.5–15.5)
WBC: 7.8 10*3/uL (ref 4.0–10.5)

## 2014-05-31 LAB — URINALYSIS, ROUTINE W REFLEX MICROSCOPIC
BILIRUBIN URINE: NEGATIVE
Glucose, UA: NEGATIVE mg/dL
HGB URINE DIPSTICK: NEGATIVE
Ketones, ur: NEGATIVE mg/dL
Leukocytes, UA: NEGATIVE
Nitrite: NEGATIVE
PROTEIN: NEGATIVE mg/dL
Specific Gravity, Urine: 1.015 (ref 1.005–1.030)
UROBILINOGEN UA: 0.2 mg/dL (ref 0.0–1.0)
pH: 6 (ref 5.0–8.0)

## 2014-05-31 LAB — BASIC METABOLIC PANEL
Anion gap: 7 (ref 5–15)
BUN: 17 mg/dL (ref 6–23)
CHLORIDE: 107 mmol/L (ref 96–112)
CO2: 23 mmol/L (ref 19–32)
Calcium: 10.1 mg/dL (ref 8.4–10.5)
Creatinine, Ser: 0.72 mg/dL (ref 0.50–1.10)
GFR calc Af Amer: >90 mL/min (ref >90)
GLUCOSE: 126 mg/dL — AB (ref 70–99)
POTASSIUM: 3.8 mmol/L (ref 3.5–5.1)
Sodium: 137 mmol/L (ref 135–145)

## 2014-05-31 MED ORDER — MECLIZINE HCL 12.5 MG PO TABS
25.0000 mg | ORAL_TABLET | Freq: Once | ORAL | Status: AC
  Administered 2014-05-31: 25 mg via ORAL
  Filled 2014-05-31: qty 2

## 2014-05-31 MED ORDER — ALPRAZOLAM 0.5 MG PO TABS
1.0000 mg | ORAL_TABLET | Freq: Once | ORAL | Status: AC
  Administered 2014-05-31: 1 mg via ORAL
  Filled 2014-05-31: qty 2

## 2014-05-31 MED ORDER — MECLIZINE HCL 25 MG PO TABS: 25.0000 mg | ORAL_TABLET | Freq: Three times a day (TID) | ORAL | Status: DC | PRN

## 2014-05-31 NOTE — ED Notes (Signed)
PA at bedside.

## 2014-05-31 NOTE — ED Notes (Signed)
Pt comes in by [facility] with hypertension. Pt states she is compliant with medication. Denies any chestpain or headache. Pt states she has dizziness with standing. Pt is calm and cooperative, alert and oriented,

## 2014-05-31 NOTE — ED Notes (Signed)
PA made aware of pts vital signs.

## 2014-05-31 NOTE — Discharge Instructions (Signed)
Benign Positional Vertigo Vertigo means you feel like you or your surroundings are moving when they are not. Benign positional vertigo is the most common form of vertigo. Benign means that the cause of your condition is not serious. Benign positional vertigo is more common in older adults. CAUSES  Benign positional vertigo is the result of an upset in the labyrinth system. This is an area in the middle ear that helps control your balance. This may be caused by a viral infection, head injury, or repetitive motion. However, often no specific cause is found. SYMPTOMS  Symptoms of benign positional vertigo occur when you move your head or eyes in different directions. Some of the symptoms may include:  Loss of balance and falls.  Vomiting.  Blurred vision.  Dizziness.  Nausea.  Involuntary eye movements (nystagmus). DIAGNOSIS  Benign positional vertigo is usually diagnosed by physical exam. If the specific cause of your benign positional vertigo is unknown, your caregiver may perform imaging tests, such as magnetic resonance imaging (MRI) or computed tomography (CT). TREATMENT  Your caregiver may recommend movements or procedures to correct the benign positional vertigo. Medicines such as meclizine, benzodiazepines, and medicines for nausea may be used to treat your symptoms. In rare cases, if your symptoms are caused by certain conditions that affect the inner ear, you may need surgery. HOME CARE INSTRUCTIONS   Follow your caregiver's instructions.  Move slowly. Do not make sudden body or head movements.  Avoid driving.  Avoid operating heavy machinery.  Avoid performing any tasks that would be dangerous to you or others during a vertigo episode.  Drink enough fluids to keep your urine clear or pale yellow. SEEK IMMEDIATE MEDICAL CARE IF:   You develop problems with walking, weakness, numbness, or using your arms, hands, or legs.  You have difficulty speaking.  You develop  severe headaches.  Your nausea or vomiting continues or gets worse.  You develop visual changes.  Your family or friends notice any behavioral changes.  Your condition gets worse.  You have a fever.  You develop a stiff neck or sensitivity to light. MAKE SURE YOU:   Understand these instructions.  Will watch your condition.  Will get help right away if you are not doing well or get worse. Document Released: 11/27/2005 Document Revised: 05/14/2011 Document Reviewed: 11/09/2010 ExitCare Patient Information 2015 [employer]. This information is not intended to replace advice given to you by your health care provider. Make sure you discuss any questions you have with your health care provider.    

## 2014-05-31 NOTE — ED Provider Notes (Signed)
CSN: [id number]     Arrival date & time 05/31/14  0906 History   First MD Initiated Contact with Patient 05/31/14 [phone]     Chief Complaint  Patient presents with  . Hypertension     (Consider location/radiation/quality/duration/timing/severity/associated sxs/prior Treatment) HPI   Kaitlin [name] is a 59 y.o. female who presents to the Emergency Department complaining of elevated blood pressure.  She states that when she woke up this morning she felt "lightheaded and dizzy" improves when she remains still and closed her eyes.  She she attempted to sit up, she states "everything started spinning" so she laid back down and her husband called EMS.  She states that her blood pressure was 268'T systolic when EMS arrived and she took her verapamil this morning around 8:30 am.  She reports recently starting taking amoxil prescribed by her PMD for a sinus infection.  She denies vomiting, headache, focal weakness, chest pain, shortness of breath or difficulty speaking.  She reports dizziness symptoms with position change and improvement when she remains still.  She also reports feeling anxious this morning after the dizziness began, but has not taken her Xanax.  Past Medical History  Diagnosis Date  . BMI 30.0-30.9,adult 2008 182 LBS    2009 194 LBS  . Helicobacter pylori gastritis AUG 2008 PREVPAC: nausea-->HELIDAC: GI UPSET    HB 14.1 NL HFP H. PYLORI STOOL Ag NEG  . Colon polyp APR 2008  . Anxiety   . Essential hypertension, benign   . Hypothyroidism   . Mixed hyperlipidemia   . Irritable bowel syndrome   . Tubulovillous adenoma 08/07/10    Colonoscopy w/ Dr Oneida Alar w/ 7 polyps removed-bx showed tubular adenomas.  Largest 1cm-hepatic flexure polyp->TA.    Marland Kitchen Panic attacks   . Reflux   . Kidney stones   . Depression   . Folliculitis 06/21/6220  . Mixed stress and urge urinary incontinence 09/26/2012  . Polyp of colon, hyperplastic    Past Surgical History  Procedure Laterality Date  .  Colonoscopy  APR 2008    AC/[place] SIMPLE ADENOMA  . Upper gastrointestinal endoscopy  AUG 2008 PAIN/NAUSEA    H. PYLORI treated  . Cholecystectomy    . Mass excision  02/28/2011    Procedure: EXCISION MASS;  Surgeon: Donato Heinz, MD;  Location: [facility];  Service: General;  Laterality: Right;  soft tissue mass  . Colonoscopy  08/07/2010    Dr. Raynald Kemp adenomas and tubulovillous adenoma; needs surveillance 2015  . Colonoscopy N/A 09/18/2013    Dr.Rourk- normal rectum, 2 diminutive polys in the mid sigmoid segment o/w the remainder of the colonic mucosa appeared normal.hyperplastic poylps   Family History  Problem Relation Age of Onset  . Colon cancer Neg Hx   . Anesthesia problems Neg Hx   . Hypotension Neg Hx   . Malignant hyperthermia Neg Hx   . Pseudochol deficiency Neg Hx   . Coronary artery disease Mother   . Hypertension Mother   . Heart attack Mother   . Heart disease Mother   . Coronary artery disease Brother     Age 67  . Heart attack Brother   . Heart disease Brother   . Diabetes Sister   . Diabetes Sister    History  Substance Use Topics  . Smoking status: Never Smoker   . Smokeless tobacco: Never Used     Comment: Never Smoked  . Alcohol Use: No   OB History    Gravida Para Term  Preterm AB TAB SAB Ectopic Multiple Living   1 1             Review of Systems  Constitutional: Negative for fever, chills and fatigue.  HENT: Negative for sore throat and trouble swallowing.   Eyes: Negative for visual disturbance.  Respiratory: Negative for cough and shortness of breath.   Cardiovascular: Negative for chest pain and palpitations.  Gastrointestinal: Negative for nausea, vomiting and abdominal pain.  Genitourinary: Negative for dysuria and flank pain.  Musculoskeletal: Negative for myalgias, back pain, arthralgias, neck pain and neck stiffness.  Skin: Negative for rash.  Neurological: Positive for dizziness and light-headedness. Negative for seizures, syncope,  facial asymmetry, speech difficulty, weakness, numbness and headaches.  Hematological: Does not bruise/bleed easily.  Psychiatric/Behavioral: Negative for confusion.  All other systems reviewed and are negative.     Allergies  Asa; Augmentin; Levaquin; Rocephin; Valtrex; and Zoloft  Home Medications   Prior to Admission medications   Medication Sig Start Date End Date Taking? Authorizing Provider  ALPRAZolam Duanne Moron) 1 MG tablet Take 1 mg by mouth 3 (three) times daily.    Historical Provider, MD  amoxicillin (AMOXIL) 500 MG capsule Take 1 capsule (500 mg total) by mouth 3 (three) times daily. 05/11/14   [name], MD  enalapril (VASOTEC) 20 MG tablet Take 1 tablet (20 mg total) by mouth at bedtime. 05/11/14   [name], MD  etodolac (LODINE) 400 MG tablet Take 1 tablet (400 mg total) by mouth 2 (two) times daily. Take with food 05/11/14   [name], MD  furosemide (LASIX) 20 MG tablet Take 10 mg by mouth daily.    Historical Provider, MD  levothyroxine (SYNTHROID, LEVOTHROID) 50 MCG tablet Take 1 tablet (50 mcg total) by mouth daily. 05/11/14   [name], MD  lovastatin (MEVACOR) 40 MG tablet Take 1 tablet (40 mg total) by mouth at bedtime. 05/11/14   [name], MD  verapamil (CALAN-SR) 240 MG CR tablet Take 1 tablet (240 mg total) by mouth daily. 05/11/14   [name], MD   BP 165/75 mmHg  Pulse 108  Temp(Src) 97.7 F (36.5 C) (Oral)  Resp 16  Ht 5\' 5"  (1.651 m)  Wt 207 lb (93.895 kg)  BMI 34.45 kg/m2  SpO2 97% Physical Exam  Constitutional: She is oriented to person, place, and time. She appears well-developed and well-nourished. No distress.  Tearful on exam  HENT:  Head: Normocephalic and atraumatic.  Mouth/Throat: Oropharynx is clear and moist.  Eyes: Conjunctivae and EOM are normal. Pupils are equal, round, and reactive to light.  Neck: Normal range of motion. Neck supple.  Cardiovascular: Normal rate, regular rhythm, normal heart sounds and  intact distal pulses.   No murmur heard. Pulmonary/Chest: Effort normal and breath sounds normal. No respiratory distress. She exhibits no tenderness.  Abdominal: Soft. She exhibits no distension. There is no tenderness. There is no rebound and no guarding.  Musculoskeletal: Normal range of motion. She exhibits no tenderness.  Lymphadenopathy:    She has no cervical adenopathy.  Neurological: She is alert and oriented to person, place, and time. She has normal strength. No cranial nerve deficit or sensory deficit. She exhibits normal muscle tone. Coordination normal. GCS eye subscore is 4. GCS verbal subscore is 5. GCS motor subscore is 6.  Skin: Skin is warm and dry.  Psychiatric: Judgment and thought content normal.  Nursing note and vitals reviewed.   ED Course  Procedures (including critical  care time) Labs Review Labs Reviewed  BASIC METABOLIC PANEL - Abnormal; Notable for the following:    Glucose, Bld 126 (*)    All other components within normal limits  CBC WITH DIFFERENTIAL/PLATELET  URINALYSIS, ROUTINE W REFLEX MICROSCOPIC    Imaging Review No results found.   EKG Interpretation None        MDM   Final diagnoses:  Vertigo    Pt is feeling better after medications.  BP remains stable.  No orthostatic hypotension.  She has been talking with family members at bedside, drank oral fluids w/o difficulty.  Pt's family reported that she had eaten pork yesterday and concerned that may be reason for patient's current symptoms.    Vitals are stable, she is well appearing.  No concerning sx's for emergent neurological or cardiac process.  I have discussed pt with Dr. Wilson Singer.  Care plan discussed.  Pt and family agree to close f/u with PMD this week or to return here for any worsening sx's.  Appears stable for d/c    Patrice Paradise, PA-C 06/01/14 2232  Virgel Manifold, MD 06/02/14 [phone]

## 2014-06-04 ENCOUNTER — Ambulatory Visit (INDEPENDENT_AMBULATORY_CARE_PROVIDER_SITE_OTHER): Payer: 59 | Admitting: Family Medicine

## 2014-06-04 ENCOUNTER — Encounter: Payer: Self-pay | Admitting: Family Medicine

## 2014-06-04 VITALS — BP 130/86 | Ht 65.0 in | Wt 209.0 lb

## 2014-06-04 DIAGNOSIS — I10 Essential (primary) hypertension: Secondary | ICD-10-CM

## 2014-06-04 DIAGNOSIS — H8110 Benign paroxysmal vertigo, unspecified ear: Secondary | ICD-10-CM | POA: Diagnosis not present

## 2014-06-04 DIAGNOSIS — R7301 Impaired fasting glucose: Secondary | ICD-10-CM | POA: Diagnosis not present

## 2014-06-04 MED ORDER — AZITHROMYCIN 250 MG PO TABS: ORAL_TABLET | ORAL | Status: DC

## 2014-06-04 NOTE — Progress Notes (Signed)
   Subjective:    Patient ID: BRIELLA HOBDAY, female    DOB: 03/13/1955, 59 y.o.   MRN: [id number]  HPI  Patient arrives for a follow up from ER for Inner ear vertigo.  No nausea tod but some yesterday  Compliant with bp meds. Blood pressure was very high when brought in by EMS. She was concerned that she was experiencing a stroke.  Wonders about her blood work. Sugar was up a bit. Very worried about this. Admits some sugar excess and diet.  Current dizziness is spinning like sensation. Accompanied by nausea. Unsteadiness. Antivert prescribed Amerge helped some.  Full ER report reviewed in presence of patient    Review of Systems No headache no ear pain no cough no neck pain no loss of function no change in bowel habits    Objective:   Physical Exam  Alert vital signs stable blood pressure good on repeat 136 or 78 HEENT normal. Neck supple lungs clear heart regular in rhythm neuro intact      Assessment & Plan:  Impression 1 vertigo discussed at length #2 hypertension good control on current meds discussed #3 impaired fasting glucose discussed including diet plan maintain Antivert when necessary. Maintain other blood pressure medicine. Follow-up as originally scheduled. Diet exercise discussed. Blood work reviewed. 25 minutes spent most in discussion WSL

## 2014-06-05 ENCOUNTER — Other Ambulatory Visit: Payer: Self-pay | Admitting: Family Medicine

## 2014-06-28 ENCOUNTER — Encounter: Payer: Self-pay | Admitting: Family Medicine

## 2014-06-28 ENCOUNTER — Ambulatory Visit (INDEPENDENT_AMBULATORY_CARE_PROVIDER_SITE_OTHER): Payer: 59 | Admitting: Family Medicine

## 2014-06-28 VITALS — BP 120/82 | Temp 97.7°F | Ht 65.0 in | Wt 207.8 lb

## 2014-06-28 DIAGNOSIS — N3000 Acute cystitis without hematuria: Secondary | ICD-10-CM

## 2014-06-28 DIAGNOSIS — R300 Dysuria: Secondary | ICD-10-CM | POA: Diagnosis not present

## 2014-06-28 LAB — POCT URINALYSIS DIPSTICK
Spec Grav, UA: 1.02
pH, UA: 6

## 2014-06-28 MED ORDER — CIPROFLOXACIN HCL 500 MG PO TABS: 500.0000 mg | ORAL_TABLET | Freq: Two times a day (BID) | ORAL | Status: AC

## 2014-06-28 NOTE — Progress Notes (Signed)
   Subjective:    Patient ID: HARIKA LAIDLAW, female    DOB: 1955/03/06, 59 y.o.   MRN: [id number]  Dysuria  This is a new problem. The current episode started in the past 7 days. Associated symptoms include frequency. Associated symptoms comments: burning. Treatments tried: AZO.   Took azo, and started did not help  Felt like low gr fever  Pain not impacted by   Took ibuprofen prn  Feeling hot and cold   Burning with urination  And nocturia No vom some nausea     Review of Systems  Genitourinary: Positive for dysuria and frequency.   possible low-grade fevers some nausea right flank pain     Objective:   Physical Exam  Alert vital stable mild malaise. Lungs clear heart regular in rhythm right CVA tenderness  Urinalysis 4 white blood cells per high-power field no empties      Assessment & Plan:  Impression UTI plan Cipro twice a day 7 days. 500 mg with possible right kidney involvement symptom care discussed WSL

## 2014-07-04 ENCOUNTER — Other Ambulatory Visit: Payer: Self-pay | Admitting: Endocrinology

## 2014-07-05 NOTE — Telephone Encounter (Signed)
Please advise if ok to refill Rx. Medication is listed under a historical provider.  Thanks!

## 2014-07-06 ENCOUNTER — Encounter: Payer: Self-pay | Admitting: Obstetrics & Gynecology

## 2014-07-06 ENCOUNTER — Other Ambulatory Visit (HOSPITAL_COMMUNITY)
Admission: RE | Admit: 2014-07-06 | Discharge: 2014-07-06 | Disposition: A | Discharge status: Home / Self Care | Payer: 59 | Source: Ambulatory Visit | Attending: Obstetrics & Gynecology | Admitting: Obstetrics & Gynecology

## 2014-07-06 ENCOUNTER — Ambulatory Visit (INDEPENDENT_AMBULATORY_CARE_PROVIDER_SITE_OTHER): Payer: 59 | Admitting: Obstetrics & Gynecology

## 2014-07-06 VITALS — BP 100/70 | HR 72 | Ht 65.0 in | Wt 208.0 lb

## 2014-07-06 DIAGNOSIS — Z1151 Encounter for screening for human papillomavirus (HPV): Secondary | ICD-10-CM | POA: Insufficient documentation

## 2014-07-06 DIAGNOSIS — Z01419 Encounter for gynecological examination (general) (routine) without abnormal findings: Secondary | ICD-10-CM | POA: Insufficient documentation

## 2014-07-06 DIAGNOSIS — N3941 Urge incontinence: Secondary | ICD-10-CM

## 2014-07-06 MED ORDER — MIRABEGRON ER 50 MG PO TB24: 50.0000 mg | ORAL_TABLET | Freq: Every day | ORAL | Status: DC

## 2014-07-06 NOTE — Progress Notes (Signed)
Patient ID: Kaitlin [name], female   DOB: 03-23-1955, 59 y.o.   MRN: [id number] Subjective:     Kaitlin [name] is a 59 y.o. female here for a routine exam.  No LMP recorded. Patient is postmenopausal. G1P1 Birth Control Method:  pm Menstrual Calendar(currently): na  Current complaints: urge + stress incontinence.   Current acute medical issues:  none   Recent Gynecologic History No LMP recorded. Patient is postmenopausal. Last Pap: 2013,  normal Last mammogram: 2016,  normal  Past Medical History  Diagnosis Date  . BMI 30.0-30.9,adult 2008 182 LBS    2009 194 LBS  . Helicobacter pylori gastritis AUG 2008 PREVPAC: nausea-->HELIDAC: GI UPSET    HB 14.1 NL HFP H. PYLORI STOOL Ag NEG  . Colon polyp APR 2008  . Anxiety   . Essential hypertension, benign   . Hypothyroidism   . Mixed hyperlipidemia   . Irritable bowel syndrome   . Tubulovillous adenoma 08/07/10    Colonoscopy w/ Dr Oneida Alar w/ 7 polyps removed-bx showed tubular adenomas.  Largest 1cm-hepatic flexure polyp->TA.    Marland Kitchen Panic attacks   . Reflux   . Kidney stones   . Depression   . Folliculitis 7/82/9562  . Mixed stress and urge urinary incontinence 09/26/2012  . Polyp of colon, hyperplastic     Past Surgical History  Procedure Laterality Date  . Colonoscopy  APR 2008    AC/[place] SIMPLE ADENOMA  . Upper gastrointestinal endoscopy  AUG 2008 PAIN/NAUSEA    H. PYLORI treated  . Cholecystectomy    . Mass excision  02/28/2011    Procedure: EXCISION MASS;  Surgeon: Donato Heinz, MD;  Location: [facility];  Service: General;  Laterality: Right;  soft tissue mass  . Colonoscopy  08/07/2010    Dr. Raynald Kemp adenomas and tubulovillous adenoma; needs surveillance 2015  . Colonoscopy N/A 09/18/2013    Dr.Rourk- normal rectum, 2 diminutive polys in the mid sigmoid segment o/w the remainder of the colonic mucosa appeared normal.hyperplastic poylps    OB History    Gravida Para Term Preterm AB TAB SAB Ectopic Multiple Living   1 1              History   Social History  . Marital Status: Married    Spouse Name: N/A  . Number of Children: N/A  . Years of Education: N/A   Social History Main Topics  . Smoking status: Never Smoker   . Smokeless tobacco: Never Used     Comment: Never Smoked  . Alcohol Use: No  . Drug Use: No  . Sexual Activity: No   Other Topics Concern  . None   Social History Narrative    Family History  Problem Relation Age of Onset  . Colon cancer Neg Hx   . Anesthesia problems Neg Hx   . Hypotension Neg Hx   . Malignant hyperthermia Neg Hx   . Pseudochol deficiency Neg Hx   . Coronary artery disease Mother   . Hypertension Mother   . Heart attack Mother   . Heart disease Mother   . Coronary artery disease Brother     Age 23  . Heart attack Brother   . Heart disease Brother   . Diabetes Sister   . Diabetes Sister      Current outpatient prescriptions:  .  ALPRAZolam (XANAX) 1 MG tablet, Take 1 mg by mouth 3 (three) times daily., Disp: , Rfl:  .  ciprofloxacin (CIPRO) 500 MG tablet, Take  1 tablet (500 mg total) by mouth 2 (two) times daily., Disp: 14 tablet, Rfl: 0 .  enalapril (VASOTEC) 20 MG tablet, Take 1 tablet (20 mg total) by mouth at bedtime., Disp: 30 tablet, Rfl: 5 .  etodolac (LODINE) 400 MG tablet, Take 1 tablet (400 mg total) by mouth 2 (two) times daily. Take with food, Disp: 28 tablet, Rfl: 0 .  furosemide (LASIX) 20 MG tablet, Take 10 mg by mouth daily., Disp: , Rfl:  .  levothyroxine (SYNTHROID, LEVOTHROID) 50 MCG tablet, Take 1 tablet (50 mcg total) by mouth daily., Disp: 30 tablet, Rfl: 5 .  lovastatin (MEVACOR) 40 MG tablet, Take 1 tablet (40 mg total) by mouth at bedtime., Disp: 30 tablet, Rfl: 5 .  meclizine (ANTIVERT) 25 MG tablet, TAKE 1 TABLET 3 TIMES A DAY AS NEEDED DIZZINESS, Disp: 12 tablet, Rfl: 0 .  verapamil (CALAN-SR) 240 MG CR tablet, Take 1 tablet (240 mg total) by mouth daily., Disp: 30 tablet, Rfl: 5 .  furosemide (LASIX) 20 MG  tablet, TAKE HALF A TABLET DAILY (Patient not taking: Reported on 07/06/2014), Disp: 30 tablet, Rfl: 2  Review of Systems  Review of Systems  Constitutional: Negative for fever, chills, weight loss, malaise/fatigue and diaphoresis.  HENT: Negative for hearing loss, ear pain, nosebleeds, congestion, sore throat, neck pain, tinnitus and ear discharge.   Eyes: Negative for blurred vision, double vision, photophobia, pain, discharge and redness.  Respiratory: Negative for cough, hemoptysis, sputum production, shortness of breath, wheezing and stridor.   Cardiovascular: Negative for chest pain, palpitations, orthopnea, claudication, leg swelling and PND.  Gastrointestinal: negative for abdominal pain. Negative for heartburn, nausea, vomiting, diarrhea, constipation, blood in stool and melena.  Genitourinary: Negative for dysuria, urgency, frequency, hematuria and flank pain.  Musculoskeletal: Negative for myalgias, back pain, joint pain and falls.  Skin: Negative for itching and rash.  Neurological: Negative for dizziness, tingling, tremors, sensory change, speech change, focal weakness, seizures, loss of consciousness, weakness and headaches.  Endo/Heme/Allergies: Negative for environmental allergies and polydipsia. Does not bruise/bleed easily.  Psychiatric/Behavioral: Negative for depression, suicidal ideas, hallucinations, memory loss and substance abuse. The patient is not nervous/anxious and does not have insomnia.        Objective:  Blood pressure 100/70, pulse 72, height 5\' 5"  (1.651 m), weight 208 lb (94.348 kg).   Physical Exam  Vitals reviewed. Constitutional: She is oriented to person, place, and time. She appears well-developed and well-nourished.  HENT:  Head: Normocephalic and atraumatic.        Right Ear: External ear normal.  Left Ear: External ear normal.  Nose: Nose normal.  Mouth/Throat: Oropharynx is clear and moist.  Eyes: Conjunctivae and EOM are normal. Pupils are  equal, round, and reactive to light. Right eye exhibits no discharge. Left eye exhibits no discharge. No scleral icterus.  Neck: Normal range of motion. Neck supple. No tracheal deviation present. No thyromegaly present.  Cardiovascular: Normal rate, regular rhythm, normal heart sounds and intact distal pulses.  Exam reveals no gallop and no friction rub.   No murmur heard. Respiratory: Effort normal and breath sounds normal. No respiratory distress. She has no wheezes. She has no rales. She exhibits no tenderness.  GI: Soft. Bowel sounds are normal. She exhibits no distension and no mass. There is no tenderness. There is no rebound and no guarding.  Genitourinary:  Breasts no masses skin changes or nipple changes bilaterally      Vulva is normal without lesions Vagina is pink moist  without discharge Cervix normal in appearance and pap is done Uterus is normal size shape and contour Adnexa is negative with normal sized ovaries   Musculoskeletal: Normal range of motion. She exhibits no edema and no tenderness.  Neurological: She is alert and oriented to person, place, and time. She has normal reflexes. She displays normal reflexes. No cranial nerve deficit. She exhibits normal muscle tone. Coordination normal.  Skin: Skin is warm and dry. No rash noted. No erythema. No pallor.  Psychiatric: She has a normal mood and affect. Her behavior is normal. Judgment and thought content normal.       Assessment:    Healthy female exam.    Plan:    Follow up in: 6 weeks. mybetriq 50 qhs

## 2014-07-07 LAB — CYTOLOGY - PAP

## 2014-07-20 ENCOUNTER — Encounter: Payer: Self-pay | Admitting: Internal Medicine

## 2014-07-26 ENCOUNTER — Telehealth: Payer: Self-pay | Admitting: *Deleted

## 2014-07-26 NOTE — Telephone Encounter (Signed)
Called pt insurance for PA for Myrbetriq.  50 mg. Informed pt insurance will cover Oxybutynin as an alternative. Please advise.

## 2014-08-17 ENCOUNTER — Ambulatory Visit: Payer: 59 | Admitting: Obstetrics & Gynecology

## 2014-08-24 ENCOUNTER — Ambulatory Visit (INDEPENDENT_AMBULATORY_CARE_PROVIDER_SITE_OTHER): Payer: 59 | Admitting: Obstetrics & Gynecology

## 2014-08-24 ENCOUNTER — Other Ambulatory Visit: Payer: Self-pay | Admitting: Obstetrics & Gynecology

## 2014-08-24 ENCOUNTER — Encounter: Payer: Self-pay | Admitting: Obstetrics & Gynecology

## 2014-08-24 VITALS — BP 128/70 | HR 76 | Wt 202.0 lb

## 2014-08-24 DIAGNOSIS — E878 Other disorders of electrolyte and fluid balance, not elsewhere classified: Secondary | ICD-10-CM

## 2014-08-24 DIAGNOSIS — R350 Frequency of micturition: Secondary | ICD-10-CM

## 2014-08-24 LAB — POCT URINALYSIS DIPSTICK
Blood, UA: NEGATIVE
GLUCOSE UA: NEGATIVE
Ketones, UA: NEGATIVE
Leukocytes, UA: NEGATIVE
Nitrite, UA: NEGATIVE
PROTEIN UA: NEGATIVE

## 2014-08-24 MED ORDER — SOLIFENACIN SUCCINATE 10 MG PO TABS: 10.0000 mg | ORAL_TABLET | Freq: Every day | ORAL | Status: DC

## 2014-08-24 NOTE — Progress Notes (Signed)
Patient ID: Kaitlin [name], female   DOB: 1955/08/27, 59 y.o.   MRN: [id number]  Chief Complaint  Patient presents with  . gyn visit    c/c excessive peeing/ with odor.feeling bad due to urine lose.    Blood pressure 128/70, pulse 76, weight 202 lb (91.627 kg).  59 y.o. G1P1 No LMP recorded. Patient is postmenopausal. The current method of family planning is na.  Subjective Odor strong urine Frequency urgency no dysuria No foul odor  No discharge  Objective Vulva:  normal appearing vulva with no masses, tenderness or lesions Vagina:  normal mucosa, no discharge, normal mucosa, scant blood Cervix:  no cervical motion tenderness and no lesions Uterus:   Adnexa: ovaries:,      Pertinent ROS No burning with urination,  No nausea, vomiting or diarrhea Nor fever chills or other constitutional symptoms   Labs or studies UA negative  Impression Diagnoses this Encounter::   ICD-9-CM ICD-10-CM   1. Electrolyte disturbance 276.9 K87.6 COMPLETE METABOLIC PANEL WITH GFR  2. Urinary frequency 788.41 R35.0 POCT urinalysis dipstick    Established relevant diagnosis(es):  Plan/Recommendations Meds ordered this encounter  Medications  . solifenacin (VESICARE) 10 MG tablet    Sig: Take 1 tablet (10 mg total) by mouth daily.    Dispense:  30 tablet    Refill:  11     Follow up prn      Face to face time:  15 minutes  Greater than 50% of the visit time was spent in counseling and coordination of care with the patient.  The summary and outline of the counseling and care coordination is summarized in the note above.   All questions were answered.

## 2014-08-25 LAB — COMPREHENSIVE METABOLIC PANEL
ALT: 28 IU/L (ref 0–32)
AST: 26 IU/L (ref 0–40)
Albumin/Globulin Ratio: 1.7 (ref 1.1–2.5)
Albumin: 4.5 g/dL (ref 3.5–5.5)
Alkaline Phosphatase: 71 IU/L (ref 39–117)
BUN / CREAT RATIO: 21 (ref 9–23)
BUN: 15 mg/dL (ref 6–24)
Bilirubin Total: 0.3 mg/dL (ref 0.0–1.2)
CHLORIDE: 103 mmol/L (ref 97–108)
CO2: 25 mmol/L (ref 18–29)
Calcium: 10.5 mg/dL — ABNORMAL HIGH (ref 8.7–10.2)
Creatinine, Ser: 0.73 mg/dL (ref 0.57–1.00)
GFR calc Af Amer: 106 mL/min/{1.73_m2} (ref >59)
GFR, EST NON AFRICAN AMERICAN: 92 mL/min/{1.73_m2} (ref >59)
Globulin, Total: 2.7 g/dL (ref 1.5–4.5)
Glucose: 87 mg/dL (ref 65–99)
Potassium: 4.3 mmol/L (ref 3.5–5.2)
Sodium: 140 mmol/L (ref 134–144)
TOTAL PROTEIN: 7.2 g/dL (ref 6.0–8.5)

## 2014-08-25 LAB — MAGNESIUM: MAGNESIUM: 2.2 mg/dL (ref 1.6–2.3)

## 2014-08-26 ENCOUNTER — Telehealth: Payer: Self-pay | Admitting: *Deleted

## 2014-08-26 NOTE — Telephone Encounter (Signed)
Prior Authorization done for Vesicare 10 MG, informed will be contacted if approved or denied by fax.

## 2014-08-27 ENCOUNTER — Telehealth: Payer: Self-pay | Admitting: *Deleted

## 2014-08-27 MED ORDER — FESOTERODINE FUMARATE ER 8 MG PO TB24
8.0000 mg | ORAL_TABLET | Freq: Every day | ORAL | Status: DC
Start: 2014-08-27 — End: 2014-11-15

## 2014-08-27 NOTE — Telephone Encounter (Signed)
Vesicare not covered by pt insurance, will cover oxybutynin, Ditropan, or Toviaz. Please advise.

## 2014-08-27 NOTE — Telephone Encounter (Signed)
Pt informed Toviaz e-scribed.

## 2014-08-31 ENCOUNTER — Encounter: Payer: Self-pay | Admitting: Family Medicine

## 2014-08-31 ENCOUNTER — Ambulatory Visit (INDEPENDENT_AMBULATORY_CARE_PROVIDER_SITE_OTHER): Payer: 59 | Admitting: Family Medicine

## 2014-08-31 VITALS — BP 118/70 | Ht 65.0 in | Wt 204.0 lb

## 2014-08-31 DIAGNOSIS — I878 Other specified disorders of veins: Secondary | ICD-10-CM

## 2014-08-31 DIAGNOSIS — R7989 Other specified abnormal findings of blood chemistry: Secondary | ICD-10-CM

## 2014-08-31 DIAGNOSIS — I10 Essential (primary) hypertension: Secondary | ICD-10-CM

## 2014-08-31 DIAGNOSIS — E349 Endocrine disorder, unspecified: Secondary | ICD-10-CM | POA: Diagnosis not present

## 2014-08-31 NOTE — Progress Notes (Signed)
   Subjective:    Patient ID: Kaitlin [name], female    DOB: [birth year], 59 y.o.   MRN: [id number] Patient arrives office with numerous concerns and for a lengthy discussion. HPI  Patient is today to discuss Lasix. Patient has concerns of excessive fluid loss and frequent urination. Patient recently saw her OB GYN Dr. Elonda Husky on 6/21 for her overactive bladder.   Patient states that OB GYN was looking for treatment options for overactive bladder but feels that Lasix may be to strong and wanted patient to follow up with [facility].   Recent labs were ordered by Dr. Elonda Husky for fluid loss. Patient states no concerns this visit.  Dr Arnoldo Lenis changed fluid pill to lasix, said to help the calcium.  BP numbers good when cked elsewhere  Felt drained and urinating a lot  Dr Elonda Husky did b w to assess fluid pill   Pt notes no sig swelling, urinating frequently  havent started vesicare yet    Review of Systems No headache no chest pain no back pain no change in bowel habits    Objective:   Physical Exam Alert vitals stable. Lungs clear. Heart regular in rhythm. HEENT normal. Ankles trace edema       Assessment & Plan:  Impression #1 hypertension control overall good #2 dehydration/hypovolemia no evidence of this at this time. #3 hypercalcemia. I advised patient that Dr. Loanne Drilling started her on Lasix not to increase fluid loss but rather to normalize or help calcium. Calcium still elevated discussed plan maintain same medications for now. Multiple questions answered. Encouraged to follow-up with her endocrinologist Dr. Elveria Rising

## 2014-09-03 ENCOUNTER — Other Ambulatory Visit: Payer: Self-pay | Admitting: Family Medicine

## 2014-09-16 ENCOUNTER — Ambulatory Visit (INDEPENDENT_AMBULATORY_CARE_PROVIDER_SITE_OTHER): Payer: BLUE CROSS/BLUE SHIELD | Admitting: Endocrinology

## 2014-09-16 ENCOUNTER — Encounter: Payer: Self-pay | Admitting: Endocrinology

## 2014-09-16 VITALS — BP 128/85 | HR 79 | Temp 98.4°F | Ht 65.0 in | Wt 202.0 lb

## 2014-09-16 DIAGNOSIS — E349 Endocrine disorder, unspecified: Secondary | ICD-10-CM

## 2014-09-16 NOTE — Progress Notes (Signed)
Subjective:    Patient ID: Kaitlin [name], female    DOB: [birth year], 59 y.o.   MRN: [id number]  HPI The state of at least three ongoing medical problems is addressed today, with interval history of each noted here: Hyperparathyroidism: (first noted in 2014; she does not take vitamin-D or A supplements.  She had resection of a urolith in approx 2000).  She denies hematuria Hypercalcemia: (first noted in 2012; she has never had PUD, pancreatitis, or bony fracture; DEXA was normal in early 2015; in sept of 2015, lozol was changed to lasix).  she gets a significant diuretic effect from lasix HTN: Denies sob   Past Medical History  Diagnosis Date  . BMI 30.0-30.9,adult 2008 182 LBS    2009 194 LBS  . Helicobacter pylori gastritis AUG 2008 PREVPAC: nausea-->HELIDAC: GI UPSET    HB 14.1 NL HFP H. PYLORI STOOL Ag NEG  . Colon polyp APR 2008  . Anxiety   . Essential hypertension, benign   . Hypothyroidism   . Mixed hyperlipidemia   . Irritable bowel syndrome   . Tubulovillous adenoma 08/07/10    Colonoscopy w/ Dr Oneida Alar w/ 7 polyps removed-bx showed tubular adenomas.  Largest 1cm-hepatic flexure polyp->TA.    Marland Kitchen Panic attacks   . Reflux   . Kidney stones   . Depression   . Folliculitis 7/51/0258  . Mixed stress and urge urinary incontinence 09/26/2012  . Polyp of colon, hyperplastic     Past Surgical History  Procedure Laterality Date  . Colonoscopy  APR 2008    AC/[place] SIMPLE ADENOMA  . Upper gastrointestinal endoscopy  AUG 2008 PAIN/NAUSEA    H. PYLORI treated  . Cholecystectomy    . Mass excision  02/28/2011    Procedure: EXCISION MASS;  Surgeon: Donato Heinz, MD;  Location: [facility];  Service: General;  Laterality: Right;  soft tissue mass  . Colonoscopy  08/07/2010    Dr. Raynald Kemp adenomas and tubulovillous adenoma; needs surveillance 2015  . Colonoscopy N/A 09/18/2013    Dr.Rourk- normal rectum, 2 diminutive polys in the mid sigmoid segment o/w the remainder of the  colonic mucosa appeared normal.hyperplastic poylps    History   Social History  . Marital Status: Married    Spouse Name: N/A  . Number of Children: N/A  . Years of Education: N/A   Occupational History  . Not on file.   Social History Main Topics  . Smoking status: Never Smoker   . Smokeless tobacco: Never Used     Comment: Never Smoked  . Alcohol Use: No  . Drug Use: No  . Sexual Activity: No   Other Topics Concern  . Not on file   Social History Narrative    Current Outpatient Prescriptions on File Prior to Visit  Medication Sig Dispense Refill  . ALPRAZolam (XANAX) 1 MG tablet Take 1 mg by mouth 3 (three) times daily.    . enalapril (VASOTEC) 20 MG tablet Take 1 tablet (20 mg total) by mouth at bedtime. 30 tablet 5  . etodolac (LODINE) 400 MG tablet Take 1 tablet (400 mg total) by mouth 2 (two) times daily. Take with food 28 tablet 0  . fesoterodine (TOVIAZ) 8 MG TB24 tablet Take 1 tablet (8 mg total) by mouth daily. 30 tablet 11  . furosemide (LASIX) 20 MG tablet Take 10 mg by mouth daily.    Marland Kitchen levothyroxine (SYNTHROID, LEVOTHROID) 50 MCG tablet TAKE 1 TABLET EVERY DAY 30 tablet 5  . lovastatin (  MEVACOR) 40 MG tablet Take 1 tablet (40 mg total) by mouth at bedtime. 30 tablet 5  . meclizine (ANTIVERT) 25 MG tablet TAKE 1 TABLET 3 TIMES A DAY AS NEEDED DIZZINESS 12 tablet 0  . mirabegron ER (MYRBETRIQ) 50 MG TB24 tablet Take 1 tablet (50 mg total) by mouth daily. 30 tablet 11  . solifenacin (VESICARE) 10 MG tablet Take 1 tablet (10 mg total) by mouth daily. 30 tablet 11  . verapamil (CALAN-SR) 240 MG CR tablet Take 1 tablet (240 mg total) by mouth daily. 30 tablet 5   No current facility-administered medications on file prior to visit.    Allergies  Allergen Reactions  . Asa [Aspirin] Other (See Comments)    Increases anxiety  . Augmentin [Amoxicillin-Pot Clavulanate] Diarrhea  . Levaquin [Levofloxacin In D5w] Other (See Comments)    chestpain  . Rocephin  [Ceftriaxone Sodium In Dextrose] Hives and Other (See Comments)    blisters  . Valtrex [Valacyclovir Hcl] Hives and Other (See Comments)  . Zoloft [Sertraline Hcl] Other (See Comments)    Too strong    Family History  Problem Relation Age of Onset  . Colon cancer Neg Hx   . Anesthesia problems Neg Hx   . Hypotension Neg Hx   . Malignant hyperthermia Neg Hx   . Pseudochol deficiency Neg Hx   . Coronary artery disease Mother   . Hypertension Mother   . Heart attack Mother   . Heart disease Mother   . Coronary artery disease Brother     Age 53  . Heart attack Brother   . Heart disease Brother   . Diabetes Sister   . Diabetes Sister     BP 128/85 mmHg  Pulse 79  Temp(Src) 98.4 F (36.9 C) (Oral)  Ht 5\' 5"  (1.651 m)  Wt 202 lb (91.627 kg)  BMI 33.61 kg/m2  SpO2 97%  Review of Systems Denies muscle cramps.  She has slight edema of the legs.     Objective:   Physical Exam VITAL SIGNS:  See vs page GENERAL: no distress Ext: 1+ bilat leg edema.    Lab Results  Component Value Date   PTH 45 05/14/2014   PTH Comment 05/14/2014   CALCIUM 10.5* 08/24/2014      Assessment & Plan:  HTN: well-controlled off HCTZ. Hyperparathyroidism: uncertain etiology.  Much better at last check, but needs needs recheck Hypercalcemia: improved off HCTZ: due for recheck.     Patient is advised the following: Patient Instructions  blood tests are being requested for you today.  We'll contact you with results.   Also, let's check a 24-HR urine for calcium.

## 2014-09-16 NOTE — Patient Instructions (Addendum)
blood tests are being requested for you today.  We'll contact you with results.   Also, let's check a 24-HR urine for calcium.

## 2014-09-17 LAB — VITAMIN D 25 HYDROXY (VIT D DEFICIENCY, FRACTURES): VITD: 20.04 ng/mL — ABNORMAL LOW (ref 30.00–100.00)

## 2014-09-17 LAB — PTH, INTACT AND CALCIUM
CALCIUM: 10.6 mg/dL — AB (ref 8.4–10.5)
PTH: 60 pg/mL (ref 14–64)

## 2014-09-21 ENCOUNTER — Other Ambulatory Visit: Payer: Self-pay | Admitting: Endocrinology

## 2014-09-21 LAB — CREATININE, URINE, 24 HOUR
Creatinine, 24H Ur: 1162 mg/d (ref 700–1800)
Creatinine, Urine: 89.4 mg/dL

## 2014-09-21 LAB — CALCIUM, URINE, 24 HOUR
CALCIUM 24HR UR: 286 mg/d — AB (ref 100–250)
CALCIUM UR: 22 mg/dL

## 2014-09-21 MED ORDER — VITAMIN D (ERGOCALCIFEROL) 1.25 MG (50000 UNIT) PO CAPS: 50000.0000 [IU] | ORAL_CAPSULE | ORAL | Status: DC

## 2014-11-01 ENCOUNTER — Other Ambulatory Visit: Payer: Self-pay | Admitting: Family Medicine

## 2014-11-02 ENCOUNTER — Other Ambulatory Visit: Payer: Self-pay

## 2014-11-02 ENCOUNTER — Ambulatory Visit (INDEPENDENT_AMBULATORY_CARE_PROVIDER_SITE_OTHER): Payer: BLUE CROSS/BLUE SHIELD | Admitting: Internal Medicine

## 2014-11-02 ENCOUNTER — Encounter: Payer: Self-pay | Admitting: Internal Medicine

## 2014-11-02 VITALS — BP 142/80 | HR 68 | Temp 97.4°F | Ht 65.0 in | Wt 207.2 lb

## 2014-11-02 DIAGNOSIS — G8929 Other chronic pain: Secondary | ICD-10-CM

## 2014-11-02 DIAGNOSIS — K219 Gastro-esophageal reflux disease without esophagitis: Secondary | ICD-10-CM

## 2014-11-02 DIAGNOSIS — R1013 Epigastric pain: Secondary | ICD-10-CM | POA: Diagnosis not present

## 2014-11-02 DIAGNOSIS — K5900 Constipation, unspecified: Secondary | ICD-10-CM | POA: Diagnosis not present

## 2014-11-02 NOTE — Progress Notes (Signed)
Primary Care Physician:  Mickie Hillier, MD Primary Gastroenterologist:  Dr. Gala Romney  Pre-Procedure History & Physical: HPI:  Kaitlin [name] is a 59 y.o. female here for evaluation of the epigastric pain and insidiously worsening GERD. Patient states she has heartburn "all the timef" ; takes her husband's Protonix frequently these days. No dysphagia or odynophagia. No recent EGD. She is significantly over her ideal body weight. Has some associated epigastric and left upper quadrant abdominal pain. Chronically constipated; tried linzess 145 7 or 8 months ago for a short period of time. Felt it worked but doesn't recall any specific details. Do not feel that MiraLax worked. History of advanced adenomas removed previously;  Good in 2015; due for surveillance colonoscopy 2020.  Past Medical History  Diagnosis Date  . BMI 30.0-30.9,adult 2008 182 LBS    2009 194 LBS  . Helicobacter pylori gastritis AUG 2008 PREVPAC: nausea-->HELIDAC: GI UPSET    HB 14.1 NL HFP H. PYLORI STOOL Ag NEG  . Colon polyp APR 2008  . Anxiety   . Essential hypertension, benign   . Hypothyroidism   . Mixed hyperlipidemia   . Irritable bowel syndrome   . Tubulovillous adenoma 08/07/10    Colonoscopy w/ Dr Oneida Alar w/ 7 polyps removed-bx showed tubular adenomas.  Largest 1cm-hepatic flexure polyp->TA.    Marland Kitchen Panic attacks   . Reflux   . Kidney stones   . Depression   . Folliculitis 06/11/8117  . Mixed stress and urge urinary incontinence 09/26/2012  . Polyp of colon, hyperplastic     Past Surgical History  Procedure Laterality Date  . Colonoscopy  APR 2008    AC/[place] SIMPLE ADENOMA  . Upper gastrointestinal endoscopy  AUG 2008 PAIN/NAUSEA    H. PYLORI treated  . Cholecystectomy    . Mass excision  02/28/2011    Procedure: EXCISION MASS;  Surgeon: Donato Heinz, MD;  Location: [facility];  Service: General;  Laterality: Right;  soft tissue mass  . Colonoscopy  08/07/2010    Dr. Raynald Kemp adenomas and tubulovillous  adenoma; needs surveillance 2015  . Colonoscopy N/A 09/18/2013    Dr.Kissy Cielo- normal rectum, 2 diminutive polys in the mid sigmoid segment o/w the remainder of the colonic mucosa appeared normal.hyperplastic poylps    Prior to Admission medications   Medication Sig Start Date End Date Taking? Authorizing Provider  ALPRAZolam Duanne Moron) 1 MG tablet Take 1 mg by mouth 3 (three) times daily.   Yes Historical Provider, MD  enalapril (VASOTEC) 20 MG tablet Take 1 tablet (20 mg total) by mouth at bedtime. 05/11/14  Yes [name], MD  furosemide (LASIX) 20 MG tablet Take 10 mg by mouth daily.   Yes Historical Provider, MD  levothyroxine (SYNTHROID, LEVOTHROID) 50 MCG tablet TAKE 1 TABLET EVERY DAY 11/01/14  Yes [name], MD  lovastatin (MEVACOR) 40 MG tablet TAKE 1 TABLET (40 MG TOTAL) BY MOUTH AT BEDTIME. 11/01/14  Yes [name], MD  meclizine (ANTIVERT) 25 MG tablet TAKE 1 TABLET 3 TIMES A DAY AS NEEDED DIZZINESS 06/07/14  Yes [name], MD  verapamil (CALAN-SR) 240 MG CR tablet Take 1 tablet (240 mg total) by mouth daily. 05/11/14  Yes [name], MD  Vitamin D, Ergocalciferol, (DRISDOL) 50000 UNITS CAPS capsule Take 1 capsule (50,000 Units total) by mouth every 7 (seven) days. 09/21/14  Yes Renato Shin, MD  etodolac (LODINE) 400 MG tablet Take 1 tablet (400 mg total) by mouth 2 (two) times daily. Take  with food Patient not taking: Reported on 11/02/2014 05/11/14   [name], MD  fesoterodine (TOVIAZ) 8 MG TB24 tablet Take 1 tablet (8 mg total) by mouth daily. Patient not taking: Reported on 11/02/2014 08/27/14   Florian Buff, MD  mirabegron ER (MYRBETRIQ) 50 MG TB24 tablet Take 1 tablet (50 mg total) by mouth daily. Patient not taking: Reported on 11/02/2014 07/06/14   Florian Buff, MD  solifenacin (VESICARE) 10 MG tablet Take 1 tablet (10 mg total) by mouth daily. Patient not taking: Reported on 11/02/2014 08/24/14   Florian Buff, MD    Allergies as of 11/02/2014 - Review  Complete 11/02/2014  Allergen Reaction Noted  . Asa [aspirin] Other (See Comments) 06/09/2012  . Augmentin [amoxicillin-pot clavulanate] Diarrhea 06/09/2012  . Levaquin [levofloxacin in d5w] Other (See Comments) 07/14/2012  . Rocephin [ceftriaxone sodium in dextrose] Hives and Other (See Comments) 06/09/2012  . Valtrex [valacyclovir hcl] Hives and Other (See Comments) 06/09/2012  . Zoloft [sertraline hcl] Other (See Comments) 06/09/2012    Family History  Problem Relation Age of Onset  . Colon cancer Neg Hx   . Anesthesia problems Neg Hx   . Hypotension Neg Hx   . Malignant hyperthermia Neg Hx   . Pseudochol deficiency Neg Hx   . Coronary artery disease Mother   . Hypertension Mother   . Heart attack Mother   . Heart disease Mother   . Coronary artery disease Brother     Age 93  . Heart attack Brother   . Heart disease Brother   . Diabetes Sister   . Diabetes Sister     Social History   Social History  . Marital Status: Married    Spouse Name: N/A  . Number of Children: N/A  . Years of Education: N/A   Occupational History  . Not on file.   Social History Main Topics  . Smoking status: Never Smoker   . Smokeless tobacco: Never Used     Comment: Never Smoked  . Alcohol Use: No  . Drug Use: No  . Sexual Activity: No   Other Topics Concern  . Not on file   Social History Narrative    Review of Systems: See HPI, otherwise negative ROS  Physical Exam: BP 142/80 mmHg  Pulse 68  Temp(Src) 97.4 F (36.3 C)  Ht 5\' 5"  (1.651 m)  Wt 207 lb 3.2 oz (93.985 kg)  BMI 34.48 kg/m2 General:   Alert,  Well-developed, well-nourished, pleasant and cooperative in NAD Skin:  Intact without significant lesions or rashes. Neck:  Supple; no masses or thyromegaly. No significant cervical adenopathy. Lungs:  Clear throughout to auscultation.   No wheezes, crackles, or rhonchi. No acute distress. Heart:  Regular rate and rhythm; no murmurs, clicks, rubs,  or gallops. Abdomen:     obese. Positive bowel sounds.  Soft with minimal epigastric left upper quadrant tenderness to palpation. No appreciable mass or organomegaly. Pulses:  Normal pulses noted. Extremities:  Without clubbing or edema.  Impression:  Pleasant 59 year old lady with prominent, nearly daily, reflux symptoms responsive to PPI therapy. Given the tenacity of her symptoms and her age, she ought to have a diagnostic EGD to take a look at her esophagus. Moreover, nonspecific epigastric/ left upper quadrant abdominal pain may be related to acid peptic disease or other process. Alternatively, it could be due to inadequately treated constipation.  Recommendations:   Schedule diagnostic EGD - chronic GERD / abdominal.  Phenergan 12.5 mg IV in  short stay prior to EGD to augment conscious sedation.The risks, benefits, limitations, alternatives and imponderables have been reviewed with the patient. Potential for esophageal dilation, biopsy, etc. have also been reviewed.  Questions have been answered. All parties agreeable.  Trial of Linzess 290 - one capsule daily for constipation  Constipation and GERD information provided today.    Notice: This dictation was prepared with Dragon dictation along with smaller phrase technology. Any transcriptional errors that result from this process are unintentional and may not be corrected upon review.

## 2014-11-02 NOTE — Patient Instructions (Signed)
Schedule diagnostic EGD - chronic GERD  Phenergan 12.5 mg IV in short stay prior to EGD  Trial of Linzess 290 - one capsule daily for constipation  Constipation and GERD information

## 2014-11-12 ENCOUNTER — Encounter: Payer: Self-pay | Admitting: Family Medicine

## 2014-11-12 ENCOUNTER — Ambulatory Visit (INDEPENDENT_AMBULATORY_CARE_PROVIDER_SITE_OTHER): Payer: BLUE CROSS/BLUE SHIELD | Admitting: Family Medicine

## 2014-11-12 VITALS — BP 122/82 | Ht 65.0 in | Wt 210.0 lb

## 2014-11-12 DIAGNOSIS — E039 Hypothyroidism, unspecified: Secondary | ICD-10-CM | POA: Diagnosis not present

## 2014-11-12 DIAGNOSIS — I10 Essential (primary) hypertension: Secondary | ICD-10-CM | POA: Diagnosis not present

## 2014-11-12 DIAGNOSIS — Z79899 Other long term (current) drug therapy: Secondary | ICD-10-CM

## 2014-11-12 DIAGNOSIS — R7302 Impaired glucose tolerance (oral): Secondary | ICD-10-CM | POA: Diagnosis not present

## 2014-11-12 MED ORDER — VERAPAMIL HCL ER 240 MG PO TBCR: 240.0000 mg | EXTENDED_RELEASE_TABLET | Freq: Every day | ORAL | Status: DC

## 2014-11-12 MED ORDER — ENALAPRIL MALEATE 20 MG PO TABS: 20.0000 mg | ORAL_TABLET | Freq: Every day | ORAL | Status: DC

## 2014-11-12 MED ORDER — LOVASTATIN 40 MG PO TABS: ORAL_TABLET | ORAL | Status: DC

## 2014-11-12 MED ORDER — LEVOTHYROXINE SODIUM 50 MCG PO TABS: 50.0000 ug | ORAL_TABLET | Freq: Every day | ORAL | Status: DC

## 2014-11-12 NOTE — Progress Notes (Signed)
   Subjective:    Patient ID: Kaitlin [name], female    DOB: 09/14/1955, 59 y.o.   MRN: [id number]  Hypertension This is a chronic problem. The current episode started more than 1 year ago. The problem has been gradually improving since onset. There are no associated agents to hypertension. There are no known risk factors for coronary artery disease. Treatments tried: enalapril. The current treatment provides moderate improvement. There are no compliance problems.      patient has history of hypothyroidism. Recent fatigue. Claims compliance with thyroid medicine. Concerned her thyroid may be out of whack.  Compliant with her lipid medicine. Mostly cutting down fats in diet. Exercising fairly regularly with work. Patient states that she has no additional concerns at this time.   Patient working on sugar intake. Has cut down sugars. Concerned about her prediabetes wonder status  Walks a lot on the job  Results for orders placed or performed in visit on 09/16/14  Vit D  25 hydroxy (rtn osteoporosis monitoring)  Result Value Ref Range   VITD 20.04 (L) 30.00 - 100.00 ng/mL  PTH, intact and calcium  Result Value Ref Range   PTH 60 14 - 64 pg/mL   Calcium 10.6 (H) 8.4 - 10.5 mg/dL  Calcium, urine, 24 hour  Result Value Ref Range   Calcium, Ur 22 mg/dL   Calcium, 24 hour urine 286 (H) 100 - 250 mg/day  Creatinine, urine, 24 hour  Result Value Ref Range   Creatinine, Urine 89.4 mg/dL   Creatinine, 24H Ur 1162 700 - 1800 mg/day    Review of Systems Occasional headaches no chest pain no back pain abdominal pain no change from some blood in stool no swollen ankles    Objective:   Physical Exam Alert vitals stable. Blood pressure greater than repeat. Lungs clear. Heart regular rhythm. Ankles without edema.       Assessment & Plan:  Impression 1 hypertension good control maintain same #2 hyperlipidemia status uncertain #3 hypothyroidism with some symptoms status uncertain #4  impaired fasting glucose status uncertain plan diet discussed exercise discussed appropriate blood work. Medications refilled. Patient declines flu shot. Recheck in 6 months. WSL

## 2014-11-15 ENCOUNTER — Other Ambulatory Visit: Payer: Self-pay | Admitting: Family Medicine

## 2014-11-16 LAB — HEPATIC FUNCTION PANEL
ALBUMIN: 4.6 g/dL (ref 3.5–5.5)
ALK PHOS: 83 IU/L (ref 39–117)
ALT: 25 IU/L (ref 0–32)
AST: 21 IU/L (ref 0–40)
BILIRUBIN TOTAL: 0.3 mg/dL (ref 0.0–1.2)
BILIRUBIN, DIRECT: 0.1 mg/dL (ref 0.00–0.40)
TOTAL PROTEIN: 7.4 g/dL (ref 6.0–8.5)

## 2014-11-16 LAB — TSH: TSH: 1.78 u[IU]/mL (ref 0.450–4.500)

## 2014-11-16 LAB — LIPID PANEL
CHOL/HDL RATIO: 3.5 ratio (ref 0.0–4.4)
Cholesterol, Total: 184 mg/dL (ref 100–199)
HDL: 52 mg/dL (ref >39)
LDL Calculated: 112 mg/dL — ABNORMAL HIGH (ref 0–99)
Triglycerides: 100 mg/dL (ref 0–149)
VLDL Cholesterol Cal: 20 mg/dL (ref 5–40)

## 2014-11-16 LAB — GLUCOSE, RANDOM: Glucose: 125 mg/dL — ABNORMAL HIGH (ref 65–99)

## 2014-11-21 ENCOUNTER — Encounter: Payer: Self-pay | Admitting: Family Medicine

## 2014-11-23 ENCOUNTER — Ambulatory Visit: Payer: BLUE CROSS/BLUE SHIELD | Admitting: Internal Medicine

## 2014-11-24 ENCOUNTER — Telehealth: Payer: Self-pay | Admitting: Family Medicine

## 2014-11-24 NOTE — Telephone Encounter (Signed)
Patient called requesting blood work results from 11/15/14.

## 2014-11-24 NOTE — Telephone Encounter (Signed)
Notified patient letter was sent 9/18. Patient verbalized understanding.

## 2014-11-24 NOTE — Telephone Encounter (Signed)
Letter sent 9 18

## 2014-11-25 ENCOUNTER — Telehealth: Payer: Self-pay | Admitting: Internal Medicine

## 2014-11-25 NOTE — Telephone Encounter (Signed)
Pt is aware. Note given

## 2014-11-25 NOTE — Telephone Encounter (Signed)
Pt called today to say that she is scheduled for a procedure with RMR on Monday and needs a note for her work saying that she is having a procedure and when she is able to return to work. She will be by before noon today to pick it up.

## 2014-11-29 ENCOUNTER — Encounter (HOSPITAL_COMMUNITY)
Admission: RE | Disposition: A | Discharge status: Home Health | Payer: Self-pay | Source: Ambulatory Visit | Attending: Internal Medicine

## 2014-11-29 ENCOUNTER — Ambulatory Visit (HOSPITAL_COMMUNITY)
Admission: RE | Admit: 2014-11-29 | Discharge: 2014-11-29 | Disposition: A | Discharge status: Home Health | Payer: BLUE CROSS/BLUE SHIELD | Source: Ambulatory Visit | Attending: Internal Medicine | Admitting: Internal Medicine

## 2014-11-29 ENCOUNTER — Encounter (HOSPITAL_COMMUNITY): Payer: Self-pay | Admitting: *Deleted

## 2014-11-29 DIAGNOSIS — E782 Mixed hyperlipidemia: Secondary | ICD-10-CM | POA: Insufficient documentation

## 2014-11-29 DIAGNOSIS — R1013 Epigastric pain: Secondary | ICD-10-CM | POA: Insufficient documentation

## 2014-11-29 DIAGNOSIS — E039 Hypothyroidism, unspecified: Secondary | ICD-10-CM | POA: Insufficient documentation

## 2014-11-29 DIAGNOSIS — R1012 Left upper quadrant pain: Secondary | ICD-10-CM | POA: Insufficient documentation

## 2014-11-29 DIAGNOSIS — I10 Essential (primary) hypertension: Secondary | ICD-10-CM | POA: Diagnosis not present

## 2014-11-29 DIAGNOSIS — Z79899 Other long term (current) drug therapy: Secondary | ICD-10-CM | POA: Insufficient documentation

## 2014-11-29 DIAGNOSIS — K3189 Other diseases of stomach and duodenum: Secondary | ICD-10-CM | POA: Insufficient documentation

## 2014-11-29 DIAGNOSIS — K219 Gastro-esophageal reflux disease without esophagitis: Secondary | ICD-10-CM

## 2014-11-29 DIAGNOSIS — K449 Diaphragmatic hernia without obstruction or gangrene: Secondary | ICD-10-CM | POA: Diagnosis not present

## 2014-11-29 DIAGNOSIS — F418 Other specified anxiety disorders: Secondary | ICD-10-CM | POA: Insufficient documentation

## 2014-11-29 DIAGNOSIS — K319 Disease of stomach and duodenum, unspecified: Secondary | ICD-10-CM | POA: Insufficient documentation

## 2014-11-29 HISTORY — PX: ESOPHAGOGASTRODUODENOSCOPY: SHX5428

## 2014-11-29 SURGERY — EGD (ESOPHAGOGASTRODUODENOSCOPY)
Anesthesia: Moderate Sedation

## 2014-11-29 MED ORDER — MIDAZOLAM HCL 5 MG/5ML IJ SOLN
INTRAMUSCULAR | Status: DC | PRN
  Administered 2014-11-29 (×2): 2 mg via INTRAVENOUS
  Administered 2014-11-29 (×2): 1 mg via INTRAVENOUS

## 2014-11-29 MED ORDER — LIDOCAINE VISCOUS 2 % MT SOLN
OROMUCOSAL | Status: DC | PRN
  Administered 2014-11-29: 3 mL via OROMUCOSAL

## 2014-11-29 MED ORDER — PROMETHAZINE HCL 25 MG/ML IJ SOLN
12.5000 mg | Freq: Once | INTRAMUSCULAR | Status: AC
  Administered 2014-11-29: 12.5 mg via INTRAVENOUS

## 2014-11-29 MED ORDER — MEPERIDINE HCL 100 MG/ML IJ SOLN
INTRAMUSCULAR | Status: DC | PRN
  Administered 2014-11-29: 25 mg via INTRAVENOUS
  Administered 2014-11-29: 50 mg via INTRAVENOUS
  Administered 2014-11-29: 25 mg via INTRAVENOUS

## 2014-11-29 MED ORDER — SODIUM CHLORIDE 0.9 % IV SOLN
INTRAVENOUS | Status: DC
  Administered 2014-11-29: 1000 mL via INTRAVENOUS

## 2014-11-29 MED ORDER — MEPERIDINE HCL 100 MG/ML IJ SOLN
INTRAMUSCULAR | Status: AC
  Filled 2014-11-29: qty 2

## 2014-11-29 MED ORDER — SODIUM CHLORIDE 0.9 % IJ SOLN
INTRAMUSCULAR | Status: AC
  Filled 2014-11-29: qty 3

## 2014-11-29 MED ORDER — SIMETHICONE 40 MG/0.6ML PO SUSP
ORAL | Status: DC | PRN
  Administered 2014-11-29: 08:00:00

## 2014-11-29 MED ORDER — ONDANSETRON HCL 4 MG/2ML IJ SOLN
INTRAMUSCULAR | Status: DC
Start: 2014-11-29 — End: 2014-11-29
  Filled 2014-11-29: qty 2

## 2014-11-29 MED ORDER — LIDOCAINE VISCOUS 2 % MT SOLN
OROMUCOSAL | Status: AC
  Filled 2014-11-29: qty 15

## 2014-11-29 MED ORDER — MIDAZOLAM HCL 5 MG/5ML IJ SOLN
INTRAMUSCULAR | Status: AC
  Filled 2014-11-29: qty 10

## 2014-11-29 MED ORDER — PROMETHAZINE HCL 25 MG/ML IJ SOLN
INTRAMUSCULAR | Status: DC
Start: 2014-11-29 — End: 2014-11-29
  Filled 2014-11-29: qty 1

## 2014-11-29 MED ORDER — ONDANSETRON HCL 4 MG/2ML IJ SOLN
INTRAMUSCULAR | Status: DC | PRN
  Administered 2014-11-29: 4 mg via INTRAVENOUS

## 2014-11-29 NOTE — Op Note (Signed)
[facility] [address] [place], [address]   ENDOSCOPY PROCEDURE REPORT  PATIENT: Kaitlin, [name]  MR#: [id number] BIRTHDATE: 04-Dec-1955 , 58  yrs. old GENDER: female ENDOSCOPIST: R.  Garfield Cornea, MD FACP FACG REFERRED BY:  Rosemary Holms, M.D. PROCEDURE DATE:  Dec 21, 2014 PROCEDURE:  EGD w/ biopsy INDICATIONS:  Long-standing GERD. MEDICATIONS: Versed 6 mg IV and Demerol 100 mg IV in divided doses. Zofran 4 mg IV.  Phenergan 12.5 mg IV.  Xylocaine gel orally. ASA CLASS:      Class II  CONSENT: The risks, benefits, limitations, alternatives and imponderables have been discussed.  The potential for biopsy, esophogeal dilation, etc. have also been reviewed.  Questions have been answered.  All parties agreeable.  Please see the history and physical in the medical record for more information.  DESCRIPTION OF PROCEDURE: After the risks benefits and alternatives of the procedure were thoroughly explained, informed consent was obtained.  The EG-[id number] ([id number]) endoscope was introduced through the mouth and advanced to the second portion of the duodenum , limited by Without limitations. The instrument was slowly withdrawn as the mucosa was fully examined. Estimated blood loss is zero unless otherwise noted in this procedure report.    Normal-appearing, patent tubular esophagus.  Stomach with minimal retained gastric contents (easily washed and suctioned out).  Small hiatal hernia.  Scattered erosions on the greater curvature/antrum. No ulcer or infiltrating process.  Patent pylorus. Normal-appearing first and second portion of the duodenum. Retroflexed views revealed a hiatal hernia.     The scope was then withdrawn from the patient and the procedure completed.  Biopsies of the abnormal gastric mucosa taken.  COMPLICATIONS: There were no immediate complications. EBL 3 mL ENDOSCOPIC IMPRESSION: Gastric erosions. Small hiatal hernia. Status post  biopsy  RECOMMENDATIONS: Follow up on pathology. Begin Protonix 40 mg daily.  REPEAT EXAM:  eSigned:  R. Garfield Cornea, MD Rosalita Chessman [place] Dec 21, 2014 8:12 AM    CC:  CPT CODES: ICD CODES:  The ICD and CPT codes recommended by this software are interpretations from the data that the clinical staff has captured with the software.  The verification of the translation of this report to the ICD and CPT codes and modifiers is the sole responsibility of the health care institution and practicing physician where this report was generated.  [employer] will not be held responsible for the validity of the ICD and CPT codes included on this report.  AMA assumes no liability for data contained or not contained herein. CPT is a Designer, television/film set of the [employer].

## 2014-11-29 NOTE — Discharge Instructions (Addendum)
EGD Discharge instructions Please read the instructions outlined below and refer to this sheet in the next few weeks. These discharge instructions provide you with general information on caring for yourself after you leave the hospital. Your doctor may also give you specific instructions. While your treatment has been planned according to the most current medical practices available, unavoidable complications occasionally occur. If you have any problems or questions after discharge, please call your doctor. ACTIVITY  You may resume your regular activity but move at a slower pace for the next 24 hours.   Take frequent rest periods for the next 24 hours.   Walking will help expel (get rid of) the air and reduce the bloated feeling in your abdomen.   No driving for 24 hours (because of the anesthesia (medicine) used during the test).   You may shower.   Do not sign any important legal documents or operate any machinery for 24 hours (because of the anesthesia used during the test).  NUTRITION  Drink plenty of fluids.   You may resume your normal diet.   Begin with a light meal and progress to your normal diet.   Avoid alcoholic beverages for 24 hours or as instructed by your caregiver.  MEDICATIONS  You may resume your normal medications unless your caregiver tells you otherwise.  WHAT YOU CAN EXPECT TODAY  You may experience abdominal discomfort such as a feeling of fullness or gas pains.  FOLLOW-UP  Your doctor will discuss the results of your test with you.  SEEK IMMEDIATE MEDICAL ATTENTION IF ANY OF THE FOLLOWING OCCUR:  Excessive nausea (feeling sick to your stomach) and/or vomiting.   Severe abdominal pain and distention (swelling).   Trouble swallowing.   Temperature over 101 F (37.8 C).   Rectal bleeding or vomiting of blood.     GERD information provided  Begin Protonix 40 mg daily  Continue Linzess 290 daily  Office visit with [name] in 3 months March 02, 2015 at 1:30PM with Neil Crouch, PA at Dr. Roseanne Kaufman office   Gastroesophageal Reflux Disease, Adult Gastroesophageal reflux disease (GERD) happens when acid from your stomach flows up into the esophagus. When acid comes in contact with the esophagus, the acid causes soreness (inflammation) in the esophagus. Over time, GERD may create small holes (ulcers) in the lining of the esophagus. CAUSES   Increased body weight. This puts pressure on the stomach, making acid rise from the stomach into the esophagus.  Smoking. This increases acid production in the stomach.  Drinking alcohol. This causes decreased pressure in the lower esophageal sphincter (valve or ring of muscle between the esophagus and stomach), allowing acid from the stomach into the esophagus.  Late evening meals and a full stomach. This increases pressure and acid production in the stomach.  A malformed lower esophageal sphincter. Sometimes, no cause is found. SYMPTOMS   Burning pain in the lower part of the mid-chest behind the breastbone and in the mid-stomach area. This may occur twice a week or more often.  Trouble swallowing.  Sore throat.  Dry cough.  Asthma-like symptoms including chest tightness, shortness of breath, or wheezing. DIAGNOSIS  Your caregiver may be able to diagnose GERD based on your symptoms. In some cases, X-rays and other tests may be done to check for complications or to check the condition of your stomach and esophagus. TREATMENT  Your caregiver may recommend over-the-counter or prescription medicines to help decrease acid production. Ask your caregiver before starting or adding any new  medicines.  HOME CARE INSTRUCTIONS   Change the factors that you can control. Ask your caregiver for guidance concerning weight loss, quitting smoking, and alcohol consumption.  Avoid foods and drinks that make your symptoms worse, such as:  Caffeine or alcoholic drinks.  Chocolate.  Peppermint or mint  flavorings.  Garlic and onions.  Spicy foods.  Citrus fruits, such as oranges, lemons, or limes.  Tomato-based foods such as sauce, chili, salsa, and pizza.  Fried and fatty foods.  Avoid lying down for the 3 hours prior to your bedtime or prior to taking a nap.  Eat small, frequent meals instead of large meals.  Wear loose-fitting clothing. Do not wear anything tight around your waist that causes pressure on your stomach.  Raise the head of your bed 6 to 8 inches with wood blocks to help you sleep. Extra pillows will not help.  Only take over-the-counter or prescription medicines for pain, discomfort, or fever as directed by your caregiver.  Do not take aspirin, ibuprofen, or other nonsteroidal anti-inflammatory drugs (NSAIDs). SEEK IMMEDIATE MEDICAL CARE IF:   You have pain in your arms, neck, jaw, teeth, or back.  Your pain increases or changes in intensity or duration.  You develop nausea, vomiting, or sweating (diaphoresis).  You develop shortness of breath, or you faint.  Your vomit is green, yellow, black, or looks like coffee grounds or blood.  Your stool is red, bloody, or black. These symptoms could be signs of other problems, such as heart disease, gastric bleeding, or esophageal bleeding. MAKE SURE YOU:   Understand these instructions.  Will watch your condition.  Will get help right away if you are not doing well or get worse. Document Released: 11/29/2004 Document Revised: 05/14/2011 Document Reviewed: 09/08/2010 [facility] Patient Information 2015 [place], [place]. This information is not intended to replace advice given to you by your health care provider. Make sure you discuss any questions you have with your health care provider.  Further recommendations to follow pending review of pathology report

## 2014-11-29 NOTE — H&P (View-Only) (Signed)
Primary Care Physician:  Mickie Hillier, MD Primary Gastroenterologist:  Dr. Gala Romney  Pre-Procedure History & Physical: HPI:  Kaitlin [name] is a 59 y.o. female here for evaluation of the epigastric pain and insidiously worsening GERD. Patient states she has heartburn "all the timef" ; takes her husband's Protonix frequently these days. No dysphagia or odynophagia. No recent EGD. She is significantly over her ideal body weight. Has some associated epigastric and left upper quadrant abdominal pain. Chronically constipated; tried linzess 145 7 or 8 months ago for a short period of time. Felt it worked but doesn't recall any specific details. Do not feel that MiraLax worked. History of advanced adenomas removed previously;  Good in 2015; due for surveillance colonoscopy 2020.  Past Medical History  Diagnosis Date  . BMI 30.0-30.9,adult 2008 182 LBS    2009 194 LBS  . Helicobacter pylori gastritis AUG 2008 PREVPAC: nausea-->HELIDAC: GI UPSET    HB 14.1 NL HFP H. PYLORI STOOL Ag NEG  . Colon polyp APR 2008  . Anxiety   . Essential hypertension, benign   . Hypothyroidism   . Mixed hyperlipidemia   . Irritable bowel syndrome   . Tubulovillous adenoma 08/07/10    Colonoscopy w/ Dr Oneida Alar w/ 7 polyps removed-bx showed tubular adenomas.  Largest 1cm-hepatic flexure polyp->TA.    Marland Kitchen Panic attacks   . Reflux   . Kidney stones   . Depression   . Folliculitis 5/78/4696  . Mixed stress and urge urinary incontinence 09/26/2012  . Polyp of colon, hyperplastic     Past Surgical History  Procedure Laterality Date  . Colonoscopy  APR 2008    [facility] SIMPLE ADENOMA  . Upper gastrointestinal endoscopy  AUG 2008 PAIN/NAUSEA    H. PYLORI treated  . Cholecystectomy    . Mass excision  02/28/2011    Procedure: EXCISION MASS;  Surgeon: Donato Heinz, MD;  Location: [facility];  Service: General;  Laterality: Right;  soft tissue mass  . Colonoscopy  08/07/2010    Dr. Raynald Kemp adenomas and tubulovillous  adenoma; needs surveillance 2015  . Colonoscopy N/A 09/18/2013    Dr.Georgina Krist- normal rectum, 2 diminutive polys in the mid sigmoid segment o/w the remainder of the colonic mucosa appeared normal.hyperplastic poylps    Prior to Admission medications   Medication Sig Start Date End Date Taking? Authorizing Provider  ALPRAZolam Duanne Moron) 1 MG tablet Take 1 mg by mouth 3 (three) times daily.   Yes Historical Provider, MD  enalapril (VASOTEC) 20 MG tablet Take 1 tablet (20 mg total) by mouth at bedtime. 05/11/14  Yes [name], MD  furosemide (LASIX) 20 MG tablet Take 10 mg by mouth daily.   Yes Historical Provider, MD  levothyroxine (SYNTHROID, LEVOTHROID) 50 MCG tablet TAKE 1 TABLET EVERY DAY 11/01/14  Yes [name], MD  lovastatin (MEVACOR) 40 MG tablet TAKE 1 TABLET (40 MG TOTAL) BY MOUTH AT BEDTIME. 11/01/14  Yes [name], MD  meclizine (ANTIVERT) 25 MG tablet TAKE 1 TABLET 3 TIMES A DAY AS NEEDED DIZZINESS 06/07/14  Yes [name], MD  verapamil (CALAN-SR) 240 MG CR tablet Take 1 tablet (240 mg total) by mouth daily. 05/11/14  Yes [name], MD  Vitamin D, Ergocalciferol, (DRISDOL) 50000 UNITS CAPS capsule Take 1 capsule (50,000 Units total) by mouth every 7 (seven) days. 09/21/14  Yes Renato Shin, MD  etodolac (LODINE) 400 MG tablet Take 1 tablet (400 mg total) by mouth 2 (two) times daily. Take  with food Patient not taking: Reported on 11/02/2014 05/11/14   [name], MD  fesoterodine (TOVIAZ) 8 MG TB24 tablet Take 1 tablet (8 mg total) by mouth daily. Patient not taking: Reported on 11/02/2014 08/27/14   Florian Buff, MD  mirabegron ER (MYRBETRIQ) 50 MG TB24 tablet Take 1 tablet (50 mg total) by mouth daily. Patient not taking: Reported on 11/02/2014 07/06/14   Florian Buff, MD  solifenacin (VESICARE) 10 MG tablet Take 1 tablet (10 mg total) by mouth daily. Patient not taking: Reported on 11/02/2014 08/24/14   Florian Buff, MD    Allergies as of 11/02/2014 - Review  Complete 11/02/2014  Allergen Reaction Noted  . Asa [aspirin] Other (See Comments) 06/09/2012  . Augmentin [amoxicillin-pot clavulanate] Diarrhea 06/09/2012  . Levaquin [levofloxacin in d5w] Other (See Comments) 07/14/2012  . Rocephin [ceftriaxone sodium in dextrose] Hives and Other (See Comments) 06/09/2012  . Valtrex [valacyclovir hcl] Hives and Other (See Comments) 06/09/2012  . Zoloft [sertraline hcl] Other (See Comments) 06/09/2012    Family History  Problem Relation Age of Onset  . Colon cancer Neg Hx   . Anesthesia problems Neg Hx   . Hypotension Neg Hx   . Malignant hyperthermia Neg Hx   . Pseudochol deficiency Neg Hx   . Coronary artery disease Mother   . Hypertension Mother   . Heart attack Mother   . Heart disease Mother   . Coronary artery disease Brother     Age 11  . Heart attack Brother   . Heart disease Brother   . Diabetes Sister   . Diabetes Sister     Social History   Social History  . Marital Status: Married    Spouse Name: N/A  . Number of Children: N/A  . Years of Education: N/A   Occupational History  . Not on file.   Social History Main Topics  . Smoking status: Never Smoker   . Smokeless tobacco: Never Used     Comment: Never Smoked  . Alcohol Use: No  . Drug Use: No  . Sexual Activity: No   Other Topics Concern  . Not on file   Social History Narrative    Review of Systems: See HPI, otherwise negative ROS  Physical Exam: BP 142/80 mmHg  Pulse 68  Temp(Src) 97.4 F (36.3 C)  Ht 5\' 5"  (1.651 m)  Wt 207 lb 3.2 oz (93.985 kg)  BMI 34.48 kg/m2 General:   Alert,  Well-developed, well-nourished, pleasant and cooperative in NAD Skin:  Intact without significant lesions or rashes. Neck:  Supple; no masses or thyromegaly. No significant cervical adenopathy. Lungs:  Clear throughout to auscultation.   No wheezes, crackles, or rhonchi. No acute distress. Heart:  Regular rate and rhythm; no murmurs, clicks, rubs,  or gallops. Abdomen:     obese. Positive bowel sounds.  Soft with minimal epigastric left upper quadrant tenderness to palpation. No appreciable mass or organomegaly. Pulses:  Normal pulses noted. Extremities:  Without clubbing or edema.  Impression:  Pleasant 59 year old lady with prominent, nearly daily, reflux symptoms responsive to PPI therapy. Given the tenacity of her symptoms and her age, she ought to have a diagnostic EGD to take a look at her esophagus. Moreover, nonspecific epigastric/ left upper quadrant abdominal pain may be related to acid peptic disease or other process. Alternatively, it could be due to inadequately treated constipation.  Recommendations:   Schedule diagnostic EGD - chronic GERD / abdominal.  Phenergan 12.5 mg IV in  short stay prior to EGD to augment conscious sedation.The risks, benefits, limitations, alternatives and imponderables have been reviewed with the patient. Potential for esophageal dilation, biopsy, etc. have also been reviewed.  Questions have been answered. All parties agreeable.  Trial of Linzess 290 - one capsule daily for constipation  Constipation and GERD information provided today.    Notice: This dictation was prepared with Dragon dictation along with smaller phrase technology. Any transcriptional errors that result from this process are unintentional and may not be corrected upon review.

## 2014-11-29 NOTE — Interval H&P Note (Signed)
History and Physical Interval Note:  11/29/2014 7:36 AM  Kaitlin [name]  has presented today for surgery, with the diagnosis of GERD  The various methods of treatment have been discussed with the patient and family. After consideration of risks, benefits and other options for treatment, the patient has consented to  Procedure(s) with comments: ESOPHAGOGASTRODUODENOSCOPY (EGD) (N/A) - 0730 as a surgical intervention .  The patient's history has been reviewed, patient examined, no change in status, stable for surgery.  I have reviewed the patient's chart and labs.  Questions were answered to the patient's satisfaction.     [name]  Abdominal pain constipation much better on higher dose of Linzesss;  EGD today to further evaluate GERD.  The risks, benefits, limitations, alternatives and imponderables have been reviewed with the patient. Potential for esophageal dilation, biopsy, etc. have also been reviewed.  Questions have been answered. All parties agreeable.

## 2014-12-02 ENCOUNTER — Encounter (HOSPITAL_COMMUNITY): Payer: Self-pay | Admitting: Internal Medicine

## 2014-12-03 ENCOUNTER — Encounter: Payer: Self-pay | Admitting: Internal Medicine

## 2014-12-18 ENCOUNTER — Encounter (HOSPITAL_COMMUNITY): Payer: Self-pay | Admitting: Emergency Medicine

## 2014-12-18 ENCOUNTER — Emergency Department (HOSPITAL_COMMUNITY)
Admission: EM | Admit: 2014-12-18 | Discharge: 2014-12-18 | Disposition: A | Discharge status: Home / Self Care | Payer: BLUE CROSS/BLUE SHIELD | Attending: Emergency Medicine | Admitting: Emergency Medicine

## 2014-12-18 DIAGNOSIS — I10 Essential (primary) hypertension: Secondary | ICD-10-CM | POA: Insufficient documentation

## 2014-12-18 DIAGNOSIS — Z87442 Personal history of urinary calculi: Secondary | ICD-10-CM | POA: Insufficient documentation

## 2014-12-18 DIAGNOSIS — E782 Mixed hyperlipidemia: Secondary | ICD-10-CM | POA: Diagnosis not present

## 2014-12-18 DIAGNOSIS — Z86010 Personal history of colonic polyps: Secondary | ICD-10-CM | POA: Diagnosis not present

## 2014-12-18 DIAGNOSIS — Z9049 Acquired absence of other specified parts of digestive tract: Secondary | ICD-10-CM | POA: Insufficient documentation

## 2014-12-18 DIAGNOSIS — F410 Panic disorder [episodic paroxysmal anxiety] without agoraphobia: Secondary | ICD-10-CM | POA: Insufficient documentation

## 2014-12-18 DIAGNOSIS — K219 Gastro-esophageal reflux disease without esophagitis: Secondary | ICD-10-CM | POA: Diagnosis not present

## 2014-12-18 DIAGNOSIS — Z8619 Personal history of other infectious and parasitic diseases: Secondary | ICD-10-CM | POA: Insufficient documentation

## 2014-12-18 DIAGNOSIS — Z79899 Other long term (current) drug therapy: Secondary | ICD-10-CM | POA: Diagnosis not present

## 2014-12-18 DIAGNOSIS — E039 Hypothyroidism, unspecified: Secondary | ICD-10-CM | POA: Insufficient documentation

## 2014-12-18 DIAGNOSIS — Z872 Personal history of diseases of the skin and subcutaneous tissue: Secondary | ICD-10-CM | POA: Diagnosis not present

## 2014-12-18 DIAGNOSIS — Z86018 Personal history of other benign neoplasm: Secondary | ICD-10-CM | POA: Insufficient documentation

## 2014-12-18 DIAGNOSIS — Z87448 Personal history of other diseases of urinary system: Secondary | ICD-10-CM | POA: Insufficient documentation

## 2014-12-18 DIAGNOSIS — R1084 Generalized abdominal pain: Secondary | ICD-10-CM | POA: Insufficient documentation

## 2014-12-18 DIAGNOSIS — F329 Major depressive disorder, single episode, unspecified: Secondary | ICD-10-CM | POA: Diagnosis not present

## 2014-12-18 LAB — URINALYSIS, ROUTINE W REFLEX MICROSCOPIC
BILIRUBIN URINE: NEGATIVE
Glucose, UA: NEGATIVE mg/dL
HGB URINE DIPSTICK: NEGATIVE
Ketones, ur: NEGATIVE mg/dL
Leukocytes, UA: NEGATIVE
NITRITE: NEGATIVE
PROTEIN: NEGATIVE mg/dL
Specific Gravity, Urine: >1.03 — ABNORMAL HIGH (ref 1.005–1.030)
UROBILINOGEN UA: 0.2 mg/dL (ref 0.0–1.0)
pH: 6 (ref 5.0–8.0)

## 2014-12-18 LAB — COMPREHENSIVE METABOLIC PANEL
ALBUMIN: 4.1 g/dL (ref 3.5–5.0)
ALT: 29 U/L (ref 14–54)
ANION GAP: 6 (ref 5–15)
AST: 29 U/L (ref 15–41)
Alkaline Phosphatase: 76 U/L (ref 38–126)
BUN: 13 mg/dL (ref 6–20)
CHLORIDE: 109 mmol/L (ref 101–111)
CO2: 25 mmol/L (ref 22–32)
Calcium: 10.3 mg/dL (ref 8.9–10.3)
Creatinine, Ser: 0.8 mg/dL (ref 0.44–1.00)
GFR calc non Af Amer: >60 mL/min (ref >60)
GLUCOSE: 116 mg/dL — AB (ref 65–99)
Potassium: 4 mmol/L (ref 3.5–5.1)
SODIUM: 140 mmol/L (ref 135–145)
Total Bilirubin: 0.4 mg/dL (ref 0.3–1.2)
Total Protein: 7.3 g/dL (ref 6.5–8.1)

## 2014-12-18 LAB — CBC WITH DIFFERENTIAL/PLATELET
Basophils Absolute: 0 10*3/uL (ref 0.0–0.1)
Basophils Relative: 1 %
Eosinophils Absolute: 0.2 10*3/uL (ref 0.0–0.7)
Eosinophils Relative: 2 %
HEMATOCRIT: 39.5 % (ref 36.0–46.0)
HEMOGLOBIN: 13.1 g/dL (ref 12.0–15.0)
LYMPHS ABS: 2.9 10*3/uL (ref 0.7–4.0)
Lymphocytes Relative: 34 %
MCH: 28.4 pg (ref 26.0–34.0)
MCHC: 33.2 g/dL (ref 30.0–36.0)
MCV: 85.5 fL (ref 78.0–100.0)
MONOS PCT: 8 %
Monocytes Absolute: 0.6 10*3/uL (ref 0.1–1.0)
NEUTROS ABS: 4.8 10*3/uL (ref 1.7–7.7)
NEUTROS PCT: 55 %
Platelets: 278 10*3/uL (ref 150–400)
RBC: 4.62 MIL/uL (ref 3.87–5.11)
RDW: 13 % (ref 11.5–15.5)
WBC: 8.5 10*3/uL (ref 4.0–10.5)

## 2014-12-18 LAB — LIPASE, BLOOD: Lipase: 30 U/L (ref 22–51)

## 2014-12-18 MED ORDER — GI COCKTAIL ~~LOC~~
30.0000 mL | Freq: Once | ORAL | Status: DC
  Filled 2014-12-18: qty 30

## 2014-12-18 MED ORDER — ONDANSETRON 8 MG PO TBDP
8.0000 mg | ORAL_TABLET | Freq: Once | ORAL | Status: DC
  Filled 2014-12-18: qty 1

## 2014-12-18 MED ORDER — SUCRALFATE 1 GM/10ML PO SUSP: 1.0000 g | Freq: Three times a day (TID) | ORAL | Status: DC

## 2014-12-18 NOTE — ED Notes (Signed)
Pt report pain in mid abd since endoscopy last month.  Pt states she "Just doesn't feel good."  Pt denies n/v/d.

## 2014-12-18 NOTE — ED Notes (Signed)
Discharge papers given to pt - pt dis-satisfied with service, refused meds and refused prescription - Stated that she wouldn't take anything if the doctor didn't know what he was treating. Stated that she would fu with her GI md on Monday -- Encouraged pt to see if her GI Md on call and to speak to him today if able. Verbalized understanding. Ambulated off unit - ER Md informed

## 2014-12-18 NOTE — Discharge Instructions (Signed)
As discussed, your evaluation today has been largely reassuring.  But, it is important that you monitor your condition carefully, and do not hesitate to return to the ED if you develop new, or concerning changes in your condition. ? ?Otherwise, please follow-up with your physician for appropriate ongoing care. ? ?

## 2014-12-18 NOTE — ED Notes (Signed)
Discharge instructions given to pt. Verbalized understanding to fu with her primary MD on Monday .

## 2014-12-18 NOTE — ED Provider Notes (Addendum)
CSN: [id number]     Arrival date & time 12/18/14  1607 History   First MD Initiated Contact with Patient 12/18/14 1631     Chief Complaint  Patient presents with  . Abdominal Pain     (Consider location/radiation/quality/duration/timing/severity/associated sxs/prior Treatment) HPI Patient presents with concern of ongoing abdominal pain. Patient presents for almost 1 month, since endoscopy She notes that during that endoscopy she had a biopsy performed. She was told that she had areas of gastritis. Pain is diffuse, though focally the upper, left upper abdomen. Minimal relief with Protonix. No other new complaints, including no fever, chills, chest pain, dyspnea.  Past Medical History  Diagnosis Date  . BMI 30.0-30.9,adult 2008 182 LBS    2009 194 LBS  . Helicobacter pylori gastritis AUG 2008 PREVPAC: nausea-->HELIDAC: GI UPSET    HB 14.1 NL HFP H. PYLORI STOOL Ag NEG  . Colon polyp APR 2008  . Anxiety   . Essential hypertension, benign   . Hypothyroidism   . Mixed hyperlipidemia   . Irritable bowel syndrome   . Tubulovillous adenoma 08/07/10    Colonoscopy w/ Dr Oneida Alar w/ 7 polyps removed-bx showed tubular adenomas.  Largest 1cm-hepatic flexure polyp->TA.    Marland Kitchen Panic attacks   . Reflux   . Kidney stones   . Depression   . Folliculitis 8/67/6195  . Mixed stress and urge urinary incontinence 09/26/2012  . Polyp of colon, hyperplastic    Past Surgical History  Procedure Laterality Date  . Colonoscopy  APR 2008    AC/[place] SIMPLE ADENOMA  . Upper gastrointestinal endoscopy  AUG 2008 PAIN/NAUSEA    H. PYLORI treated  . Cholecystectomy    . Mass excision  02/28/2011    Procedure: EXCISION MASS;  Surgeon: Donato Heinz, MD;  Location: [facility];  Service: General;  Laterality: Right;  soft tissue mass  . Colonoscopy  08/07/2010    Dr. Raynald Kemp adenomas and tubulovillous adenoma; needs surveillance 2015  . Colonoscopy N/A 09/18/2013    Dr.Rourk- normal rectum, 2 diminutive  polys in the mid sigmoid segment o/w the remainder of the colonic mucosa appeared normal.hyperplastic poylps  . Esophagogastroduodenoscopy N/A 11/29/2014    Procedure: ESOPHAGOGASTRODUODENOSCOPY (EGD);  Surgeon: Daneil Dolin, MD;  Location: [facility];  Service: Endoscopy;  Laterality: N/A;  0730   Family History  Problem Relation Age of Onset  . Colon cancer Neg Hx   . Anesthesia problems Neg Hx   . Hypotension Neg Hx   . Malignant hyperthermia Neg Hx   . Pseudochol deficiency Neg Hx   . Coronary artery disease Mother   . Hypertension Mother   . Heart attack Mother   . Heart disease Mother   . Coronary artery disease Brother     Age 67  . Heart attack Brother   . Heart disease Brother   . Diabetes Sister   . Diabetes Sister    Social History  Substance Use Topics  . Smoking status: Never Smoker   . Smokeless tobacco: Never Used     Comment: Never Smoked  . Alcohol Use: No   OB History    Gravida Para Term Preterm AB TAB SAB Ectopic Multiple Living   1 1             Review of Systems  Constitutional:       Per HPI, otherwise negative  HENT:       Per HPI, otherwise negative  Respiratory:       Per  HPI, otherwise negative  Cardiovascular:       Per HPI, otherwise negative  Gastrointestinal: Negative for nausea and vomiting.  Endocrine:       Negative aside from HPI  Genitourinary:       Neg aside from HPI   Musculoskeletal:       Per HPI, otherwise negative  Skin: Negative.   Neurological: Negative for syncope.      Allergies  Asa; Augmentin; Levaquin; Rocephin; Valtrex; and Zoloft  Home Medications   Prior to Admission medications   Medication Sig Start Date End Date Taking? Authorizing Provider  ALPRAZolam Duanne Moron) 1 MG tablet Take 1 mg by mouth 3 (three) times daily.   Yes Historical Provider, MD  enalapril (VASOTEC) 20 MG tablet Take 1 tablet (20 mg total) by mouth at bedtime. 11/12/14  Yes [name], MD  furosemide (LASIX) 20 MG tablet Take  10 mg by mouth daily.   Yes Historical Provider, MD  levothyroxine (SYNTHROID, LEVOTHROID) 50 MCG tablet Take 1 tablet (50 mcg total) by mouth daily. 11/12/14  Yes [name], MD  Linaclotide Rolan Lipa) 290 MCG CAPS capsule Take 290 mcg by mouth daily as needed (for constipation).    Yes Historical Provider, MD  lovastatin (MEVACOR) 40 MG tablet TAKE 1 TABLET (40 MG TOTAL) BY MOUTH AT BEDTIME. 11/12/14  Yes [name], MD  pantoprazole (PROTONIX) 40 MG tablet Take 40 mg by mouth daily. 11/29/14  Yes Historical Provider, MD  verapamil (CALAN-SR) 240 MG CR tablet TAKE 1 TABLET (240 MG TOTAL) BY MOUTH DAILY. 11/15/14  Yes [name], MD  Vitamin D, Ergocalciferol, (DRISDOL) 50000 UNITS CAPS capsule Take 1 capsule (50,000 Units total) by mouth every 7 (seven) days. 09/21/14  Yes Renato Shin, MD  sucralfate (CARAFATE) 1 GM/10ML suspension Take 10 mLs (1 g total) by mouth 4 (four) times daily -  with meals and at bedtime. 12/18/14 12/25/14  Carmin Muskrat, MD   BP 160/77 mmHg  Pulse 82  Temp(Src) 98 F (36.7 C) (Oral)  Resp 14  Ht 5\' 5"  (1.651 m)  Wt 206 lb (93.441 kg)  BMI 34.28 kg/m2  SpO2 99% Physical Exam  Constitutional: She is oriented to person, place, and time. She appears well-developed and well-nourished. No distress.  HENT:  Head: Normocephalic and atraumatic.  Eyes: Conjunctivae and EOM are normal.  Cardiovascular: Normal rate and regular rhythm.   Pulmonary/Chest: Effort normal and breath sounds normal. No stridor. No respiratory distress.  Abdominal: She exhibits no distension.  Minimal discomfort throughout the abdomen, with most severe pain in the epigastrium.  Musculoskeletal: She exhibits no edema.  Neurological: She is alert and oriented to person, place, and time. No cranial nerve deficit.  Skin: Skin is warm and dry.  Psychiatric: She has a normal mood and affect.  Nursing note and vitals reviewed.   ED Course  Procedures (including critical care  time) Labs Review Labs Reviewed  URINALYSIS, ROUTINE W REFLEX MICROSCOPIC (NOT AT [facility]) - Abnormal; Notable for the following:    Specific Gravity, Urine >1.030 (*)    All other components within normal limits  COMPREHENSIVE METABOLIC PANEL - Abnormal; Notable for the following:    Glucose, Bld 116 (*)    All other components within normal limits  CBC WITH DIFFERENTIAL/PLATELET  LIPASE, BLOOD      MDM   Final diagnoses:  Generalized abdominal pain   Patient presents with ongoing abdominal pain. The patient is awake and alert, in no distress, with  soft, non-peritoneal abdomen, though with mild epigastric discomfort. Given the patient's description of recent EGD, biopsy, or suspicion for postprocedural discomfort, with no evidence for perforation, acute pancreatitis, or other acute new pathology. Patient had some relief with new medication, was discharged with similar meds to follow-up with GI.  Carmin Muskrat, MD 12/18/14 1814  Update:  The patient was seemingly unhappy with her ED experience.  She refused medications, expressed dissatisfaction with the tests done in the ED.  Carmin Muskrat, MD 12/18/14 [phone]

## 2014-12-27 ENCOUNTER — Telehealth: Payer: Self-pay | Admitting: Endocrinology

## 2014-12-27 MED ORDER — VITAMIN D (ERGOCALCIFEROL) 1.25 MG (50000 UNIT) PO CAPS: 50000.0000 [IU] | ORAL_CAPSULE | ORAL | Status: DC

## 2014-12-27 NOTE — Telephone Encounter (Signed)
Patient called stating that she would like a refill on her Rx  Rx: Vit D  [facility]   Also, patient states that after she takes the Vit D her 2nd, 3rd and 4th digit on (R) hand dont bend This happens about 2 days after taking the Vit D   Please advise   Thank you

## 2014-12-27 NOTE — Telephone Encounter (Signed)
F/u ov is due

## 2014-12-27 NOTE — Telephone Encounter (Signed)
See note below and please advise. Rx for vitamin D has been sent.  Thanks!

## 2014-12-27 NOTE — Telephone Encounter (Signed)
Unable to reach pt will try again at a later time.  

## 2014-12-28 NOTE — Telephone Encounter (Signed)
I contacted the pt and advised of note below. Patient scheduled for 01/04/2015.

## 2015-01-04 ENCOUNTER — Ambulatory Visit: Payer: BLUE CROSS/BLUE SHIELD | Admitting: Endocrinology

## 2015-01-04 DIAGNOSIS — Z0289 Encounter for other administrative examinations: Secondary | ICD-10-CM

## 2015-01-24 ENCOUNTER — Other Ambulatory Visit: Payer: Self-pay | Admitting: Family Medicine

## 2015-01-24 NOTE — Telephone Encounter (Signed)
Ok six mo worth 

## 2015-02-11 ENCOUNTER — Telehealth: Payer: Self-pay | Admitting: Family Medicine

## 2015-02-11 NOTE — Telephone Encounter (Signed)
appt made for Monday AM

## 2015-02-11 NOTE — Telephone Encounter (Signed)
o v next wk, i cannot comment on np management til i see pt

## 2015-02-11 NOTE — Telephone Encounter (Signed)
Pt states she hit her head at work, went to her wellness center at her work She told them about her vomiting issues over the last few days and they  Issued a EKG on her, told her to see her cardiologist and increased her Verapamil med to 240mg  BID. This was all done by a PA and she felt like  Maybe you needed to handle this for her instead of her job's PA. She thought That you would do the stress test with her at the hospital but I advised we  No longer make calls to the hospital. Please advise patient

## 2015-02-11 NOTE — Telephone Encounter (Signed)
If you can not reach her on the cell call home at [phone]

## 2015-02-14 ENCOUNTER — Ambulatory Visit (INDEPENDENT_AMBULATORY_CARE_PROVIDER_SITE_OTHER): Payer: BLUE CROSS/BLUE SHIELD | Admitting: Family Medicine

## 2015-02-14 ENCOUNTER — Encounter: Payer: Self-pay | Admitting: Family Medicine

## 2015-02-14 VITALS — BP 130/88 | Ht 65.0 in | Wt 209.0 lb

## 2015-02-14 DIAGNOSIS — I10 Essential (primary) hypertension: Secondary | ICD-10-CM | POA: Diagnosis not present

## 2015-02-14 DIAGNOSIS — R079 Chest pain, unspecified: Secondary | ICD-10-CM

## 2015-02-14 NOTE — Progress Notes (Signed)
   Subjective:    Patient ID: Kaitlin [name], female    DOB: 11-06-1955, 59 y.o.   MRN: [id number]  HPI Patient arrives to follow up on blood pressure and to discuss wellness visit at work- they recommend her to see cardiology. Expect    Had head injury and a subsequent cyst, seen at well ness center  Seen at center at [place], had bp elevated, they did ekg and rec a cardiologist visit  Pt had tightness in chest, took a small aspirin, and it felt better. Patient admits she took small amounts of aspirin twice because of worry regarding her heart. Slight tightness with exertion when walking substantially. Notes no vomiting or diarrhea. Also patient extremely worried about her own cardiac status. Went into great length describing someone else had similar symptoms and ended up having a serious problem with his heart  Also having major reflux issuess, with vomiting an sustantial reflux  Walks a lot with work, feels a little tight at toime Review of Systems No headache no current chest pain no shortness breath no abdominal pain no nausea no vomiting ROS otherwise negative    Objective:   Physical Exam  Alert vitals stable. HEENT normal lungs clear heart rare rhythm ankles without edema Blood pressure excellent on repeat 1:30 to over 80     Assessment & Plan:  Impression chest pain atypical nature with extreme more out of part of patient. Also has been advised by a PA that she must see cardiologist therefore we will press on long discussion held 25 minutes spent most in discussion regarding numbers symptomatologies concerns the recent etc.

## 2015-03-02 ENCOUNTER — Encounter: Payer: Self-pay | Admitting: Cardiology

## 2015-03-02 ENCOUNTER — Ambulatory Visit: Payer: BLUE CROSS/BLUE SHIELD | Admitting: Gastroenterology

## 2015-03-02 ENCOUNTER — Ambulatory Visit (INDEPENDENT_AMBULATORY_CARE_PROVIDER_SITE_OTHER): Payer: BLUE CROSS/BLUE SHIELD | Admitting: Cardiology

## 2015-03-02 VITALS — BP 122/70 | HR 93 | Ht 65.0 in | Wt 207.0 lb

## 2015-03-02 DIAGNOSIS — R079 Chest pain, unspecified: Secondary | ICD-10-CM | POA: Diagnosis not present

## 2015-03-02 NOTE — Patient Instructions (Signed)
Medication Instructions:  Your physician recommends that you continue on your current medications as directed. Please refer to the Current Medication list given to you today.   Labwork: NONE  Testing/Procedures: NONE  Follow-Up: Your physician recommends that you schedule a follow-up appointment in: 6-8 WEEKS WITH DR. BRANCH   Any Other Special Instructions Will Be Listed Below (If Applicable).     If you need a refill on your cardiac medications before your next appointment, please call your pharmacy.

## 2015-03-02 NOTE — Progress Notes (Signed)
Patient ID: Kaitlin [name], female   DOB: April 05, 1955, 59 y.o.   MRN: [id number]     Clinical Summary Ms. Kelm is a 59 y.o.female seen today as a new patient for the following medical problems.  1. Chest pain - long history of ches tpain - seen by Dr Domenic Polite in 2012 for chest pain. Echo at that time without significant pathology. - 12/2009 Lexiscan nuclear stress no ishcemia - since that time continues to have intermittent chest pain described as a left sided pain radiating into midchest, 4/10 in severity. Could occur at rest or with exertion. Feel diaphoretic. Better with xanax, would last few minutes.  - more recently she reports a different more severe aching in her chest. This started a few weeks ago, and primarily occurred during 2 different episodes of significant nausea/vomiting/retching. Since her vomiting has resolved, this chest pain is resolving. Denies any SOB or DOE, no exertional symptoms.    2. GERD - followed by Dr Gala Romney   3. Palpitations - one episode of fluttering in chest, brought on by emotional stress. No other episodes.    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   Past Medical History  Diagnosis Date  . BMI 30.0-30.9,adult 2008 182 LBS    2009 194 LBS  . Helicobacter pylori gastritis AUG 2008 PREVPAC: nausea-->HELIDAC: GI UPSET    HB 14.1 NL HFP H. PYLORI STOOL Ag NEG  . Colon polyp APR 2008  . Anxiety   . Essential hypertension, benign   . Hypothyroidism   . Mixed hyperlipidemia   . Irritable bowel syndrome   . Tubulovillous adenoma 08/07/10    Colonoscopy w/ Dr Oneida Alar w/ 7 polyps removed-bx showed tubular adenomas.  Largest 1cm-hepatic flexure polyp->TA.    Marland Kitchen Panic attacks   . Reflux   . Kidney stones   . Depression   . Folliculitis [id number]  . Mixed stress and urge urinary incontinence 09/26/2012  . Polyp of colon, hyperplastic      Allergies  Allergen Reactions  . Asa [Aspirin] Other (See Comments)    Increases anxiety  .  Augmentin [Amoxicillin-Pot Clavulanate] Diarrhea  . Levaquin [Levofloxacin In D5w] Other (See Comments)    chestpain  . Rocephin [Ceftriaxone Sodium In Dextrose] Hives and Other (See Comments)    blisters  . Valtrex [Valacyclovir Hcl] Hives and Other (See Comments)  . Zoloft [Sertraline Hcl] Other (See Comments)    Too strong     Current Outpatient Prescriptions  Medication Sig Dispense Refill  . ALPRAZolam (XANAX) 1 MG tablet TAKE 1 TABLET 3 TIMES A DAY 90 tablet 5  . enalapril (VASOTEC) 20 MG tablet Take 1 tablet (20 mg total) by mouth at bedtime. 30 tablet 5  . furosemide (LASIX) 20 MG tablet Take 10 mg by mouth daily.    Marland Kitchen levothyroxine (SYNTHROID, LEVOTHROID) 50 MCG tablet Take 1 tablet (50 mcg total) by mouth daily. 30 tablet 5  . Linaclotide (LINZESS) 290 MCG CAPS capsule Take 290 mcg by mouth daily as needed (for constipation).     Marland Kitchen lovastatin (MEVACOR) 40 MG tablet TAKE 1 TABLET (40 MG TOTAL) BY MOUTH AT BEDTIME. 30 tablet 5  . pantoprazole (PROTONIX) 40 MG tablet Take 40 mg by mouth daily.  5  . sucralfate (CARAFATE) 1 GM/10ML suspension Take 10 mLs (1 g total) by mouth 4 (four) times daily -  with meals and at bedtime. 420 mL 0  . verapamil (CALAN-SR) 240 MG CR tablet TAKE 1 TABLET (240 MG TOTAL)  BY MOUTH DAILY. 30 tablet 5  . Vitamin D, Ergocalciferol, (DRISDOL) 50000 UNITS CAPS capsule Take 1 capsule (50,000 Units total) by mouth every 7 (seven) days. 10 capsule 0   No current facility-administered medications for this visit.     Past Surgical History  Procedure Laterality Date  . Colonoscopy  APR 2008    AC/[place] SIMPLE ADENOMA  . Upper gastrointestinal endoscopy  AUG 2008 PAIN/NAUSEA    H. PYLORI treated  . Cholecystectomy    . Mass excision  02/28/2011    Procedure: EXCISION MASS;  Surgeon: Donato Heinz, MD;  Location: [facility];  Service: General;  Laterality: Right;  soft tissue mass  . Colonoscopy  08/07/2010    Dr. Raynald Kemp adenomas and tubulovillous  adenoma; needs surveillance 2015  . Colonoscopy N/A 09/18/2013    Dr.Rourk- normal rectum, 2 diminutive polys in the mid sigmoid segment o/w the remainder of the colonic mucosa appeared normal.hyperplastic poylps  . Esophagogastroduodenoscopy N/A 11/29/2014    RMR: Gastric erosions. Small hiatal hernia. Status post biopsy.      Allergies  Allergen Reactions  . Asa [Aspirin] Other (See Comments)    Increases anxiety  . Augmentin [Amoxicillin-Pot Clavulanate] Diarrhea  . Levaquin [Levofloxacin In D5w] Other (See Comments)    chestpain  . Rocephin [Ceftriaxone Sodium In Dextrose] Hives and Other (See Comments)    blisters  . Valtrex [Valacyclovir Hcl] Hives and Other (See Comments)  . Zoloft [Sertraline Hcl] Other (See Comments)    Too strong      Family History  Problem Relation Age of Onset  . Colon cancer Neg Hx   . Anesthesia problems Neg Hx   . Hypotension Neg Hx   . Malignant hyperthermia Neg Hx   . Pseudochol deficiency Neg Hx   . Coronary artery disease Mother   . Hypertension Mother   . Heart attack Mother   . Heart disease Mother   . Coronary artery disease Brother     Age 10  . Heart attack Brother   . Heart disease Brother   . Diabetes Sister   . Diabetes Sister      Social History Ms. Adney reports that she has never smoked. She has never used smokeless tobacco. Ms. Asch reports that she does not drink alcohol.   Review of Systems CONSTITUTIONAL: No weight loss, fever, chills, weakness or fatigue.  HEENT: Eyes: No visual loss, blurred vision, double vision or yellow sclerae.No hearing loss, sneezing, congestion, runny nose or sore throat.  SKIN: No rash or itching.  CARDIOVASCULAR: per HPI RESPIRATORY: No shortness of breath, cough or sputum.  GASTROINTESTINAL: No anorexia, nausea, vomiting or diarrhea. No abdominal pain or blood.  GENITOURINARY: No burning on urination, no polyuria NEUROLOGICAL: No headache, dizziness, syncope, paralysis, ataxia,  numbness or tingling in the extremities. No change in bowel or bladder control.  MUSCULOSKELETAL: No muscle, back pain, joint pain or stiffness.  LYMPHATICS: No enlarged nodes. No history of splenectomy.  PSYCHIATRIC: No history of depression or anxiety.  ENDOCRINOLOGIC: No reports of sweating, cold or heat intolerance. No polyuria or polydipsia.  Marland Kitchen   Physical Examination Filed Vitals:   03/02/15 0843  BP: 122/70  Pulse: 93   Filed Vitals:   03/02/15 0843  Height: 5\' 5"  (1.651 m)  Weight: 207 lb (93.895 kg)    Gen: resting comfortably, no acute distress HEENT: no scleral icterus, pupils equal round and reactive, no palptable cervical adenopathy,  CV: RRR, no m/r/g, no jvd Resp: Clear to auscultation  bilaterally GI: abdomen is soft, non-tender, non-distended, normal bowel sounds, no hepatosplenomegaly MSK: extremities are warm, no edema.  Skin: warm, no rash Neuro:  no focal deficits Psych: appropriate affect   Diagnostic Studies 12/2010 echo Study Conclusions  - Left ventricle: The cavity size was normal. Mild septal hypertrophy. Systolic function was normal. The estimated ejection fraction was in the range of 60% to 65%. Wall motion was normal; there were no regional wall motion abnormalities. - Aortic valve: Mildly calcified annulus. - Mitral valve: Calcified annulus. - Atrial septum: No defect or patent foramen ovale was identified.   03/02/15 EKG (I reviewed independently in clinic today) NSR  Assessment and Plan  1. Chest pain - long history of chronic atypical chest pain with negative stress test in 2011. More recent symptoms somewhat different but primarily associated with episodes where she was vomiting and retching. Since her vomiting has resolved her chest pain is resolving. No exertional symptoms. Symptoms not consistent with angina, do not see indication for repeat stress testing at this time. She has been asked to contact our office if  symptoms change, otherwise f/u in 2 months to reevaluate.       Arnoldo Lenis, M.D.,

## 2015-03-03 ENCOUNTER — Telehealth: Payer: Self-pay | Admitting: Endocrinology

## 2015-03-03 MED ORDER — FUROSEMIDE 20 MG PO TABS: 10.0000 mg | ORAL_TABLET | Freq: Every day | ORAL | Status: DC

## 2015-03-03 NOTE — Telephone Encounter (Signed)
Patient need to know why her Lasix was denied, she only have two pills left, please advise

## 2015-03-15 ENCOUNTER — Ambulatory Visit: Payer: BLUE CROSS/BLUE SHIELD | Admitting: Internal Medicine

## 2015-03-15 ENCOUNTER — Telehealth: Payer: Self-pay | Admitting: Internal Medicine

## 2015-03-15 ENCOUNTER — Encounter: Payer: Self-pay | Admitting: Internal Medicine

## 2015-03-15 NOTE — Telephone Encounter (Signed)
PATIENT WAS A NO SHOW AND LETTER SENT  °

## 2015-04-13 ENCOUNTER — Encounter: Payer: Self-pay | Admitting: Family Medicine

## 2015-04-13 ENCOUNTER — Ambulatory Visit (INDEPENDENT_AMBULATORY_CARE_PROVIDER_SITE_OTHER): Payer: BLUE CROSS/BLUE SHIELD | Admitting: Family Medicine

## 2015-04-13 VITALS — BP 136/82 | Temp 98.7°F | Ht 65.0 in | Wt 207.8 lb

## 2015-04-13 DIAGNOSIS — J329 Chronic sinusitis, unspecified: Secondary | ICD-10-CM

## 2015-04-13 DIAGNOSIS — J310 Chronic rhinitis: Secondary | ICD-10-CM

## 2015-04-13 MED ORDER — CEFPROZIL 500 MG PO TABS: 500.0000 mg | ORAL_TABLET | Freq: Two times a day (BID) | ORAL | Status: DC

## 2015-04-13 NOTE — Progress Notes (Signed)
   Subjective:    Patient ID: Kaitlin [name], female    DOB: 10-03-1955, 60 y.o.   MRN: [id number]  Cough This is a new problem. The current episode started in the past 7 days. Associated symptoms include chills, ear pain, headaches, myalgias and nasal congestion.   Muscles bacj ad neck aching  Sig cough  Cold and chills  Appetite ok, Energy level definitely on the low side expect  Frontal headache with pressure increase with change of position Review of Systems  Constitutional: Positive for chills.  HENT: Positive for ear pain.   Respiratory: Positive for cough.   Musculoskeletal: Positive for myalgias.  Neurological: Positive for headaches.       Objective:   Physical Exam  Alert, mild malaise. Hydration good Vitals stable. frontal/ maxillary tenderness evident positive nasal congestion. pharynx normal neck supple  lungs clear/no crackles or wheezes. heart regular in rhythm       Assessment & Plan:  Impression rhinosinusitis likely post viral, discussed with patient. plan antibiotics prescribed. Questions answered. Symptomatic care discussed. warning signs discussed. WSL

## 2015-04-14 ENCOUNTER — Encounter: Payer: Self-pay | Admitting: Cardiology

## 2015-04-14 ENCOUNTER — Encounter: Payer: BLUE CROSS/BLUE SHIELD | Admitting: Cardiology

## 2015-04-14 NOTE — Progress Notes (Signed)
Patient ID: Kaitlin [name], female   DOB: 12-26-1955, 60 y.o.   MRN: [id number]   Encounter opened in error

## 2015-04-21 ENCOUNTER — Telehealth: Payer: Self-pay | Admitting: Family Medicine

## 2015-04-21 MED ORDER — DOXYCYCLINE HYCLATE 100 MG PO TABS: 100.0000 mg | ORAL_TABLET | Freq: Two times a day (BID) | ORAL | Status: DC

## 2015-04-21 NOTE — Telephone Encounter (Signed)
Patient has reported allergy to rocephin, augmentin and levaquin please advise

## 2015-04-21 NOTE — Telephone Encounter (Signed)
Keflex 500 tid ten d 

## 2015-04-21 NOTE — Telephone Encounter (Signed)
I rec doxy 100 mg twice a day cancel other order take with a snack in a glass of water if ongoing troubles needs office visit

## 2015-04-21 NOTE — Telephone Encounter (Signed)
Rx sent electronically to pharmacy. Patient advised to take med with snack and glass of water and to follow up if ongoing problems. Patient verbalized understanding.

## 2015-04-21 NOTE — Telephone Encounter (Signed)
Was on cefzil

## 2015-04-21 NOTE — Telephone Encounter (Signed)
Pt called stating that she is still not better and has finished her antibiotic. Pt would like something else to be called in if possible.     [facility]

## 2015-04-24 ENCOUNTER — Emergency Department (HOSPITAL_COMMUNITY)
Admission: EM | Admit: 2015-04-24 | Discharge: 2015-04-24 | Disposition: A | Discharge status: Home / Self Care | Payer: BLUE CROSS/BLUE SHIELD | Attending: Emergency Medicine | Admitting: Emergency Medicine

## 2015-04-24 ENCOUNTER — Emergency Department (HOSPITAL_COMMUNITY): Payer: BLUE CROSS/BLUE SHIELD

## 2015-04-24 ENCOUNTER — Encounter (HOSPITAL_COMMUNITY): Payer: Self-pay | Admitting: *Deleted

## 2015-04-24 DIAGNOSIS — Z87442 Personal history of urinary calculi: Secondary | ICD-10-CM | POA: Insufficient documentation

## 2015-04-24 DIAGNOSIS — E785 Hyperlipidemia, unspecified: Secondary | ICD-10-CM | POA: Diagnosis not present

## 2015-04-24 DIAGNOSIS — R05 Cough: Secondary | ICD-10-CM | POA: Diagnosis present

## 2015-04-24 DIAGNOSIS — F329 Major depressive disorder, single episode, unspecified: Secondary | ICD-10-CM | POA: Insufficient documentation

## 2015-04-24 DIAGNOSIS — E039 Hypothyroidism, unspecified: Secondary | ICD-10-CM | POA: Diagnosis not present

## 2015-04-24 DIAGNOSIS — J329 Chronic sinusitis, unspecified: Secondary | ICD-10-CM | POA: Diagnosis not present

## 2015-04-24 DIAGNOSIS — B9789 Other viral agents as the cause of diseases classified elsewhere: Secondary | ICD-10-CM

## 2015-04-24 DIAGNOSIS — J069 Acute upper respiratory infection, unspecified: Secondary | ICD-10-CM | POA: Insufficient documentation

## 2015-04-24 DIAGNOSIS — I10 Essential (primary) hypertension: Secondary | ICD-10-CM | POA: Diagnosis not present

## 2015-04-24 IMAGING — DX DG CHEST 2V
2 series · 2 of 2 positions shown · non-contrast
Comparison: [DATE]

CLINICAL DATA: Diagnosed with flu 2 weeks ago. Cough, chills, body
aches and fever.

EXAM:
CHEST  2 VIEW

[chest pa]
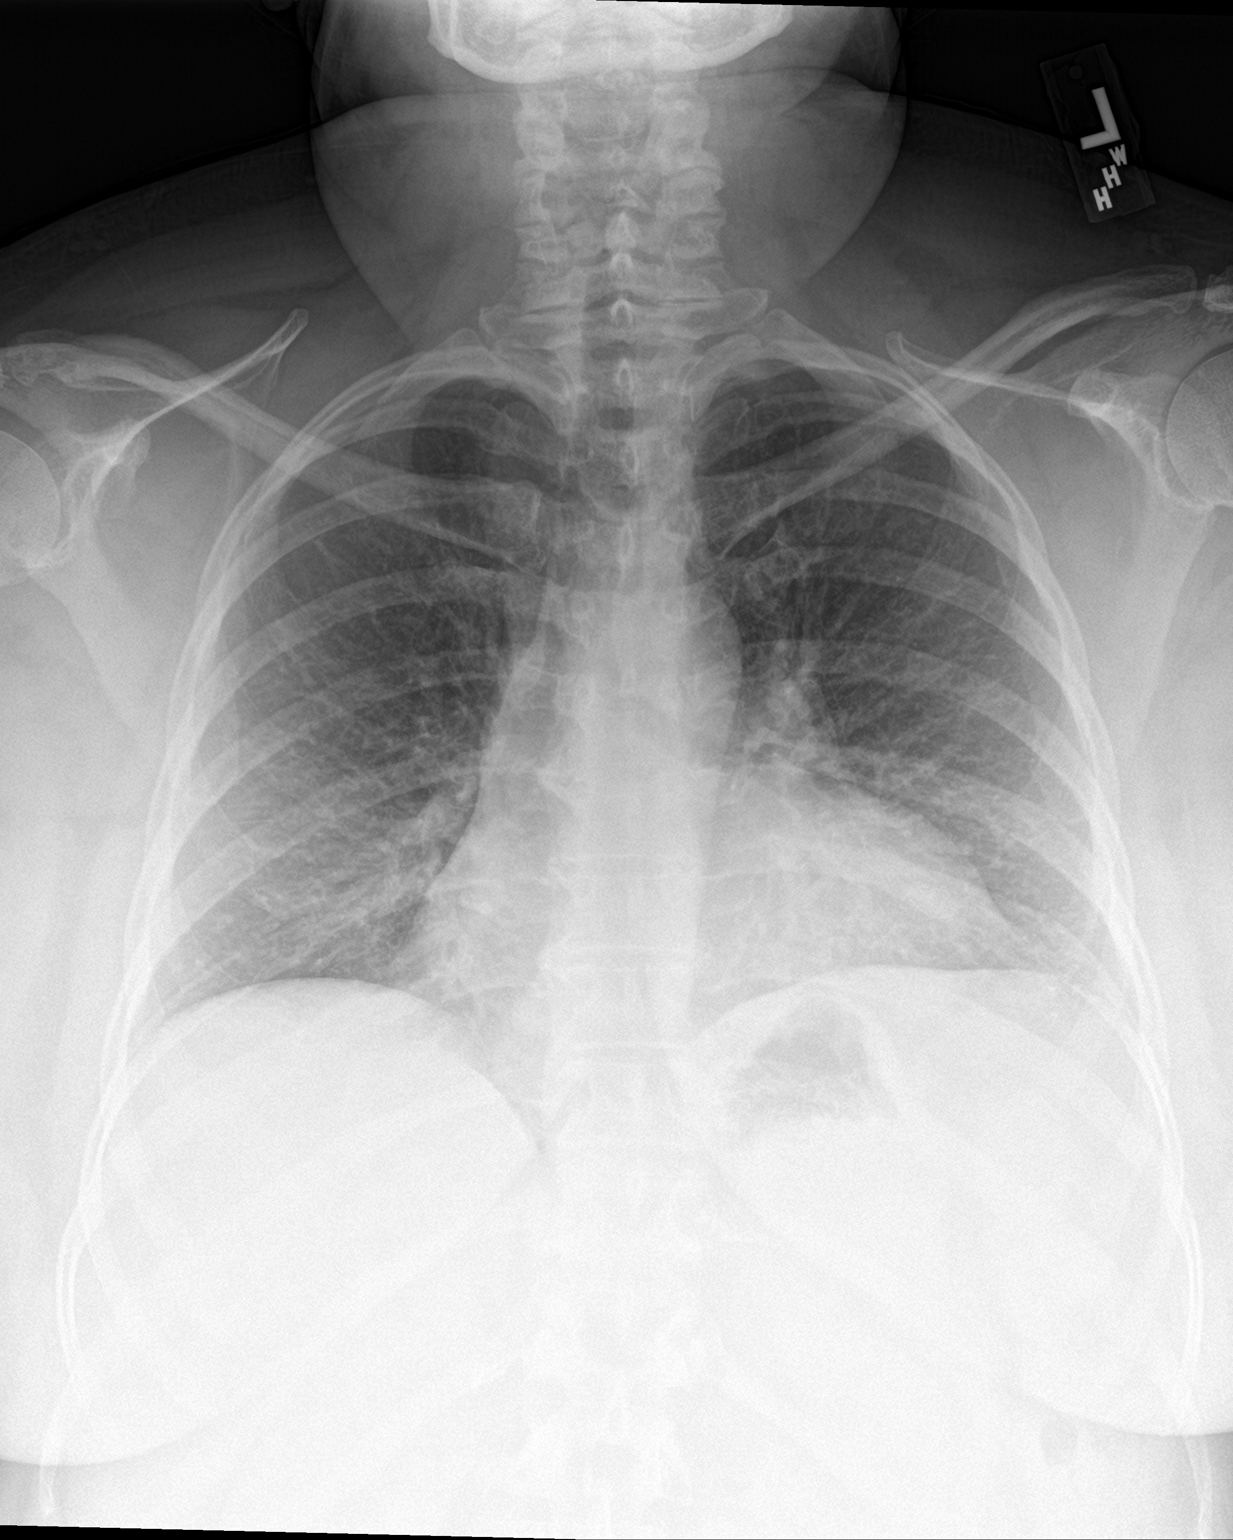

[chest lat]
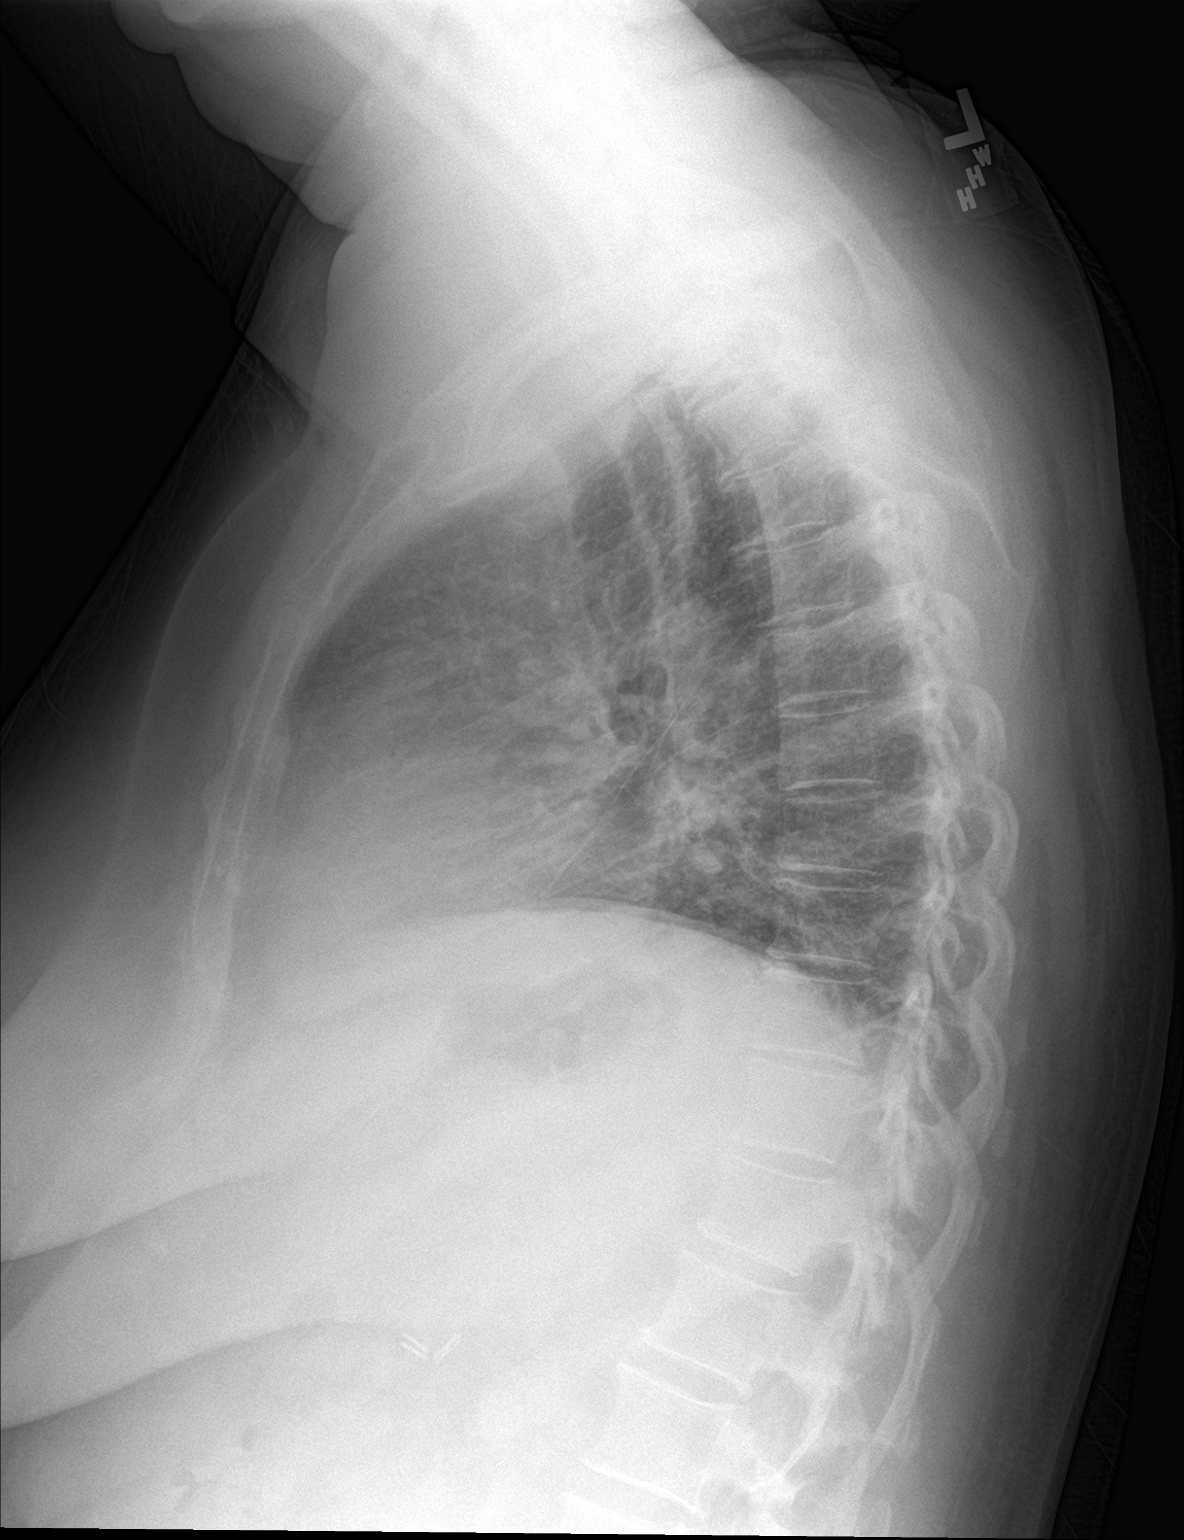

[2 of 2 positions shown; findings below may reference images not displayed]

FINDINGS: The heart size and mediastinal contours are within normal limits.
Both lungs are clear. The visualized skeletal structures are
unremarkable.
IMPRESSION: No active cardiopulmonary disease.

## 2015-04-24 MED ORDER — LORATADINE 10 MG PO TABS
10.0000 mg | ORAL_TABLET | Freq: Every day | ORAL | Status: DC
  Administered 2015-04-24: 10 mg via ORAL
  Filled 2015-04-24: qty 1

## 2015-04-24 MED ORDER — KETOROLAC TROMETHAMINE 30 MG/ML IJ SOLN
60.0000 mg | Freq: Once | INTRAMUSCULAR | Status: AC
  Administered 2015-04-24: 60 mg via INTRAMUSCULAR
  Filled 2015-04-24: qty 2

## 2015-04-24 MED ORDER — OXYMETAZOLINE HCL 0.05 % NA SOLN
3.0000 | Freq: Once | NASAL | Status: AC
  Administered 2015-04-24: 3 via NASAL
  Filled 2015-04-24: qty 15

## 2015-04-24 MED ORDER — DEXAMETHASONE SODIUM PHOSPHATE 10 MG/ML IJ SOLN
10.0000 mg | Freq: Once | INTRAMUSCULAR | Status: AC
  Administered 2015-04-24: 10 mg via INTRAMUSCULAR
  Filled 2015-04-24: qty 1

## 2015-04-24 MED ORDER — LORATADINE 10 MG PO TABS: 10.0000 mg | ORAL_TABLET | Freq: Every day | ORAL | Status: DC

## 2015-04-24 MED ORDER — IBUPROFEN 800 MG PO TABS: 800.0000 mg | ORAL_TABLET | Freq: Three times a day (TID) | ORAL | Status: DC | PRN

## 2015-04-24 MED ORDER — FLUTICASONE PROPIONATE 50 MCG/ACT NA SUSP: 2.0000 | Freq: Every day | NASAL | Status: DC

## 2015-04-24 NOTE — Discharge Instructions (Signed)
Your chest x-ray did not show pneumonia. I recommend that you continue your doxycycline that you have already started.  I do not feel changing you to a different antibiotic after now been on 2 rounds of antibiotics will help your symptoms. Your symptoms are likely either caused by a virus or allergies. There is no sign of life threatening illness on your exam today. Please follow up with your primary care physician if symptoms continue. I recommend using Tylenol 1000 mg every 6 hours as needed for fever and pain and alternating this with ibuprofen 800 mg every 8 hours as needed for fever and pain. You may also use Afrin nasal spray for nasal congestion 2 sprays in each nostril twice a day. Please do not use this medication for more than 3 days in a well as it may make her nasal congestion worse.  You may use Benadryl 50 mg at night to help her congestion and to help you sleep.     Sinusitis, Adult Sinusitis is redness, soreness, and inflammation of the paranasal sinuses. Paranasal sinuses are air pockets within the bones of your face. They are located beneath your eyes, in the middle of your forehead, and above your eyes. In healthy paranasal sinuses, mucus is able to drain out, and air is able to circulate through them by way of your nose. However, when your paranasal sinuses are inflamed, mucus and air can become trapped. This can allow bacteria and other germs to grow and cause infection. Sinusitis can develop quickly and last only a short time (acute) or continue over a long period (chronic). Sinusitis that lasts for more than 12 weeks is considered chronic. CAUSES Causes of sinusitis include:  Allergies.  Structural abnormalities, such as displacement of the cartilage that separates your nostrils (deviated septum), which can decrease the air flow through your nose and sinuses and affect sinus drainage.  Functional abnormalities, such as when the small hairs (cilia) that line your sinuses and help  remove mucus do not work properly or are not present. SIGNS AND SYMPTOMS Symptoms of acute and chronic sinusitis are the same. The primary symptoms are pain and pressure around the affected sinuses. Other symptoms include:  Upper toothache.  Earache.  Headache.  Bad breath.  Decreased sense of smell and taste.  A cough, which worsens when you are lying flat.  Fatigue.  Fever.  Thick drainage from your nose, which often is green and may contain pus (purulent).  Swelling and warmth over the affected sinuses. DIAGNOSIS Your health care provider will perform a physical exam. During your exam, your health care provider may perform any of the following to help determine if you have acute sinusitis or chronic sinusitis:  Look in your nose for signs of abnormal growths in your nostrils (nasal polyps).  Tap over the affected sinus to check for signs of infection.  View the inside of your sinuses using an imaging device that has a light attached (endoscope). If your health care provider suspects that you have chronic sinusitis, one or more of the following tests may be recommended:  Allergy tests.  Nasal culture. A sample of mucus is taken from your nose, sent to a lab, and screened for bacteria.  Nasal cytology. A sample of mucus is taken from your nose and examined by your health care provider to determine if your sinusitis is related to an allergy. TREATMENT Most cases of acute sinusitis are related to a viral infection and will resolve on their own within 10  days. Sometimes, medicines are prescribed to help relieve symptoms of both acute and chronic sinusitis. These may include pain medicines, decongestants, nasal steroid sprays, or saline sprays. However, for sinusitis related to a bacterial infection, your health care provider will prescribe antibiotic medicines. These are medicines that will help kill the bacteria causing the infection. Rarely, sinusitis is caused by a fungal  infection. In these cases, your health care provider will prescribe antifungal medicine. For some cases of chronic sinusitis, surgery is needed. Generally, these are cases in which sinusitis recurs more than 3 times per year, despite other treatments. HOME CARE INSTRUCTIONS  Drink plenty of water. Water helps thin the mucus so your sinuses can drain more easily.  Use a humidifier.  Inhale steam 3-4 times a day (for example, sit in the bathroom with the shower running).  Apply a warm, moist washcloth to your face 3-4 times a day, or as directed by your health care provider.  Use saline nasal sprays to help moisten and clean your sinuses.  Take medicines only as directed by your health care provider.  If you were prescribed either an antibiotic or antifungal medicine, finish it all even if you start to feel better. SEEK IMMEDIATE MEDICAL CARE IF:  You have increasing pain or severe headaches.  You have nausea, vomiting, or drowsiness.  You have swelling around your face.  You have vision problems.  You have a stiff neck.  You have difficulty breathing.   This information is not intended to replace advice given to you by your health care provider. Make sure you discuss any questions you have with your health care provider.   Document Released: 02/19/2005 Document Revised: 03/12/2014 Document Reviewed: 03/06/2011 Elsevier Interactive Patient Education 2016 [employer]  Upper Respiratory Infection, Adult Most upper respiratory infections (URIs) are a viral infection of the air passages leading to the lungs. A URI affects the nose, throat, and upper air passages. The most common type of URI is nasopharyngitis and is typically referred to as "the common cold." URIs run their course and usually go away on their own. Most of the time, a URI does not require medical attention, but sometimes a bacterial infection in the upper airways can follow a viral infection. This is called a  secondary infection. Sinus and middle ear infections are common types of secondary upper respiratory infections. Bacterial pneumonia can also complicate a URI. A URI can worsen asthma and chronic obstructive pulmonary disease (COPD). Sometimes, these complications can require emergency medical care and may be life threatening.  CAUSES Almost all URIs are caused by viruses. A virus is a type of germ and can spread from one person to another.  RISKS FACTORS You may be at risk for a URI if:   You smoke.   You have chronic heart or lung disease.  You have a weakened defense (immune) system.   You are very young or very old.   You have nasal allergies or asthma.  You work in crowded or poorly ventilated areas.  You work in health care facilities or schools. SIGNS AND SYMPTOMS  Symptoms typically develop 2-3 days after you come in contact with a cold virus. Most viral URIs last 7-10 days. However, viral URIs from the influenza virus (flu virus) can last 14-18 days and are typically more severe. Symptoms may include:   Runny or stuffy (congested) nose.   Sneezing.   Cough.   Sore throat.   Headache.   Fatigue.   Fever.  Loss of appetite.   Pain in your forehead, behind your eyes, and over your cheekbones (sinus pain).  Muscle aches.  DIAGNOSIS  Your health care provider may diagnose a URI by:  Physical exam.  Tests to check that your symptoms are not due to another condition such as:  Strep throat.  Sinusitis.  Pneumonia.  Asthma. TREATMENT  A URI goes away on its own with time. It cannot be cured with medicines, but medicines may be prescribed or recommended to relieve symptoms. Medicines may help:  Reduce your fever.  Reduce your cough.  Relieve nasal congestion. HOME CARE INSTRUCTIONS   Take medicines only as directed by your health care provider.   Gargle warm saltwater or take cough drops to comfort your throat as directed by your health  care provider.  Use a warm mist humidifier or inhale steam from a shower to increase air moisture. This may make it easier to breathe.  Drink enough fluid to keep your urine clear or pale yellow.   Eat soups and other clear broths and maintain good nutrition.   Rest as needed.   Return to work when your temperature has returned to normal or as your health care provider advises. You may need to stay home longer to avoid infecting others. You can also use a face mask and careful hand washing to prevent spread of the virus.  Increase the usage of your inhaler if you have asthma.   Do not use any tobacco products, including cigarettes, chewing tobacco, or electronic cigarettes. If you need help quitting, ask your health care provider. PREVENTION  The best way to protect yourself from getting a cold is to practice good hygiene.   Avoid oral or hand contact with people with cold symptoms.   Wash your hands often if contact occurs.  There is no clear evidence that vitamin C, vitamin E, echinacea, or exercise reduces the chance of developing a cold. However, it is always recommended to get plenty of rest, exercise, and practice good nutrition.  SEEK MEDICAL CARE IF:   You are getting worse rather than better.   Your symptoms are not controlled by medicine.   You have chills.  You have worsening shortness of breath.  You have brown or red mucus.  You have yellow or brown nasal discharge.  You have pain in your face, especially when you bend forward.  You have a fever.  You have swollen neck glands.  You have pain while swallowing.  You have white areas in the back of your throat. SEEK IMMEDIATE MEDICAL CARE IF:   You have severe or persistent:  Headache.  Ear pain.  Sinus pain.  Chest pain.  You have chronic lung disease and any of the following:  Wheezing.  Prolonged cough.  Coughing up blood.  A change in your usual mucus.  You have a stiff  neck.  You have changes in your:  Vision.  Hearing.  Thinking.  Mood. MAKE SURE YOU:   Understand these instructions.  Will watch your condition.  Will get help right away if you are not doing well or get worse.   This information is not intended to replace advice given to you by your health care provider. Make sure you discuss any questions you have with your health care provider.   Document Released: 08/15/2000 Document Revised: 07/06/2014 Document Reviewed: 05/27/2013 Elsevier Interactive Patient Education [employer].

## 2015-04-24 NOTE — ED Provider Notes (Signed)
TIME SEEN: 3:40 AM  CHIEF COMPLAINT: running nose, sneezing, watery eyes, dry cough  HPI: Pt is a 60 y.o. female with history of hypertension, hyperlipidemia who presents to the emergency department with 2 weeks of dry cough, running nose, sneezing, sinus pressure. Patient reports symptoms started off with body aches and subjective fevers which have improved. States she is on 2 rounds of antibiotics without relief. She is currently on doxycycline. Was previously on Cefzil.  Her PCP is Luking.  Patient was told she had "mild flu". Denies vomiting or diarrhea. No chest pain or shortness of breath. She has not tried any medication including Tylenol or ibuprofen at home. States that she is coughing up clear sputum.    ROS: See HPI Constitutional: no fever  Eyes: no drainage  ENT: no runny nose   Cardiovascular:  no chest pain  Resp: no SOB  GI: no vomiting GU: no dysuria Integumentary: no rash  Allergy: no hives  Musculoskeletal: no leg swelling  Neurological: no slurred speech ROS otherwise negative  PAST MEDICAL HISTORY/PAST SURGICAL HISTORY:  Past Medical History  Diagnosis Date  . BMI 30.0-30.9,adult 2008 182 LBS    2009 194 LBS  . Helicobacter pylori gastritis AUG 2008 PREVPAC: nausea-->HELIDAC: GI UPSET    HB 14.1 NL HFP H. PYLORI STOOL Ag NEG  . Colon polyp APR 2008  . Anxiety   . Essential hypertension, benign   . Hypothyroidism   . Mixed hyperlipidemia   . Irritable bowel syndrome   . Tubulovillous adenoma 08/07/10    Colonoscopy w/ Dr Oneida Alar w/ 7 polyps removed-bx showed tubular adenomas.  Largest 1cm-hepatic flexure polyp->TA.    Marland Kitchen Panic attacks   . Reflux   . Kidney stones   . Depression   . Folliculitis [id number]  . Mixed stress and urge urinary incontinence 09/26/2012  . Polyp of colon, hyperplastic     MEDICATIONS:  Prior to Admission medications   Medication Sig Start Date End Date Taking? Authorizing Provider  ALPRAZolam Duanne Moron) 1 MG tablet TAKE 1 TABLET 3  TIMES A DAY 01/24/15   [name], MD  doxycycline (VIBRA-TABS) 100 MG tablet Take 1 tablet (100 mg total) by mouth 2 (two) times daily. 04/21/15   [name], MD  enalapril (VASOTEC) 20 MG tablet Take 1 tablet (20 mg total) by mouth at bedtime. 11/12/14   [name], MD  furosemide (LASIX) 20 MG tablet Take 0.5 tablets (10 mg total) by mouth daily. 03/03/15   Renato Shin, MD  levothyroxine (SYNTHROID, LEVOTHROID) 50 MCG tablet Take 1 tablet (50 mcg total) by mouth daily. 11/12/14   [name], MD  lovastatin (MEVACOR) 40 MG tablet TAKE 1 TABLET (40 MG TOTAL) BY MOUTH AT BEDTIME. 11/12/14   [name], MD  pantoprazole (PROTONIX) 40 MG tablet Take 40 mg by mouth daily. 11/29/14   Historical Provider, MD  sucralfate (CARAFATE) 1 GM/10ML suspension Take 10 mLs (1 g total) by mouth 4 (four) times daily -  with meals and at bedtime. 12/18/14 12/25/14  Carmin Muskrat, MD  verapamil (CALAN-SR) 240 MG CR tablet TAKE 1 TABLET (240 MG TOTAL) BY MOUTH DAILY. 11/15/14   [name], MD  Vitamin D, Ergocalciferol, (DRISDOL) 50000 UNITS CAPS capsule Take 1 capsule (50,000 Units total) by mouth every 7 (seven) days. 12/27/14   Renato Shin, MD    ALLERGIES:  Allergies  Allergen Reactions  . Asa [Aspirin] Other (See Comments)    Increases anxiety  . Augmentin [  Amoxicillin-Pot Clavulanate] Diarrhea  . Levaquin [Levofloxacin In D5w] Other (See Comments)    chestpain  . Rocephin [Ceftriaxone Sodium In Dextrose] Hives and Other (See Comments)    blisters  . Valtrex [Valacyclovir Hcl] Hives and Other (See Comments)  . Zoloft [Sertraline Hcl] Other (See Comments)    Too strong    SOCIAL HISTORY:  Social History  Substance Use Topics  . Smoking status: Never Smoker   . Smokeless tobacco: Never Used     Comment: Never Smoked  . Alcohol Use: No    FAMILY HISTORY: Family History  Problem Relation Age of Onset  . Colon cancer Neg Hx   . Anesthesia problems Neg Hx   .  Hypotension Neg Hx   . Malignant hyperthermia Neg Hx   . Pseudochol deficiency Neg Hx   . Coronary artery disease Mother   . Hypertension Mother   . Heart attack Mother   . Heart disease Mother   . Coronary artery disease Brother     Age 62  . Heart attack Brother   . Heart disease Brother   . Diabetes Sister   . Diabetes Sister     EXAM: BP 153/87 mmHg  Pulse 90  Temp(Src) 97.7 F (36.5 C) (Oral)  Resp 20  Ht 5\' 5"  (1.651 m)  Wt 207 lb (93.895 kg)  BMI 34.45 kg/m2  SpO2 95% CONSTITUTIONAL: Alert and oriented and responds appropriately to questions. Well-appearing; well-nourished; afebrile and nontoxic HEAD: Normocephalic EYES: Conjunctivae clear, PERRL ENT: normal nose; no rhinorrhea; moist mucous membranes; pharynx without lesions noted; No tonsillar hypertrophy or exudate, no trismus or drooling, no uvular deviation, no stridor, normal phonation, no Ludwig's angina, tender to palpation over the bilateral maxillary sinuses and frontal sinus without erythema or warmth NECK: Supple, no meningismus, no LAD; no nuchal rigidity  CARD: RRR; S1 and S2 appreciated; no murmurs, no clicks, no rubs, no gallops RESP: Normal chest excursion without splinting or tachypnea; breath sounds clear and equal bilaterally; no wheezes, no rhonchi, no rales, no hypoxia or respiratory distress, speaking full sentences ABD/GI: Normal bowel sounds; non-distended; soft, non-tender, no rebound, no guarding, no peritoneal signs BACK:  The back appears normal and is non-tender to palpation, there is no CVA tenderness EXT: Normal ROM in all joints; non-tender to palpation; no edema; normal capillary refill; no cyanosis, no calf tenderness or swelling    SKIN: Normal color for age and race; warm; no rash NEURO: Moves all extremities equally, sensation to light touch intact diffusely, cranial nerves II through XII intact PSYCH: The patient's mood and manner are appropriate. Grooming and personal hygiene are  appropriate.  MEDICAL DECISION MAKING: Discussed with patient I suspect that her symptoms are secondary to a virus versus allergic rhinitis. I do not feel she needs to be on a different diuretic at this time. I have advised her to continue the light she is ready taking however. Chest x-ray shows no infiltrate. She states "I just need a shot of something". Discussed with her that I can give her an injection of Toradol and Decadron for symptomatic relief. I recommend discharging her on antihistamines and with steroid nasal spray. Have discussed with her that she can use Tylenol and ibuprofen at home for fever and pain as well as Benadryl at night as needed for congestion. Recommend outpatient follow-up with her primary care provider if symptoms worsen. I do not feel any life-threatening illnesses present currently. Doubt pneumonia, meningitis. Recommended increase rest and fluid intake. She verbalized understanding  and is comfortable with this plan.       [place], DO 04/24/15 [phone]

## 2015-04-24 NOTE — ED Notes (Signed)
Patient verbalizes understanding of discharge instructions, prescription medications, home care and follow up care. Patient out of department at this time. 

## 2015-04-24 NOTE — ED Notes (Signed)
Pt states that she is on her second week of antibiotics of cough, nasal congestion, chills and she is not getting any better,

## 2015-05-02 ENCOUNTER — Encounter: Payer: Self-pay | Admitting: Family Medicine

## 2015-05-02 ENCOUNTER — Ambulatory Visit (INDEPENDENT_AMBULATORY_CARE_PROVIDER_SITE_OTHER): Payer: BLUE CROSS/BLUE SHIELD | Admitting: Family Medicine

## 2015-05-02 VITALS — BP 136/88 | Temp 97.7°F | Ht 65.0 in | Wt 209.4 lb

## 2015-05-02 DIAGNOSIS — J4520 Mild intermittent asthma, uncomplicated: Secondary | ICD-10-CM | POA: Diagnosis not present

## 2015-05-02 DIAGNOSIS — R05 Cough: Secondary | ICD-10-CM

## 2015-05-02 DIAGNOSIS — I10 Essential (primary) hypertension: Secondary | ICD-10-CM | POA: Diagnosis not present

## 2015-05-02 DIAGNOSIS — R053 Chronic cough: Secondary | ICD-10-CM

## 2015-05-02 MED ORDER — DICLOFENAC SODIUM 75 MG PO TBEC: 75.0000 mg | DELAYED_RELEASE_TABLET | Freq: Two times a day (BID) | ORAL | Status: DC

## 2015-05-02 MED ORDER — ALBUTEROL SULFATE HFA 108 (90 BASE) MCG/ACT IN AERS: 2.0000 | INHALATION_SPRAY | Freq: Four times a day (QID) | RESPIRATORY_TRACT | Status: DC | PRN

## 2015-05-02 NOTE — Progress Notes (Signed)
   Subjective:    Patient ID: Kaitlin [name], female    DOB: 07/31/1955, 60 y.o.   MRN: [id number]  Cough The current episode started 1 to 4 weeks ago. The cough is productive of sputum. Associated symptoms include chest pain, ear pain and rhinorrhea. Associated symptoms comments: Abdominal Pain, Back Pain.   Patient recently went to ER on 04/24/15 for cough. Has c/o today of chest pain, and shoulder pain. Aching and sharp in nature, worse with motion and movement, pain just stays ther  Pt notes watry eyes and ears and runny nose and sutuffines  Cough, trouble catching breath, and ongoing .  Working ongoing   Patient concerned about the chest pain. Sharp in nature. Comes and goes. Seems to be somewhat worse with motion. Has a long-standing history of chest pain. Saw the cardiologist just 2 months ago with symptoms. Was advised not to have another major cardiac workup at this time.  On further history is been doing a lot of coughing over the past month. Also sense of wheeziness and shortness of breath with exertion. Next for patient also met she's been feeling down. Not overtly depressed. No suicidal thoughts. But does realize she is moody also  Review of Systems  HENT: Positive for ear pain and rhinorrhea.   Respiratory: Positive for cough.   Cardiovascular: Positive for chest pain.       Objective:   Physical Exam  Alert somewhat anxious affect. HEENT slight nasal congestion pharynx normal lungs rare wheeze with deep breath heart regular rate and rhythm. Positive. Scapular tenderness bilateral left greater than right. Some anterior chest wall tenderness to deep palpation      Assessment & Plan:  Impression chest wall pain back pain and neck pain likely from excessive coughing #2 excessive coughing likely secondary to reactive airways #3 reactive airways likely secondary to initial flu all of this discussed at great length. 25 minutes spent most in discussion. Plan hold off on any  further major workups at this point. Add Voltaren twice a day with food take faithfully. Albuterol 2 sprays 4 times a day faithfully. Expect slow resolution patient declines resumption of antidepressant

## 2015-06-01 ENCOUNTER — Ambulatory Visit (INDEPENDENT_AMBULATORY_CARE_PROVIDER_SITE_OTHER): Payer: BLUE CROSS/BLUE SHIELD | Admitting: Family Medicine

## 2015-06-01 ENCOUNTER — Encounter: Payer: Self-pay | Admitting: Family Medicine

## 2015-06-01 VITALS — BP 130/80 | Temp 99.9°F | Ht 65.0 in | Wt 209.1 lb

## 2015-06-01 DIAGNOSIS — J019 Acute sinusitis, unspecified: Secondary | ICD-10-CM

## 2015-06-01 DIAGNOSIS — R6889 Other general symptoms and signs: Secondary | ICD-10-CM

## 2015-06-01 DIAGNOSIS — J301 Allergic rhinitis due to pollen: Secondary | ICD-10-CM

## 2015-06-01 DIAGNOSIS — B9689 Other specified bacterial agents as the cause of diseases classified elsewhere: Secondary | ICD-10-CM

## 2015-06-01 MED ORDER — AZITHROMYCIN 250 MG PO TABS: ORAL_TABLET | ORAL | Status: DC

## 2015-06-01 MED ORDER — HYDROCODONE-HOMATROPINE 5-1.5 MG/5ML PO SYRP: 5.0000 mL | ORAL_SOLUTION | Freq: Four times a day (QID) | ORAL | Status: DC | PRN

## 2015-06-01 NOTE — Progress Notes (Signed)
   Subjective:    Patient ID: Kaitlin [name], female    DOB: 1955-08-05, 60 y.o.   MRN: [id number]  Cough This is a recurrent problem. The current episode started more than 1 month ago. The cough is productive of sputum. Associated symptoms include chills, ear pain, a fever, myalgias, rhinorrhea, a sore throat and wheezing. Associated symptoms comments: Abdominal pain,Congestion .  Patient relates about 6 week's worth of illness relates head congestion drainage coughing treated for sinus 2 different times of flulike illnesses well now having some body aches runny nose intermittent headaches denies vomiting diarrhea Patient has c/o skin burning.   Review of Systems  Constitutional: Positive for fever and chills.  HENT: Positive for ear pain, rhinorrhea and sore throat.   Respiratory: Positive for cough and wheezing.   Musculoskeletal: Positive for myalgias.       Objective:   Physical Exam  Constitutional: She appears well-developed.  HENT:  Head: Normocephalic.  Nose: Nose normal.  Mouth/Throat: Oropharynx is clear and moist. No oropharyngeal exudate.  Neck: Neck supple.  Cardiovascular: Normal rate and normal heart sounds.   No murmur heard. Pulmonary/Chest: Effort normal and breath sounds normal. She has no wheezes.  Lymphadenopathy:    She has no cervical adenopathy.  Skin: Skin is warm and dry.  Nursing note and vitals reviewed.    Patient does not appear toxic     Assessment & Plan:  Rhinosinusitis Viral illness Flulike illness Antibiotics prescribed for the sinuses warning signs discussed follow-up if problems I do not feel x-rays or lab work indicated.

## 2015-06-02 ENCOUNTER — Other Ambulatory Visit: Payer: Self-pay | Admitting: Family Medicine

## 2015-06-03 ENCOUNTER — Other Ambulatory Visit: Payer: Self-pay

## 2015-06-03 MED ORDER — LOVASTATIN 40 MG PO TABS: ORAL_TABLET | ORAL | Status: DC

## 2015-06-14 ENCOUNTER — Ambulatory Visit (HOSPITAL_COMMUNITY)
Admission: RE | Admit: 2015-06-14 | Discharge: 2015-06-14 | Disposition: A | Discharge status: Home / Self Care | Payer: BLUE CROSS/BLUE SHIELD | Source: Ambulatory Visit | Attending: Family Medicine | Admitting: Family Medicine

## 2015-06-14 ENCOUNTER — Other Ambulatory Visit: Payer: Self-pay | Admitting: Obstetrics & Gynecology

## 2015-06-14 ENCOUNTER — Ambulatory Visit (INDEPENDENT_AMBULATORY_CARE_PROVIDER_SITE_OTHER): Payer: BLUE CROSS/BLUE SHIELD | Admitting: Family Medicine

## 2015-06-14 ENCOUNTER — Encounter: Payer: Self-pay | Admitting: Family Medicine

## 2015-06-14 ENCOUNTER — Telehealth: Payer: Self-pay | Admitting: Family Medicine

## 2015-06-14 VITALS — BP 122/80 | Temp 97.9°F | Ht 65.0 in | Wt 205.0 lb

## 2015-06-14 DIAGNOSIS — R5383 Other fatigue: Secondary | ICD-10-CM | POA: Diagnosis not present

## 2015-06-14 DIAGNOSIS — Z1231 Encounter for screening mammogram for malignant neoplasm of breast: Secondary | ICD-10-CM

## 2015-06-14 DIAGNOSIS — R300 Dysuria: Secondary | ICD-10-CM

## 2015-06-14 DIAGNOSIS — R7301 Impaired fasting glucose: Secondary | ICD-10-CM

## 2015-06-14 DIAGNOSIS — R05 Cough: Secondary | ICD-10-CM | POA: Insufficient documentation

## 2015-06-14 DIAGNOSIS — E039 Hypothyroidism, unspecified: Secondary | ICD-10-CM | POA: Diagnosis not present

## 2015-06-14 DIAGNOSIS — J9811 Atelectasis: Secondary | ICD-10-CM | POA: Insufficient documentation

## 2015-06-14 DIAGNOSIS — Z79899 Other long term (current) drug therapy: Secondary | ICD-10-CM | POA: Diagnosis not present

## 2015-06-14 DIAGNOSIS — E785 Hyperlipidemia, unspecified: Secondary | ICD-10-CM

## 2015-06-14 DIAGNOSIS — R059 Cough, unspecified: Secondary | ICD-10-CM

## 2015-06-14 DIAGNOSIS — R0602 Shortness of breath: Secondary | ICD-10-CM | POA: Diagnosis not present

## 2015-06-14 LAB — POCT URINALYSIS DIPSTICK
PH UA: 7
SPEC GRAV UA: 1.015

## 2015-06-14 IMAGING — DX DG CHEST 2V
2 series · 2 of 2 positions shown · non-contrast
Comparison: [DATE]

CLINICAL DATA: Productive cough and shortness of breath.

EXAM:
CHEST  2 VIEW

[chest pa]
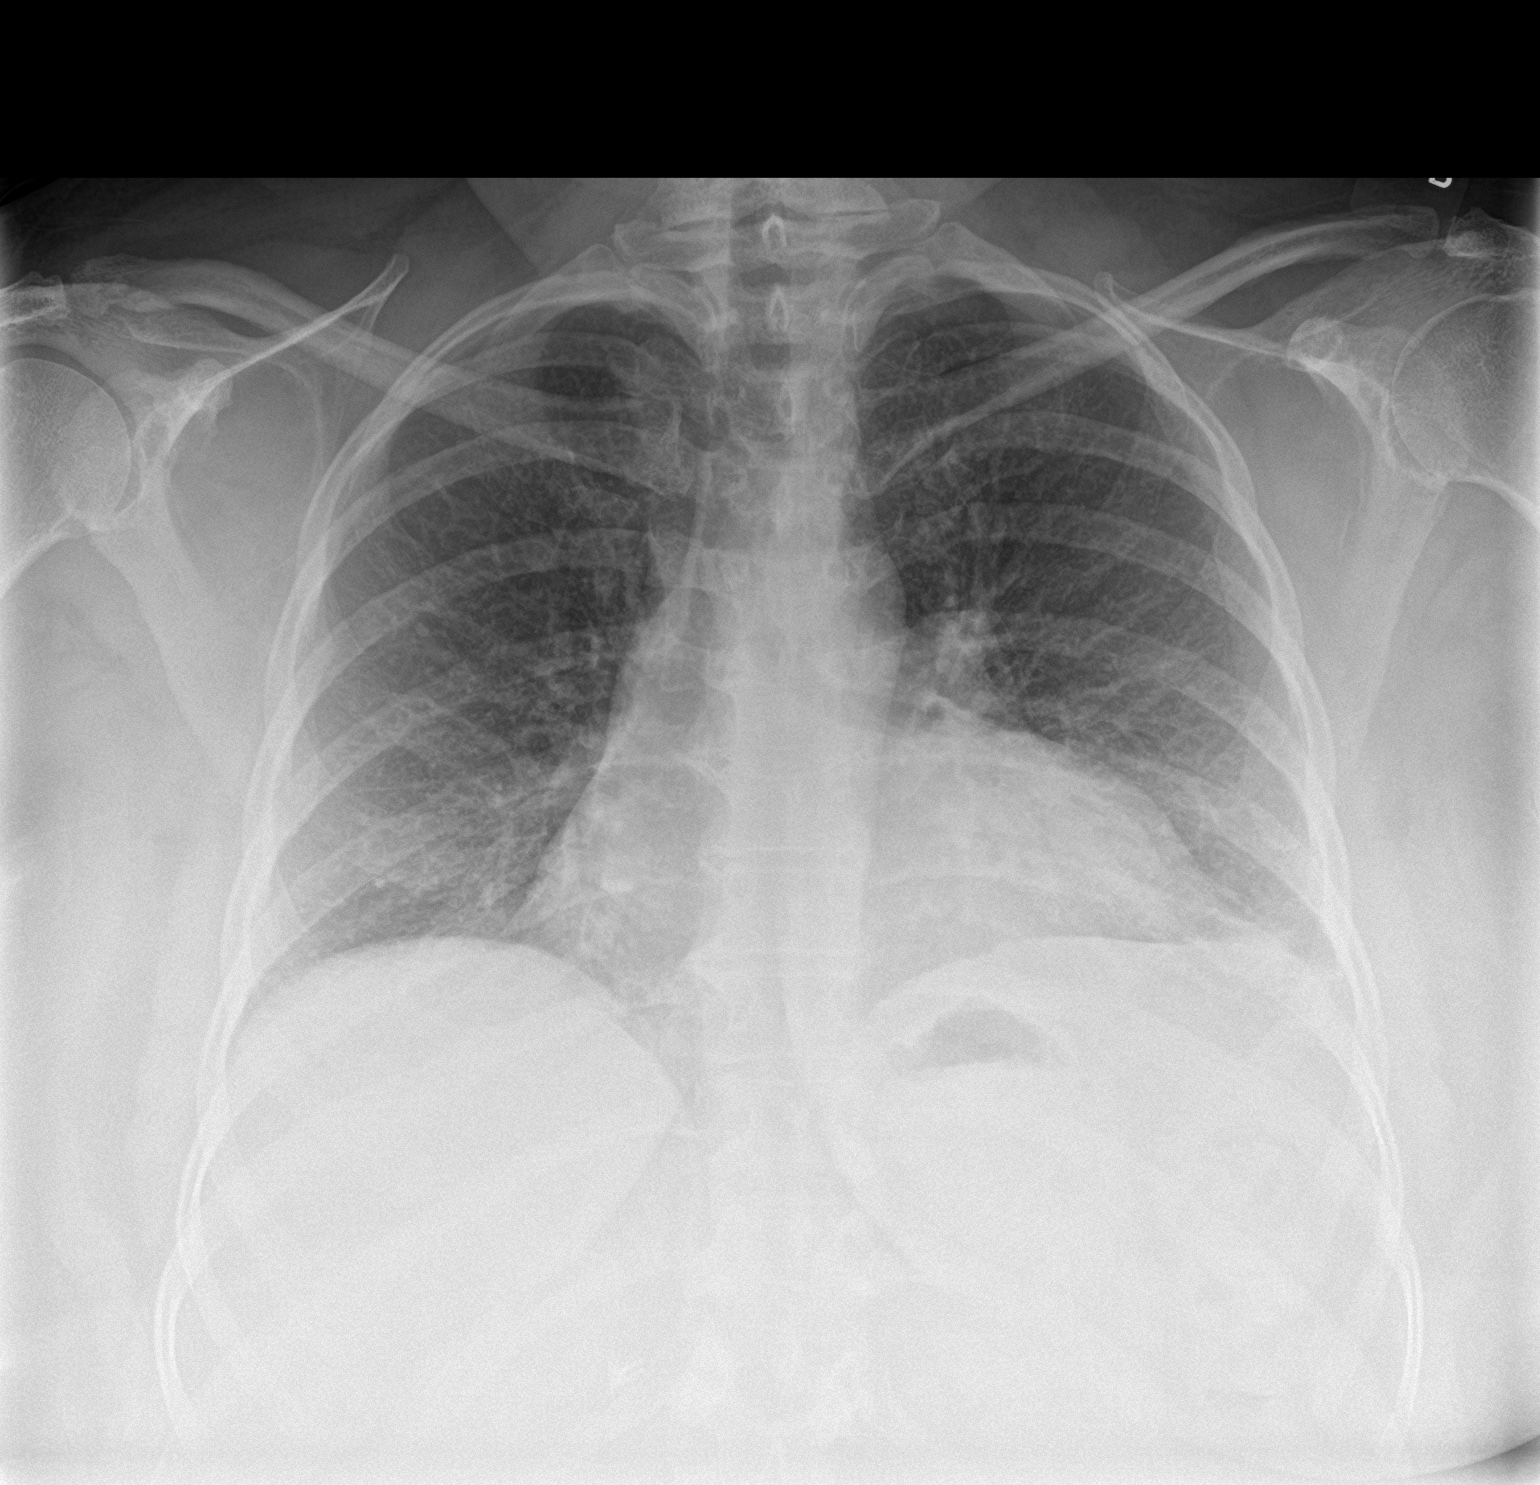

[chest lat]
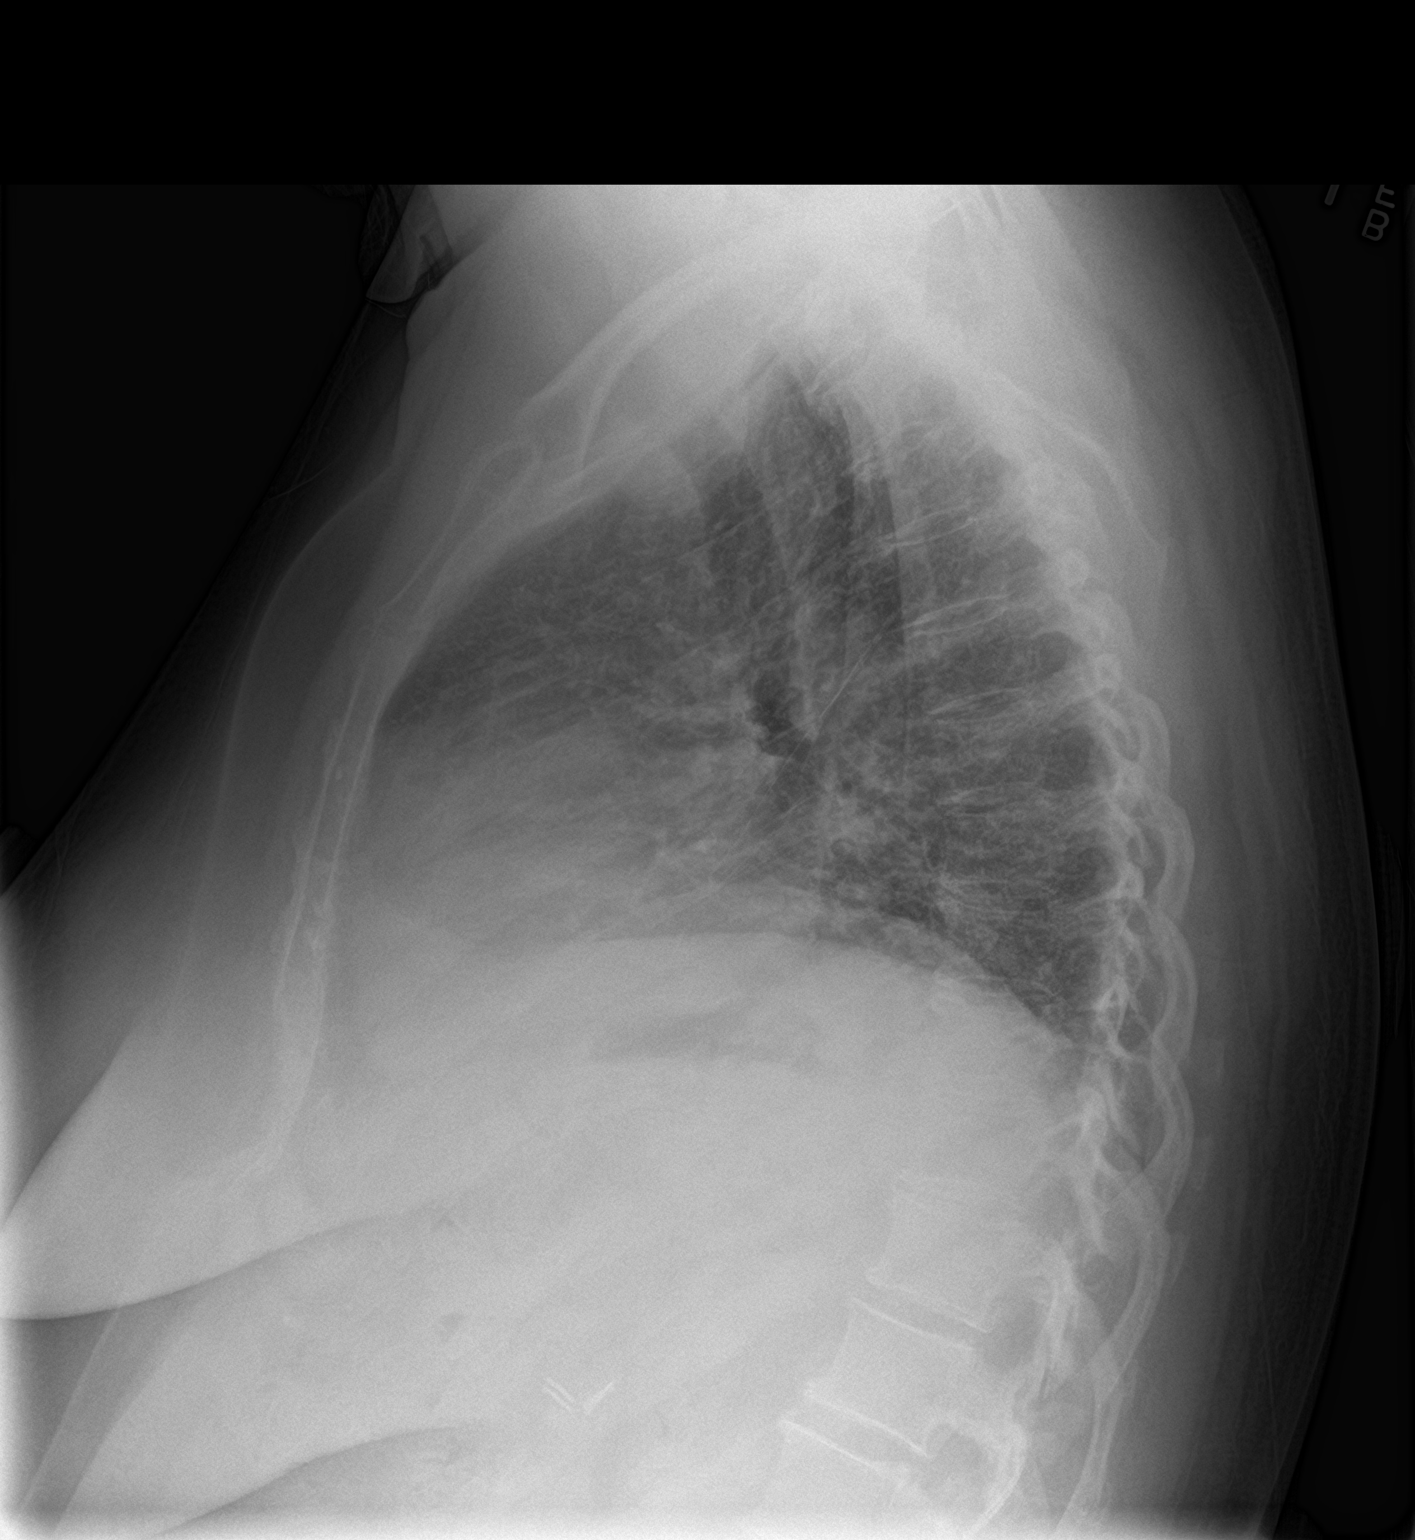

[2 of 2 positions shown; findings below may reference images not displayed]

FINDINGS: Heart size is normal. No pleural effusion or edema. Atelectasis is
noted in the lung bases. No airspace consolidation. Mild spondylosis
within the thoracic spine.
IMPRESSION: 1. Bibasilar atelectasis.

## 2015-06-14 MED ORDER — FLUTICASONE PROPIONATE HFA 110 MCG/ACT IN AERO: 2.0000 | INHALATION_SPRAY | Freq: Two times a day (BID) | RESPIRATORY_TRACT | Status: DC

## 2015-06-14 MED ORDER — ALBUTEROL SULFATE HFA 108 (90 BASE) MCG/ACT IN AERS: 2.0000 | INHALATION_SPRAY | Freq: Four times a day (QID) | RESPIRATORY_TRACT | Status: DC | PRN

## 2015-06-14 MED ORDER — DOXYCYCLINE HYCLATE 100 MG PO TABS: 100.0000 mg | ORAL_TABLET | Freq: Two times a day (BID) | ORAL | Status: DC

## 2015-06-14 NOTE — Progress Notes (Signed)
   Subjective:    Patient ID: Kaitlin [name], female    DOB: 1956-01-01, 60 y.o.   MRN: [id number]  patient arrives office with numerous concerns    Cough This is a new problem. The current episode started more than 1 month ago. The problem has been unchanged. Associated symptoms include shortness of breath. Associated symptoms comments: Back pain . Nothing aggravates the symptoms. Treatments tried: antibiotics. The treatment provided no relief.  Dysuria  This is a new problem. The current episode started in the past 7 days. The problem occurs every urination. The problem has been unchanged. The quality of the pain is described as burning. The pain is moderate. There has been no fever. She has tried nothing for the symptoms. The treatment provided no relief.   Patient has a cyst on her right leg she would like the doctor to look at.  Back pain, coughing  , cough productive at times patient worried about something like pneumonia   no energy fatigue tiredness. This been going on for the last couple months.   history of calcium abnormalities. No recent checking. Patient did not go back to endocrinologist a follow-up on this  No enerrgy, short winded Burning, darker than usual at times,     Results for orders placed or performed in visit on 06/14/15  POCT urinalysis dipstick  Result Value Ref Range   Color, UA     Clarity, UA     Glucose, UA     Bilirubin, UA     Ketones, UA     Spec Grav, UA 1.015    Blood, UA     pH, UA 7.0    Protein, UA     Urobilinogen, UA     Nitrite, UA     Leukocytes, UA  Negative   Review of Systems  Respiratory: Positive for cough and shortness of breath.   Genitourinary: Positive for dysuria.       Objective:   Physical Exam   alert vital stable HET moderate his congestion pharynx normal lungs clear heart rare rhythm imaging cough during exam skin benign lesion noted ankles no significant edema next  Urinalysis 2 white blood cells  high-power field      Assessment & Plan:   impression 1 rhinosinusitis /bronchitis #2 subacute cough #3 impaired fasting glucose as uncertain #4 fatigue substantial #5 history of calcium metabolism abnormalities plan appropriate blood work diet exercise discussed antibiotics prescribed symptom care discussed further recommendations based results [facility]

## 2015-06-14 NOTE — Telephone Encounter (Signed)
Ov first, come preppared to give urine

## 2015-06-14 NOTE — Telephone Encounter (Signed)
Patient called stating she has no energy,chills and urine is very strong and dark sometimes. She is requesting complete lab work -up.

## 2015-06-14 NOTE — Telephone Encounter (Signed)
Office visit scheduled.

## 2015-06-20 ENCOUNTER — Other Ambulatory Visit: Payer: Self-pay | Admitting: Family Medicine

## 2015-06-20 DIAGNOSIS — R300 Dysuria: Secondary | ICD-10-CM | POA: Diagnosis not present

## 2015-06-20 DIAGNOSIS — E039 Hypothyroidism, unspecified: Secondary | ICD-10-CM | POA: Diagnosis not present

## 2015-06-20 DIAGNOSIS — R05 Cough: Secondary | ICD-10-CM | POA: Diagnosis not present

## 2015-06-20 DIAGNOSIS — E785 Hyperlipidemia, unspecified: Secondary | ICD-10-CM | POA: Diagnosis not present

## 2015-06-20 LAB — TSH: TSH: 2.07 mIU/L

## 2015-06-20 LAB — CBC WITH DIFFERENTIAL/PLATELET
BASOS PCT: 1 %
Basophils Absolute: 81 cells/uL (ref 0–200)
EOS PCT: 2 %
Eosinophils Absolute: 162 cells/uL (ref 15–500)
HCT: 40.7 % (ref 35.0–45.0)
Hemoglobin: 13.3 g/dL (ref 11.7–15.5)
LYMPHS PCT: 38 %
Lymphs Abs: 3078 cells/uL (ref 850–3900)
MCH: 27.6 pg (ref 27.0–33.0)
MCHC: 32.7 g/dL (ref 32.0–36.0)
MCV: 84.4 fL (ref 80.0–100.0)
MONO ABS: 486 {cells}/uL (ref 200–950)
MONOS PCT: 6 %
MPV: 9.5 fL (ref 7.5–12.5)
NEUTROS PCT: 53 %
Neutro Abs: 4293 cells/uL (ref 1500–7800)
PLATELETS: 336 10*3/uL (ref 140–400)
RBC: 4.82 MIL/uL (ref 3.80–5.10)
RDW: 13.9 % (ref 11.0–15.0)
WBC: 8.1 10*3/uL (ref 3.8–10.8)

## 2015-06-20 LAB — BASIC METABOLIC PANEL
BUN: 15 mg/dL (ref 7–25)
CHLORIDE: 108 mmol/L (ref 98–110)
CO2: 20 mmol/L (ref 20–31)
Calcium: 10.2 mg/dL (ref 8.6–10.4)
Creat: 0.74 mg/dL (ref 0.50–1.05)
Glucose, Bld: 119 mg/dL — ABNORMAL HIGH (ref 65–99)
POTASSIUM: 4.4 mmol/L (ref 3.5–5.3)
SODIUM: 139 mmol/L (ref 135–146)

## 2015-06-20 LAB — HEPATIC FUNCTION PANEL
ALBUMIN: 4.1 g/dL (ref 3.6–5.1)
ALT: 24 U/L (ref 6–29)
AST: 22 U/L (ref 10–35)
Alkaline Phosphatase: 92 U/L (ref 33–130)
BILIRUBIN INDIRECT: 0.2 mg/dL (ref 0.2–1.2)
Bilirubin, Direct: 0.1 mg/dL (ref <=0.2)
TOTAL PROTEIN: 7 g/dL (ref 6.1–8.1)
Total Bilirubin: 0.3 mg/dL (ref 0.2–1.2)

## 2015-06-20 LAB — HEMOGLOBIN A1C
HEMOGLOBIN A1C: 6.6 % — AB (ref <5.7)
MEAN PLASMA GLUCOSE: 143 mg/dL

## 2015-06-20 LAB — LIPID PANEL
CHOL/HDL RATIO: 3.4 ratio (ref <=5.0)
CHOLESTEROL: 155 mg/dL (ref 125–200)
HDL: 46 mg/dL (ref >=46)
LDL CALC: 93 mg/dL (ref <130)
TRIGLYCERIDES: 81 mg/dL (ref <150)
VLDL: 16 mg/dL (ref <30)

## 2015-06-21 LAB — PTH, INTACT AND CALCIUM
CALCIUM: 10.3 mg/dL (ref 8.4–10.5)
PTH: 60 pg/mL (ref 14–64)

## 2015-06-23 ENCOUNTER — Ambulatory Visit (HOSPITAL_COMMUNITY)
Admission: RE | Admit: 2015-06-23 | Discharge: 2015-06-23 | Disposition: A | Discharge status: Home / Self Care | Payer: BLUE CROSS/BLUE SHIELD | Source: Ambulatory Visit | Attending: Obstetrics & Gynecology | Admitting: Obstetrics & Gynecology

## 2015-06-23 DIAGNOSIS — Z1231 Encounter for screening mammogram for malignant neoplasm of breast: Secondary | ICD-10-CM | POA: Diagnosis not present

## 2015-06-28 ENCOUNTER — Encounter: Payer: Self-pay | Admitting: Internal Medicine

## 2015-06-28 ENCOUNTER — Ambulatory Visit (INDEPENDENT_AMBULATORY_CARE_PROVIDER_SITE_OTHER): Payer: BLUE CROSS/BLUE SHIELD | Admitting: Internal Medicine

## 2015-06-28 VITALS — BP 140/92 | HR 80 | Temp 97.3°F | Ht 65.0 in | Wt 205.6 lb

## 2015-06-28 DIAGNOSIS — K5909 Other constipation: Secondary | ICD-10-CM | POA: Diagnosis not present

## 2015-06-28 DIAGNOSIS — K219 Gastro-esophageal reflux disease without esophagitis: Secondary | ICD-10-CM | POA: Diagnosis not present

## 2015-06-28 NOTE — Patient Instructions (Addendum)
Begin Linzess 145 daily to every other day as discussed  Continue Protonix 40 mg daily  Try and lose 10-15 pounds between now and the end of the year-this will be of great benefit to your health overall.  Office visit with [name] in 6 months  Repeat colonoscopy 2020

## 2015-06-28 NOTE — Progress Notes (Signed)
Primary Care Physician:  Mickie Hillier, MD Primary Gastroenterologist:  Dr. Gala Romney  Pre-Procedure History & Physical: HPI:  Kaitlin [name] is a 60 y.o. female here for follow-up of GERD, constipation and colonic polyps. GERD symptoms well controlled on Protonix 40 mg daily. No dysphagia. Constipation improved on Linzess  290 but it caused rectal irritation; she stopped taking it. Patient goes 3-4 days at times without bowel movement. History of advanced adenoma removed from her colon previously with essentially negative colonoscopy 2015; due for surveillance colonoscopy 2020.  Past Medical History  Diagnosis Date  . BMI 30.0-30.9,adult 2008 182 LBS    2009 194 LBS  . Helicobacter pylori gastritis AUG 2008 PREVPAC: nausea-->HELIDAC: GI UPSET    HB 14.1 NL HFP H. PYLORI STOOL Ag NEG  . Colon polyp APR 2008  . Anxiety   . Essential hypertension, benign   . Hypothyroidism   . Mixed hyperlipidemia   . Irritable bowel syndrome   . Tubulovillous adenoma 08/07/10    Colonoscopy w/ Dr Oneida Alar w/ 7 polyps removed-bx showed tubular adenomas.  Largest 1cm-hepatic flexure polyp->TA.    Marland Kitchen Panic attacks   . Reflux   . Kidney stones   . Depression   . Folliculitis [id number]  . Mixed stress and urge urinary incontinence 09/26/2012  . Polyp of colon, hyperplastic     Past Surgical History  Procedure Laterality Date  . Colonoscopy  APR 2008    AC/[place] SIMPLE ADENOMA  . Upper gastrointestinal endoscopy  AUG 2008 PAIN/NAUSEA    H. PYLORI treated  . Cholecystectomy    . Mass excision  02/28/2011    Procedure: EXCISION MASS;  Surgeon: Donato Heinz, MD;  Location: [facility];  Service: General;  Laterality: Right;  soft tissue mass  . Colonoscopy  08/07/2010    Dr. Raynald Kemp adenomas and tubulovillous adenoma; needs surveillance 2015  . Colonoscopy N/A 09/18/2013    Dr.Lashawnna Lambrecht- normal rectum, 2 diminutive polys in the mid sigmoid segment o/w the remainder of the colonic mucosa appeared  normal.hyperplastic poylps  . Esophagogastroduodenoscopy N/A 11/29/2014    RMR: Gastric erosions. Small hiatal hernia. Status post biopsy.     Prior to Admission medications   Medication Sig Start Date End Date Taking? Authorizing Provider  albuterol (PROVENTIL HFA;VENTOLIN HFA) 108 (90 Base) MCG/ACT inhaler Inhale 2 puffs into the lungs every 6 (six) hours as needed for wheezing or shortness of breath. 06/14/15  Yes [name], MD  ALPRAZolam Duanne Moron) 1 MG tablet TAKE 1 TABLET 3 TIMES A DAY 01/24/15  Yes [name], MD  diclofenac (VOLTAREN) 75 MG EC tablet Take 1 tablet (75 mg total) by mouth 2 (two) times daily. 05/02/15  Yes [name], MD  doxycycline (VIBRA-TABS) 100 MG tablet Take 1 tablet (100 mg total) by mouth 2 (two) times daily. X 10 days 06/14/15  Yes [name], MD  enalapril (VASOTEC) 20 MG tablet Take 1 tablet (20 mg total) by mouth at bedtime. 11/12/14  Yes [name], MD  fluticasone (FLOVENT HFA) 110 MCG/ACT inhaler Inhale 2 puffs into the lungs 2 (two) times daily. 06/14/15  Yes [name], MD  furosemide (LASIX) 20 MG tablet Take 0.5 tablets (10 mg total) by mouth daily. 03/03/15  Yes Renato Shin, MD  HYDROcodone-homatropine [facility]) 5-1.5 MG/5ML syrup Take 5 mLs by mouth every 6 (six) hours as needed for cough. 06/01/15  Yes Kathyrn Drown, MD  ibuprofen (ADVIL,MOTRIN) 800 MG tablet Take 1 tablet (800  mg total) by mouth every 8 (eight) hours as needed for mild pain. 04/24/15  Yes Kristen N Ward, DO  levothyroxine (SYNTHROID, LEVOTHROID) 50 MCG tablet Take 1 tablet (50 mcg total) by mouth daily. 11/12/14  Yes [name], MD  lovastatin (MEVACOR) 40 MG tablet TAKE 1 TABLET (40 MG TOTAL) BY MOUTH AT BEDTIME. 06/03/15  Yes [name], MD  pantoprazole (PROTONIX) 40 MG tablet Take 40 mg by mouth daily. 11/29/14  Yes Historical Provider, MD  verapamil (CALAN-SR) 240 MG CR tablet TAKE 1 TABLET (240 MG TOTAL) BY MOUTH DAILY. 06/02/15  Yes [name], MD  Vitamin D, Ergocalciferol, (DRISDOL) 50000 UNITS CAPS capsule Take 1 capsule (50,000 Units total) by mouth every 7 (seven) days. 12/27/14  Yes Renato Shin, MD  azithromycin (ZITHROMAX Z-PAK) 250 MG tablet Take 2 tablets (500 mg) on  Day 1,  followed by 1 tablet (250 mg) once daily on Days 2 through 5. Patient not taking: Reported on 06/14/2015 06/01/15   Kathyrn Drown, MD  fluticasone (FLONASE) 50 MCG/ACT nasal spray Place 2 sprays into both nostrils daily. Patient not taking: Reported on 06/28/2015 04/24/15   Delice Bison Ward, DO  loratadine (CLARITIN) 10 MG tablet Take 1 tablet (10 mg total) by mouth daily. Patient not taking: Reported on 06/14/2015 04/24/15   Kristen N Ward, DO  sucralfate (CARAFATE) 1 GM/10ML suspension Take 10 mLs (1 g total) by mouth 4 (four) times daily -  with meals and at bedtime. 12/18/14 12/25/14  Carmin Muskrat, MD    Allergies as of 06/28/2015 - Review Complete 06/28/2015  Allergen Reaction Noted  . Asa [aspirin] Other (See Comments) 06/09/2012  . Augmentin [amoxicillin-pot clavulanate] Diarrhea 06/09/2012  . Levaquin [levofloxacin in d5w] Other (See Comments) 07/14/2012  . Rocephin [ceftriaxone sodium in dextrose] Hives and Other (See Comments) 06/09/2012  . Valtrex [valacyclovir hcl] Hives and Other (See Comments) 06/09/2012  . Zoloft [sertraline hcl] Other (See Comments) 06/09/2012    Family History  Problem Relation Age of Onset  . Colon cancer Neg Hx   . Anesthesia problems Neg Hx   . Hypotension Neg Hx   . Malignant hyperthermia Neg Hx   . Pseudochol deficiency Neg Hx   . Coronary artery disease Mother   . Hypertension Mother   . Heart attack Mother   . Heart disease Mother   . Coronary artery disease Brother     Age 60  . Heart attack Brother   . Heart disease Brother   . Diabetes Sister   . Diabetes Sister     Social History   Social History  . Marital Status: Married    Spouse Name: N/A  . Number of Children: N/A  . Years of  Education: N/A   Occupational History  . Not on file.   Social History Main Topics  . Smoking status: Never Smoker   . Smokeless tobacco: Never Used     Comment: Never Smoked  . Alcohol Use: No  . Drug Use: No  . Sexual Activity: No   Other Topics Concern  . Not on file   Social History Narrative    Review of Systems: See HPI, otherwise negative ROS  Physical Exam: BP 140/92 mmHg  Pulse 80  Temp(Src) 97.3 F (36.3 C)  Ht 5\' 5"  (1.651 m)  Wt 205 lb 9.6 oz (93.26 kg)  BMI 34.21 kg/m2 General:   Alert,   pleasant and cooperative in NAD Skin:  Intact without significant lesions or  rashes. Neck:  Supple; no masses or thyromegaly. No significant cervical adenopathy. Lungs:  Clear throughout to auscultation.   No wheezes, crackles, or rhonchi. No acute distress. Heart:  Regular rate and rhythm; no murmurs, clicks, rubs,  or gallops. Abdomen: Non-distended, normal bowel sounds.  Soft and nontender without appreciable mass or hepatosplenomegaly.  Pulses:  Normal pulses noted. Extremities:  Without clubbing or edema.  Impression:   Pleasant obese 60 year old lady with long-standing GERD-now well controlled on Protonix 40 mg daily. Intermittent bloating and constipation. Linzess 290 was too much. She may have briefly tried Linzess 145 previously but results of that trial not recalled by patient.  I think it would be worth revisiting the lower dose. Discussed weight loss as part of the multi-pronged approach in the treatment of reflux. Weight loss would be of significant overall benefit to her health.   Recommendations:  Begin Linzess 145 daily to every other day as discussed  Continue Protonix 40 mg daily  Try and lose 10-15 pounds between now and the end of the year-this will be of great benefit to health overall.  Office visit with [name] in 6 months  Repeat colonoscopy 2020      Notice: This dictation was prepared with Dragon dictation along with smaller phrase  technology. Any transcriptional errors that result from this process are unintentional and may not be corrected upon review.

## 2015-07-04 ENCOUNTER — Ambulatory Visit (INDEPENDENT_AMBULATORY_CARE_PROVIDER_SITE_OTHER): Payer: BLUE CROSS/BLUE SHIELD | Admitting: Family Medicine

## 2015-07-04 ENCOUNTER — Encounter: Payer: Self-pay | Admitting: Family Medicine

## 2015-07-04 VITALS — BP 120/82 | Ht 65.0 in | Wt 203.2 lb

## 2015-07-04 DIAGNOSIS — E119 Type 2 diabetes mellitus without complications: Secondary | ICD-10-CM

## 2015-07-04 DIAGNOSIS — I10 Essential (primary) hypertension: Secondary | ICD-10-CM

## 2015-07-04 DIAGNOSIS — E785 Hyperlipidemia, unspecified: Secondary | ICD-10-CM | POA: Diagnosis not present

## 2015-07-04 MED ORDER — BLOOD GLUCOSE METER KIT: PACK | Status: AC

## 2015-07-04 NOTE — Progress Notes (Signed)
Subjective:    Patient ID: Kaitlin [name], female    DOB: 1955/03/21, 60 y.o.   MRN: [id number]  HPI  Patient arrives to discuss recent lab results that showed new onset diabetes.  Results for orders placed or performed in visit on 06/20/15  CBC with Differential/Platelet  Result Value Ref Range   WBC 8.1 3.8 - 10.8 K/uL   RBC 4.82 3.80 - 5.10 MIL/uL   Hemoglobin 13.3 11.7 - 15.5 g/dL   HCT 40.7 35.0 - 45.0 %   MCV 84.4 80.0 - 100.0 fL   MCH 27.6 27.0 - 33.0 pg   MCHC 32.7 32.0 - 36.0 g/dL   RDW 13.9 11.0 - 15.0 %   Platelets 336 140 - 400 K/uL   MPV 9.5 7.5 - 12.5 fL   Neutro Abs 4293 1500 - 7800 cells/uL   Lymphs Abs 3078 850 - 3900 cells/uL   Monocytes Absolute 486 200 - 950 cells/uL   Eosinophils Absolute 162 15 - 500 cells/uL   Basophils Absolute 81 0 - 200 cells/uL   Neutrophils Relative % 53 %   Lymphocytes Relative 38 %   Monocytes Relative 6 %   Eosinophils Relative 2 %   Basophils Relative 1 %   Smear Review Criteria for review not met   Basic metabolic panel  Result Value Ref Range   Sodium 139 135 - 146 mmol/L   Potassium 4.4 3.5 - 5.3 mmol/L   Chloride 108 98 - 110 mmol/L   CO2 20 20 - 31 mmol/L   Glucose, Bld 119 (H) 65 - 99 mg/dL   BUN 15 7 - 25 mg/dL   Creat 0.74 0.50 - 1.05 mg/dL   Calcium 10.2 8.6 - 10.4 mg/dL  Lipid panel  Result Value Ref Range   Cholesterol 155 125 - 200 mg/dL   Triglycerides 81 <150 mg/dL   HDL 46 >=46 mg/dL   Total CHOL/HDL Ratio 3.4 <=5.0 Ratio   VLDL 16 <30 mg/dL   LDL Cholesterol 93 <130 mg/dL  Hepatic function panel  Result Value Ref Range   Total Bilirubin 0.3 0.2 - 1.2 mg/dL   Bilirubin, Direct 0.1 <=0.2 mg/dL   Indirect Bilirubin 0.2 0.2 - 1.2 mg/dL   Alkaline Phosphatase 92 33 - 130 U/L   AST 22 10 - 35 U/L   ALT 24 6 - 29 U/L   Total Protein 7.0 6.1 - 8.1 g/dL   Albumin 4.1 3.6 - 5.1 g/dL  TSH  Result Value Ref Range   TSH 2.07 mIU/L  Hemoglobin A1c  Result Value Ref Range   Hgb A1c MFr Bld 6.6 (H)  <5.7 %   Mean Plasma Glucose 143 mg/dL    Exercise works on thired shift, walks all night, did  Lot of landscaping work   Positive family history of diabetes. Has 2 siblings. One on insulin.  Has been watching sugars drift up for a while.  NOW ADMITS TO DIETARY NONCOMPLIANCE OFTEN DRINKING FLUIDS SUGARS  NO SYMPTOMS OF HIGH SUGAR  Review of Systems No headache, no major weight loss or weight gain, no chest pain no back pain abdominal pain no change in bowel habits complete ROS otherwise negative     Objective:   Physical Exam Alert vitals stable blood pressure good on repeat HEENT normal lungs clear heart regular in rhythm ankles without edema       Assessment & Plan:  Impression new onset type 2 diabetes discussed at great length. #2 other blood work concerns  discussed see blood work results plan recommend diabetes educational session [facility], no medications at this time, long-term, patient of diabetes discussed, glucometer prescribed check a least 1 sugar per week, importance of yearly eye visits discussed, tight control other risk factors discussed. Current LDL and blood pressure falls within expected criteria 25 minutes spent most in discussion recheck in several months A1c then also WSL

## 2015-07-04 NOTE — Patient Instructions (Signed)
Start checking one fasting sugar each morning  Go to unsweetened drinks only  Cut down carbs as much as you can  Yearly eye doc visits are important  Education session at the hospital important

## 2015-07-06 ENCOUNTER — Other Ambulatory Visit: Payer: Self-pay | Admitting: Family Medicine

## 2015-07-07 ENCOUNTER — Ambulatory Visit (INDEPENDENT_AMBULATORY_CARE_PROVIDER_SITE_OTHER): Payer: BLUE CROSS/BLUE SHIELD | Admitting: Obstetrics & Gynecology

## 2015-07-07 ENCOUNTER — Encounter: Payer: Self-pay | Admitting: Obstetrics & Gynecology

## 2015-07-07 ENCOUNTER — Other Ambulatory Visit (HOSPITAL_COMMUNITY)
Admission: RE | Admit: 2015-07-07 | Discharge: 2015-07-07 | Disposition: A | Discharge status: Home / Self Care | Payer: BLUE CROSS/BLUE SHIELD | Source: Ambulatory Visit | Attending: Obstetrics & Gynecology | Admitting: Obstetrics & Gynecology

## 2015-07-07 VITALS — BP 130/90 | HR 72 | Ht 65.0 in | Wt 200.0 lb

## 2015-07-07 DIAGNOSIS — Z131 Encounter for screening for diabetes mellitus: Secondary | ICD-10-CM

## 2015-07-07 DIAGNOSIS — Z01419 Encounter for gynecological examination (general) (routine) without abnormal findings: Secondary | ICD-10-CM | POA: Diagnosis not present

## 2015-07-07 DIAGNOSIS — R7309 Other abnormal glucose: Secondary | ICD-10-CM | POA: Diagnosis not present

## 2015-07-07 LAB — GLUCOSE, POCT (MANUAL RESULT ENTRY): POC Glucose: 76 mg/dl (ref 70–99)

## 2015-07-07 MED ORDER — FLUCONAZOLE 100 MG PO TABS: 100.0000 mg | ORAL_TABLET | Freq: Every day | ORAL | Status: DC

## 2015-07-07 NOTE — Addendum Note (Signed)
Addended by: Diona Fanti A on: 07/07/2015 12:28 PM   Modules accepted: Orders

## 2015-07-07 NOTE — Progress Notes (Signed)
Patient ID: Kaitlin [name], female   DOB: [birth year], 60 y.o.   MRN: [id number] Subjective:     Kaitlin [name] is a 60 y.o. female here for a routine exam.  No LMP recorded. Patient is postmenopausal. G1P1 Birth Control Method:  Post menopausal Menstrual Calendar(currently): amenorrheic  Current complaints: none.   Current acute medical issues:  Newly diagnosed diabetes   Recent Gynecologic History No LMP recorded. Patient is postmenopausal. Last Pap: 2016,  normal Last mammogram: 2017,  normal  Past Medical History  Diagnosis Date  . BMI 30.0-30.9,adult 2008 182 LBS    2009 194 LBS  . Helicobacter pylori gastritis AUG 2008 PREVPAC: nausea-->HELIDAC: GI UPSET    HB 14.1 NL HFP H. PYLORI STOOL Ag NEG  . Colon polyp APR 2008  . Anxiety   . Essential hypertension, benign   . Hypothyroidism   . Mixed hyperlipidemia   . Irritable bowel syndrome   . Tubulovillous adenoma 08/07/10    Colonoscopy w/ Dr Oneida Alar w/ 7 polyps removed-bx showed tubular adenomas.  Largest 1cm-hepatic flexure polyp->TA.    Marland Kitchen Panic attacks   . Reflux   . Kidney stones   . Depression   . Folliculitis 7/78/2423  . Mixed stress and urge urinary incontinence 09/26/2012  . Polyp of colon, hyperplastic     Past Surgical History  Procedure Laterality Date  . Colonoscopy  APR 2008    AC/[place] SIMPLE ADENOMA  . Upper gastrointestinal endoscopy  AUG 2008 PAIN/NAUSEA    H. PYLORI treated  . Cholecystectomy    . Mass excision  02/28/2011    Procedure: EXCISION MASS;  Surgeon: Donato Heinz, MD;  Location: [facility];  Service: General;  Laterality: Right;  soft tissue mass  . Colonoscopy  08/07/2010    Dr. Raynald Kemp adenomas and tubulovillous adenoma; needs surveillance 2015  . Colonoscopy N/A 09/18/2013    Dr.Rourk- normal rectum, 2 diminutive polys in the mid sigmoid segment o/w the remainder of the colonic mucosa appeared normal.hyperplastic poylps  . Esophagogastroduodenoscopy N/A 11/29/2014    RMR: Gastric  erosions. Small hiatal hernia. Status post biopsy.     OB History    Gravida Para Term Preterm AB TAB SAB Ectopic Multiple Living   1 1              Social History   Social History  . Marital Status: Married    Spouse Name: N/A  . Number of Children: N/A  . Years of Education: N/A   Social History Main Topics  . Smoking status: Never Smoker   . Smokeless tobacco: Never Used     Comment: Never Smoked  . Alcohol Use: No  . Drug Use: No  . Sexual Activity: No   Other Topics Concern  . None   Social History Narrative    Family History  Problem Relation Age of Onset  . Colon cancer Neg Hx   . Anesthesia problems Neg Hx   . Hypotension Neg Hx   . Malignant hyperthermia Neg Hx   . Pseudochol deficiency Neg Hx   . Coronary artery disease Mother   . Hypertension Mother   . Heart attack Mother   . Heart disease Mother   . Coronary artery disease Brother     Age 63  . Heart attack Brother   . Heart disease Brother   . Diabetes Sister   . Diabetes Sister      Current outpatient prescriptions:  .  albuterol (PROVENTIL HFA;VENTOLIN HFA) 108 (90  Base) MCG/ACT inhaler, Inhale 2 puffs into the lungs every 6 (six) hours as needed for wheezing or shortness of breath., Disp: 1 Inhaler, Rfl: 0 .  ALPRAZolam (XANAX) 1 MG tablet, TAKE 1 TABLET 3 TIMES A DAY, Disp: 90 tablet, Rfl: 5 .  blood glucose meter kit and supplies, Dispense based on patient and insurance preference. Test once a day E11.9, Disp: 1 each, Rfl: 0 .  enalapril (VASOTEC) 20 MG tablet, Take 1 tablet (20 mg total) by mouth at bedtime., Disp: 30 tablet, Rfl: 5 .  fluticasone (FLOVENT HFA) 110 MCG/ACT inhaler, Inhale 2 puffs into the lungs 2 (two) times daily., Disp: 1 Inhaler, Rfl: 2 .  furosemide (LASIX) 20 MG tablet, Take 0.5 tablets (10 mg total) by mouth daily., Disp: 30 tablet, Rfl: 1 .  levothyroxine (SYNTHROID, LEVOTHROID) 50 MCG tablet, TAKE 1 TABLET (50 MCG TOTAL) BY MOUTH DAILY., Disp: 30 tablet, Rfl: 4 .   loratadine (CLARITIN) 10 MG tablet, Take 1 tablet (10 mg total) by mouth daily., Disp: 30 tablet, Rfl: 0 .  lovastatin (MEVACOR) 40 MG tablet, TAKE 1 TABLET (40 MG TOTAL) BY MOUTH AT BEDTIME., Disp: 90 tablet, Rfl: 1 .  pantoprazole (PROTONIX) 40 MG tablet, Take 40 mg by mouth daily., Disp: , Rfl: 5 .  verapamil (CALAN-SR) 240 MG CR tablet, TAKE 1 TABLET (240 MG TOTAL) BY MOUTH DAILY., Disp: 30 tablet, Rfl: 2 .  Vitamin D, Ergocalciferol, (DRISDOL) 50000 UNITS CAPS capsule, Take 1 capsule (50,000 Units total) by mouth every 7 (seven) days., Disp: 10 capsule, Rfl: 0 .  sucralfate (CARAFATE) 1 GM/10ML suspension, Take 10 mLs (1 g total) by mouth 4 (four) times daily -  with meals and at bedtime., Disp: 420 mL, Rfl: 0  Review of Systems  Review of Systems  Constitutional: Negative for fever, chills, weight loss, malaise/fatigue and diaphoresis.  HENT: Negative for hearing loss, ear pain, nosebleeds, congestion, sore throat, neck pain, tinnitus and ear discharge.   Eyes: Negative for blurred vision, double vision, photophobia, pain, discharge and redness.  Respiratory: Negative for cough, hemoptysis, sputum production, shortness of breath, wheezing and stridor.   Cardiovascular: Negative for chest pain, palpitations, orthopnea, claudication, leg swelling and PND.  Gastrointestinal: negative for abdominal pain. Negative for heartburn, nausea, vomiting, diarrhea, constipation, blood in stool and melena.  Genitourinary: Negative for dysuria, urgency, frequency, hematuria and flank pain.  Musculoskeletal: Negative for myalgias, back pain, joint pain and falls.  Skin: Negative for itching and rash.  Neurological: Negative for dizziness, tingling, tremors, sensory change, speech change, focal weakness, seizures, loss of consciousness, weakness and headaches.  Endo/Heme/Allergies: Negative for environmental allergies and polydipsia. Does not bruise/bleed easily.  Psychiatric/Behavioral: Negative for  depression, suicidal ideas, hallucinations, memory loss and substance abuse. The patient is not nervous/anxious and does not have insomnia.        Objective:  Blood pressure 130/90, pulse 72, height 5' 5" (1.651 m), weight 200 lb (90.719 kg).   Physical Exam  Vitals reviewed. Constitutional: She is oriented to person, place, and time. She appears well-developed and well-nourished.  HENT:  Head: Normocephalic and atraumatic.        Right Ear: External ear normal.  Left Ear: External ear normal.  Nose: Nose normal.  Mouth/Throat: Oropharynx is clear and moist.  Eyes: Conjunctivae and EOM are normal. Pupils are equal, round, and reactive to light. Right eye exhibits no discharge. Left eye exhibits no discharge. No scleral icterus.  Neck: Normal range of motion. Neck supple. No tracheal  deviation present. No thyromegaly present.  Cardiovascular: Normal rate, regular rhythm, normal heart sounds and intact distal pulses.  Exam reveals no gallop and no friction rub.   No murmur heard. Respiratory: Effort normal and breath sounds normal. No respiratory distress. She has no wheezes. She has no rales. She exhibits no tenderness.  GI: Soft. Bowel sounds are normal. She exhibits no distension and no mass. There is no tenderness. There is no rebound and no guarding.  Genitourinary:  Breasts no masses skin changes or nipple changes bilaterally      Vulva is normal without lesions Vagina is pink moist without discharge Cervix normal in appearance and pap is done Uterus is normal size shape and contour Adnexa is negative with normal sized ovaries  Pt sees DR Buford Dresser for her GI care Musculoskeletal: Normal range of motion. She exhibits no edema and no tenderness.  Neurological: She is alert and oriented to person, place, and time. She has normal reflexes. She displays normal reflexes. No cranial nerve deficit. She exhibits normal muscle tone. Coordination normal.  Skin: Skin is warm and dry. No rash  noted. No erythema. No pallor.  Psychiatric: She has a normal mood and affect. Her behavior is normal. Judgment and thought content normal.       Medications Ordered at today's visit: No orders of the defined types were placed in this encounter.    Other orders placed at today's visit: No orders of the defined types were placed in this encounter.      Assessment:    Healthy female exam.    Plan:    Mammogram ordered. Follow up in: 1 year.  2 years  Meds ordered this encounter  Medications  . fluconazole (DIFLUCAN) 100 MG tablet    Sig: Take 1 tablet (100 mg total) by mouth daily.    Dispense:  7 tablet    Refill:  0

## 2015-07-08 LAB — CYTOLOGY - PAP

## 2015-07-12 ENCOUNTER — Telehealth: Payer: Self-pay | Admitting: Endocrinology

## 2015-07-12 MED ORDER — FUROSEMIDE 20 MG PO TABS: 10.0000 mg | ORAL_TABLET | Freq: Every day | ORAL | Status: DC

## 2015-07-12 NOTE — Telephone Encounter (Signed)
Ok, please refill x 6 months.   

## 2015-07-12 NOTE — Telephone Encounter (Signed)
Pt is asking for a refill on furosemide. Last office visit was 09/18/2014. Please advise if ok to refill. Thanks!

## 2015-07-12 NOTE — Telephone Encounter (Signed)
Rx submitted

## 2015-07-12 NOTE — Telephone Encounter (Signed)
[facility] called and said that the Rx for Furosemide was denied and had a message that PT is unknown to prescriber.  He wanted to verify if this is correct information CB#  [phone]

## 2015-07-20 ENCOUNTER — Ambulatory Visit (INDEPENDENT_AMBULATORY_CARE_PROVIDER_SITE_OTHER): Payer: BLUE CROSS/BLUE SHIELD | Admitting: Endocrinology

## 2015-07-20 VITALS — BP 128/80 | HR 74 | Temp 98.6°F | Ht 65.0 in

## 2015-07-20 DIAGNOSIS — E349 Endocrine disorder, unspecified: Secondary | ICD-10-CM | POA: Diagnosis not present

## 2015-07-20 LAB — VITAMIN D 25 HYDROXY (VIT D DEFICIENCY, FRACTURES): VITD: 28.89 ng/mL — ABNORMAL LOW (ref 30.00–100.00)

## 2015-07-20 NOTE — Progress Notes (Signed)
Subjective:    Patient ID: Kaitlin [name], female    DOB: [birth year], 60 y.o.   MRN: [id number]  HPI Pt returns for f/u of hyperparathyroidism: (she appears to have hypercalcemia due to HCTZ, and elev PTH due to vit-D deficiency: these were first noted in 2014; PTH and Ca++ levels returned to normal with ergocalciferol, and discontinuation of HCTZ; she has never had a bony fx; she had resection of a urolith in approx 2000; she has never had bony fracture; DEXA was normal in early 2015; in Sept of 2015, lozol was changed to lasix).  Past Medical History  Diagnosis Date  . BMI 30.0-30.9,adult 2008 182 LBS    2009 194 LBS  . Helicobacter pylori gastritis AUG 2008 PREVPAC: nausea-->HELIDAC: GI UPSET    HB 14.1 NL HFP H. PYLORI STOOL Ag NEG  . Colon polyp APR 2008  . Anxiety   . Essential hypertension, benign   . Hypothyroidism   . Mixed hyperlipidemia   . Irritable bowel syndrome   . Tubulovillous adenoma 08/07/10    Colonoscopy w/ Dr Oneida Alar w/ 7 polyps removed-bx showed tubular adenomas.  Largest 1cm-hepatic flexure polyp->TA.    Marland Kitchen Panic attacks   . Reflux   . Kidney stones   . Depression   . Folliculitis 3/55/7322  . Mixed stress and urge urinary incontinence 09/26/2012  . Polyp of colon, hyperplastic     Past Surgical History  Procedure Laterality Date  . Colonoscopy  APR 2008    AC/[place] SIMPLE ADENOMA  . Upper gastrointestinal endoscopy  AUG 2008 PAIN/NAUSEA    H. PYLORI treated  . Cholecystectomy    . Mass excision  02/28/2011    Procedure: EXCISION MASS;  Surgeon: Donato Heinz, MD;  Location: [facility];  Service: General;  Laterality: Right;  soft tissue mass  . Colonoscopy  08/07/2010    Dr. Raynald Kemp adenomas and tubulovillous adenoma; needs surveillance 2015  . Colonoscopy N/A 09/18/2013    Dr.Rourk- normal rectum, 2 diminutive polys in the mid sigmoid segment o/w the remainder of the colonic mucosa appeared normal.hyperplastic poylps  . Esophagogastroduodenoscopy N/A  11/29/2014    RMR: Gastric erosions. Small hiatal hernia. Status post biopsy.     Social History   Social History  . Marital Status: Married    Spouse Name: N/A  . Number of Children: N/A  . Years of Education: N/A   Occupational History  . Not on file.   Social History Main Topics  . Smoking status: Never Smoker   . Smokeless tobacco: Never Used     Comment: Never Smoked  . Alcohol Use: No  . Drug Use: No  . Sexual Activity: No   Other Topics Concern  . Not on file   Social History Narrative    Current Outpatient Prescriptions on File Prior to Visit  Medication Sig Dispense Refill  . albuterol (PROVENTIL HFA;VENTOLIN HFA) 108 (90 Base) MCG/ACT inhaler Inhale 2 puffs into the lungs every 6 (six) hours as needed for wheezing or shortness of breath. 1 Inhaler 0  . ALPRAZolam (XANAX) 1 MG tablet TAKE 1 TABLET 3 TIMES A DAY 90 tablet 5  . blood glucose meter kit and supplies Dispense based on patient and insurance preference. Test once a day E11.9 1 each 0  . enalapril (VASOTEC) 20 MG tablet Take 1 tablet (20 mg total) by mouth at bedtime. 30 tablet 5  . fluconazole (DIFLUCAN) 100 MG tablet Take 1 tablet (100 mg total) by mouth daily. 7  tablet 0  . fluticasone (FLOVENT HFA) 110 MCG/ACT inhaler Inhale 2 puffs into the lungs 2 (two) times daily. 1 Inhaler 2  . furosemide (LASIX) 20 MG tablet Take 0.5 tablets (10 mg total) by mouth daily. 30 tablet 5  . levothyroxine (SYNTHROID, LEVOTHROID) 50 MCG tablet TAKE 1 TABLET (50 MCG TOTAL) BY MOUTH DAILY. 30 tablet 4  . loratadine (CLARITIN) 10 MG tablet Take 1 tablet (10 mg total) by mouth daily. 30 tablet 0  . lovastatin (MEVACOR) 40 MG tablet TAKE 1 TABLET (40 MG TOTAL) BY MOUTH AT BEDTIME. 90 tablet 1  . pantoprazole (PROTONIX) 40 MG tablet Take 40 mg by mouth daily.  5  . verapamil (CALAN-SR) 240 MG CR tablet TAKE 1 TABLET (240 MG TOTAL) BY MOUTH DAILY. 30 tablet 2  . sucralfate (CARAFATE) 1 GM/10ML suspension Take 10 mLs (1 g total)  by mouth 4 (four) times daily -  with meals and at bedtime. 420 mL 0   No current facility-administered medications on file prior to visit.    Allergies  Allergen Reactions  . Asa [Aspirin] Other (See Comments)    Increases anxiety  . Augmentin [Amoxicillin-Pot Clavulanate] Diarrhea  . Levaquin [Levofloxacin In D5w] Other (See Comments)    chestpain  . Rocephin [Ceftriaxone Sodium In Dextrose] Hives and Other (See Comments)    blisters  . Valtrex [Valacyclovir Hcl] Hives and Other (See Comments)  . Zoloft [Sertraline Hcl] Other (See Comments)    Too strong    Family History  Problem Relation Age of Onset  . Colon cancer Neg Hx   . Anesthesia problems Neg Hx   . Hypotension Neg Hx   . Malignant hyperthermia Neg Hx   . Pseudochol deficiency Neg Hx   . Coronary artery disease Mother   . Hypertension Mother   . Heart attack Mother   . Heart disease Mother   . Coronary artery disease Brother     Age 58  . Heart attack Brother   . Heart disease Brother   . Diabetes Sister   . Diabetes Sister     BP 128/80 mmHg  Pulse 74  Temp(Src) 98.6 F (37 C) (Oral)  Ht '5\' 5"'$  (1.651 m)  SpO2 97%  Review of Systems Denies sob and chest pain    Objective:   Physical Exam VITAL SIGNS:  See vs page GENERAL: no distress Chest wall: no kyphosis Ext: no edema  Lab Results  Component Value Date   PTH 60 06/20/2015   CALCIUM 10.2 06/20/2015   CALCIUM 10.3 06/20/2015  25-OH vit-D=29    Assessment & Plan:  Hyperparathyroidism: resolved.  Vit-D deficiency: mild  Patient is advised the following: Patient Instructions  blood tests are requested for you today.  We'll let you know about the results. I would be happy to see you back here as necessary, especially if your calcium goes significantly high.   addendum: take non-prescription vitamin-D, 1000 units per day.

## 2015-07-20 NOTE — Patient Instructions (Signed)
blood tests are requested for you today.  We'll let you know about the results. I would be happy to see you back here as necessary, especially if your calcium goes significantly high.

## 2015-08-22 ENCOUNTER — Other Ambulatory Visit: Payer: Self-pay | Admitting: Family Medicine

## 2015-08-31 ENCOUNTER — Other Ambulatory Visit: Payer: Self-pay | Admitting: Family Medicine

## 2015-08-31 ENCOUNTER — Encounter: Payer: Self-pay | Admitting: Family Medicine

## 2015-08-31 ENCOUNTER — Ambulatory Visit (INDEPENDENT_AMBULATORY_CARE_PROVIDER_SITE_OTHER): Payer: BLUE CROSS/BLUE SHIELD | Admitting: Family Medicine

## 2015-08-31 VITALS — BP 118/78 | Ht 65.0 in | Wt 204.0 lb

## 2015-08-31 DIAGNOSIS — I10 Essential (primary) hypertension: Secondary | ICD-10-CM | POA: Diagnosis not present

## 2015-08-31 DIAGNOSIS — E119 Type 2 diabetes mellitus without complications: Secondary | ICD-10-CM

## 2015-08-31 NOTE — Progress Notes (Signed)
   Subjective:    Patient ID: Kaitlin [name], female    DOB: June 27, 1955, 60 y.o.   MRN: [id number] Patient arrives office for follow-up of numerous concerns. She's been working hard on her diet. Diabetes She presents for her follow-up diabetic visit. She has type 2 diabetes mellitus. Risk factors for coronary artery disease include diabetes mellitus, dyslipidemia, hypertension and obesity. Current diabetic treatment includes diet. She is compliant with treatment all of the time. Her weight is stable. She is following a diabetic diet. She has had a previous visit with a dietitian. Eye exam current: appt 09/12/15.   Patient states her sugars have been running great-highest reading 149-blood pressure is down and she has been walking a lot.  Fasting numberw overall a lot better  Walking reg  Compliant with blood pressure medicine. No obvious side effects. Watching salt intake.  Review of Systems No headache, no major weight loss or weight gain, no chest pain no back pain abdominal pain no change in bowel habits complete ROS otherwise negative     Objective:   Physical Exam  Alert vital stable. HEENT normal. Lungs clear. Heart regular in rhythm. Ankles without edema      Assessment & Plan:  Impression 1 type 2 diabetes fasting blood sugars much improved. Many questions answered. Patient had numerous questions after seen a dietitian. Was advised by the dietitian that she should be on medication i.e. metformin. Initial A1c was 6.6 and now most fasting sugars in the 100-110 level. Advised patient this is not the case #2 hypertension good control maintain same meds plan 25 minutes spent most in discussion, diet exercise discussed WSL

## 2015-10-10 ENCOUNTER — Other Ambulatory Visit: Payer: Self-pay | Admitting: *Deleted

## 2015-10-10 ENCOUNTER — Telehealth: Payer: Self-pay | Admitting: Family Medicine

## 2015-10-10 MED ORDER — ALPRAZOLAM 1 MG PO TABS: 1.0000 mg | ORAL_TABLET | Freq: Three times a day (TID) | ORAL | 2 refills | Status: DC

## 2015-10-10 NOTE — Telephone Encounter (Signed)
Last seen 08/31/15 for checkup

## 2015-10-10 NOTE — Telephone Encounter (Signed)
Pt is requesting a refill on her ALPRAZolam (XANAX) 1 MG tablet     [facility]

## 2015-10-10 NOTE — Telephone Encounter (Signed)
Okay for this +2 additional refills

## 2015-10-10 NOTE — Telephone Encounter (Signed)
Script faxed. Tried to call no answer to notify pt.

## 2015-10-11 NOTE — Telephone Encounter (Signed)
Pt.notified

## 2015-10-25 ENCOUNTER — Telehealth: Payer: Self-pay | Admitting: Family Medicine

## 2015-10-25 ENCOUNTER — Other Ambulatory Visit: Payer: Self-pay | Admitting: Family Medicine

## 2015-10-25 NOTE — Telephone Encounter (Signed)
Patient says that she doesn't get enough when she asks for a refill, and wants to know why.

## 2015-10-25 NOTE — Telephone Encounter (Signed)
Received this request in the rx request folder from the pharmacy. Will complete today.

## 2015-10-25 NOTE — Telephone Encounter (Signed)
Discussed with patient

## 2015-10-25 NOTE — Telephone Encounter (Signed)
Patient needs Rx for diabetic test strips called in.  Please advise.   [facility]

## 2015-11-02 ENCOUNTER — Encounter: Payer: Self-pay | Admitting: Internal Medicine

## 2015-11-23 ENCOUNTER — Other Ambulatory Visit: Payer: Self-pay | Admitting: Family Medicine

## 2015-12-16 ENCOUNTER — Other Ambulatory Visit: Payer: Self-pay | Admitting: Family Medicine

## 2015-12-16 ENCOUNTER — Ambulatory Visit (INDEPENDENT_AMBULATORY_CARE_PROVIDER_SITE_OTHER): Payer: BLUE CROSS/BLUE SHIELD | Admitting: Family Medicine

## 2015-12-16 ENCOUNTER — Encounter: Payer: Self-pay | Admitting: Family Medicine

## 2015-12-16 VITALS — BP 138/90 | Temp 98.2°F | Ht 65.0 in | Wt 202.1 lb

## 2015-12-16 DIAGNOSIS — A084 Viral intestinal infection, unspecified: Secondary | ICD-10-CM | POA: Diagnosis not present

## 2015-12-16 DIAGNOSIS — R079 Chest pain, unspecified: Secondary | ICD-10-CM

## 2015-12-16 MED ORDER — ONDANSETRON 4 MG PO TBDP: ORAL_TABLET | ORAL | 0 refills | Status: DC

## 2015-12-16 MED ORDER — ETODOLAC 400 MG PO TABS: ORAL_TABLET | ORAL | 0 refills | Status: DC

## 2015-12-16 MED ORDER — CEPHALEXIN 500 MG PO CAPS: ORAL_CAPSULE | ORAL | 0 refills | Status: DC

## 2015-12-16 NOTE — Progress Notes (Signed)
   Subjective:    Patient ID: Kaitlin [name], female    DOB: Apr 04, 1955, 60 y.o.   MRN: [id number]  Chest Pain   This is a new problem. The current episode started in the past 7 days. The pain radiates to the left arm and upper back. Associated symptoms include abdominal pain, a cough and vomiting. Associated symptoms comments: Ear pain.   Started Sunday recurrent vomiting and felt bad  gpot sig nose bleed chest tightness rad to left arm  Pain in neck and isn shoulder blade feeling bad yest felt poorly Chest pain left side of chest is worse with deep breath. Sharp at times. Some radiation into the left arm. No associated shortness of breath. Nausea and vomiting has improved. Some loose stools. No obvious fever next   Patient states no other concerns this visit.  Review of Systems  Respiratory: Positive for cough.   Cardiovascular: Positive for chest pain.  Gastrointestinal: Positive for abdominal pain and vomiting.       Objective:   Physical Exam Alert anxious appearing vital stable lungs clear heart regular rhythm abdominal exam benign. Left sided chest wall tenderness to deep palpation shoulder good range of motion and  EKG normal sinus rhythm no significant ST-T changes       Assessment & Plan:  Impression 1 chest pain highly doubt cardiac etiology discussed at length musculoskeletal likely from substantial vomiting #2 gastroenteritis plan Zofran when necessary. Symptom care discussed. Warning signs discussed. Anti-inflammatory medicine for chest wall and back pain. WSL

## 2015-12-27 ENCOUNTER — Encounter: Payer: Self-pay | Admitting: Internal Medicine

## 2015-12-27 ENCOUNTER — Ambulatory Visit (INDEPENDENT_AMBULATORY_CARE_PROVIDER_SITE_OTHER): Payer: BLUE CROSS/BLUE SHIELD | Admitting: Internal Medicine

## 2015-12-27 VITALS — BP 148/82 | HR 79 | Temp 98.1°F | Ht 65.0 in | Wt 205.2 lb

## 2015-12-27 DIAGNOSIS — K5900 Constipation, unspecified: Secondary | ICD-10-CM

## 2015-12-27 DIAGNOSIS — K219 Gastro-esophageal reflux disease without esophagitis: Secondary | ICD-10-CM | POA: Diagnosis not present

## 2015-12-27 DIAGNOSIS — K6289 Other specified diseases of anus and rectum: Secondary | ICD-10-CM | POA: Diagnosis not present

## 2015-12-27 NOTE — Progress Notes (Signed)
Primary Care Physician:  Mickie Hillier, MD Primary Gastroenterologist:  Dr. Gala Romney  Pre-Procedure History & Physical: HPI:  Kaitlin [name] is a 60 y.o. female here for follow-up GERD and constipation. Both GERD and constipation well controlled on Protonix and lens S, respectively. Note a little perianal pain above her anus recently. No discharge. No bleeding. No trauma. Does not perceive a mass. History of advanced colonic adenoma; due for surveillance colonoscopy 2020.  Past Medical History:  Diagnosis Date  . Anxiety   . BMI 30.0-30.9,adult 2008 182 LBS   2009 194 LBS  . Colon polyp APR 2008  . Depression   . Essential hypertension, benign   . Folliculitis 1/61/0960  . Helicobacter pylori gastritis AUG 2008 PREVPAC: nausea-->HELIDAC: GI UPSET   HB 14.1 NL HFP H. PYLORI STOOL Ag NEG  . Hypothyroidism   . Irritable bowel syndrome   . Kidney stones   . Mixed hyperlipidemia   . Mixed stress and urge urinary incontinence 09/26/2012  . Panic attacks   . Polyp of colon, hyperplastic   . Reflux   . Tubulovillous adenoma 08/07/10   Colonoscopy w/ Dr Oneida Alar w/ 7 polyps removed-bx showed tubular adenomas.  Largest 1cm-hepatic flexure polyp->TA.      Past Surgical History:  Procedure Laterality Date  . CHOLECYSTECTOMY    . COLONOSCOPY  APR 2008   AC/[place] SIMPLE ADENOMA  . COLONOSCOPY  08/07/2010   Dr. Raynald Kemp adenomas and tubulovillous adenoma; needs surveillance 2015  . COLONOSCOPY N/A 09/18/2013   Dr.Alias Villagran- normal rectum, 2 diminutive polys in the mid sigmoid segment o/w the remainder of the colonic mucosa appeared normal.hyperplastic poylps  . ESOPHAGOGASTRODUODENOSCOPY N/A 11/29/2014   RMR: Gastric erosions. Small hiatal hernia. Status post biopsy.   Marland Kitchen MASS EXCISION  02/28/2011   Procedure: EXCISION MASS;  Surgeon: Donato Heinz, MD;  Location: [facility];  Service: General;  Laterality: Right;  soft tissue mass  . UPPER GASTROINTESTINAL ENDOSCOPY  AUG 2008 PAIN/NAUSEA   H.  PYLORI treated    Prior to Admission medications   Medication Sig Start Date End Date Taking? Authorizing Provider  ALPRAZolam Duanne Moron) 1 MG tablet Take 1 tablet (1 mg total) by mouth 3 (three) times daily. 10/10/15  Yes [name], MD  blood glucose meter kit and supplies Dispense based on patient and insurance preference. Test once a day E11.9 07/04/15  Yes [name], MD  enalapril (VASOTEC) 20 MG tablet TAKE 1 TABLET (20 MG TOTAL) BY MOUTH AT BEDTIME. 11/23/15  Yes [name], MD  etodolac (LODINE) 400 MG tablet Take 1 tablet by mouth twice a day with food. 12/16/15  Yes [name], MD  furosemide (LASIX) 20 MG tablet Take 0.5 tablets (10 mg total) by mouth daily. 07/12/15  Yes Renato Shin, MD  levothyroxine (SYNTHROID, LEVOTHROID) 50 MCG tablet TAKE 1 TABLET (50 MCG TOTAL) BY MOUTH DAILY. 07/06/15  Yes [name], MD  lovastatin (MEVACOR) 40 MG tablet TAKE 1 TABLET (40 MG TOTAL) BY MOUTH AT BEDTIME. 12/16/15  Yes [name], MD  ONE [facility] ULTRA TEST test strip USE TO TEST ONCE DAILY 10/25/15  Yes [name], MD  pantoprazole (PROTONIX) 40 MG tablet Take 40 mg by mouth daily. 11/29/14  Yes Historical Provider, MD  verapamil (CALAN-SR) 240 MG CR tablet TAKE 1 TABLET (240 MG TOTAL) BY MOUTH DAILY. 08/31/15  Yes [name], MD  albuterol (PROVENTIL HFA;VENTOLIN HFA) 108 (90 Base) MCG/ACT inhaler Inhale 2 puffs  into the lungs every 6 (six) hours as needed for wheezing or shortness of breath. Patient not taking: Reported on 12/27/2015 06/14/15   Merlyn Albert, MD  cephALEXin (KEFLEX) 500 MG capsule Take 1 tablet by mouth three times a day for 10 days. Patient not taking: Reported on 12/27/2015 12/16/15   Merlyn Albert, MD  fluticasone (FLOVENT HFA) 110 MCG/ACT inhaler Inhale 2 puffs into the lungs 2 (two) times daily. Patient not taking: Reported on 12/27/2015 06/14/15   Merlyn Albert, MD  loratadine (CLARITIN) 10 MG tablet Take 1 tablet (10 mg total) by  mouth daily. Patient not taking: Reported on 12/27/2015 04/24/15   Kristen N Ward, DO  ondansetron (ZOFRAN ODT) 4 MG disintegrating tablet Place 1 tablet under tongue every 6 hours as needed for nausea. Patient not taking: Reported on 12/27/2015 12/16/15   Merlyn Albert, MD  sucralfate (CARAFATE) 1 GM/10ML suspension Take 10 mLs (1 g total) by mouth 4 (four) times daily -  with meals and at bedtime. 12/18/14 12/25/14  Gerhard Munch, MD    Allergies as of 12/27/2015 - Review Complete 12/27/2015  Allergen Reaction Noted  . Asa [aspirin] Other (See Comments) 06/09/2012  . Augmentin [amoxicillin-pot clavulanate] Diarrhea 06/09/2012  . Levaquin [levofloxacin in d5w] Other (See Comments) 07/14/2012  . Rocephin [ceftriaxone sodium in dextrose] Hives and Other (See Comments) 06/09/2012  . Valtrex [valacyclovir hcl] Hives and Other (See Comments) 06/09/2012  . Zoloft [sertraline hcl] Other (See Comments) 06/09/2012    Family History  Problem Relation Age of Onset  . Colon cancer Neg Hx   . Anesthesia problems Neg Hx   . Hypotension Neg Hx   . Malignant hyperthermia Neg Hx   . Pseudochol deficiency Neg Hx   . Coronary artery disease Mother   . Hypertension Mother   . Heart attack Mother   . Heart disease Mother   . Coronary artery disease Brother     Age 74  . Heart attack Brother   . Heart disease Brother   . Diabetes Sister   . Diabetes Sister     Social History   Social History  . Marital status: Married    Spouse name: N/A  . Number of children: N/A  . Years of education: N/A   Occupational History  . Not on file.   Social History Main Topics  . Smoking status: Never Smoker  . Smokeless tobacco: Never Used     Comment: Never Smoked  . Alcohol use No  . Drug use: No  . Sexual activity: No   Other Topics Concern  . Not on file   Social History Narrative  . No narrative on file    Review of Systems: See HPI, otherwise negative ROS  Physical Exam: BP (!)  148/82   Pulse 79   Temp 98.1 F (36.7 C) (Oral)   Ht 5\' 5"  (1.651 m)   Wt 205 lb 3.2 oz (93.1 kg)   BMI 34.15 kg/m  General:   Alert,  Well-developed, well-nourished, pleasant and cooperative in NAD Skin:  Intact without significant lesions or rashes. Eyes:  Sclera clear, no icterus.   Conjunctiva pink. Ears:  Normal auditory acuity. Nose:  No deformity, discharge,  or lesions. Mouth:  No deformity or lesions. Neck:  Supple; no masses or thyromegaly. No significant cervical adenopathy. Lungs:  Clear throughout to auscultation.   No wheezes, crackles, or rhonchi. No acute distress. Heart:  Regular rate and rhythm; no murmurs, clicks, rubs,  or  gallops. Abdomen: Non-distended, normal bowel sounds.  Soft and nontender without appreciable mass or hepatosplenomegaly.  Pulses:  Normal pulses noted. Extremities:  Without clubbing or edema. Rectal:  No external lesions. Soft tissue just above the anus at the 12:00 position little tender. I do not feel any fluctuance or mass. Digital exam no mass in the rectal vault; scant brown stool Hemoccult negative   Impression:   Pleasant 60 year old lady with a history of advanced adenoma; due for surveillance colonoscopy 2020. GERD symptoms well controlled on Protonix 40 mg daily. Constipation well-managed on lenses.  Perianal pain nonspecific. I doubt fissure or early perirectal abscess but those 2 things would remain in the differential.  I do not see anything needs to be specifically treated this time  Recommendations:   Sitz baths as an instructed for perianal discomfort  Continue Protonix 40 mg daily  Continue Linzess 145 daily  Office visit in 6 months  If symptoms do not improve the next couple weeks, lease let me know.       Notice: This dictation was prepared with Dragon dictation along with smaller phrase technology. Any transcriptional errors that result from this process are unintentional and may not be corrected upon  review.

## 2015-12-27 NOTE — Patient Instructions (Addendum)
Sitz baths as an instructed for perianal discomfort  Continue Protonix 40 mg daily  Continue Linzess 145 daily  Office visit in 6 months   Surveillance colonoscopy 2020  If symptoms do not improve the next couple weeks, lease let me know.

## 2015-12-27 NOTE — Progress Notes (Signed)
Primary Care Physician:  Mickie Hillier, MD Primary Gastroenterologist:  Dr. Gala Romney  Pre-Procedure History & Physical: HPI:  Kaitlin [name] is a 60 y.o. female here for follow-up of GERD and constipation. The symptoms much better with PPI and Linzeszs. Patient experienced an episode of nausea and vomiting 2 weeks ago for which she states she saw Dr. Moshe Cipro. Self-limiting. Only complaint now is some discomfort just above her anal canal externally. Hasn't had any discharge or bleeding or trauma. Wants me to take a look at it.  Past Medical History:  Diagnosis Date  . Anxiety   . BMI 30.0-30.9,adult 2008 182 LBS   2009 194 LBS  . Colon polyp APR 2008  . Depression   . Essential hypertension, benign   . Folliculitis 1/61/0960  . Helicobacter pylori gastritis AUG 2008 PREVPAC: nausea-->HELIDAC: GI UPSET   HB 14.1 NL HFP H. PYLORI STOOL Ag NEG  . Hypothyroidism   . Irritable bowel syndrome   . Kidney stones   . Mixed hyperlipidemia   . Mixed stress and urge urinary incontinence 09/26/2012  . Panic attacks   . Polyp of colon, hyperplastic   . Reflux   . Tubulovillous adenoma 08/07/10   Colonoscopy w/ Dr Oneida Alar w/ 7 polyps removed-bx showed tubular adenomas.  Largest 1cm-hepatic flexure polyp->TA.      Past Surgical History:  Procedure Laterality Date  . CHOLECYSTECTOMY    . COLONOSCOPY  APR 2008   [facility]/[place] SIMPLE ADENOMA  . COLONOSCOPY  08/07/2010   Dr. Raynald Kemp adenomas and tubulovillous adenoma; needs surveillance 2015  . COLONOSCOPY N/A 09/18/2013   Dr.Venna Berberich- normal rectum, 2 diminutive polys in the mid sigmoid segment o/w the remainder of the colonic mucosa appeared normal.hyperplastic poylps  . ESOPHAGOGASTRODUODENOSCOPY N/A 11/29/2014   RMR: Gastric erosions. Small hiatal hernia. Status post biopsy.   Marland Kitchen MASS EXCISION  02/28/2011   Procedure: EXCISION MASS;  Surgeon: Donato Heinz, MD;  Location: [facility];  Service: General;  Laterality: Right;  soft tissue mass  . UPPER  GASTROINTESTINAL ENDOSCOPY  AUG 2008 PAIN/NAUSEA   H. PYLORI treated    Prior to Admission medications   Medication Sig Start Date End Date Taking? Authorizing Provider  ALPRAZolam Duanne Moron) 1 MG tablet Take 1 tablet (1 mg total) by mouth 3 (three) times daily. 10/10/15  Yes [name], MD  blood glucose meter kit and supplies Dispense based on patient and insurance preference. Test once a day E11.9 07/04/15  Yes [name], MD  enalapril (VASOTEC) 20 MG tablet TAKE 1 TABLET (20 MG TOTAL) BY MOUTH AT BEDTIME. 11/23/15  Yes [name], MD  etodolac (LODINE) 400 MG tablet Take 1 tablet by mouth twice a day with food. 12/16/15  Yes [name], MD  furosemide (LASIX) 20 MG tablet Take 0.5 tablets (10 mg total) by mouth daily. 07/12/15  Yes Renato Shin, MD  levothyroxine (SYNTHROID, LEVOTHROID) 50 MCG tablet TAKE 1 TABLET (50 MCG TOTAL) BY MOUTH DAILY. 07/06/15  Yes [name], MD  lovastatin (MEVACOR) 40 MG tablet TAKE 1 TABLET (40 MG TOTAL) BY MOUTH AT BEDTIME. 12/16/15  Yes [name], MD  ONE [facility] ULTRA TEST test strip USE TO TEST ONCE DAILY 10/25/15  Yes [name], MD  pantoprazole (PROTONIX) 40 MG tablet Take 40 mg by mouth daily. 11/29/14  Yes Historical Provider, MD  verapamil (CALAN-SR) 240 MG CR tablet TAKE 1 TABLET (240 MG TOTAL) BY MOUTH DAILY. 08/31/15  Yes Grace Bushy  Luking, MD  albuterol (PROVENTIL HFA;VENTOLIN HFA) 108 (90 Base) MCG/ACT inhaler Inhale 2 puffs into the lungs every 6 (six) hours as needed for wheezing or shortness of breath. Patient not taking: Reported on 12/27/2015 06/14/15   [name], MD  cephALEXin (KEFLEX) 500 MG capsule Take 1 tablet by mouth three times a day for 10 days. Patient not taking: Reported on 12/27/2015 12/16/15   [name], MD  fluticasone (FLOVENT HFA) 110 MCG/ACT inhaler Inhale 2 puffs into the lungs 2 (two) times daily. Patient not taking: Reported on 12/27/2015 06/14/15   [name], MD  loratadine  (CLARITIN) 10 MG tablet Take 1 tablet (10 mg total) by mouth daily. Patient not taking: Reported on 12/27/2015 04/24/15   Kristen N Ward, DO  ondansetron (ZOFRAN ODT) 4 MG disintegrating tablet Place 1 tablet under tongue every 6 hours as needed for nausea. Patient not taking: Reported on 12/27/2015 12/16/15   [name], MD  sucralfate (CARAFATE) 1 GM/10ML suspension Take 10 mLs (1 g total) by mouth 4 (four) times daily -  with meals and at bedtime. 12/18/14 12/25/14  Carmin Muskrat, MD    Allergies as of 12/27/2015 - Review Complete 12/27/2015  Allergen Reaction Noted  . Asa [aspirin] Other (See Comments) 06/09/2012  . Augmentin [amoxicillin-pot clavulanate] Diarrhea 06/09/2012  . Levaquin [levofloxacin in d5w] Other (See Comments) 07/14/2012  . Rocephin [ceftriaxone sodium in dextrose] Hives and Other (See Comments) 06/09/2012  . Valtrex [valacyclovir hcl] Hives and Other (See Comments) 06/09/2012  . Zoloft [sertraline hcl] Other (See Comments) 06/09/2012    Family History  Problem Relation Age of Onset  . Colon cancer Neg Hx   . Anesthesia problems Neg Hx   . Hypotension Neg Hx   . Malignant hyperthermia Neg Hx   . Pseudochol deficiency Neg Hx   . Coronary artery disease Mother   . Hypertension Mother   . Heart attack Mother   . Heart disease Mother   . Coronary artery disease Brother     Age 56  . Heart attack Brother   . Heart disease Brother   . Diabetes Sister   . Diabetes Sister     Social History   Social History  . Marital status: Married    Spouse name: N/A  . Number of children: N/A  . Years of education: N/A   Occupational History  . Not on file.   Social History Main Topics  . Smoking status: Never Smoker  . Smokeless tobacco: Never Used     Comment: Never Smoked  . Alcohol use No  . Drug use: No  . Sexual activity: No   Other Topics Concern  . Not on file   Social History Narrative  . No narrative on file    Review of Systems: See  HPI, otherwise negative ROS  Physical Exam: BP (!) 148/82   Pulse 79   Temp 98.1 F (36.7 C) (Oral)   Ht '5\' 5"'$  (1.651 m)   Wt 205 lb 3.2 oz (93.1 kg)   BMI 34.15 kg/m  General:   Alert,  Well-developed, well-nourished, pleasant and cooperative in NAD Skin:  Intact without significant lesions or rashes. Eyes:  Sclera clear, no icterus.   Conjunctiva pink. Ears:  Normal auditory acuity. Nose:  No deformity, discharge,  or lesions. Mouth:  No deformity or lesions. Neck:  Supple; no masses or thyromegaly. No significant cervical adenopathy. Lungs:  Clear throughout to auscultation.   No wheezes, crackles, or rhonchi.  No acute distress. Heart:  Regular rate and rhythm; no murmurs, clicks, rubs,  or gallops. Abdomen: Non-distended, normal bowel sounds.  Soft and nontender without appreciable mass or hepatosplenomegaly.  Pulses:  Normal pulses noted. Extremities:  Without clubbing or edema. Rectal/perianal exam:  No external lesions. Some tenderness to palpation at the 12:00 position. No fluctuance and no discharge.  Digital exam not particularly tender good sphincter tone and no mass to rectal vault scant stools Hemoccult negative.  Impression:  Self-limiting episode of nausea and vomiting couple of weeks ago. Now resolved. Constipation doing well on Linzess. GERD well-controlled on Protonix 40 mg daily History of advanced colonic adenoma- due for surveillance colonoscopy 2020. Perianal pain non-specific. May have an element of coccygeal pain. I did not detect an abscess or fissure at this time.    Recommendations:  Sitz baths as an instructed for perianal discomfort  Continue Protonix 40 mg daily  Continue Linzess 145 daily  Office visit in 6 months   Surveillance colonoscopy 2020  If symptoms do not improve the next couple weeks, lease let me know.    Notice: This dictation was prepared with Dragon dictation along with smaller phrase technology. Any transcriptional errors  that result from this process are unintentional and may not be corrected upon review.

## 2015-12-28 ENCOUNTER — Telehealth: Payer: Self-pay | Admitting: *Deleted

## 2015-12-28 ENCOUNTER — Ambulatory Visit (INDEPENDENT_AMBULATORY_CARE_PROVIDER_SITE_OTHER): Payer: BLUE CROSS/BLUE SHIELD | Admitting: Family Medicine

## 2015-12-28 ENCOUNTER — Encounter: Payer: Self-pay | Admitting: Family Medicine

## 2015-12-28 VITALS — BP 122/74 | Ht 65.0 in | Wt 205.0 lb

## 2015-12-28 DIAGNOSIS — E039 Hypothyroidism, unspecified: Secondary | ICD-10-CM | POA: Diagnosis not present

## 2015-12-28 DIAGNOSIS — E119 Type 2 diabetes mellitus without complications: Secondary | ICD-10-CM | POA: Diagnosis not present

## 2015-12-28 DIAGNOSIS — E7800 Pure hypercholesterolemia, unspecified: Secondary | ICD-10-CM | POA: Diagnosis not present

## 2015-12-28 DIAGNOSIS — Z79899 Other long term (current) drug therapy: Secondary | ICD-10-CM

## 2015-12-28 DIAGNOSIS — R0789 Other chest pain: Secondary | ICD-10-CM | POA: Diagnosis not present

## 2015-12-28 DIAGNOSIS — I10 Essential (primary) hypertension: Secondary | ICD-10-CM

## 2015-12-28 LAB — POCT GLYCOSYLATED HEMOGLOBIN (HGB A1C): Hemoglobin A1C: 6.6

## 2015-12-28 MED ORDER — ENALAPRIL MALEATE 20 MG PO TABS: 20.0000 mg | ORAL_TABLET | Freq: Every day | ORAL | 5 refills | Status: DC

## 2015-12-28 MED ORDER — VERAPAMIL HCL ER 240 MG PO TBCR: 240.0000 mg | EXTENDED_RELEASE_TABLET | Freq: Every day | ORAL | 5 refills | Status: DC

## 2015-12-28 MED ORDER — FUROSEMIDE 20 MG PO TABS: 10.0000 mg | ORAL_TABLET | Freq: Every day | ORAL | 5 refills | Status: DC

## 2015-12-28 MED ORDER — LEVOTHYROXINE SODIUM 50 MCG PO TABS: ORAL_TABLET | ORAL | 5 refills | Status: DC

## 2015-12-28 MED ORDER — ALPRAZOLAM 1 MG PO TABS: 1.0000 mg | ORAL_TABLET | Freq: Three times a day (TID) | ORAL | 2 refills | Status: DC

## 2015-12-28 MED ORDER — LORATADINE 10 MG PO TABS: 10.0000 mg | ORAL_TABLET | Freq: Every day | ORAL | 5 refills | Status: DC

## 2015-12-28 NOTE — Progress Notes (Signed)
   Subjective:    Patient ID: Kaitlin [name], female    DOB: [birth year], 60 y.o.   MRN: [id number]  Diabetes  She presents for her follow-up diabetic visit. She has type 2 diabetes mellitus. Current diabetic treatment includes diet. She is following a diabetic diet. Exercise: walking.  A1C 6.6 Patient claims compliance with diabetes medication. No obvious side effects. Reports no substantial low sugar spells. Most numbers are generally in good range when checked fasting. Generally does not miss a dose of medication. Watching diabetic diet closely  Blood pressure medicine and blood pressure levels reviewed today with patient. Compliant with blood pressure medicine. States does not miss a dose. No obvious side effects. Blood pressure generally good when checked elsewhere. Watching salt intake.  Patient claims compliance with thyroid medicine no symptoms of fatigue  Still having chest pain and pain in left arm. Concerned about this. Has not seen a heart doctor for long time. Scared that she has heart disease. Pain is generally not exertional  Results for orders placed or performed in visit on 12/28/15  POCT glycosylated hemoglobin (Hb A1C)  Result Value Ref Range   Hemoglobin A1C 6.6     No cp with exrcise  Cp flares up more after reflux, Just saw a GI doctor yesterday who had not much more to add to her current m Needs b w today also     Review of Systems No headache, no major weight loss or weight gain, no chest pain no back pain abdominal pain no change in bowel habits complete ROS otherwise negative     Objective:   Physical Exam Alert vitals stable, NAD. Blood pressure good on repeat. HEENT normal. Lungs clear. Heart regular rate and rhythm.        Assessment & Plan:  Impression #1 atypical chest pain. doubt cardiac etiology however patient very scared about this and she does have some risk factors Car d referral #2 hypertension good control discussed maintain same meds  #3 type 2 diabetes good control discussed maintain same meds #4 hyperlipidemia status uncertain discuss plan appropriate blood work. Cardiology referral. Diet exercise discussed. Medications refilled. Recheck in 6 months WSL

## 2015-12-28 NOTE — Telephone Encounter (Signed)
Specialist was fro cardiologist---not endocrinologist, we have already initated

## 2015-12-28 NOTE — Telephone Encounter (Signed)
Lets wait til blood test comes bk , if normal doew not need to see an endocrin right now

## 2015-12-28 NOTE — Telephone Encounter (Signed)
Patient wants referral to new endocrinologist-she doesn't want to see Dr Loanne Drilling anymore and also didn't like the one in [place].

## 2015-12-28 NOTE — Telephone Encounter (Signed)
Pt seen today. Wanted tsh and pth ordered. Pt was referred to specialist. Pt states she does not like Dr. Arnoldo Lenis or the one in [place]. Pt wants a new referral to someone different. Orders put in for tsh and pth. Pt states she was never told how often specialist wanted to check thyroid.

## 2015-12-29 NOTE — Telephone Encounter (Signed)
Advised patient that Dr Richardson Landry wants to wait on results of Blood work. If results are normal she will not need to see endocrinologist. Patient verbalized understanding.

## 2015-12-30 ENCOUNTER — Ambulatory Visit: Payer: BLUE CROSS/BLUE SHIELD | Admitting: Family Medicine

## 2016-01-09 ENCOUNTER — Telehealth: Payer: Self-pay | Admitting: Family Medicine

## 2016-01-09 NOTE — Telephone Encounter (Signed)
Left message on voicemail to return call.

## 2016-01-09 NOTE — Telephone Encounter (Signed)
Patient just wanted you to know she has high anxiety because her mom had a stroke and cousin dead of heart attack this weekend.

## 2016-01-09 NOTE — Telephone Encounter (Signed)
Ntsw, spend some time with her on the phone and let her know we are sorry about her family and all of the stress

## 2016-01-19 ENCOUNTER — Telehealth: Payer: Self-pay | Admitting: Family Medicine

## 2016-01-19 NOTE — Telephone Encounter (Signed)
Pt called stating that her stress level has been high lately and has been having nose bleeds. Pt was checked at the wellness center at [facility] [employer] where she works and her bp was 155/88. They suggested that she touch base with her dr to see what he thinks.

## 2016-01-19 NOTE — Telephone Encounter (Signed)
Nurse's-please call patient. Make sure she is stable for today. She should follow-up on Friday with Dr. Richardson Landry regarding nosebleed and elevated blood pressure

## 2016-01-19 NOTE — Telephone Encounter (Signed)
Spoke with patient to discuss patient's symptoms. Patient stated that she has not had any more nose bleeds and feels ok. States that she would like to just give [name] a call tomorrow if nose bleeds resume again versus scheduling today. States if no she starts feeling worst or symptoms reoccur she will notify us.

## 2016-01-24 ENCOUNTER — Ambulatory Visit (INDEPENDENT_AMBULATORY_CARE_PROVIDER_SITE_OTHER): Payer: BLUE CROSS/BLUE SHIELD | Admitting: Cardiology

## 2016-01-24 ENCOUNTER — Encounter: Payer: Self-pay | Admitting: Cardiology

## 2016-01-24 VITALS — BP 125/81 | HR 73 | Ht 65.0 in | Wt 202.2 lb

## 2016-01-24 DIAGNOSIS — I10 Essential (primary) hypertension: Secondary | ICD-10-CM | POA: Diagnosis not present

## 2016-01-24 DIAGNOSIS — R0789 Other chest pain: Secondary | ICD-10-CM

## 2016-01-24 DIAGNOSIS — E782 Mixed hyperlipidemia: Secondary | ICD-10-CM

## 2016-01-24 DIAGNOSIS — R002 Palpitations: Secondary | ICD-10-CM | POA: Diagnosis not present

## 2016-01-24 NOTE — Progress Notes (Signed)
Clinical Summary Kaitlin [name] is a 60 y.o.female seen today for follow up of the following medical problems.   1. Chest pain - long history of chest pain - seen by Dr Domenic Polite in 2012 for chest pain. Echo at that time without significant pathology. - 12/2009 Lexiscan nuclear stress no ishcemia - since that time continues to have intermittent chest pain described as a left sided pain radiating into midchest, 4/10 in severity. Could occur at rest or with exertion. Feel diaphoretic. Better with xanax, would last few minutes.  - more recently she reports a different more severe aching in her chest. This started a few weeks ago, and primarily occurred during 2 different episodes of significant nausea/vomiting/retching. Since her vomiting has resolved, this chest pain is resolving. Denies any SOB or DOE, no exertional symptoms.    - chest pain still ongoing. Reports episode a few weeks ago of nausea and vomiting. Radiated down into left arm. Sharp like pain midchest and under left breast, into left arm. 7/10 in severity. Not positional. Constant for several hours. Not better with antacids.  Has had mild symptoms since then. No reccurent N/V. No exertional chest pain. No SOB or DOE. Constant pain x 7 days.  - aspirin causes anxiety.    2. GERD - followed by Dr Gala Romney   3. Palpitations - occasional palpitations with anxiety. Fairly infrequent.   4. 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  Past Medical History:  Diagnosis Date  . Anxiety   . BMI 30.0-30.9,adult 2008 182 LBS   2009 194 LBS  . Colon polyp APR 2008  . Depression   . Essential hypertension, benign   . Folliculitis 2/44/0102  . Helicobacter pylori gastritis AUG 2008 PREVPAC: nausea-->HELIDAC: GI UPSET   HB 14.1 NL HFP H. PYLORI STOOL Ag NEG  . Hypothyroidism   . Irritable bowel syndrome   . Kidney stones   . Mixed  hyperlipidemia   . Mixed stress and urge urinary incontinence 09/26/2012  . Panic attacks   . Polyp of colon, hyperplastic   . Reflux   . Tubulovillous adenoma 08/07/10   Colonoscopy w/ Dr Oneida Alar w/ 7 polyps removed-bx showed tubular adenomas.  Largest 1cm-hepatic flexure polyp->TA.       Allergies  Allergen Reactions  . Asa [Aspirin] Other (See Comments)    Increases anxiety  . Augmentin [Amoxicillin-Pot Clavulanate] Diarrhea  . Levaquin [Levofloxacin In D5w] Other (See Comments)    chestpain  . Rocephin [Ceftriaxone Sodium In Dextrose] Hives and Other (See Comments)    blisters  . Valtrex [Valacyclovir Hcl] Hives and Other (See Comments)  . Zoloft [Sertraline Hcl] Other (See Comments)    Too strong     Current Outpatient Prescriptions  Medication Sig Dispense Refill  . ALPRAZolam (XANAX) 1 MG tablet Take 1 tablet (1 mg total) by mouth 3 (three) times daily. 90 tablet 2  . blood glucose meter kit and supplies Dispense based on patient and insurance preference. Test once a day E11.9 1 each 0  . enalapril (VASOTEC) 20 MG tablet Take 1 tablet (20 mg total) by mouth at bedtime. 30 tablet 5  . etodolac (LODINE) 400 MG tablet Take 1 tablet by mouth twice a day with food. 24 tablet 0  . furosemide (LASIX) 20 MG tablet Take 0.5 tablets (10 mg total) by mouth daily. 30 tablet 5  . levothyroxine (  SYNTHROID, LEVOTHROID) 50 MCG tablet TAKE 1 TABLET (50 MCG TOTAL) BY MOUTH DAILY. 30 tablet 5  . loratadine (CLARITIN) 10 MG tablet Take 1 tablet (10 mg total) by mouth daily. 30 tablet 5  . lovastatin (MEVACOR) 40 MG tablet TAKE 1 TABLET (40 MG TOTAL) BY MOUTH AT BEDTIME. 90 tablet 1  . ONE TOUCH ULTRA TEST test strip USE TO TEST ONCE DAILY 50 each 5  . pantoprazole (PROTONIX) 40 MG tablet Take 40 mg by mouth daily.  5  . verapamil (CALAN-SR) 240 MG CR tablet Take 1 tablet (240 mg total) by mouth daily. 30 tablet 5   No current facility-administered medications for this visit.      Past  Surgical History:  Procedure Laterality Date  . CHOLECYSTECTOMY    . COLONOSCOPY  APR 2008   AC/[place] SIMPLE ADENOMA  . COLONOSCOPY  08/07/2010   Dr. Raynald Kemp adenomas and tubulovillous adenoma; needs surveillance 2015  . COLONOSCOPY N/A 09/18/2013   Dr.Rourk- normal rectum, 2 diminutive polys in the mid sigmoid segment o/w the remainder of the colonic mucosa appeared normal.hyperplastic poylps  . ESOPHAGOGASTRODUODENOSCOPY N/A 11/29/2014   RMR: Gastric erosions. Small hiatal hernia. Status post biopsy.   Marland Kitchen MASS EXCISION  02/28/2011   Procedure: EXCISION MASS;  Surgeon: Donato Heinz, MD;  Location: [facility];  Service: General;  Laterality: Right;  soft tissue mass  . UPPER GASTROINTESTINAL ENDOSCOPY  AUG 2008 PAIN/NAUSEA   H. PYLORI treated     Allergies  Allergen Reactions  . Asa [Aspirin] Other (See Comments)    Increases anxiety  . Augmentin [Amoxicillin-Pot Clavulanate] Diarrhea  . Levaquin [Levofloxacin In D5w] Other (See Comments)    chestpain  . Rocephin [Ceftriaxone Sodium In Dextrose] Hives and Other (See Comments)    blisters  . Valtrex [Valacyclovir Hcl] Hives and Other (See Comments)  . Zoloft [Sertraline Hcl] Other (See Comments)    Too strong      Family History  Problem Relation Age of Onset  . Colon cancer Neg Hx   . Anesthesia problems Neg Hx   . Hypotension Neg Hx   . Malignant hyperthermia Neg Hx   . Pseudochol deficiency Neg Hx   . Coronary artery disease Mother   . Hypertension Mother   . Heart attack Mother   . Heart disease Mother   . Coronary artery disease Brother     Age 21  . Heart attack Brother   . Heart disease Brother   . Diabetes Sister   . Diabetes Sister      Social History Kaitlin [name] reports that she has never smoked. She has never used smokeless tobacco. Kaitlin [name] reports that she does not drink alcohol.   Review of Systems CONSTITUTIONAL: No weight loss, fever, chills, weakness or fatigue.  HEENT: Eyes: No visual loss,  blurred vision, double vision or yellow sclerae.No hearing loss, sneezing, congestion, runny nose or sore throat.  SKIN: No rash or itching.  CARDIOVASCULAR: per HPI RESPIRATORY: No shortness of breath, cough or sputum.  GASTROINTESTINAL: per HPI GENITOURINARY: No burning on urination, no polyuria NEUROLOGICAL: No headache, dizziness, syncope, paralysis, ataxia, numbness or tingling in the extremities. No change in bowel or bladder control.  MUSCULOSKELETAL: No muscle, back pain, joint pain or stiffness.  LYMPHATICS: No enlarged nodes. No history of splenectomy.  PSYCHIATRIC: No history of depression or anxiety.  ENDOCRINOLOGIC: No reports of sweating, cold or heat intolerance. No polyuria or polydipsia.  Marland Kitchen   Physical Examination Vitals:   01/24/16  1414  BP: 125/81  Pulse: 73   Vitals:   01/24/16 1414  Weight: 202 lb 3.2 oz (91.7 kg)  Height: '5\' 5"'$  (1.651 m)    Gen: resting comfortably, no acute distress HEENT: no scleral icterus, pupils equal round and reactive, no palptable cervical adenopathy,  CV: RRR, no mr/g, no jvd Resp: Clear to auscultation bilaterally GI: abdomen is soft, non-tender, non-distended, normal bowel sounds, no hepatosplenomegaly MSK: extremities are warm, no edema.  Skin: warm, no rash Neuro:  no focal deficits Psych: appropriate affect   Diagnostic Studies  12/2010 echo Study Conclusions  - Left ventricle: The cavity size was normal. Mild septal hypertrophy. Systolic function was normal. The estimated ejection fraction was in the range of 60% to 65%. Wall motion was normal; there were no regional wall motion abnormalities. - Aortic valve: Mildly calcified annulus. - Mitral valve: Calcified annulus. - Atrial septum: No defect or patent foramen ovale was identified.   03/02/15 EKG (I reviewed independently in clinic today) NSR   Assessment and Plan  1. Chest pain - long history of chronic atypical chest pain with negative  stress test in 2011. EKG in clinic without new ischemic changes - recent symptoms have mainly been triggered by N/V. Symptoms are not cardiac in description - would not repeat ischemic testing at this time, continue to monitor.   2. Palpitatoins - fairly mild symptoms, continue to monitor at this time  3. Hyperlipidemia - at goal, continue statin  4. HTN - at goal, continue current meds  F/u 6 months   Arnoldo Lenis, M.D.

## 2016-01-24 NOTE — Patient Instructions (Signed)

## 2016-02-15 ENCOUNTER — Ambulatory Visit (INDEPENDENT_AMBULATORY_CARE_PROVIDER_SITE_OTHER): Payer: BLUE CROSS/BLUE SHIELD | Admitting: Family Medicine

## 2016-02-15 ENCOUNTER — Encounter: Payer: Self-pay | Admitting: Family Medicine

## 2016-02-15 VITALS — BP 130/80 | Ht 65.0 in | Wt 200.4 lb

## 2016-02-15 DIAGNOSIS — R079 Chest pain, unspecified: Secondary | ICD-10-CM | POA: Diagnosis not present

## 2016-02-15 NOTE — Progress Notes (Signed)
   Subjective:    Patient ID: Kaitlin [name], female    DOB: 01/16/1956, 60 y.o.   MRN: [id number]  HPI  Patient arrives with c/o on ongoing chest pain.  Chest pains left-sided. Occurs at rest or with exertion. Some time #times achy. Seems to radiate to left arm. Patient does admit to worsened somewhat after meals. She also has been taking more anti-inflammatory medications lately. Next  Saw the cardiologist just a couple weeks ago who felt her pain was not cardiac.  Continues to work in [employer] in [employer]. Next  Working hard but not exercising much. Next  Nonsmoker    Review of Systems No headache, no major weight loss or weight gain, no chest pain no back pain abdominal pain no change in bowel habits complete ROS otherwise negative     Objective:   Physical Exam Alert vitals stable, NAD. Blood pressure good on repeat. HEENT normal. Lungs clear. Heart regular rate and rhythm. Left anterior chest wall tenderness to deep palpation. Some epigastric tenderness to deep palpation left shoulder good range of motion distal strength sensation intact   EKG normal sinus rhythm no significant ST-T changes    Assessment & Plan:  Impression likely multifaceted [place] etiologies to patient's diffuse symptoms. Likely musculoskeletal arm pain. Likely flare of reflux with element of gastritis. Likely chest wall pain very unlikely cardiac etiology discussed at great length patient comfortable with holding off on another major workup at this time plan increase Protonix every single day. Cut down anti-inflammatory medication use. Diet exercise discussed in encourage

## 2016-02-20 ENCOUNTER — Encounter: Payer: Self-pay | Admitting: Internal Medicine

## 2016-02-20 ENCOUNTER — Ambulatory Visit (INDEPENDENT_AMBULATORY_CARE_PROVIDER_SITE_OTHER): Payer: BLUE CROSS/BLUE SHIELD | Admitting: Gastroenterology

## 2016-02-20 ENCOUNTER — Encounter: Payer: Self-pay | Admitting: Gastroenterology

## 2016-02-20 ENCOUNTER — Other Ambulatory Visit: Payer: Self-pay | Admitting: Family Medicine

## 2016-02-20 ENCOUNTER — Encounter: Payer: Self-pay | Admitting: Family Medicine

## 2016-02-20 VITALS — BP 138/76 | HR 76 | Temp 97.6°F | Ht 65.0 in | Wt 201.0 lb

## 2016-02-20 DIAGNOSIS — R1013 Epigastric pain: Secondary | ICD-10-CM | POA: Insufficient documentation

## 2016-02-20 DIAGNOSIS — I10 Essential (primary) hypertension: Secondary | ICD-10-CM | POA: Diagnosis not present

## 2016-02-20 DIAGNOSIS — K219 Gastro-esophageal reflux disease without esophagitis: Secondary | ICD-10-CM | POA: Diagnosis not present

## 2016-02-20 DIAGNOSIS — E7800 Pure hypercholesterolemia, unspecified: Secondary | ICD-10-CM | POA: Diagnosis not present

## 2016-02-20 DIAGNOSIS — K5900 Constipation, unspecified: Secondary | ICD-10-CM | POA: Diagnosis not present

## 2016-02-20 DIAGNOSIS — E039 Hypothyroidism, unspecified: Secondary | ICD-10-CM | POA: Diagnosis not present

## 2016-02-20 DIAGNOSIS — E119 Type 2 diabetes mellitus without complications: Secondary | ICD-10-CM | POA: Diagnosis not present

## 2016-02-20 MED ORDER — LINACLOTIDE 145 MCG PO CAPS: 145.0000 ug | ORAL_CAPSULE | Freq: Every day | ORAL | 5 refills | Status: DC

## 2016-02-20 MED ORDER — SUCRALFATE 1 GM/10ML PO SUSP: 1.0000 g | Freq: Three times a day (TID) | ORAL | 0 refills | Status: DC

## 2016-02-20 NOTE — Patient Instructions (Signed)
1. Start back on Linzess 1100mcg daily to every other day for constipation.  2. Stop pantoprazole for now. Take Dexilant once daily 30 minutes before breakfast for next two weeks. Resume pantoprazole once complete Dexilant.  3. Carafate four times daily for abdominal burning, take before meals and at bedtime.  4. Please have your labs done today.

## 2016-02-20 NOTE — Progress Notes (Signed)
Primary Care Physician: Mickie Hillier, MD  Primary Gastroenterologist:  Garfield Cornea, MD   Chief Complaint  Patient presents with  . Gastroesophageal Reflux    having chills (no fever), had several episodes past 3 weeks, felt like having heart attack-had EKG (PCP-having spasms in esophagus)  . Bloated    HPI: Kaitlin [name] is a 60 y.o. female here for urgent f/u visit. She has h/o GERD, constipation. Last seen in 12/2015.  Three weeks of severe chest pain and into back and left arm. 3 EKGs Per patient unremarkable and two PCP visits, and one with cardiologist. A lot regurgitation, episodes of vomiting. Bloating. protonix daily just recently since symptoms started. Has not really been on Protonix on a regular basis. No other PPIs. Usually doesn't take every day. LInzess 290 too strong, then 145. Not taking daily and doesn't work well like that. No melena, brbpr. Upper GI symptoms related to meals. Nonexertional.  No dysphagia. No abdominal pain but complains of burning quality in the epigastrium.   Current Outpatient Prescriptions  Medication Sig Dispense Refill  . ALPRAZolam (XANAX) 1 MG tablet Take 1 tablet (1 mg total) by mouth 3 (three) times daily. 90 tablet 2  . blood glucose meter kit and supplies Dispense based on patient and insurance preference. Test once a day E11.9 1 each 0  . enalapril (VASOTEC) 20 MG tablet Take 1 tablet (20 mg total) by mouth at bedtime. 30 tablet 5  . furosemide (LASIX) 20 MG tablet Take 0.5 tablets (10 mg total) by mouth daily. 30 tablet 5  . levothyroxine (SYNTHROID, LEVOTHROID) 50 MCG tablet TAKE 1 TABLET (50 MCG TOTAL) BY MOUTH DAILY. 30 tablet 5  . lovastatin (MEVACOR) 40 MG tablet TAKE 1 TABLET (40 MG TOTAL) BY MOUTH AT BEDTIME. 90 tablet 1  . ONE TOUCH ULTRA TEST test strip USE TO TEST ONCE DAILY 50 each 5  . pantoprazole (PROTONIX) 40 MG tablet Take 40 mg by mouth daily.  5  . verapamil (CALAN-SR) 240 MG CR tablet Take 1 tablet (240 mg  total) by mouth daily. 30 tablet 5   No current facility-administered medications for this visit.     Allergies as of 02/20/2016 - Review Complete 02/20/2016  Allergen Reaction Noted  . Asa [aspirin] Other (See Comments) 06/09/2012  . Augmentin [amoxicillin-pot clavulanate] Diarrhea 06/09/2012  . Levaquin [levofloxacin in d5w] Other (See Comments) 07/14/2012  . Rocephin [ceftriaxone sodium in dextrose] Hives and Other (See Comments) 06/09/2012  . Valtrex [valacyclovir hcl] Hives and Other (See Comments) 06/09/2012  . Zoloft [sertraline hcl] Other (See Comments) 06/09/2012   Past Medical History:  Diagnosis Date  . Anxiety   . BMI 30.0-30.9,adult 2008 182 LBS   2009 194 LBS  . Colon polyp APR 2008  . Depression   . Essential hypertension, benign   . Folliculitis 09/13/1973  . Helicobacter pylori gastritis AUG 2008 PREVPAC: nausea-->HELIDAC: GI UPSET   HB 14.1 NL HFP H. PYLORI STOOL Ag NEG  . Hypothyroidism   . Irritable bowel syndrome   . Kidney stones   . Mixed hyperlipidemia   . Mixed stress and urge urinary incontinence 09/26/2012  . Panic attacks   . Polyp of colon, hyperplastic   . Reflux   . Tubulovillous adenoma 08/07/10   Colonoscopy w/ Dr Oneida Alar w/ 7 polyps removed-bx showed tubular adenomas.  Largest 1cm-hepatic flexure polyp->TA.     Past Surgical History:  Procedure Laterality Date  . CHOLECYSTECTOMY    . COLONOSCOPY  APR 2008   AC/[place] SIMPLE ADENOMA  . COLONOSCOPY  08/07/2010   Dr. Raynald Kemp adenomas and tubulovillous adenoma; needs surveillance 2015  . COLONOSCOPY N/A 09/18/2013   Dr.Rourk- normal rectum, 2 diminutive polys in the mid sigmoid segment o/w the remainder of the colonic mucosa appeared normal.hyperplastic poylps  . ESOPHAGOGASTRODUODENOSCOPY N/A 11/29/2014   RMR: Gastric erosions. Small hiatal hernia. Status post biopsy.   Marland Kitchen MASS EXCISION  02/28/2011   Procedure: EXCISION MASS;  Surgeon: Donato Heinz, MD;  Location: [facility];  Service: General;   Laterality: Right;  soft tissue mass  . UPPER GASTROINTESTINAL ENDOSCOPY  AUG 2008 PAIN/NAUSEA   H. PYLORI treated    ROS:  General: Negative for anorexia, weight loss, fever, chills, fatigue, weakness. ENT: Negative for hoarseness, difficulty swallowing , nasal congestion. CV: Negative for angina, palpitations, dyspnea on exertion, peripheral edema. See history of present illness Respiratory: Negative for dyspnea at rest, dyspnea on exertion, cough, sputum, wheezing.  GI: See history of present illness. GU:  Negative for dysuria, hematuria, urinary incontinence, urinary frequency, nocturnal urination.  Endo: Negative for unusual weight change.    Physical Examination:   BP 138/76   Pulse 76   Temp 97.6 F (36.4 C) (Oral)   Ht '5\' 5"'$  (1.651 m)   Wt 201 lb (91.2 kg)   BMI 33.45 kg/m   General: Well-nourished, well-developed in no acute distress.  Eyes: No icterus. Mouth: Oropharyngeal mucosa moist and pink , no lesions erythema or exudate. Lungs: Clear to auscultation bilaterally.  Heart: Regular rate and rhythm, no murmurs rubs or gallops.  Abdomen: Bowel sounds are normal, nontender, nondistended, no hepatosplenomegaly or masses, no abdominal bruits or hernia , no rebound or guarding.   Extremities: No lower extremity edema. No clubbing or deformities. Neuro: Alert and oriented x 4   Skin: Warm and dry, no jaundice.   Psych: Alert and cooperative, normal mood and affect.  Labs:  Lab Results  Component Value Date   CREATININE 0.74 06/20/2015   BUN 15 06/20/2015   NA 139 06/20/2015   K 4.4 06/20/2015   CL 108 06/20/2015   CO2 20 06/20/2015   Lab Results  Component Value Date   WBC 8.1 06/20/2015   HGB 13.3 06/20/2015   HCT 40.7 06/20/2015   MCV 84.4 06/20/2015   PLT 336 06/20/2015   Lab Results  Component Value Date   ALT 24 06/20/2015   AST 22 06/20/2015   ALKPHOS 92 06/20/2015   BILITOT 0.3 06/20/2015   Lab Results  Component Value Date   LIPASE 30  12/18/2014    Imaging Studies: No results found.

## 2016-02-21 LAB — COMPREHENSIVE METABOLIC PANEL
ALBUMIN: 4.5 g/dL (ref 3.6–5.1)
ALK PHOS: 79 U/L (ref 33–130)
ALT: 28 U/L (ref 6–29)
AST: 24 U/L (ref 10–35)
BILIRUBIN TOTAL: 0.4 mg/dL (ref 0.2–1.2)
BUN: 13 mg/dL (ref 7–25)
CHLORIDE: 103 mmol/L (ref 98–110)
CO2: 25 mmol/L (ref 20–31)
CREATININE: 0.79 mg/dL (ref 0.50–1.05)
Calcium: 10.3 mg/dL (ref 8.6–10.4)
Glucose, Bld: 80 mg/dL (ref 65–99)
Potassium: 4.1 mmol/L (ref 3.5–5.3)
SODIUM: 137 mmol/L (ref 135–146)
TOTAL PROTEIN: 7.2 g/dL (ref 6.1–8.1)

## 2016-02-21 LAB — LIPID PANEL
CHOL/HDL RATIO: 3.1 ratio (ref <5.0)
Cholesterol: 156 mg/dL (ref <200)
HDL: 50 mg/dL — AB (ref >50)
LDL CALC: 88 mg/dL (ref <100)
TRIGLYCERIDES: 89 mg/dL (ref <150)
VLDL: 18 mg/dL (ref <30)

## 2016-02-21 LAB — HEPATIC FUNCTION PANEL
ALBUMIN: 4.4 g/dL (ref 3.6–5.1)
ALK PHOS: 71 U/L (ref 33–130)
ALT: 27 U/L (ref 6–29)
AST: 25 U/L (ref 10–35)
BILIRUBIN TOTAL: 0.3 mg/dL (ref 0.2–1.2)
Bilirubin, Direct: 0.1 mg/dL (ref <=0.2)
Indirect Bilirubin: 0.2 mg/dL (ref 0.2–1.2)
TOTAL PROTEIN: 7.3 g/dL (ref 6.1–8.1)

## 2016-02-21 LAB — CBC WITH DIFFERENTIAL/PLATELET
BASOS ABS: 71 {cells}/uL (ref 0–200)
BASOS PCT: 1 %
EOS ABS: 142 {cells}/uL (ref 15–500)
EOS PCT: 2 %
HCT: 43.1 % (ref 35.0–45.0)
HEMOGLOBIN: 14.4 g/dL (ref 11.7–15.5)
LYMPHS ABS: 2911 {cells}/uL (ref 850–3900)
Lymphocytes Relative: 41 %
MCH: 28.6 pg (ref 27.0–33.0)
MCHC: 33.4 g/dL (ref 32.0–36.0)
MCV: 85.5 fL (ref 80.0–100.0)
MPV: 9.2 fL (ref 7.5–12.5)
Monocytes Absolute: 426 cells/uL (ref 200–950)
Monocytes Relative: 6 %
NEUTROS ABS: 3550 {cells}/uL (ref 1500–7800)
Neutrophils Relative %: 50 %
PLATELETS: 305 10*3/uL (ref 140–400)
RBC: 5.04 MIL/uL (ref 3.80–5.10)
RDW: 13.4 % (ref 11.0–15.0)
WBC: 7.1 10*3/uL (ref 3.8–10.8)

## 2016-02-21 LAB — PTH, INTACT AND CALCIUM
CALCIUM: 10.4 mg/dL (ref 8.6–10.4)
PTH: 50 pg/mL (ref 14–64)

## 2016-02-21 LAB — LIPASE: LIPASE: 20 U/L (ref 7–60)

## 2016-02-21 LAB — TSH: TSH: 3.18 m[IU]/L

## 2016-02-21 NOTE — Assessment & Plan Note (Signed)
Chronic constipation. Up-to-date on colonoscopy. Does not take linzess regularly when she takes sporadically doesn't help much. Explained the need to take more consistently before calling this a drug failure. She agreed to resume 145 g every day to every other day.

## 2016-02-21 NOTE — Assessment & Plan Note (Signed)
Three-week history of worse chest pain, radiation into the left arm and back. Associated with nausea and vomiting. Has not been on PPI regularly. Takes "as needed". Takes a couple of times per week. She has nonexertional component to her pain. Epigastric burning. Suspect upper GI etiology, such as gastritis, peptic ulcer disease, atypical GERD. Less likely pancreatitis. Obtain labs today. Stop 10 type resolved. Start Dexilant 60mg  daily for 2 weeks. Samples provided. Will resume pantoprazole daily after that. carafate qac and qhs for 10 days. Further recommendations to follow pending labs.

## 2016-02-21 NOTE — Progress Notes (Signed)
CC'ED TO PCP 

## 2016-02-22 LAB — MICROALBUMIN / CREATININE URINE RATIO
Creatinine, Urine: 150 mg/dL (ref 20–320)
MICROALB UR: 0.4 mg/dL
Microalb Creat Ratio: 3 mcg/mg creat (ref <30)

## 2016-02-23 NOTE — Progress Notes (Signed)
Please let patient know her labs are normal, LFTs, lipase, no anemia or signs of infection in blood work. Continue Dexilant and Linzess, carafate as per OV.  Call next week with PR or sooner if needed.

## 2016-02-23 NOTE — Progress Notes (Signed)
Please let her know she needs to take the [place].

## 2016-02-28 ENCOUNTER — Encounter: Payer: Self-pay | Admitting: Family Medicine

## 2016-03-20 ENCOUNTER — Encounter: Payer: Self-pay | Admitting: Internal Medicine

## 2016-03-20 ENCOUNTER — Ambulatory Visit (INDEPENDENT_AMBULATORY_CARE_PROVIDER_SITE_OTHER): Payer: BLUE CROSS/BLUE SHIELD | Admitting: Internal Medicine

## 2016-03-20 VITALS — BP 148/84 | HR 80 | Temp 98.2°F | Ht 65.0 in | Wt 207.8 lb

## 2016-03-20 DIAGNOSIS — K5900 Constipation, unspecified: Secondary | ICD-10-CM | POA: Diagnosis not present

## 2016-03-20 DIAGNOSIS — K219 Gastro-esophageal reflux disease without esophagitis: Secondary | ICD-10-CM | POA: Diagnosis not present

## 2016-03-20 NOTE — Progress Notes (Signed)
Primary Care Physician:  Mickie Hillier, MD Primary Gastroenterologist:  Dr. Gala Romney  Pre-Procedure History & Physical: HPI:  Kaitlin [name] is a 61 y.o. female here for follow-up GERD and constipation. Has done very well on Dexilant 60 mg every other day; no further chest pain episodes. No dysphagia. Wants to stay on Dexilant.  Continues to use Carafate 4 times daily. History advanced colon polyp-due for surveillance examination 2020.  Past Medical History:  Diagnosis Date  . Anxiety   . BMI 30.0-30.9,adult 2008 182 LBS   2009 194 LBS  . Colon polyp APR 2008  . Depression   . Essential hypertension, benign   . Folliculitis 05/31/9240  . Helicobacter pylori gastritis AUG 2008 PREVPAC: nausea-->HELIDAC: GI UPSET   HB 14.1 NL HFP H. PYLORI STOOL Ag NEG  . Hypothyroidism   . Irritable bowel syndrome   . Kidney stones   . Mixed hyperlipidemia   . Mixed stress and urge urinary incontinence 09/26/2012  . Panic attacks   . Polyp of colon, hyperplastic   . Reflux   . Tubulovillous adenoma 08/07/10   Colonoscopy w/ Dr Oneida Alar w/ 7 polyps removed-bx showed tubular adenomas.  Largest 1cm-hepatic flexure polyp->TA.      Past Surgical History:  Procedure Laterality Date  . CHOLECYSTECTOMY    . COLONOSCOPY  APR 2008   AC/[place] SIMPLE ADENOMA  . COLONOSCOPY  08/07/2010   Dr. Raynald Kemp adenomas and tubulovillous adenoma; needs surveillance 2015  . COLONOSCOPY N/A 09/18/2013   Dr.Rourk- normal rectum, 2 diminutive polys in the mid sigmoid segment o/w the remainder of the colonic mucosa appeared normal.hyperplastic poylps  . ESOPHAGOGASTRODUODENOSCOPY N/A 11/29/2014   RMR: Gastric erosions. Small hiatal hernia. Status post biopsy.   Marland Kitchen MASS EXCISION  02/28/2011   Procedure: EXCISION MASS;  Surgeon: Donato Heinz, MD;  Location: [facility];  Service: General;  Laterality: Right;  soft tissue mass  . UPPER GASTROINTESTINAL ENDOSCOPY  AUG 2008 PAIN/NAUSEA   H. PYLORI treated    Prior to Admission  medications   Medication Sig Start Date End Date Taking? Authorizing Provider  ALPRAZolam Duanne Moron) 1 MG tablet Take 1 tablet (1 mg total) by mouth 3 (three) times daily. 12/28/15  Yes [name], MD  blood glucose meter kit and supplies Dispense based on patient and insurance preference. Test once a day E11.9 07/04/15  Yes [name], MD  dexlansoprazole (DEXILANT) 60 MG capsule Take 60 mg by mouth every other day.   Yes Historical Provider, MD  enalapril (VASOTEC) 20 MG tablet Take 1 tablet (20 mg total) by mouth at bedtime. 12/28/15  Yes [name], MD  furosemide (LASIX) 20 MG tablet Take 0.5 tablets (10 mg total) by mouth daily. 12/28/15  Yes [name], MD  levothyroxine (SYNTHROID, LEVOTHROID) 50 MCG tablet TAKE 1 TABLET (50 MCG TOTAL) BY MOUTH DAILY. 12/28/15  Yes [name], MD  linaclotide [facility]) 145 MCG CAPS capsule Take 1 capsule (145 mcg total) by mouth daily before breakfast. 02/20/16  Yes Mahala Menghini, PA-C  lovastatin (MEVACOR) 40 MG tablet TAKE 1 TABLET (40 MG TOTAL) BY MOUTH AT BEDTIME. 12/16/15  Yes [name], MD  ONE TOUCH ULTRA TEST test strip USE TO TEST ONCE DAILY 10/25/15  Yes [name], MD  sucralfate (CARAFATE) 1 GM/10ML suspension Take 10 mLs (1 g total) by mouth 4 (four) times daily -  with meals and at bedtime. 02/20/16  Yes Mahala Menghini, PA-C  verapamil (  CALAN-SR) 240 MG CR tablet Take 1 tablet (240 mg total) by mouth daily. 12/28/15  Yes [name], MD  pantoprazole (PROTONIX) 40 MG tablet Take 40 mg by mouth daily. 11/29/14   Historical Provider, MD    Allergies as of 03/20/2016 - Review Complete 03/20/2016  Allergen Reaction Noted  . Asa [aspirin] Other (See Comments) 06/09/2012  . Augmentin [amoxicillin-pot clavulanate] Diarrhea 06/09/2012  . Levaquin [levofloxacin in d5w] Other (See Comments) 07/14/2012  . Rocephin [ceftriaxone sodium in dextrose] Hives and Other (See Comments) 06/09/2012  . Valtrex  [valacyclovir hcl] Hives and Other (See Comments) 06/09/2012  . Zoloft [sertraline hcl] Other (See Comments) 06/09/2012    Family History  Problem Relation Age of Onset  . Colon cancer Neg Hx   . Anesthesia problems Neg Hx   . Hypotension Neg Hx   . Malignant hyperthermia Neg Hx   . Pseudochol deficiency Neg Hx   . Coronary artery disease Mother   . Hypertension Mother   . Heart attack Mother   . Heart disease Mother   . Coronary artery disease Brother     Age 53  . Heart attack Brother   . Heart disease Brother   . Diabetes Sister   . Diabetes Sister     Social History   Social History  . Marital status: Married    Spouse name: N/A  . Number of children: N/A  . Years of education: N/A   Occupational History  . Not on file.   Social History Main Topics  . Smoking status: Never Smoker  . Smokeless tobacco: Never Used     Comment: Never Smoked  . Alcohol use No  . Drug use: No  . Sexual activity: No   Other Topics Concern  . Not on file   Social History Narrative  . No narrative on file    Review of Systems: See HPI, otherwise negative ROS  Physical Exam: BP (!) 148/84   Pulse 80   Temp 98.2 F (36.8 C) (Oral)   Ht '5\' 5"'$  (1.651 m)   Wt 207 lb 12.8 oz (94.3 kg)   BMI 34.58 kg/m  General:   Alert,  Well-developed, well-nourished, pleasant and cooperative in NAD Neck:  Supple; no masses or thyromegaly. No significant cervical adenopathy. Lungs:  Clear throughout to auscultation.   No wheezes, crackles, or rhonchi. No acute distress. Heart:  Regular rate and rhythm; no murmurs, clicks, rubs,  or gallops. Abdomen: Non-distended, normal bowel sounds.  Soft and nontender without appreciable mass or hepatosplenomegaly.  Pulses:  Normal pulses noted. Extremities:  Without clubbing or edema.  Impression:  GERD symptoms much better on Dexilant 60 mg every other day. Actually, no upper GI tract symptoms at this time. Constipation, as well, managed with the  Linzess 145 every other day. Continues take Carafate-this agent likely somewhat superfluous every day. History of colonic adenomas-due for surveillance TCS 2020.  Recommendations:  Continue Dexilant 60 mg every other day  Continue Linzess 145 every other day  Use Carafate as needed for breakthrough symptoms  GERD and constipation information provided  OV in 6 months    Notice: This dictation was prepared with Dragon dictation along with smaller phrase technology. Any transcriptional errors that result from this process are unintentional and may not be corrected upon review.

## 2016-03-20 NOTE — Patient Instructions (Addendum)
Continue Dexilant 60 mg every other day  Continue Linzess 145 every other day  Use Carafate as needed for breakthrough symptoms  GERD and constipation information provided  OV in 6 months

## 2016-03-20 NOTE — Progress Notes (Signed)
error 

## 2016-03-27 ENCOUNTER — Telehealth: Payer: Self-pay | Admitting: Internal Medicine

## 2016-03-27 NOTE — Telephone Encounter (Signed)
We are out of linzess and dexilant samples. We can send in an rx if she needs it.

## 2016-03-27 NOTE — Telephone Encounter (Signed)
Routing to the refill box. 

## 2016-03-27 NOTE — Telephone Encounter (Signed)
Pt was seen last week and called today asking about her medications if they had been called into her pharmacy. I told her that the carafate had been called in, but she kept saying that she didn't need that and was put on dexilant and linzess and she needed samples because it was too expensive. Please call and advise [phone]

## 2016-03-27 NOTE — Telephone Encounter (Signed)
She needs her rx called in

## 2016-03-28 MED ORDER — DEXLANSOPRAZOLE 60 MG PO CPDR
60.0000 mg | DELAYED_RELEASE_CAPSULE | ORAL | 5 refills | Status: DC
Start: 2016-03-28 — End: 2016-06-25

## 2016-03-28 MED ORDER — LINACLOTIDE 145 MCG PO CAPS: 145.0000 ug | ORAL_CAPSULE | Freq: Every day | ORAL | 5 refills | Status: DC

## 2016-03-28 NOTE — Telephone Encounter (Signed)
Noted, no further recommendations at this time. 

## 2016-03-28 NOTE — Telephone Encounter (Signed)
Pt is aware.  

## 2016-03-28 NOTE — Telephone Encounter (Signed)
Pt called back- she cannot afford the dexilant- it is over $100 a month. I offered her a copay card. She asked that I mail it to her and she will try it and see how much it will be. If it is still too expensive, she will let us know. copay card mailed to the pt.

## 2016-03-28 NOTE — Telephone Encounter (Signed)
Please notify the patient her Rx's sent to the pharmacy

## 2016-03-28 NOTE — Addendum Note (Signed)
Addended by: Gordy Levan, Thersea Manfredonia A on: 03/28/2016 01:08 PM   Modules accepted: Orders

## 2016-04-27 ENCOUNTER — Ambulatory Visit (INDEPENDENT_AMBULATORY_CARE_PROVIDER_SITE_OTHER): Payer: BLUE CROSS/BLUE SHIELD | Admitting: Nurse Practitioner

## 2016-04-27 ENCOUNTER — Encounter: Payer: Self-pay | Admitting: Nurse Practitioner

## 2016-04-27 VITALS — BP 124/74 | Temp 98.2°F | Ht 65.0 in | Wt 205.2 lb

## 2016-04-27 DIAGNOSIS — J329 Chronic sinusitis, unspecified: Secondary | ICD-10-CM

## 2016-04-27 MED ORDER — AZITHROMYCIN 250 MG PO TABS: ORAL_TABLET | ORAL | 0 refills | Status: DC

## 2016-04-27 NOTE — Patient Instructions (Addendum)
Saline nasal spray Neosporin ointment apply small to inside nose Loratadine, Allegra (morning) Zyrtec or Xyzal (night) Zaditor eye drops

## 2016-04-30 ENCOUNTER — Encounter: Payer: Self-pay | Admitting: Nurse Practitioner

## 2016-04-30 NOTE — Progress Notes (Signed)
Subjective:  Presents for complaints of runny nose ear pain or sore throat for the past 2 weeks. No fever. Scratchy throat. Frontal area headache. Clear nasal drainage. Occasional cough. No wheezing. Bilateral ear pain. Soreness in the nose. Has begun to have some itching. Eyes at times. Has had a history of issues with seasonal allergies.  Objective:   BP 124/74   Temp 98.2 F (36.8 C) (Oral)   Ht 5\' 5"  (1.651 m)   Wt 205 lb 4 oz (93.1 kg)   BMI 34.16 kg/m  NAD. Alert, oriented. Conjunctiva clear. TMs clear effusion. Pharynx injected with green PND noted. Nasal mucosa: Septum moderately erythematous bilaterally, no lesions noted. Neck supple with mild soft anterior adenopathy. Lungs clear. Heart regular rate rhythm.  Assessment:  Rhinosinusitis    Plan:   Meds ordered this encounter  Medications  . azithromycin (ZITHROMAX Z-PAK) 250 MG tablet    Sig: Take 2 tablets (500 mg) on  Day 1,  followed by 1 tablet (250 mg) once daily on Days 2 through 5.    Dispense:  6 each    Refill:  0    Order Specific Question:   Supervising Provider    Answer:   [name] [[id number]]   Saline nasal spray Neosporin ointment apply small to inside nose Loratadine, Allegra (morning) Zyrtec or Xyzal (night) Zaditor eye drops Call back if worsens or persists.

## 2016-05-01 ENCOUNTER — Telehealth: Payer: Self-pay | Admitting: Family Medicine

## 2016-05-01 NOTE — Telephone Encounter (Signed)
Patient was seen by Hoyle Sauer on 2/23 and given z-pack. She states finished that up not any better and was told to call back and get antibiotic .[facility] [place]

## 2016-05-01 NOTE — Telephone Encounter (Signed)
Patient allergic to medication

## 2016-05-01 NOTE — Telephone Encounter (Signed)
cefzil 500 bid ten d 

## 2016-05-01 NOTE — Telephone Encounter (Signed)
Patient seen 04/27/16 given z pack and finished last pill today and no better- wants more antibiotics

## 2016-05-02 MED ORDER — CEFPROZIL 500 MG PO TABS: 500.0000 mg | ORAL_TABLET | Freq: Two times a day (BID) | ORAL | 0 refills | Status: DC

## 2016-05-02 NOTE — Telephone Encounter (Signed)
Patient notified med sent to pharmacy.  

## 2016-05-02 NOTE — Telephone Encounter (Signed)
Chest pain from rocephin can still take cefzil

## 2016-05-02 NOTE — Addendum Note (Signed)
Addended by: Jesusita Oka on: 05/02/2016 08:35 AM   Modules accepted: Orders

## 2016-05-31 ENCOUNTER — Other Ambulatory Visit: Payer: Self-pay | Admitting: Obstetrics & Gynecology

## 2016-05-31 ENCOUNTER — Telehealth: Payer: Self-pay | Admitting: Family Medicine

## 2016-05-31 DIAGNOSIS — Z1231 Encounter for screening mammogram for malignant neoplasm of breast: Secondary | ICD-10-CM

## 2016-05-31 NOTE — Telephone Encounter (Signed)
[facility] 3/29  see other message in patient calls also

## 2016-05-31 NOTE — Telephone Encounter (Signed)
She is due for her followup in May but wants to see someone else.

## 2016-05-31 NOTE — Telephone Encounter (Signed)
Left message to return call 

## 2016-05-31 NOTE — Telephone Encounter (Signed)
Tried to call no answer to get more info about symptoms. Saw carolyn on 2/23. Dx rhinosinusitis and prescribed zpack

## 2016-05-31 NOTE — Telephone Encounter (Signed)
Patient is requesting a different heart doctor at [facility].She state spoke with you at last visit about this and wanting to change.She wants to stay with [place] but different doctor.

## 2016-05-31 NOTE — Telephone Encounter (Signed)
Pt has finished all the antibiotics that she was prescribed and is still unable to get rid of the sinus issues that she is having. Pt is wanting to know what else can be done. Please advise.

## 2016-05-31 NOTE — Telephone Encounter (Signed)
That does not work , they do not allow that. Will need to go to completely different cared group in [place] or [place]

## 2016-06-04 ENCOUNTER — Encounter: Payer: Self-pay | Admitting: Family Medicine

## 2016-06-04 ENCOUNTER — Ambulatory Visit (INDEPENDENT_AMBULATORY_CARE_PROVIDER_SITE_OTHER): Payer: BLUE CROSS/BLUE SHIELD | Admitting: Family Medicine

## 2016-06-04 VITALS — BP 122/82 | Ht 65.0 in | Wt 206.4 lb

## 2016-06-04 DIAGNOSIS — J321 Chronic frontal sinusitis: Secondary | ICD-10-CM | POA: Diagnosis not present

## 2016-06-04 MED ORDER — SULFAMETHOXAZOLE-TRIMETHOPRIM 800-160 MG PO TABS: 1.0000 | ORAL_TABLET | Freq: Two times a day (BID) | ORAL | 0 refills | Status: DC

## 2016-06-04 NOTE — Telephone Encounter (Signed)
Patient seen in office by Dr Richardson Landry for symptoms 06/04/16

## 2016-06-04 NOTE — Progress Notes (Signed)
   Subjective:    Patient ID: Kaitlin [name], female    DOB: 04/10/1955, 61 y.o.   MRN: [id number]  Sinus Problem  This is a new problem. The current episode started 1 to 4 weeks ago. Associated symptoms include congestion, coughing and ear pain. (Wheezing, chills) Treatments tried: z pack and cefzil.   Patient would like referral to ENT for ongoing sinus problems, having ongoing symptoms  Also having chills   Develops crusty and gunky in the eye, with irritaiton worse n the morn   Hx of pesistent recent cough and congestion  Hx of nasal bleed from nasal spray     Review of Systems  HENT: Positive for congestion and ear pain.   Respiratory: Positive for cough.        Objective:   Physical Exam Alert, mild malaise. Hydration good Vitals stable. frontal/ maxillary tenderness evident positive nasal congestion. pharynx normal neck supple  lungs clear/no crackles or wheezes. heart regular in rhythm        Assessment & Plan:  Impression rhinosinusitis likely post viral, discussed with patient. plan antibiotics prescribed. Questions answered. Symptomatic care discussed. warning signs discussed. WSL Patient frustrated by ongoing bouts of rhinosinusitis request ENT referral to [facility]

## 2016-06-04 NOTE — Telephone Encounter (Signed)
Patient states she will stick with her current cardiologist at this time

## 2016-06-25 ENCOUNTER — Ambulatory Visit (INDEPENDENT_AMBULATORY_CARE_PROVIDER_SITE_OTHER): Payer: BLUE CROSS/BLUE SHIELD | Admitting: Family Medicine

## 2016-06-25 ENCOUNTER — Ambulatory Visit (HOSPITAL_COMMUNITY)
Admission: RE | Admit: 2016-06-25 | Discharge: 2016-06-25 | Disposition: A | Discharge status: Home / Self Care | Payer: BLUE CROSS/BLUE SHIELD | Source: Ambulatory Visit | Attending: Obstetrics & Gynecology | Admitting: Obstetrics & Gynecology

## 2016-06-25 ENCOUNTER — Encounter: Payer: Self-pay | Admitting: Family Medicine

## 2016-06-25 VITALS — BP 128/74 | Temp 98.7°F | Ht 65.0 in | Wt 206.0 lb

## 2016-06-25 DIAGNOSIS — I10 Essential (primary) hypertension: Secondary | ICD-10-CM

## 2016-06-25 DIAGNOSIS — Z13220 Encounter for screening for lipoid disorders: Secondary | ICD-10-CM

## 2016-06-25 DIAGNOSIS — E349 Endocrine disorder, unspecified: Secondary | ICD-10-CM

## 2016-06-25 DIAGNOSIS — R300 Dysuria: Secondary | ICD-10-CM | POA: Diagnosis not present

## 2016-06-25 DIAGNOSIS — E119 Type 2 diabetes mellitus without complications: Secondary | ICD-10-CM

## 2016-06-25 DIAGNOSIS — Z1231 Encounter for screening mammogram for malignant neoplasm of breast: Secondary | ICD-10-CM | POA: Insufficient documentation

## 2016-06-25 LAB — POCT URINALYSIS DIPSTICK
Spec Grav, UA: >=1.03 — AB (ref 1.010–1.025)
pH, UA: 5 (ref 5.0–8.0)

## 2016-06-25 LAB — POCT GLYCOSYLATED HEMOGLOBIN (HGB A1C): Hemoglobin A1C: 6.2

## 2016-06-25 NOTE — Progress Notes (Signed)
   Subjective:    Patient ID: Kaitlin [name], female    DOB: [birth year], 61 y.o.   MRN: [id number]  Diabetes  She presents for her follow-up diabetic visit. She has type 2 diabetes mellitus. Current diabetic treatment includes diet. Home blood sugar record trend: 80's to 100's. Eye exam is current.   Results for orders placed or performed in visit on 06/25/16  POCT glycosylated hemoglobin (Hb A1C)  Result Value Ref Range   Hemoglobin A1C 6.2    Dysuria and , Off-and-on the past few weeks. With intermittent low back pain mild aching in nature  low back pain., off and on past few weeks, recalls no injuries, More in the hips, no pinched nerve pain .   Blood pressure medicine and blood pressure levels reviewed today with patient. Compliant with blood pressure medicine. States does not miss a dose. No obvious side effects. Blood pressure generally good when checked elsewhere. Watching salt intake.   Patient claims compliance with diabetes medication. No obvious side effects. Reports no substantial low sugar spells. Most numbers are generally in good range when checked fasting. Generally does not miss a dose of medication. Watching diabetic diet closely  Patient continues to take lipid medication regularly. No obvious side effects from it. Generally does not miss a dose. Prior blood work results are reviewed with patient. Patient continues to work on fat intake in diet    Review of Systems No headache, no major weight loss or weight gain, no chest pain no back pain abdominal pain no change in bowel habits complete ROS otherwise negative     Objective:   Physical Exam Alert and oriented, vitals reviewed and stable, NAD ENT-TM's and ext canals WNL bilat via otoscopic exam Soft palate, tonsils and post pharynx WNL via oropharyngeal exam Neck-symmetric, no masses; thyroid nonpalpable and nontender Pulmonary-no tachypnea or accessory muscle use; Clear without wheezes via  auscultation Card--no abnrml murmurs, rhythm reg and rate WNL Carotid pulses symmetric, without bruits        Assessment & Plan:  Impression 1 type 2 diabetes good control discussed maintain same approach #2 history of elevated parathyroid hormone. Patient wonders about current levels discussed will check #3 hypertension good control discussed maintain same #4 dysuria urinalysis overall normal mild musculoskeletal low back symptoms. Exercise encourage. Diet exercise discussed. Further recommendations based results. Check every 6 months. WSL

## 2016-06-26 LAB — HEPATIC FUNCTION PANEL
ALBUMIN: 4.6 g/dL (ref 3.5–5.5)
ALK PHOS: 86 IU/L (ref 39–117)
ALT: 31 IU/L (ref 0–32)
AST: 31 IU/L (ref 0–40)
Bilirubin Total: 0.3 mg/dL (ref 0.0–1.2)
Bilirubin, Direct: 0.1 mg/dL (ref 0.00–0.40)
Total Protein: 7.6 g/dL (ref 6.0–8.5)

## 2016-06-26 LAB — LIPID PANEL
CHOLESTEROL TOTAL: 196 mg/dL (ref 100–199)
Chol/HDL Ratio: 3.6 ratio (ref 0.0–4.4)
HDL: 55 mg/dL (ref >39)
LDL CALC: 118 mg/dL — AB (ref 0–99)
TRIGLYCERIDES: 113 mg/dL (ref 0–149)
VLDL CHOLESTEROL CAL: 23 mg/dL (ref 5–40)

## 2016-06-26 LAB — PTH, INTACT AND CALCIUM
Calcium: 10.6 mg/dL — ABNORMAL HIGH (ref 8.7–10.2)
PTH: 44 pg/mL (ref 15–65)

## 2016-06-28 NOTE — Addendum Note (Signed)
Addended by: Carmelina Noun on: 06/28/2016 04:56 PM   Modules accepted: Orders

## 2016-07-02 ENCOUNTER — Telehealth: Payer: Self-pay | Admitting: Family Medicine

## 2016-07-02 NOTE — Telephone Encounter (Signed)
I saw the nurses already put in the referral request last wk, likely she wantse altheimer, forward to [place]

## 2016-07-02 NOTE — Telephone Encounter (Signed)
Changed note in referral that patient wants Dr Altheimer and informed referral coordinator of the patient's request

## 2016-07-02 NOTE — Telephone Encounter (Signed)
Patient said Dr. Richardson Landry was going to refer her to a Thyroid doctor because her calcium was too high?  She said she does not want to see the one in [place] and doesn't want to see Dr. Loanne Drilling at [place].  Wants to see the same doctor her pastor sees but did not know exactly who that was.  She said it is "Dr. Humphrey Rolls" in [place]. I could not find any doctors that matched this information? Patient want to be called at [phone].

## 2016-07-03 ENCOUNTER — Encounter: Payer: Self-pay | Admitting: Cardiology

## 2016-07-03 ENCOUNTER — Ambulatory Visit (INDEPENDENT_AMBULATORY_CARE_PROVIDER_SITE_OTHER): Payer: BLUE CROSS/BLUE SHIELD | Admitting: Cardiology

## 2016-07-03 VITALS — BP 124/68 | HR 91 | Ht 65.0 in | Wt 203.0 lb

## 2016-07-03 DIAGNOSIS — I10 Essential (primary) hypertension: Secondary | ICD-10-CM | POA: Diagnosis not present

## 2016-07-03 DIAGNOSIS — E782 Mixed hyperlipidemia: Secondary | ICD-10-CM

## 2016-07-03 DIAGNOSIS — R002 Palpitations: Secondary | ICD-10-CM

## 2016-07-03 DIAGNOSIS — R0789 Other chest pain: Secondary | ICD-10-CM

## 2016-07-03 NOTE — Patient Instructions (Signed)

## 2016-07-03 NOTE — Progress Notes (Signed)
Clinical Summary Ms. Bottari is a 61 y.o.female  seen today for follow up of the following medical problems.   1. Chest pain - long history of chest pain - seen by Dr Domenic Polite in 2012 for chest pain. Echo at that time without significant pathology. - 12/2009 Lexiscan nuclear stress no ishcemia - since that time continues to have intermittent chest pain described as a left sided pain radiating into midchest, 4/10 in severity. Could occur at rest or with exertion. Feel diaphoretic. Better with xanax, would last few minutes.  - more recently she reports a different more severe aching in her chest. This started a few weeks ago, and primarily occurred during 2 different episodes of significant nausea/vomiting/retching. Since her vomiting has resolved, this chest pain is resolving. Denies any SOB or DOE, no exertional symptoms.    - no recent chest pain. No SOb or DOE  - compliant with emds  2. GERD - followed by Dr Gala Romney   3. Palpitations - occasional palpitations, fairly rare.   4. 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    Past Medical History:  Diagnosis Date  . Anxiety   . BMI 30.0-30.9,adult 2008 182 LBS   2009 194 LBS  . Colon polyp APR 2008  . Depression   . Essential hypertension, benign   . Folliculitis 8/54/6270  . Helicobacter pylori gastritis AUG 2008 PREVPAC: nausea-->HELIDAC: GI UPSET   HB 14.1 NL HFP H. PYLORI STOOL Ag NEG  . Hypothyroidism   . Irritable bowel syndrome   . Kidney stones   . Mixed hyperlipidemia   . Mixed stress and urge urinary incontinence 09/26/2012  . Panic attacks   . Polyp of colon, hyperplastic   . Reflux   . Tubulovillous adenoma 08/07/10   Colonoscopy w/ Dr Oneida Alar w/ 7 polyps removed-bx showed tubular adenomas.  Largest 1cm-hepatic flexure polyp->TA.       Allergies  Allergen Reactions  . Asa [Aspirin] Other (See Comments)     Increases anxiety  . Augmentin [Amoxicillin-Pot Clavulanate] Diarrhea  . Levaquin [Levofloxacin In D5w] Other (See Comments)    chestpain  . Rocephin [Ceftriaxone Sodium In Dextrose] Hives and Other (See Comments)    blisters  . Valtrex [Valacyclovir Hcl] Hives and Other (See Comments)  . Zoloft [Sertraline Hcl] Other (See Comments)    Too strong     Current Outpatient Prescriptions  Medication Sig Dispense Refill  . ALPRAZolam (XANAX) 1 MG tablet Take 1 tablet (1 mg total) by mouth 3 (three) times daily. 90 tablet 2  . blood glucose meter kit and supplies Dispense based on patient and insurance preference. Test once a day E11.9 1 each 0  . enalapril (VASOTEC) 20 MG tablet Take 1 tablet (20 mg total) by mouth at bedtime. 30 tablet 5  . furosemide (LASIX) 20 MG tablet Take 0.5 tablets (10 mg total) by mouth daily. 30 tablet 5  . levothyroxine (SYNTHROID, LEVOTHROID) 50 MCG tablet TAKE 1 TABLET (50 MCG TOTAL) BY MOUTH DAILY. 30 tablet 5  . linaclotide (LINZESS) 145 MCG CAPS capsule Take 1 capsule (145 mcg total) by mouth daily before breakfast. 30 capsule 5  . lovastatin (MEVACOR) 40 MG tablet TAKE 1 TABLET (40 MG TOTAL) BY MOUTH AT BEDTIME. 90 tablet 1  . ONE TOUCH ULTRA TEST test strip USE TO TEST ONCE DAILY 50 each 5  .  pantoprazole (PROTONIX) 40 MG tablet Take 40 mg by mouth daily.  5  . verapamil (CALAN-SR) 240 MG CR tablet Take 1 tablet (240 mg total) by mouth daily. 30 tablet 5   No current facility-administered medications for this visit.      Past Surgical History:  Procedure Laterality Date  . CHOLECYSTECTOMY    . COLONOSCOPY  APR 2008   AC/[place] SIMPLE ADENOMA  . COLONOSCOPY  08/07/2010   Dr. Raynald Kemp adenomas and tubulovillous adenoma; needs surveillance 2015  . COLONOSCOPY N/A 09/18/2013   Dr.Rourk- normal rectum, 2 diminutive polys in the mid sigmoid segment o/w the remainder of the colonic mucosa appeared normal.hyperplastic poylps  . ESOPHAGOGASTRODUODENOSCOPY  N/A 11/29/2014   RMR: Gastric erosions. Small hiatal hernia. Status post biopsy.   Marland Kitchen MASS EXCISION  02/28/2011   Procedure: EXCISION MASS;  Surgeon: Donato Heinz, MD;  Location: [facility];  Service: General;  Laterality: Right;  soft tissue mass  . UPPER GASTROINTESTINAL ENDOSCOPY  AUG 2008 PAIN/NAUSEA   H. PYLORI treated     Allergies  Allergen Reactions  . Asa [Aspirin] Other (See Comments)    Increases anxiety  . Augmentin [Amoxicillin-Pot Clavulanate] Diarrhea  . Levaquin [Levofloxacin In D5w] Other (See Comments)    chestpain  . Rocephin [Ceftriaxone Sodium In Dextrose] Hives and Other (See Comments)    blisters  . Valtrex [Valacyclovir Hcl] Hives and Other (See Comments)  . Zoloft [Sertraline Hcl] Other (See Comments)    Too strong      Family History  Problem Relation Age of Onset  . Colon cancer Neg Hx   . Anesthesia problems Neg Hx   . Hypotension Neg Hx   . Malignant hyperthermia Neg Hx   . Pseudochol deficiency Neg Hx   . Coronary artery disease Mother   . Hypertension Mother   . Heart attack Mother   . Heart disease Mother   . Coronary artery disease Brother     Age 71  . Heart attack Brother   . Heart disease Brother   . Diabetes Sister   . Diabetes Sister      Social History Ms. Chalfin reports that she has never smoked. She has never used smokeless tobacco. Ms. Inman reports that she does not drink alcohol.   Review of Systems CONSTITUTIONAL: No weight loss, fever, chills, weakness or fatigue.  HEENT: Eyes: No visual loss, blurred vision, double vision or yellow sclerae.No hearing loss, sneezing, congestion, runny nose or sore throat.  SKIN: No rash or itching.  CARDIOVASCULAR: per hpi RESPIRATORY: No shortness of breath, cough or sputum.  GASTROINTESTINAL: No anorexia, nausea, vomiting or diarrhea. No abdominal pain or blood.  GENITOURINARY: No burning on urination, no polyuria NEUROLOGICAL: No headache, dizziness, syncope, paralysis, ataxia,  numbness or tingling in the extremities. No change in bowel or bladder control.  MUSCULOSKELETAL: No muscle, back pain, joint pain or stiffness.  LYMPHATICS: No enlarged nodes. No history of splenectomy.  PSYCHIATRIC: No history of depression or anxiety.  ENDOCRINOLOGIC: No reports of sweating, cold or heat intolerance. No polyuria or polydipsia.  Marland Kitchen   Physical Examination Vitals:   07/03/16 1350  BP: 124/68  Pulse: 91   Vitals:   07/03/16 1350  Weight: 203 lb (92.1 kg)  Height: '5\' 5"'$  (1.651 m)    Gen: resting comfortably, no acute distress HEENT: no scleral icterus, pupils equal round and reactive, no palptable cervical adenopathy,  CV: RRR, no m/r/g, no jvd Resp: Clear to auscultation bilaterally GI: abdomen is soft,  non-tender, non-distended, normal bowel sounds, no hepatosplenomegaly MSK: extremities are warm, no edema.  Skin: warm, no rash Neuro:  no focal deficits Psych: appropriate affect   Diagnostic Studies 12/2010 echo Study Conclusions  - Left ventricle: The cavity size was normal. Mild septal hypertrophy. Systolic function was normal. The estimated ejection fraction was in the range of 60% to 65%. Wall motion was normal; there were no regional wall motion abnormalities. - Aortic valve: Mildly calcified annulus. - Mitral valve: Calcified annulus. - Atrial septum: No defect or patent foramen ovale was identified.     Assessment and Plan  1. Chest pain - long history of chronic atypical chest pain with ischemic testing.  - no recent symptoms, COntinue to monitor.   2. Palpitatoins - mild symptoms, continue verapamil.   3. Hyperlipidemia - lipids have been at goal, continue statin  4. HTN - her bp is at [facility] will continue current meds  F/u 6 months      Arnoldo Lenis, M.D.

## 2016-07-04 ENCOUNTER — Encounter: Payer: Self-pay | Admitting: Family Medicine

## 2016-07-10 ENCOUNTER — Other Ambulatory Visit: Payer: BLUE CROSS/BLUE SHIELD | Admitting: Obstetrics & Gynecology

## 2016-07-26 ENCOUNTER — Ambulatory Visit (INDEPENDENT_AMBULATORY_CARE_PROVIDER_SITE_OTHER): Payer: BLUE CROSS/BLUE SHIELD | Admitting: Otolaryngology

## 2016-07-26 DIAGNOSIS — J310 Chronic rhinitis: Secondary | ICD-10-CM | POA: Diagnosis not present

## 2016-07-26 DIAGNOSIS — J343 Hypertrophy of nasal turbinates: Secondary | ICD-10-CM | POA: Diagnosis not present

## 2016-07-31 ENCOUNTER — Other Ambulatory Visit: Payer: Self-pay | Admitting: Family Medicine

## 2016-08-02 ENCOUNTER — Telehealth: Payer: Self-pay | Admitting: Family Medicine

## 2016-08-02 DIAGNOSIS — E876 Hypokalemia: Secondary | ICD-10-CM

## 2016-08-02 NOTE — Telephone Encounter (Signed)
Pt thinks her potassium is low and is requesting a lab to be ordered. Please call patient on house number.

## 2016-08-02 NOTE — Telephone Encounter (Signed)
Patient states she is very tired and having leg cramps so she thinks her potassium is low. Blood work ordered in [facility]. Patient notified.

## 2016-08-02 NOTE — Telephone Encounter (Signed)
Is there any particular reason why she thinks her potassium is too low? Please order Met 7 which will also check sodium and kidney function. Thanks.

## 2016-08-03 ENCOUNTER — Telehealth: Payer: Self-pay | Admitting: Family Medicine

## 2016-08-03 DIAGNOSIS — E876 Hypokalemia: Secondary | ICD-10-CM | POA: Diagnosis not present

## 2016-08-03 MED ORDER — ALPRAZOLAM 1 MG PO TABS: 1.0000 mg | ORAL_TABLET | Freq: Three times a day (TID) | ORAL | 5 refills | Status: DC

## 2016-08-03 NOTE — Telephone Encounter (Signed)
Ok plus five monthly ref 

## 2016-08-03 NOTE — Telephone Encounter (Signed)
Spoke with patient and informed her per Dr.Steve Luking- we are sending in refills on Xanax. Patient verbalized understanding.

## 2016-08-03 NOTE — Telephone Encounter (Signed)
Patient needs Rx for Xanax to [facility].

## 2016-08-04 LAB — BASIC METABOLIC PANEL
BUN/Creatinine Ratio: 17 (ref 9–23)
BUN: 12 mg/dL (ref 6–24)
CALCIUM: 10.8 mg/dL — AB (ref 8.7–10.2)
CHLORIDE: 104 mmol/L (ref 96–106)
CO2: 23 mmol/L (ref 18–29)
Creatinine, Ser: 0.72 mg/dL (ref 0.57–1.00)
GFR calc Af Amer: 106 mL/min/{1.73_m2} (ref >59)
GFR calc non Af Amer: 92 mL/min/{1.73_m2} (ref >59)
GLUCOSE: 90 mg/dL (ref 65–99)
POTASSIUM: 4.6 mmol/L (ref 3.5–5.2)
Sodium: 142 mmol/L (ref 134–144)

## 2016-08-10 ENCOUNTER — Encounter: Payer: Self-pay | Admitting: Internal Medicine

## 2016-08-17 ENCOUNTER — Emergency Department: Payer: Worker's Compensation

## 2016-08-17 ENCOUNTER — Emergency Department
Admission: EM | Admit: 2016-08-17 | Discharge: 2016-08-17 | Disposition: A | Discharge status: Home / Self Care | Payer: Worker's Compensation | Attending: Emergency Medicine | Admitting: Emergency Medicine

## 2016-08-17 ENCOUNTER — Ambulatory Visit: Payer: BLUE CROSS/BLUE SHIELD | Admitting: Medical

## 2016-08-17 ENCOUNTER — Encounter: Payer: Self-pay | Admitting: Medical

## 2016-08-17 VITALS — BP 150/80 | HR 74 | Temp 97.0°F | Resp 18 | Ht 65.0 in | Wt 203.0 lb

## 2016-08-17 DIAGNOSIS — Y999 Unspecified external cause status: Secondary | ICD-10-CM | POA: Insufficient documentation

## 2016-08-17 DIAGNOSIS — S80211A Abrasion, right knee, initial encounter: Secondary | ICD-10-CM

## 2016-08-17 DIAGNOSIS — E039 Hypothyroidism, unspecified: Secondary | ICD-10-CM | POA: Insufficient documentation

## 2016-08-17 DIAGNOSIS — S80219A Abrasion, unspecified knee, initial encounter: Secondary | ICD-10-CM | POA: Diagnosis not present

## 2016-08-17 DIAGNOSIS — Z79899 Other long term (current) drug therapy: Secondary | ICD-10-CM | POA: Diagnosis not present

## 2016-08-17 DIAGNOSIS — Z026 Encounter for examination for insurance purposes: Secondary | ICD-10-CM

## 2016-08-17 DIAGNOSIS — S8001XA Contusion of right knee, initial encounter: Secondary | ICD-10-CM

## 2016-08-17 DIAGNOSIS — Y92410 Unspecified street and highway as the place of occurrence of the external cause: Secondary | ICD-10-CM | POA: Insufficient documentation

## 2016-08-17 DIAGNOSIS — S8992XA Unspecified injury of left lower leg, initial encounter: Secondary | ICD-10-CM | POA: Diagnosis present

## 2016-08-17 DIAGNOSIS — I10 Essential (primary) hypertension: Secondary | ICD-10-CM | POA: Insufficient documentation

## 2016-08-17 DIAGNOSIS — W01198A Fall on same level from slipping, tripping and stumbling with subsequent striking against other object, initial encounter: Secondary | ICD-10-CM | POA: Diagnosis not present

## 2016-08-17 DIAGNOSIS — M25562 Pain in left knee: Secondary | ICD-10-CM

## 2016-08-17 DIAGNOSIS — W19XXXA Unspecified fall, initial encounter: Secondary | ICD-10-CM

## 2016-08-17 DIAGNOSIS — S8002XA Contusion of left knee, initial encounter: Secondary | ICD-10-CM

## 2016-08-17 DIAGNOSIS — Y9301 Activity, walking, marching and hiking: Secondary | ICD-10-CM | POA: Diagnosis not present

## 2016-08-17 IMAGING — CR DG KNEE COMPLETE 4+V*L*
4 series · 4 of 4 positions shown · non-contrast
Comparison: Left knee radiographs performed [DATE]

CLINICAL DATA: Status post fall on sidewalk, with left knee
swelling and mild deformity. Initial encounter.

EXAM:
LEFT KNEE - COMPLETE 4+ VIEW

[knee ap]
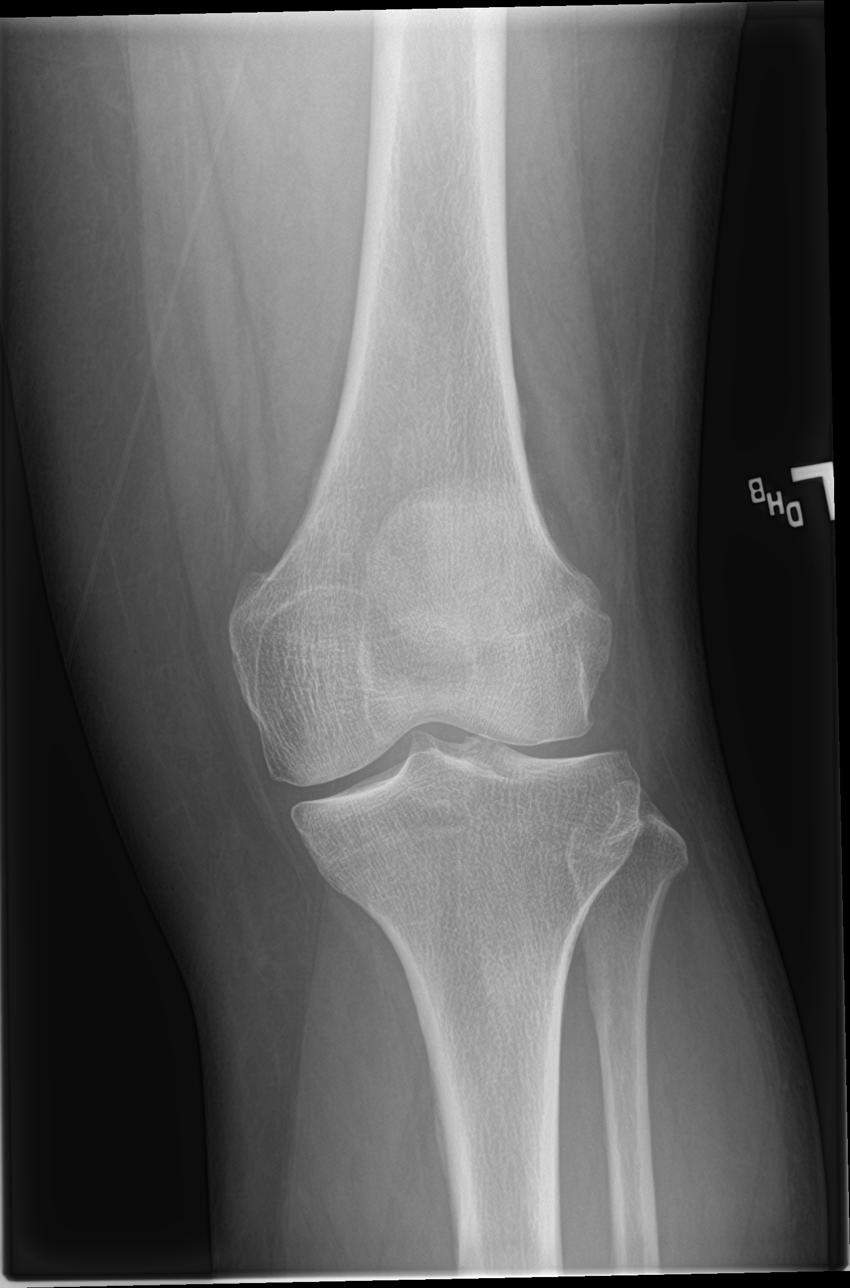

[knee obl (1 of 2)]
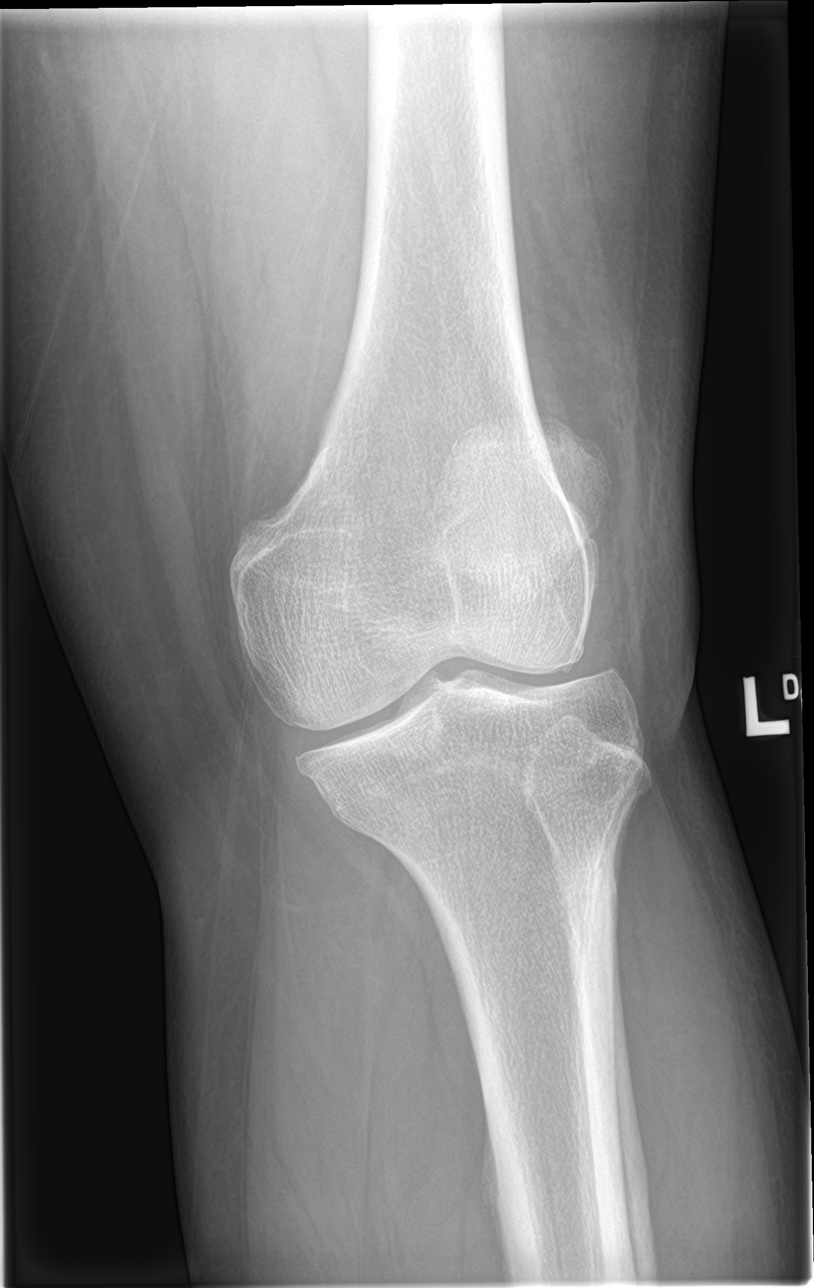

[knee obl (2 of 2)]
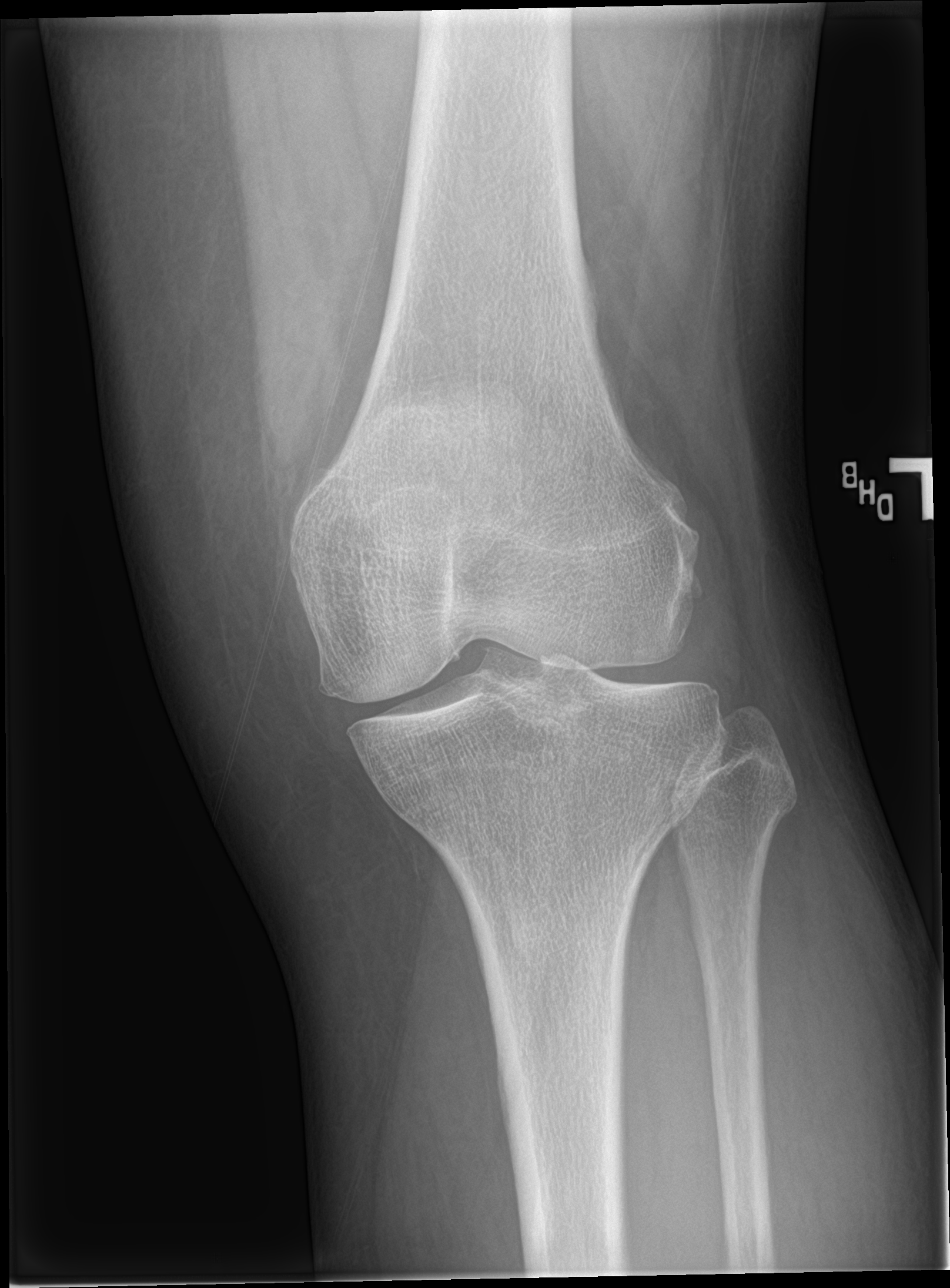

[knee lat]
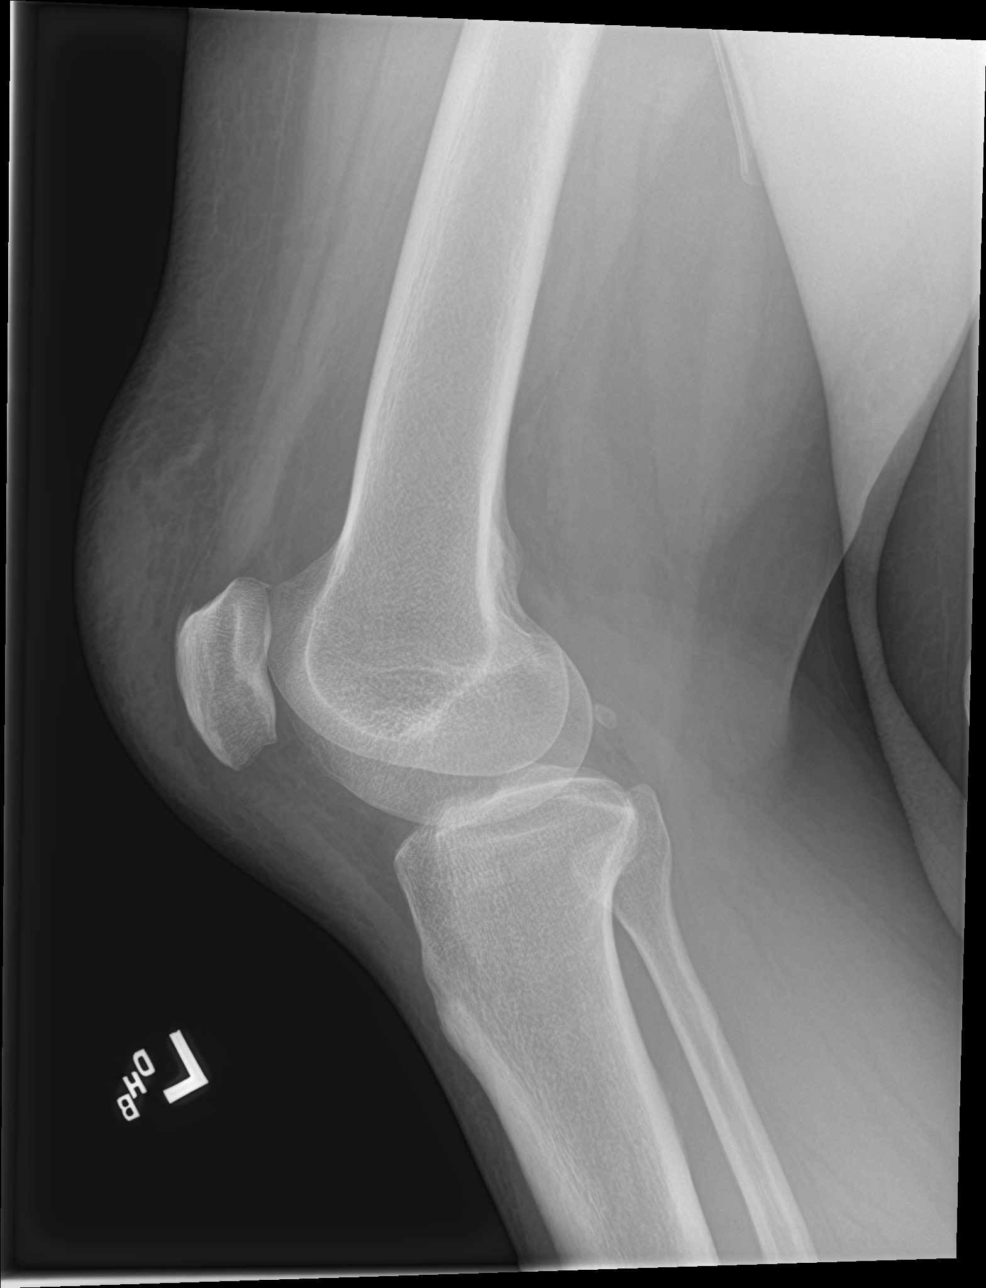

[4 of 4 positions shown; findings below may reference images not displayed]

FINDINGS: There is no evidence of fracture or dislocation. The joint spaces
are preserved. No significant degenerative change is seen; the
patellofemoral joint is grossly unremarkable in appearance. A
fabella is noted.

No significant joint effusion is seen. Soft tissue swelling is noted
overlying the patella and at the medial quadriceps.
IMPRESSION: 1. No evidence of fracture or dislocation.
2. Soft tissue swelling overlying the patella and at the medial
quadriceps.

## 2016-08-17 MED ORDER — BACITRACIN ZINC 500 UNIT/GM EX OINT
TOPICAL_OINTMENT | Freq: Once | CUTANEOUS | Status: AC
  Administered 2016-08-17: 06:00:00 via TOPICAL

## 2016-08-17 MED ORDER — BACITRACIN ZINC 500 UNIT/GM EX OINT
TOPICAL_OINTMENT | CUTANEOUS | Status: AC
  Filled 2016-08-17: qty 0.9

## 2016-08-17 MED ORDER — IBUPROFEN 600 MG PO TABS
600.0000 mg | ORAL_TABLET | Freq: Once | ORAL | Status: AC
  Administered 2016-08-17: 600 mg via ORAL
  Filled 2016-08-17: qty 1

## 2016-08-17 MED ORDER — TRAMADOL HCL 50 MG PO TABS: 50.0000 mg | ORAL_TABLET | Freq: Four times a day (QID) | ORAL | 0 refills | Status: DC | PRN

## 2016-08-17 MED ORDER — ETODOLAC 200 MG PO CAPS: 200.0000 mg | ORAL_CAPSULE | Freq: Three times a day (TID) | ORAL | 0 refills | Status: DC

## 2016-08-17 NOTE — ED Notes (Signed)

## 2016-08-17 NOTE — ED Notes (Signed)
Pt reports was ambulating on the sidewalk at work when she slipped and fell onto her knees and then landing on her face. Pt c/o LEFT knee pain, swelling noted. Small abrasions noted to RIGHT knee. Pt denies LOC, pt alert and oriented.

## 2016-08-17 NOTE — ED Notes (Signed)
ED Provider at bedside. 

## 2016-08-17 NOTE — ED Triage Notes (Signed)
Pt is employee at [employer] and fell on sidewalk tonight.  Pt has swelling and mild deformity noted to L knee. Pt is A&Ox4, in NAD, and ambulatory to triage.

## 2016-08-17 NOTE — ED Provider Notes (Signed)
[facility] Emergency Department Provider Note   ____________________________________________   First MD Initiated Contact with Patient 08/17/16 [phone]     (approximate)  I have reviewed the triage vital signs and the nursing notes.   HISTORY  Chief Complaint Fall    HPI Kaitlin [name] is a 61 y.o. female who comes into the hospital today after a fall at work. The patient works at [employer] reports that she was walking on the sidewalk. She states that there was a dip in the sidewalk and she lost her balance. The patient reports that she fell onto her knee is in her face. This occurred around midnight. She went down hard initially on her knees and then onto her face. The patient reports that she did put her hands out to protect herself but she did break her glasses. The patient denies any loss of consciousness. She states that she laid for a couple of minutes and then was helped up. She has bilateral knee pain with a knot and swelling to the left knee. The patient rates her pain a 3 out of 10 in intensity. She was sent in by her supervisor for evaluation today.   Past Medical History:  Diagnosis Date  . Anxiety   . BMI 30.0-30.9,adult 2008 182 LBS   2009 194 LBS  . Colon polyp APR 2008  . Depression   . Essential hypertension, benign   . Folliculitis 8/56/3149  . Helicobacter pylori gastritis AUG 2008 PREVPAC: nausea-->HELIDAC: GI UPSET   HB 14.1 NL HFP H. PYLORI STOOL Ag NEG  . Hypothyroidism   . Irritable bowel syndrome   . Kidney stones   . Mixed hyperlipidemia   . Mixed stress and urge urinary incontinence 09/26/2012  . Panic attacks   . Polyp of colon, hyperplastic   . Reflux   . Tubulovillous adenoma 08/07/10   Colonoscopy w/ Dr Oneida Alar w/ 7 polyps removed-bx showed tubular adenomas.  Largest 1cm-hepatic flexure polyp->TA.      Patient Active Problem List   Diagnosis Date Noted  . Abdominal pain, epigastric 02/20/2016  . GERD (gastroesophageal  reflux disease) 02/20/2016  . Mucosal abnormality of stomach   . Hiatal hernia   . Impaired fasting glucose 06/04/2014  . Benign paroxysmal positional vertigo 06/04/2014  . Constipation 02/17/2014  . Elevated parathyroid hormone 05/24/2013  . Folliculitis 70/26/3785  . Mixed stress and urge urinary incontinence 09/26/2012  . Subserous leiomyoma of uterus 09/01/2012  . Unspecified hypothyroidism 06/12/2012  . Venous stasis 06/12/2012  . Essential hypertension, benign 12/11/2010  . Chest pain 12/11/2010  . IBS (irritable bowel syndrome) 07/05/2010  . Hx of adenomatous colonic polyps 07/05/2010    Past Surgical History:  Procedure Laterality Date  . CHOLECYSTECTOMY    . COLONOSCOPY  APR 2008   AC/[place] SIMPLE ADENOMA  . COLONOSCOPY  08/07/2010   Dr. Raynald Kemp adenomas and tubulovillous adenoma; needs surveillance 2015  . COLONOSCOPY N/A 09/18/2013   Dr.Rourk- normal rectum, 2 diminutive polys in the mid sigmoid segment o/w the remainder of the colonic mucosa appeared normal.hyperplastic poylps  . ESOPHAGOGASTRODUODENOSCOPY N/A 11/29/2014   RMR: Gastric erosions. Small hiatal hernia. Status post biopsy.   Marland Kitchen MASS EXCISION  02/28/2011   Procedure: EXCISION MASS;  Surgeon: Donato Heinz, MD;  Location: [facility];  Service: General;  Laterality: Right;  soft tissue mass  . UPPER GASTROINTESTINAL ENDOSCOPY  AUG 2008 PAIN/NAUSEA   H. PYLORI treated    Prior to Admission medications   Medication  Sig Start Date End Date Taking? Authorizing Provider  ALPRAZolam Duanne Moron) 1 MG tablet Take 1 tablet (1 mg total) by mouth 3 (three) times daily. 08/03/16  Yes [name], MD  enalapril (VASOTEC) 20 MG tablet Take 1 tablet (20 mg total) by mouth at bedtime. 12/28/15  Yes [name], MD  furosemide (LASIX) 20 MG tablet Take 0.5 tablets (10 mg total) by mouth daily. 12/28/15  Yes [name], MD  levothyroxine (SYNTHROID, LEVOTHROID) 50 MCG tablet TAKE 1 TABLET (50 MCG TOTAL) BY MOUTH  DAILY. 07/31/16  Yes [name], MD  linaclotide [facility]) 145 MCG CAPS capsule Take 1 capsule (145 mcg total) by mouth daily before breakfast. 03/28/16  Yes Carlis Stable, NP  lovastatin (MEVACOR) 40 MG tablet TAKE 1 TABLET (40 MG TOTAL) BY MOUTH AT BEDTIME. 12/16/15  Yes [name], MD  pantoprazole (PROTONIX) 40 MG tablet Take 40 mg by mouth daily. 11/29/14  Yes [provider]  verapamil (CALAN-SR) 240 MG CR tablet Take 1 tablet (240 mg total) by mouth daily. 12/28/15  Yes [name], MD  blood glucose meter kit and supplies Dispense based on patient and insurance preference. Test once a day E11.9 07/04/15   [name], MD  etodolac (LODINE) 200 MG capsule Take 1 capsule (200 mg total) by mouth every 8 (eight) hours. 08/17/16   Loney Hering, MD  ONE TOUCH ULTRA TEST test strip USE TO TEST ONCE DAILY 10/25/15   [name], MD  traMADol (ULTRAM) 50 MG tablet Take 1 tablet (50 mg total) by mouth every 6 (six) hours as needed. 08/17/16   Loney Hering, MD    Allergies Asa [aspirin]; Augmentin [amoxicillin-pot clavulanate]; Levaquin [levofloxacin in d5w]; Rocephin [ceftriaxone sodium in dextrose]; Valtrex [valacyclovir hcl]; and Zoloft [sertraline hcl]  Family History  Problem Relation Age of Onset  . Coronary artery disease Mother   . Hypertension Mother   . Heart attack Mother   . Heart disease Mother   . Coronary artery disease Brother        Age 22  . Heart attack Brother   . Heart disease Brother   . Diabetes Sister   . Diabetes Sister   . Colon cancer Neg Hx   . Anesthesia problems Neg Hx   . Hypotension Neg Hx   . Malignant hyperthermia Neg Hx   . Pseudochol deficiency Neg Hx     Social History Social History  Substance Use Topics  . Smoking status: Never Smoker  . Smokeless tobacco: Never Used     Comment: Never Smoked  . Alcohol use No    Review of Systems  Constitutional: No fever/chills Eyes: No visual changes. ENT:  No sore throat. Cardiovascular: Denies chest pain. Respiratory: Denies shortness of breath. Gastrointestinal: No abdominal pain.  No nausea, no vomiting.  No diarrhea.  No constipation. Genitourinary: Negative for dysuria. Musculoskeletal: Knee pain Skin: Abrasions to bilateral knees Neurological: Negative for headaches, focal weakness or numbness.   ____________________________________________   PHYSICAL EXAM:  VITAL SIGNS: ED Triage Vitals  Enc Vitals Group     BP 08/17/16 0204 (!) 154/77     Pulse Rate 08/17/16 0204 91     Resp 08/17/16 0503 16     Temp 08/17/16 0503 97.7 F (36.5 C)     Temp Source 08/17/16 0503 Oral     SpO2 08/17/16 0204 98 %     Weight 08/17/16 0204 203 lb (92.1 kg)  Height 08/17/16 0204 '5\' 5"'$  (1.651 m)     Head Circumference --      Peak Flow --      Pain Score 08/17/16 0204 3     Pain Loc --      Pain Edu? --      Excl. in [place]? --     Constitutional: Alert and oriented. Well appearing and in Mild distress. Eyes: Conjunctivae are normal. PERRL. EOMI. Head: Atraumatic. Nose: No congestion/rhinnorhea. Mouth/Throat: Mucous membranes are moist.  Oropharynx non-erythematous. Cardiovascular: Normal rate, regular rhythm. Grossly normal heart sounds.  Good peripheral circulation. Respiratory: Normal respiratory effort.  No retractions. Lungs CTAB. Gastrointestinal: Soft and nontender. No distention. Positive bowel sounds Musculoskeletal: No lower extremity tenderness nor edema. Soft tissue swelling and hematoma to left knee with some pain with passive range of motion Neurologic:  Normal speech and language. No gross focal neurologic deficits are appreciated. No gait instability. Skin:  Skin is warm, dry and intact. Abrasions and contusions to bilateral knees. Psychiatric: Mood and affect are normal.   ____________________________________________   LABS (all labs ordered are listed, but only abnormal results are displayed)  Labs Reviewed - No  data to display ____________________________________________  EKG  none ____________________________________________  RADIOLOGY  Dg Knee Complete 4 Views Left  Result Date: 08/17/2016 CLINICAL DATA:  Status post fall on sidewalk, with left knee swelling and mild deformity. Initial encounter. EXAM: LEFT KNEE - COMPLETE 4+ VIEW COMPARISON:  Left knee radiographs performed 05/11/2014 FINDINGS: There is no evidence of fracture or dislocation. The joint spaces are preserved. No significant degenerative change is seen; the patellofemoral joint is grossly unremarkable in appearance. A fabella is noted. No significant joint effusion is seen. Soft tissue swelling is noted overlying the patella and at the medial quadriceps. IMPRESSION: 1. No evidence of fracture or dislocation. 2. Soft tissue swelling overlying the patella and at the medial quadriceps. Electronically Signed   By: Garald Balding M.D.   On: 08/17/2016 02:38    ____________________________________________   PROCEDURES  Procedure(s) performed: None  Procedures  Critical Care performed: No  ____________________________________________   INITIAL IMPRESSION / ASSESSMENT AND PLAN / ED COURSE  Pertinent labs & imaging results that were available during my care of the patient were reviewed by me and considered in my medical decision making (see chart for details).  This is a 61 year old female who comes into the hospital today with some knee pain bilaterally. The patient fell at work. We did send the patient for an x-ray of her left knee given her hematoma and swelling and it did not show any fractures. I will place the patient in a knee immobilizer and put her on crutches. The patient did receive a dose of ibuprofen. She'll be discharged home to follow-up with orthopedic surgery for further evaluation of this contusion versus internal knee derangement. The patient will be discharged home. He  Clinical Course as of Aug 17 700  Fri  Aug 17, 2016  0458 1. No evidence of fracture or dislocation. 2. Soft tissue swelling overlying the patella and at the medial quadriceps.   DG Knee Complete 4 Views Left [AW]    Clinical Course User Index [AW] Loney Hering, MD     ____________________________________________   FINAL CLINICAL IMPRESSION(S) / ED DIAGNOSES  Final diagnoses:  Fall, initial encounter  Abrasion of knee, unspecified laterality, initial encounter  Contusion of left knee, initial encounter      NEW MEDICATIONS STARTED DURING THIS VISIT:  New Prescriptions  ETODOLAC (LODINE) 200 MG CAPSULE    Take 1 capsule (200 mg total) by mouth every 8 (eight) hours.   TRAMADOL (ULTRAM) 50 MG TABLET    Take 1 tablet (50 mg total) by mouth every 6 (six) hours as needed.     Note:  This document was prepared using Dragon voice recognition software and may include unintentional dictation errors.    Loney Hering, MD 08/17/16 [phone]

## 2016-08-17 NOTE — Progress Notes (Signed)
   Subjective:    Patient ID: Kaitlin [name], female    DOB: 05/09/1955, 61 y.o.   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  She fell forward with her arms outstretched. She did not hit her head or loose consciousness. Her glasses did break). She did not injure her hands, wrists or elbows. She landed on Her knees.  She went to the  [facility] Emergency Department  and they did an x-ray of her left knee which showed no fracture or dislocation.  She was discharged with the diagnosis of abrasion and contusion of the right knee, and contusion and swelling to the left knee. They recommended  2 days follow up with Dr. Mack Guise [facility]) and a  2 day follow up with her doctor ( elevated blood pressure). After getting discharged she came to the [facility] to help with getting her follow up appointment.    Review of Systems  Constitutional: Negative.   HENT: Negative for congestion.   Eyes: Negative for discharge.  Respiratory: Negative for cough and shortness of breath.   Cardiovascular: Negative for chest pain.  Gastrointestinal: Negative for abdominal pain.  Genitourinary: Negative for hematuria.  Musculoskeletal: Positive for joint swelling. Negative for back pain and neck pain.  Neurological: Negative for dizziness and headaches.  Psychiatric/Behavioral: Negative for confusion.       Objective:   Physical Exam  Constitutional: She is oriented to person, place, and time. She appears well-developed and well-nourished.  HENT:  Head: Normocephalic and atraumatic.  Eyes: Conjunctivae and EOM are normal. Pupils are equal, round, and reactive to light.  Neck: Normal range of motion. Neck supple.  Cardiovascular: Normal rate, regular rhythm and normal heart sounds.  Exam reveals no gallop and no friction rub.   No murmur heard. Pulmonary/Chest: Effort normal and  breath sounds normal.  Musculoskeletal: She exhibits edema and tenderness. She exhibits no deformity.       Right knee: She exhibits swelling. Tenderness found.       Left knee: She exhibits decreased range of motion and swelling. She exhibits no deformity, no LCL laxity, no bony tenderness and no MCL laxity. Tenderness found. No MCL and no LCL tenderness noted.       Legs: Neurological: She is alert and oriented to person, place, and time.  Skin: Skin is warm and dry.  Psychiatric: She has a normal mood and affect. Her behavior is normal. Judgment and thought content normal.  Nursing note and vitals reviewed.  Right knee with abrasions.Good ROM , can bear weight. Left knee pain on medial , lateral and posterior drawers test, no pain on anterior drawers test. Very Swollen over patella. Limited ROM secondary to pain. Using crutches to walk.      Assessment & Plan:  Work[employer] Right knee contusion and abrasions triple antibiotic ointment to the site and large bandages placed (2). Left knee pain, contusion patient to wear soft knee immobilizer. Appointment made for  Monday 12:45 pm  With [facility]. Patient also to follow up with her doctor on her elevated blood pressure.She has no questions at discharge and verbalizes understanding. The patient was called and communicated about her [facility] appointment. Return to the clinic on 08/22/16 for recheck.

## 2016-08-22 ENCOUNTER — Encounter: Payer: Self-pay | Admitting: Medical

## 2016-08-22 ENCOUNTER — Ambulatory Visit: Payer: BLUE CROSS/BLUE SHIELD | Admitting: Medical

## 2016-08-22 VITALS — BP 120/78 | HR 78 | Temp 97.7°F | Resp 16 | Ht 65.0 in | Wt 205.0 lb

## 2016-08-22 DIAGNOSIS — M25562 Pain in left knee: Secondary | ICD-10-CM

## 2016-08-22 DIAGNOSIS — M25561 Pain in right knee: Secondary | ICD-10-CM

## 2016-08-22 DIAGNOSIS — S8000XD Contusion of unspecified knee, subsequent encounter: Secondary | ICD-10-CM

## 2016-08-22 DIAGNOSIS — S80211D Abrasion, right knee, subsequent encounter: Secondary | ICD-10-CM

## 2016-08-22 NOTE — Progress Notes (Signed)
   Subjective:    Patient ID: Kaitlin [name], female    DOB: 1955-05-26, 61 y.o.   MRN: [id number]  HPI 61 yo female returns for follow up from fall , with bilateral knee injury. Seen by Rosanne Gutting   And given two injections into each knee and placed in velcro soft knee braces.  Patent presents with knee braces in place. Moving knees easily while on exam table. Given exercises to practice at home., which she states she is doing without difficulty and walking without crutches. Had patient remove velcro braces for examination. She also is accompanied by her daughter Kaitlin [name].   Review of Systems     Objective:   Physical Exam  Constitutional: She is oriented to person, place, and time. She appears well-developed and well-nourished.  HENT:  Head: Normocephalic and atraumatic.  Eyes: Conjunctivae and EOM are normal. Pupils are equal, round, and reactive to light.  Neck: Normal range of motion.  Musculoskeletal: Normal range of motion. She exhibits edema and tenderness. She exhibits no deformity.       Right knee: She exhibits swelling and ecchymosis. She exhibits no deformity.       Left knee: She exhibits swelling, effusion and ecchymosis. She exhibits no deformity.       Legs: Neurological: She is alert and oriented to person, place, and time.  Skin: Skin is warm and dry.  Psychiatric: She has a normal mood and affect. Her behavior is normal. Judgment and thought content normal.  Nursing note and vitals reviewed. On the diagram A is for abrasion marked in red. The blue color is for contusion.  Dry crusted small abrasion about  1 cm on the upper lateral portion of the right patella, no signs of infection. Both knees are swollen , mild bruising on the right patella.  Moderate bruising on the left patella with some bruising noted on the posterior lateral side.     Gait gingerly but walking full bearing full  weight . Assessment & Plan:  Workers Health and safety inspector. Knee pain, knee swelling,  contusion.Bilaterally.  Right knee abrasion. Patient not taking any tramadol for pain but using Advil or Tylenol as needed for pain.To continue exercises per Orthopedics.  She has follow up on Monday and then she will  come to the clinic afterwards for a recheck and to review her  treatment plan.  She says they mentioned Physical Therapy but she is not sure about that or  if it will keep her out of work longer. I reviewed with the patient that their recommendations/ treatment plan are to be followed. Appointment here at  2 pm on Monday 08/27/2016

## 2016-08-27 ENCOUNTER — Ambulatory Visit: Payer: BLUE CROSS/BLUE SHIELD | Admitting: Medical

## 2016-08-27 VITALS — BP 120/76 | HR 90 | Temp 97.6°F | Resp 16

## 2016-08-27 DIAGNOSIS — S8001XD Contusion of right knee, subsequent encounter: Secondary | ICD-10-CM

## 2016-08-27 DIAGNOSIS — Z026 Encounter for examination for insurance purposes: Secondary | ICD-10-CM

## 2016-08-27 DIAGNOSIS — S8002XD Contusion of left knee, subsequent encounter: Secondary | ICD-10-CM

## 2016-08-27 NOTE — Progress Notes (Signed)
Patient fell forward on uneven pavement and landed on both of her knees.  She was seen by ED and has been seen in this clinic and with [facility].  She went to [facility] this morning and saw Kaitlin [name] who is recommending PT and wants to put restrictions on her work to * hours shifts with no climbing, crawling, kneeling until her follow up on 09/17/16.  She is wearing knee braces on both knees.  Subjective:    Patient ID: Kaitlin [name], female    DOB: 1955/09/08, 61 y.o.   MRN: [id number]  HPI Patient returns today with note from [facility] Kaitlin [name], placed patient on  8 hour days with no kneeling , no climbing , crawling  or kneeling. Reviewed with patient that Kaitlin [name] / Kaitlin [name] are her supervising personal and she needs to discuss her return to work with them and Kaitlin [name] note. She says she is also seeing Kaitlin [name] in [employer]. Presents with knee braces in place. Next follow up  With Kaitlin [name] September 17 2016 9:20 am .   Review of Systems  Musculoskeletal: Positive for joint swelling.  All other systems reviewed and are negative. Bilateral knee tenderness but feeling much better.     Objective:   Physical Exam Sitting comfortably on exam table able to extend and flex at the knees. Had patient removed knee braces so that I could exam her knees. Right knee with swelling and contusion, abrasion now healed. No posterior knee bruising Left  Knee with more swelling and contusion than the right knee and with lateral posterior side contusion as well. Patient gait within normal limits.      Assessment & Plan:  Worker Compensation  Bilateral Knee swelling and contusions. Follow up with me on the same day as Kaitlin [name]  09/17/16. Asked patient to be patient while her knees heal. She wants to come back to work sooner working  12 hour days. I explained that we need to follow up with the orthopedic specialist recommendatons.

## 2016-09-03 ENCOUNTER — Encounter: Payer: Self-pay | Admitting: Obstetrics & Gynecology

## 2016-09-03 ENCOUNTER — Other Ambulatory Visit (HOSPITAL_COMMUNITY)
Admission: RE | Admit: 2016-09-03 | Discharge: 2016-09-03 | Disposition: A | Discharge status: Home / Self Care | Payer: BLUE CROSS/BLUE SHIELD | Source: Ambulatory Visit | Attending: Obstetrics & Gynecology | Admitting: Obstetrics & Gynecology

## 2016-09-03 ENCOUNTER — Other Ambulatory Visit: Payer: Self-pay | Admitting: *Deleted

## 2016-09-03 ENCOUNTER — Ambulatory Visit (INDEPENDENT_AMBULATORY_CARE_PROVIDER_SITE_OTHER): Payer: BLUE CROSS/BLUE SHIELD | Admitting: Obstetrics & Gynecology

## 2016-09-03 VITALS — BP 122/74 | HR 86 | Ht 65.0 in | Wt 202.0 lb

## 2016-09-03 DIAGNOSIS — Z01419 Encounter for gynecological examination (general) (routine) without abnormal findings: Secondary | ICD-10-CM | POA: Diagnosis not present

## 2016-09-03 DIAGNOSIS — Z1211 Encounter for screening for malignant neoplasm of colon: Secondary | ICD-10-CM | POA: Diagnosis not present

## 2016-09-03 DIAGNOSIS — Z1212 Encounter for screening for malignant neoplasm of rectum: Secondary | ICD-10-CM

## 2016-09-03 DIAGNOSIS — N3941 Urge incontinence: Secondary | ICD-10-CM

## 2016-09-03 MED ORDER — OXYBUTYNIN CHLORIDE ER 10 MG PO TB24: 10.0000 mg | ORAL_TABLET | Freq: Every day | ORAL | 11 refills | Status: DC

## 2016-09-03 MED ORDER — VERAPAMIL HCL ER 240 MG PO TBCR: 240.0000 mg | EXTENDED_RELEASE_TABLET | Freq: Every day | ORAL | 0 refills | Status: DC

## 2016-09-03 NOTE — Progress Notes (Signed)
Subjective:     Kaitlin [name] is a 61 y.o. female here for a routine exam.  No LMP recorded. Patient is postmenopausal. G1P1 Birth Control Method:  Post menopausal Menstrual Calendar(currently): amenorrhiec confirmed today  Current complaints: working up her hypercalcemia, can't make it to the bathroom, gets up 3-4 times when she sleeps, has to wear a pad, not coughing sneezing loss, just any ol time  Current acute medical issues:  Left knee injury at work   Recent Gynecologic History No LMP recorded. Patient is postmenopausal. Last Pap: 2017,  normal Last mammogram: 2018,  normal  Past Medical History:  Diagnosis Date  . Anxiety   . BMI 30.0-30.9,adult 2008 182 LBS   2009 194 LBS  . Colon polyp APR 2008  . Depression   . Essential hypertension, benign   . Folliculitis 4/48/1856  . Helicobacter pylori gastritis AUG 2008 PREVPAC: nausea-->HELIDAC: GI UPSET   HB 14.1 NL HFP H. PYLORI STOOL Ag NEG  . Hypothyroidism   . Irritable bowel syndrome   . Kidney stones   . Mixed hyperlipidemia   . Mixed stress and urge urinary incontinence 09/26/2012  . Panic attacks   . Polyp of colon, hyperplastic   . Reflux   . Tubulovillous adenoma 08/07/10   Colonoscopy w/ Dr Oneida Alar w/ 7 polyps removed-bx showed tubular adenomas.  Largest 1cm-hepatic flexure polyp->TA.      Past Surgical History:  Procedure Laterality Date  . CHOLECYSTECTOMY    . COLONOSCOPY  APR 2008   AC/[facility] SIMPLE ADENOMA  . COLONOSCOPY  08/07/2010   Dr. Raynald Kemp adenomas and tubulovillous adenoma; needs surveillance 2015  . COLONOSCOPY N/A 09/18/2013   Dr.Rourk- normal rectum, 2 diminutive polys in the mid sigmoid segment o/w the remainder of the colonic mucosa appeared normal.hyperplastic poylps  . ESOPHAGOGASTRODUODENOSCOPY N/A 11/29/2014   RMR: Gastric erosions. Small hiatal hernia. Status post biopsy.   Marland Kitchen MASS EXCISION  02/28/2011   Procedure: EXCISION MASS;  Surgeon: Donato Heinz, MD;  Location: [facility];   Service: General;  Laterality: Right;  soft tissue mass  . UPPER GASTROINTESTINAL ENDOSCOPY  AUG 2008 PAIN/NAUSEA   H. PYLORI treated    OB History    Gravida Para Term Preterm AB Living   1 1           SAB TAB Ectopic Multiple Live Births                  Social History   Social History  . Marital status: Married    Spouse name: N/A  . Number of children: N/A  . Years of education: N/A   Social History Main Topics  . Smoking status: Never Smoker  . Smokeless tobacco: Never Used     Comment: Never Smoked  . Alcohol use No  . Drug use: No  . Sexual activity: No   Other Topics Concern  . None   Social History Narrative  . None    Family History  Problem Relation Age of Onset  . Coronary artery disease Mother   . Hypertension Mother   . Heart attack Mother   . Heart disease Mother   . Coronary artery disease Brother        Age 42  . Heart attack Brother   . Heart disease Brother   . Diabetes Sister   . Diabetes Sister   . Colon cancer Neg Hx   . Anesthesia problems Neg Hx   . Hypotension Neg Hx   .  Malignant hyperthermia Neg Hx   . Pseudochol deficiency Neg Hx      Current Outpatient Prescriptions:  .  ALPRAZolam (XANAX) 1 MG tablet, Take 1 tablet (1 mg total) by mouth 3 (three) times daily., Disp: 90 tablet, Rfl: 5 .  blood glucose meter kit and supplies, Dispense based on patient and insurance preference. Test once a day E11.9, Disp: 1 each, Rfl: 0 .  enalapril (VASOTEC) 20 MG tablet, Take 1 tablet (20 mg total) by mouth at bedtime., Disp: 30 tablet, Rfl: 5 .  furosemide (LASIX) 20 MG tablet, Take 0.5 tablets (10 mg total) by mouth daily., Disp: 30 tablet, Rfl: 5 .  levothyroxine (SYNTHROID, LEVOTHROID) 50 MCG tablet, TAKE 1 TABLET (50 MCG TOTAL) BY MOUTH DAILY., Disp: 30 tablet, Rfl: 5 .  linaclotide (LINZESS) 145 MCG CAPS capsule, Take 1 capsule (145 mcg total) by mouth daily before breakfast., Disp: 30 capsule, Rfl: 5 .  lovastatin (MEVACOR) 40 MG  tablet, TAKE 1 TABLET (40 MG TOTAL) BY MOUTH AT BEDTIME., Disp: 90 tablet, Rfl: 1 .  ONE TOUCH ULTRA TEST test strip, USE TO TEST ONCE DAILY, Disp: 50 each, Rfl: 5 .  pantoprazole (PROTONIX) 40 MG tablet, Take 40 mg by mouth daily., Disp: , Rfl: 5 .  verapamil (CALAN-SR) 240 MG CR tablet, Take 1 tablet (240 mg total) by mouth daily., Disp: 30 tablet, Rfl: 5 .  etodolac (LODINE) 200 MG capsule, Take 1 capsule (200 mg total) by mouth every 8 (eight) hours. (Patient not taking: Reported on 08/17/2016), Disp: 12 capsule, Rfl: 0 .  oxybutynin (DITROPAN-XL) 10 MG 24 hr tablet, Take 1 tablet (10 mg total) by mouth at bedtime., Disp: 30 tablet, Rfl: 11 .  traMADol (ULTRAM) 50 MG tablet, Take 1 tablet (50 mg total) by mouth every 6 (six) hours as needed. (Patient not taking: Reported on 08/22/2016), Disp: 12 tablet, Rfl: 0  Review of Systems  Review of Systems  Constitutional: Negative for fever, chills, weight loss, malaise/fatigue and diaphoresis.  HENT: Negative for hearing loss, ear pain, nosebleeds, congestion, sore throat, neck pain, tinnitus and ear discharge.   Eyes: Negative for blurred vision, double vision, photophobia, pain, discharge and redness.  Respiratory: Negative for cough, hemoptysis, sputum production, shortness of breath, wheezing and stridor.   Cardiovascular: Negative for chest pain, palpitations, orthopnea, claudication, leg swelling and PND.  Gastrointestinal: negative for abdominal pain. Negative for heartburn, nausea, vomiting, diarrhea, constipation, blood in stool and melena.  Genitourinary: Negative for dysuria, urgency, frequency, hematuria and flank pain.  Musculoskeletal: Negative for myalgias, back pain, joint pain and falls.  Skin: Negative for itching and rash.  Neurological: Negative for dizziness, tingling, tremors, sensory change, speech change, focal weakness, seizures, loss of consciousness, weakness and headaches.  Endo/Heme/Allergies: Negative for environmental  allergies and polydipsia. Does not bruise/bleed easily.  Psychiatric/Behavioral: Negative for depression, suicidal ideas, hallucinations, memory loss and substance abuse. The patient is not nervous/anxious and does not have insomnia.        Objective:  Blood pressure 122/74, pulse 86, height _0  (1.651 m), weight 202 lb (91.6 kg).   Physical Exam  Vitals reviewed. Constitutional: She is oriented to person, place, and time. She appears well-developed and well-nourished.  HENT:  Head: Normocephalic and atraumatic.        Right Ear: External ear normal.  Left Ear: External ear normal.  Nose: Nose normal.  Mouth/Throat: Oropharynx is clear and moist.  Eyes: Conjunctivae and EOM are normal. Pupils are equal, round, and reactive  to light. Right eye exhibits no discharge. Left eye exhibits no discharge. No scleral icterus.  Neck: Normal range of motion. Neck supple. No tracheal deviation present. No thyromegaly present.  Cardiovascular: Normal rate, regular rhythm, normal heart sounds and intact distal pulses.  Exam reveals no gallop and no friction rub.   No murmur heard. Respiratory: Effort normal and breath sounds normal. No respiratory distress. She has no wheezes. She has no rales. She exhibits no tenderness.  GI: Soft. Bowel sounds are normal. She exhibits no distension and no mass. There is no tenderness. There is no rebound and no guarding.  Genitourinary:  Breasts no masses skin changes or nipple changes bilaterally      Vulva is normal without lesions Vagina is pink moist without discharge Cervix normal in appearance and pap is done Uterus is normal size shape and contour Adnexa is negative with normal sized ovaries  {Rectal    hemoccult negative, normal tone, no masses  Musculoskeletal: Normal range of motion. She exhibits no edema and no tenderness.  Neurological: She is alert and oriented to person, place, and time. She has normal reflexes. She displays normal reflexes. No  cranial nerve deficit. She exhibits normal muscle tone. Coordination normal.  Skin: Skin is warm and dry. No rash noted. No erythema. No pallor.  Psychiatric: She has a normal mood and affect. Her behavior is normal. Judgment and thought content normal.       Medications Ordered at today's visit: Meds ordered this encounter  Medications  . oxybutynin (DITROPAN-XL) 10 MG 24 hr tablet    Sig: Take 1 tablet (10 mg total) by mouth at bedtime.    Dispense:  30 tablet    Refill:  11    Other orders placed at today's visit: No orders of the defined types were placed in this encounter.     Assessment:    Healthy female exam.   urge incontinence, detrussor instability Plan:    Mammogram ordered. Follow up in: 2 years. I will begin patient on a course of did trepan XL 10 mg at bedtime   Noami does work third shift so bedtime for her is a little different but she will take it before she goes to bed Because of recent insurance restrictions this is the medication we will need to start with and if she does not respond we will proceed with a vesica care and Myrbetriq  Return in about 2 years (around 09/04/2018) for yearly, Follow up.

## 2016-09-04 LAB — CYTOLOGY - PAP
DIAGNOSIS: NEGATIVE
HPV: NOT DETECTED

## 2016-09-07 DIAGNOSIS — E210 Primary hyperparathyroidism: Secondary | ICD-10-CM | POA: Diagnosis not present

## 2016-09-13 ENCOUNTER — Telehealth (HOSPITAL_COMMUNITY): Payer: Self-pay | Admitting: Physical Therapy

## 2016-09-13 NOTE — Telephone Encounter (Signed)
Pt came by to schedule worker's comp PT Eval. I called the adjustor: Seth Bake at [facility] to see if we were in-network. She informed me that the pt has no choice but to go where they are sending her. NF 09/13/16

## 2016-09-17 ENCOUNTER — Ambulatory Visit: Payer: BLUE CROSS/BLUE SHIELD | Admitting: Medical

## 2016-09-17 VITALS — BP 122/78 | HR 67 | Temp 97.3°F

## 2016-09-17 DIAGNOSIS — Z026 Encounter for examination for insurance purposes: Secondary | ICD-10-CM

## 2016-09-17 DIAGNOSIS — M25562 Pain in left knee: Secondary | ICD-10-CM

## 2016-09-18 NOTE — Progress Notes (Signed)
   Subjective:    Patient ID: Kaitlin [name], female    DOB: Oct 13, 1955, 61 y.o.   MRN: [id number]  HPI  61 yo female returns to [place] after appointment with Dr. Harlow Mares from [facility]. 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  Patient fell 08/17/16.  Her right knee is doing well and she is out of her brace. Her left knee remains in a brace , she still is swollen and has pain with standing long hours. She is attending physical therapy per Dr. Harlow Mares. Previous x-rays  in the Emergency Department had been done no fracture or dislocation. Review of Systems  Constitutional: Negative for chills and fever.  Respiratory: Negative for cough.   Musculoskeletal: Positive for joint swelling.  Skin: Positive for color change.       Objective:   Physical Exam  Constitutional: She is oriented to person, place, and time. She appears well-developed and well-nourished.  HENT:  Head: Normocephalic and atraumatic.  Eyes: Pupils are equal, round, and reactive to light. Conjunctivae and EOM are normal.  Neck: Normal range of motion. Neck supple.  Musculoskeletal: Normal range of motion. She exhibits edema and tenderness.  Neurological: She is alert and oriented to person, place, and time.  Skin: Skin is warm and dry.  Psychiatric: She has a normal mood and affect. Her behavior is normal. Judgment and thought content normal.  Nursing note and vitals reviewed.  Had patient remove brace from left knee. , still swollen above and below patella, some bruising noted. Able to stand, and flex and extend knee . 2+ PT pulses. Brace replaced on patients left knee.       Assessment & Plan:  Knee pain Left. Sprain of medial collateral   Ligaments, primary osteoarthrtis. Pending approval for an MRI. Currently doing physical therapy. Patient brought copy of her work release with restrictions : May do  8 hour shifts only with wearing knee brace. No climbing, crawling or kneeling. Restrictions in place till next visit  with [facility] 10/02/16. Copy of note in chart.

## 2016-09-20 ENCOUNTER — Telehealth: Payer: Self-pay | Admitting: Internal Medicine

## 2016-09-20 MED ORDER — PANTOPRAZOLE SODIUM 40 MG PO TBEC: 40.0000 mg | DELAYED_RELEASE_TABLET | Freq: Every day | ORAL | 5 refills | Status: DC

## 2016-09-20 NOTE — Telephone Encounter (Signed)
Called and informed pt.  

## 2016-09-20 NOTE — Telephone Encounter (Signed)
Routing to refill box  

## 2016-09-20 NOTE — Telephone Encounter (Signed)
Pt has OV with RMR on 8/7 and said that she is out of her pantoprazole and needed a prescription called in today at [facility] in [place], She works 3rd shift and can be reached at [phone]

## 2016-09-20 NOTE — Telephone Encounter (Signed)
Please tell the patient the Rx was sent to her pharmacy per her request.

## 2016-09-27 ENCOUNTER — Ambulatory Visit (INDEPENDENT_AMBULATORY_CARE_PROVIDER_SITE_OTHER): Payer: BLUE CROSS/BLUE SHIELD | Admitting: Otolaryngology

## 2016-09-27 ENCOUNTER — Ambulatory Visit (INDEPENDENT_AMBULATORY_CARE_PROVIDER_SITE_OTHER): Payer: BLUE CROSS/BLUE SHIELD | Admitting: Family Medicine

## 2016-09-27 ENCOUNTER — Encounter: Payer: Self-pay | Admitting: Family Medicine

## 2016-09-27 ENCOUNTER — Telehealth: Payer: Self-pay | Admitting: Adult Health

## 2016-09-27 VITALS — BP 150/78 | Temp 97.6°F | Ht 65.0 in | Wt 202.1 lb

## 2016-09-27 DIAGNOSIS — F411 Generalized anxiety disorder: Secondary | ICD-10-CM | POA: Diagnosis not present

## 2016-09-27 DIAGNOSIS — J310 Chronic rhinitis: Secondary | ICD-10-CM

## 2016-09-27 DIAGNOSIS — I10 Essential (primary) hypertension: Secondary | ICD-10-CM

## 2016-09-27 DIAGNOSIS — G4489 Other headache syndrome: Secondary | ICD-10-CM

## 2016-09-27 DIAGNOSIS — J343 Hypertrophy of nasal turbinates: Secondary | ICD-10-CM

## 2016-09-27 MED ORDER — VERAPAMIL HCL ER 360 MG PO CP24: 360.0000 mg | ORAL_CAPSULE | Freq: Every day | ORAL | 1 refills | Status: DC

## 2016-09-27 MED ORDER — LOVASTATIN 40 MG PO TABS: ORAL_TABLET | ORAL | 1 refills | Status: DC

## 2016-09-27 NOTE — Telephone Encounter (Signed)
Patient called on 09/27/16 and wants to report that she has had a mild headache intermittently since Friday 09/21/16 and she is questioning rather this could be related to her fall on 09/15/16. The headache has resolved and returns at sporadic times. Headache is not consistent. She denies any new trauma, falls or injury. Speech is clear on phone. She denies any slurred speech, facial changes, numbness or  tingling or any other symptoms. She denies nausea, vomiting, diarrhea, dizziness,  fever or chills. 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 She is able to eat. Review of systems negative by patient report other than " mild headache " currently.  She is alert and oriented to person place and time on telephone with provider.   She denies taking any over the counter medicine until today she has tried Ibuprofen with some relief. She rates highest pain has been a 6/10 on pain scale and " it is much less than this presently".  She reports she tried to call her case manager this morning to report these symptoms..  She also reports she has a history of nose bleeds and she has had two nose bleeds in the past two weeks that stopped easily. She is unable to come to clinic today 09/24/16 due to an appointment with [facility] MD in [place] at 1 pm today.  Patient advised to report symptoms to MD at [facility] appointment today and follow his advice for treatment. Advised she can be evaluated in office today but unable to come today due to scheduled appointment. Patient will call and schedule an appointment with office for 09/28/16  Friday or Monday 10/01/16 for evaluation.  Patient asking if CT scan will be covered by workers compensation due to  fall 09/15/16 provider advised to reach out to case manager and provider is unable to make those decisions. Patient reports she has left message for case manager.   Patient is advised to be seen in the Emergency Room for any changing,  worsening symptoms if after hours or emergency Symptoms arise call 911. Patient verbalized understanding of all of the above.

## 2016-09-27 NOTE — Progress Notes (Signed)
   Subjective:    Patient ID: Kaitlin [name], female    DOB: [birth year], 61 y.o.   MRN: [id number]  Headache   This is a new problem. The current episode started in the past 7 days. The problem occurs intermittently. The pain is located in the temporal region. The pain radiates to the right neck. The pain quality is not similar to prior headaches. The quality of the pain is described as throbbing. Nothing aggravates the symptoms. She has tried NSAIDs for the symptoms. The treatment provided mild relief.    Patient states she fell at work on June 13,2018 hit her forehead. She is worried that she may have cause some damage . She states she has been under a lot of stress lately.   Took hard fall on sidewalk broke glasses, now in phys theapy for the knees  Increased stress lately mo has stroke  Having ot work third shift  Anxiety recently go higher, already takes 1 mg Xanax 3 times a day for  [place] forward. Glasses broke in the meantime. Her main injury was her arms and legs. Specifically her knees. Has been to the orthopedist. Has had injections. Still wearing a brace.  Pt had leg brace and crutches initially, got injec in the knee, then used braces  Went bk to work with some restriction  Review of Systems  Neurological: Positive for headaches.       Objective:   Physical Exam Alert and oriented, Blood pressure persists and elevated on repeat [id number] vitals reviewed and stable, NAD ENT-TM's and ext canals WNL bilat via otoscopic exam Soft palate, tonsils and post pharynx WNL via oropharyngeal exam Neck-symmetric, no masses; thyroid nonpalpable and nontender Pulmonary-no tachypnea or accessory muscle use; Clear without wheezes via auscultation Card--no abnrml murmurs, rhythm reg and rate WNL Carotid pulses symmetric, without bruits        Assessment & Plan:  Impression 1 hypertension suboptimal in discussed need to increase medication #2 stress/anxiety. Patient declines  antidepressants. #3 headaches very nonspecific occurring like this weeks after the injury with no major facial trauma highly doubt intracranial process. Discussed. Plan increase verapamil to 360 long-acting rationale discussed salt intake discussed  Greater than 50% of this 25 minute face to face visit was spent in counseling and discussion and coordination of care regarding the above diagnosis/diagnosies

## 2016-09-27 NOTE — Telephone Encounter (Signed)
Patient called clinic 09/27/16 at 2:00pm she reports she saw her ENT today and he wants ger to see her primary care MD today and she has an appointment this afternoon. She will call back as needed. Keep same instructions in previous phone call from earlier today. Patient verbalized understanding.

## 2016-09-27 NOTE — Telephone Encounter (Signed)
Patient was also advised to use [facility] if ENT orders any imaging and if she would like to receive [employer]'s insurance  contact price with her insurance. She is able to use any facility as well. Patient verbalized understanding of all of the above.

## 2016-09-28 ENCOUNTER — Telehealth: Payer: Self-pay | Admitting: Adult Health

## 2016-09-28 ENCOUNTER — Ambulatory Visit
Admission: RE | Admit: 2016-09-28 | Discharge: 2016-09-28 | Disposition: A | Discharge status: Home / Self Care | Payer: BLUE CROSS/BLUE SHIELD | Source: Ambulatory Visit | Attending: Adult Health | Admitting: Adult Health

## 2016-09-28 ENCOUNTER — Ambulatory Visit: Payer: BLUE CROSS/BLUE SHIELD | Admitting: Adult Health

## 2016-09-28 DIAGNOSIS — M542 Cervicalgia: Secondary | ICD-10-CM

## 2016-09-28 DIAGNOSIS — G4459 Other complicated headache syndrome: Secondary | ICD-10-CM | POA: Insufficient documentation

## 2016-09-28 DIAGNOSIS — R51 Headache: Secondary | ICD-10-CM | POA: Diagnosis not present

## 2016-09-28 DIAGNOSIS — S199XXA Unspecified injury of neck, initial encounter: Secondary | ICD-10-CM | POA: Diagnosis not present

## 2016-09-28 IMAGING — CT CT HEAD W/O CM
3 series · 16 of 47 positions shown, 19 images · non-contrast
Comparison: [DATE]

CLINICAL DATA: Recent fall with headaches, initial encounter

EXAM:
CT HEAD WITHOUT CONTRAST
TECHNIQUE: Contiguous axial images were obtained from the base of the skull
through the vertex without intravenous contrast.

[Series 2: head wo · axial · 0.43mm/px · z∈[-5,+120]mm · 10 of 30 slices shown, 13 images]
[im 3/30  brain]
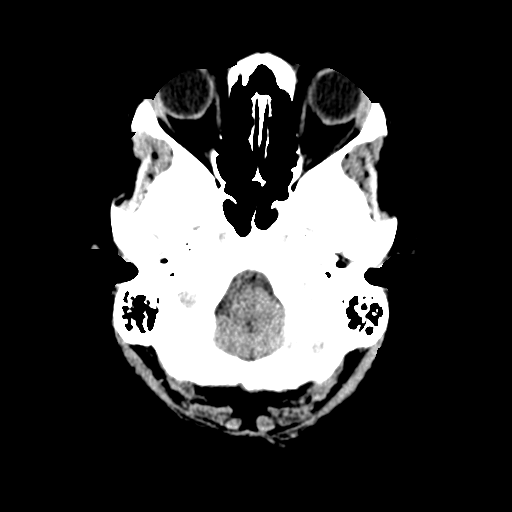
[im 3/30  bone]
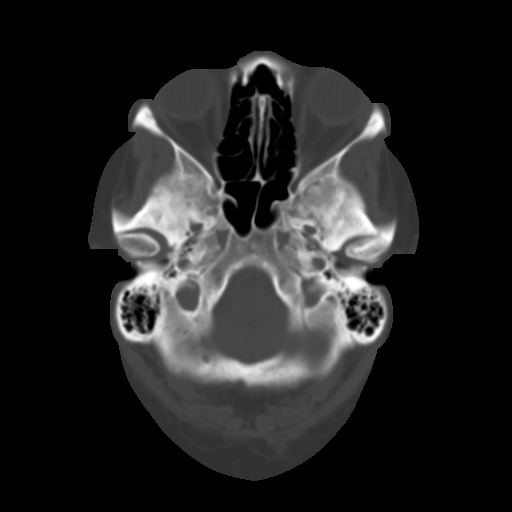
[im 6/30  brain]
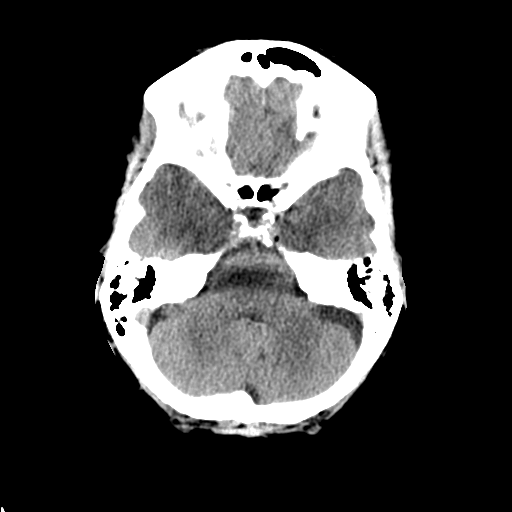
[im 9/30  brain]
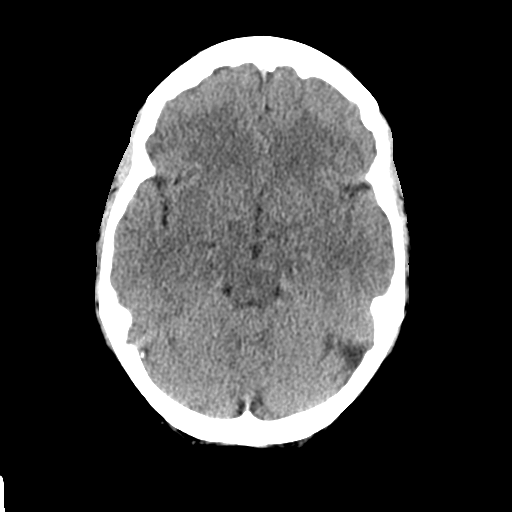
[im 11/30  brain]
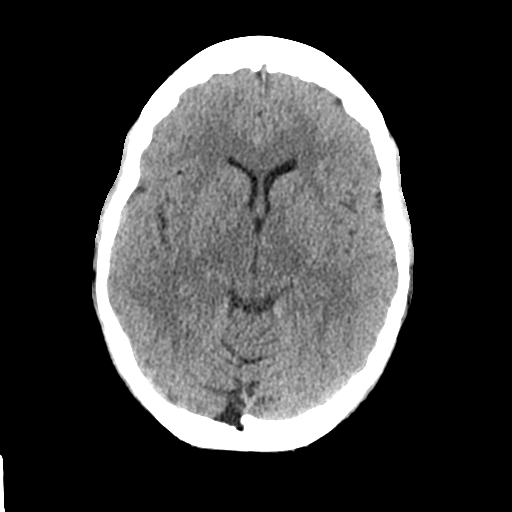
[im 14/30  brain]
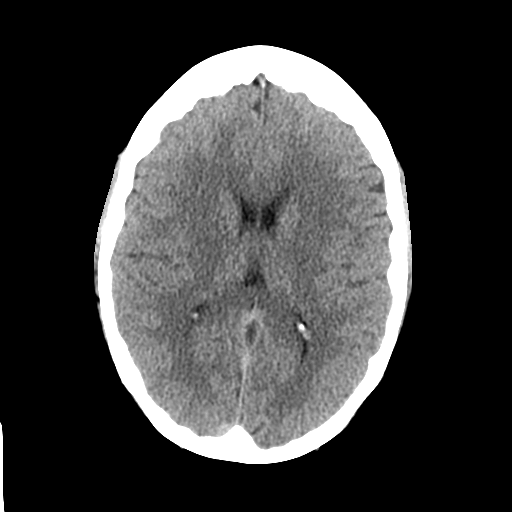
[im 14/30  bone]
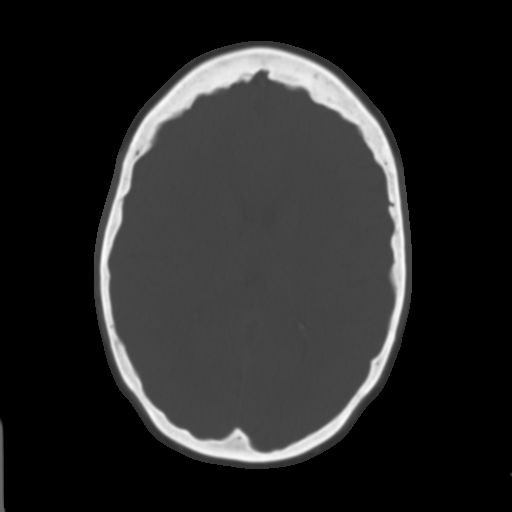
[im 17/30  brain]
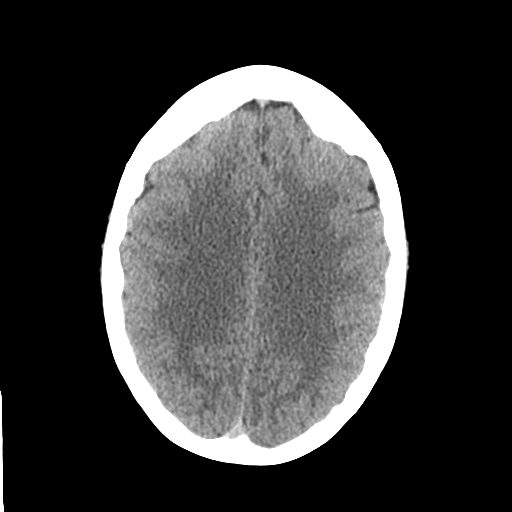
[im 20/30  brain]
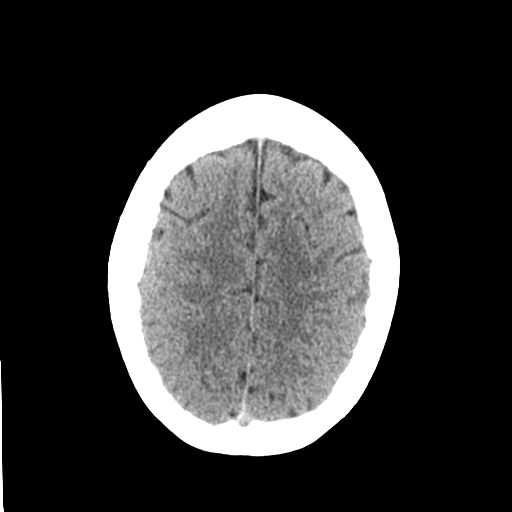
[im 23/30  brain]
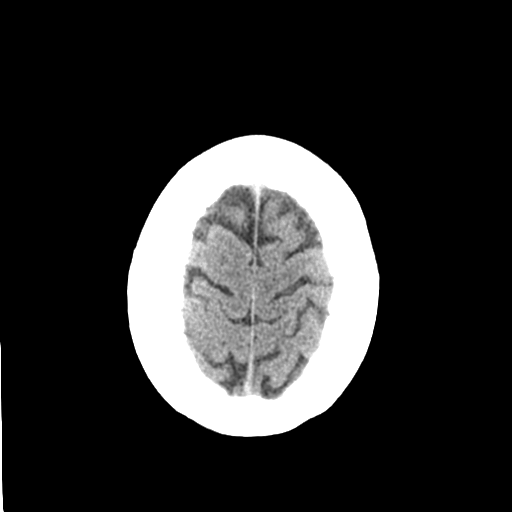
[im 25/30  brain]
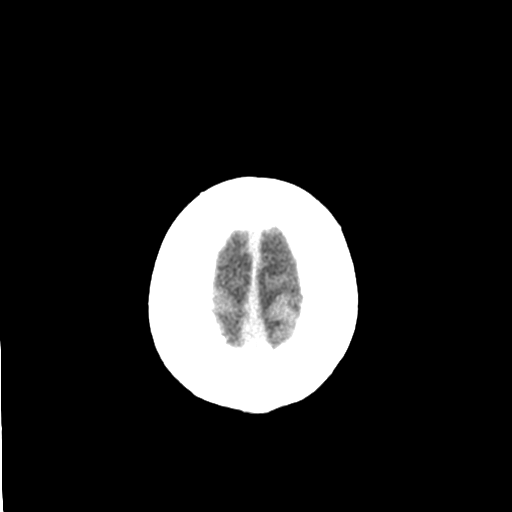
[im 25/30  bone]
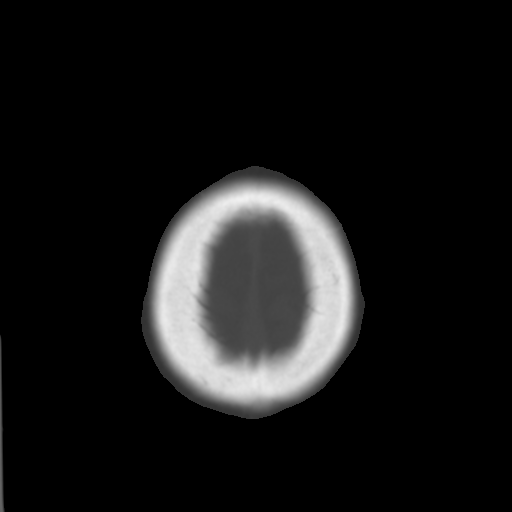
[im 28/30  brain]
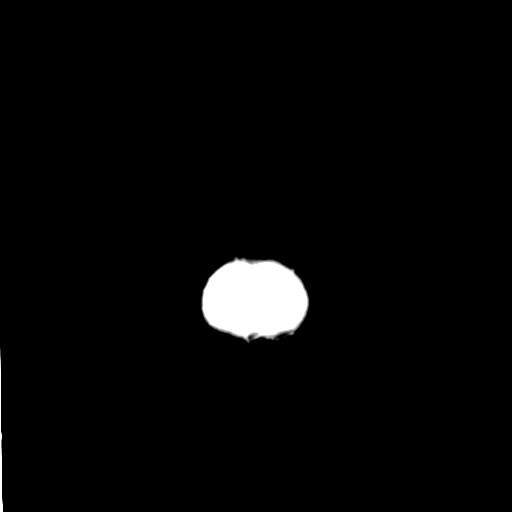

[Series 4: coronal soft tissue · coronal · 0.29mm/px · 3 of 63 slices shown]
[im 21/63  brain]
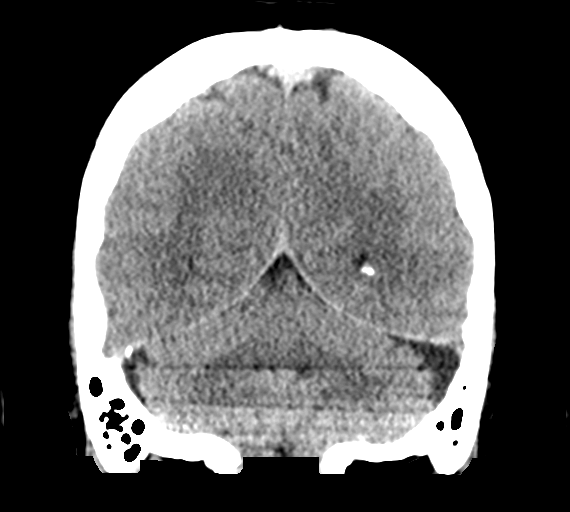
[im 28/63  brain]
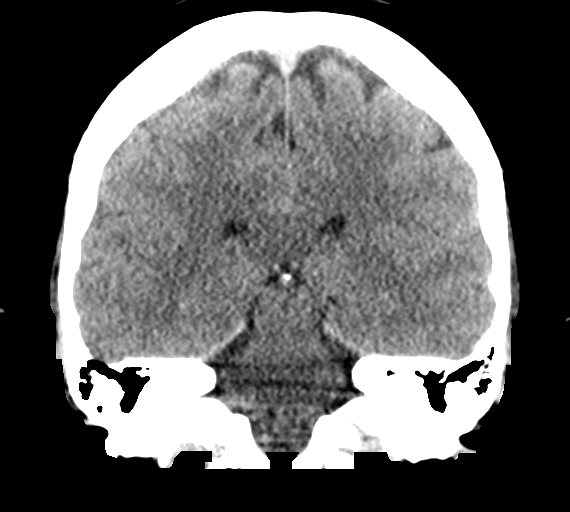
[im 35/63  brain]
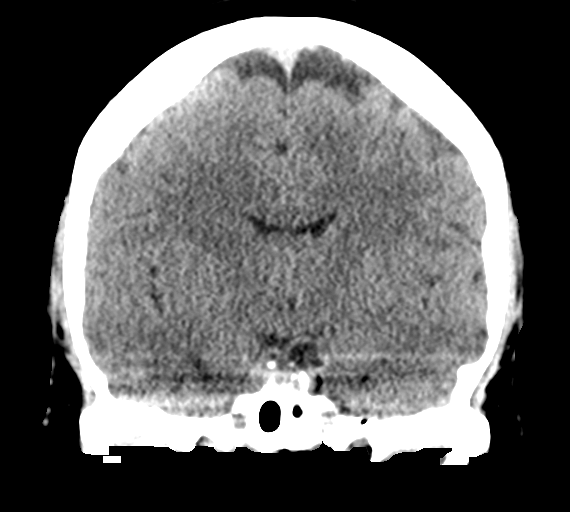

[Series 5: sagittal soft tissue · sagittal · 0.30mm/px · 3 of 49 slices shown]
[im 17/49  brain]
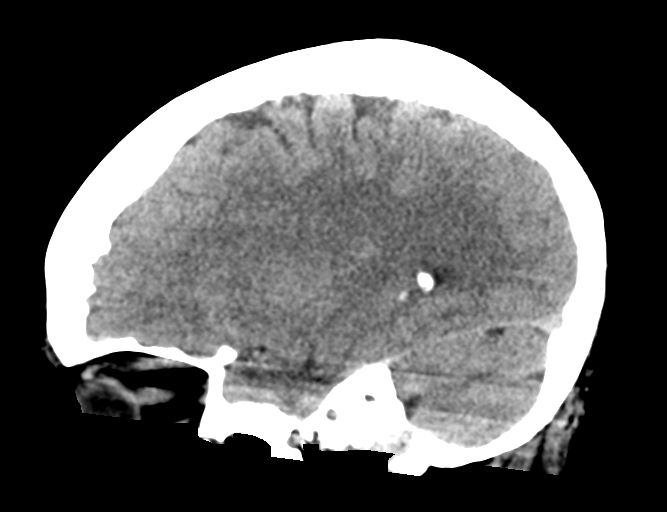
[im 25/49  brain]
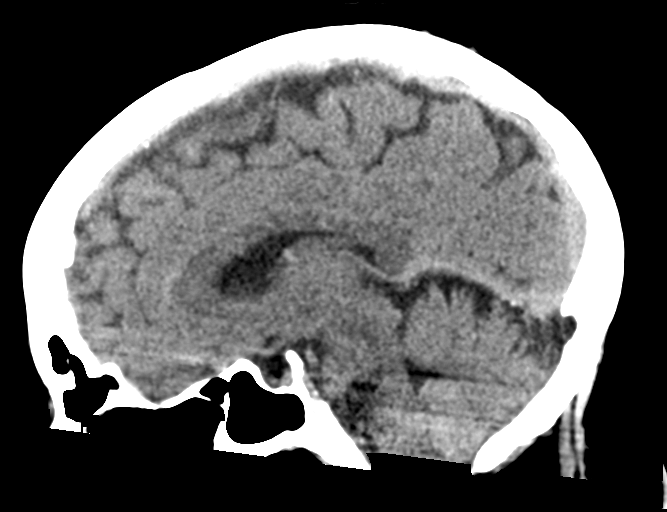
[im 33/49  brain]
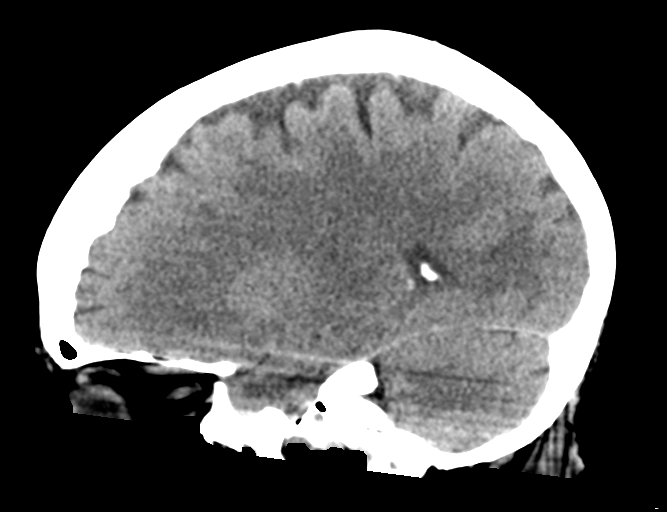

[16 of 47 positions shown; findings below may reference images not displayed]

FINDINGS: Brain: No evidence of acute infarction, hemorrhage, hydrocephalus,
extra-axial collection or mass lesion/mass effect.

Vascular: No hyperdense vessel or unexpected calcification.

Skull: Normal. Negative for fracture or focal lesion.

Sinuses/Orbits: No acute finding.

Other: None.
IMPRESSION: No acute intracranial abnormality noted.

## 2016-09-28 IMAGING — CR DG CERVICAL SPINE 2 OR 3 VIEWS
1 series · 5 of 5 positions shown · non-contrast
Comparison: None.

CLINICAL DATA: Neck pain after a fall.

EXAM:
CERVICAL SPINE - 2-3 VIEW

[Series 1: dg cervical spine 2 or 3 views · 0.14mm/px · 5 of 5 slices shown]
[im 1/5]
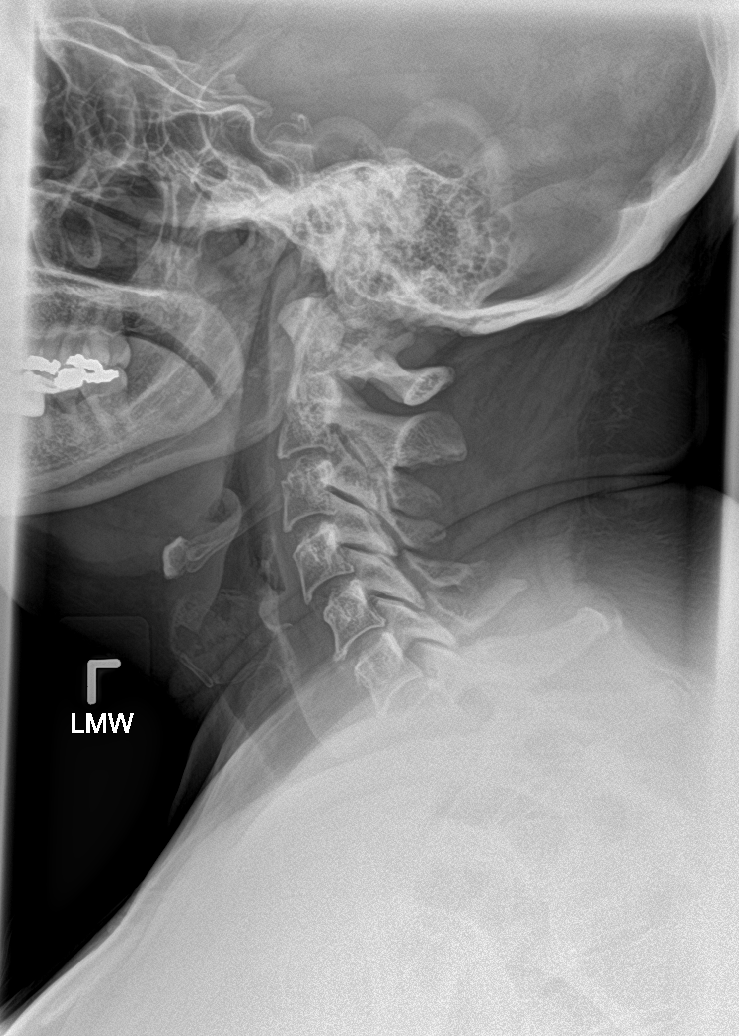
[im 2/5]
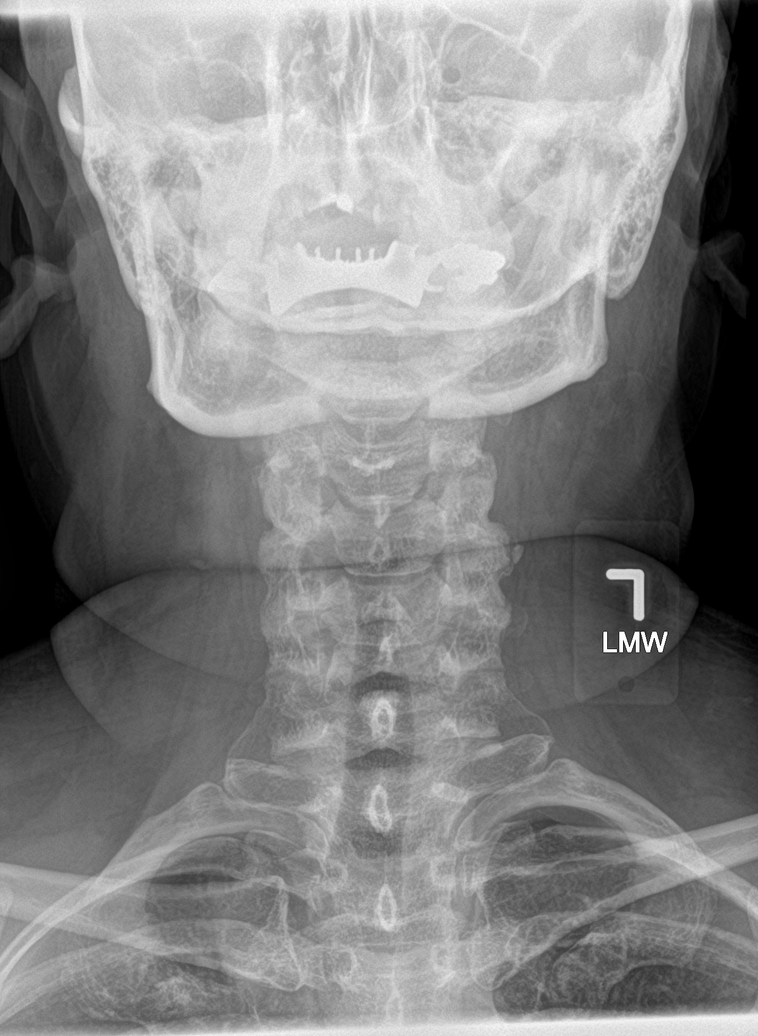
[im 3/5]
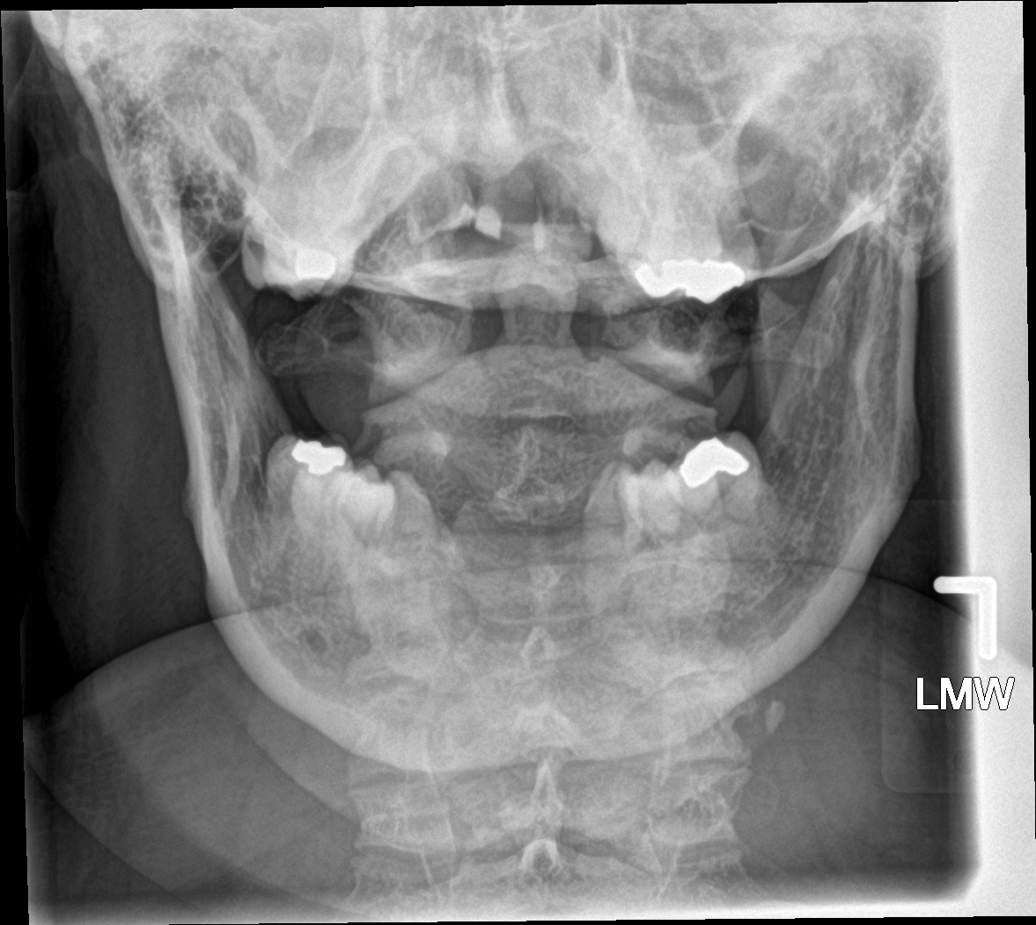
[im 4/5]
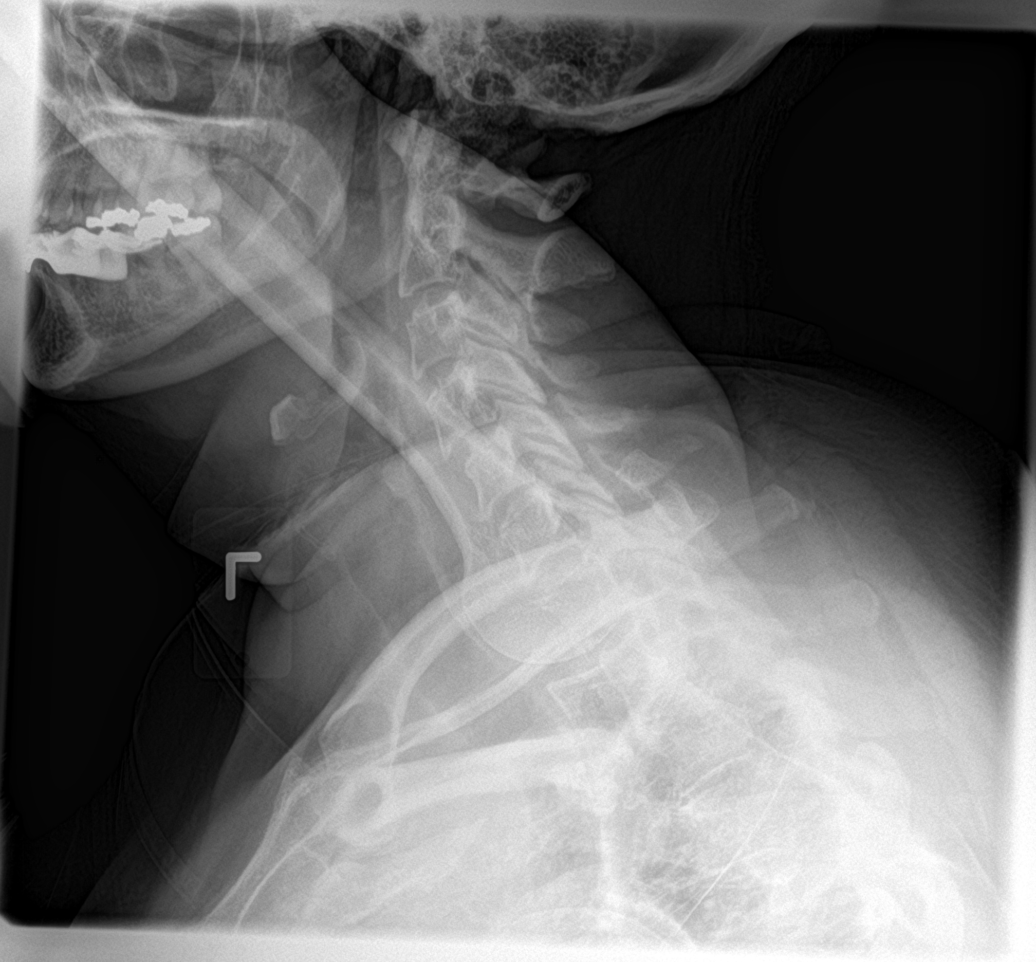
[im 5/5]
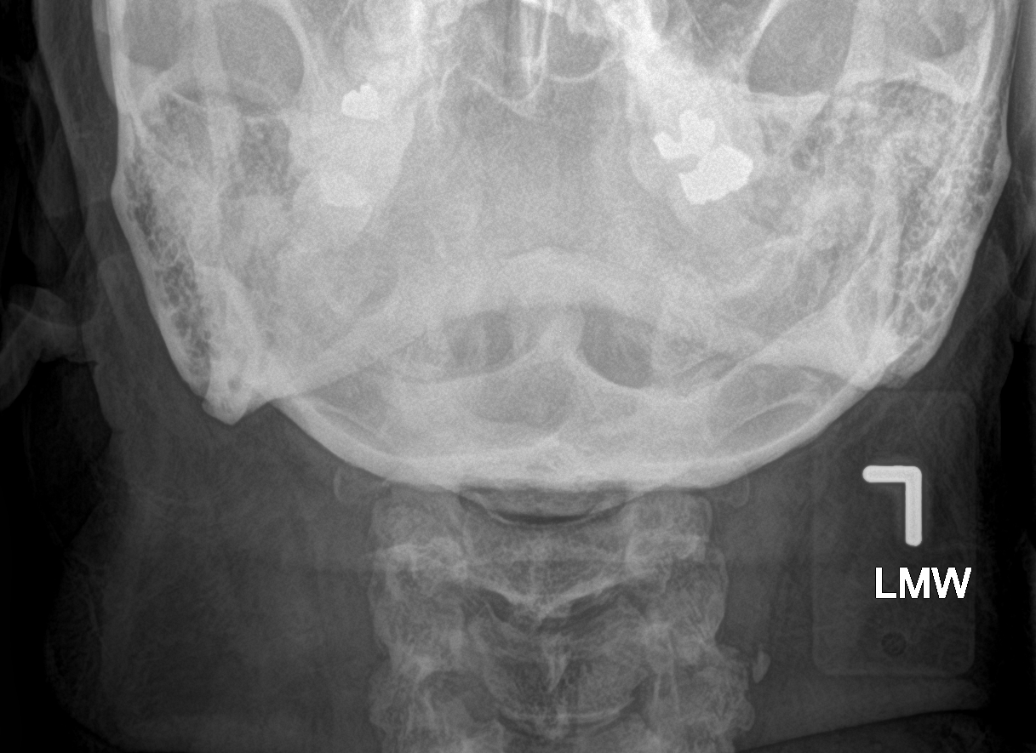

[5 of 5 positions shown; findings below may reference images not displayed]

FINDINGS: There is no evidence of cervical spine fracture or prevertebral soft
tissue swelling. Alignment is normal. No other significant bone
abnormalities are identified.
IMPRESSION: Negative cervical spine radiographs.

## 2016-09-28 NOTE — Progress Notes (Signed)
Patient was advised no acute abnormalities ob Cervical 2-3 view spine or CT of HEAD WITHOUT CONTRAST. See note in Epic. She will follow up on 10/01/16

## 2016-09-28 NOTE — Patient Instructions (Signed)
Hypertension Hypertension is another name for high blood pressure. High blood pressure forces your heart to work harder to pump blood. This can cause problems over time. There are two numbers in a blood pressure reading. There is a top number (systolic) over a bottom number (diastolic). It is best to have a blood pressure below 120/80. Healthy choices can help lower your blood pressure. You may need medicine to help lower your blood pressure if:  Your blood pressure cannot be lowered with healthy choices.  Your blood pressure is higher than 130/80.  Follow these instructions at home: Eating and drinking  If directed, follow the DASH eating plan. This diet includes: ? Filling half of your plate at each meal with fruits and vegetables. ? Filling one quarter of your plate at each meal with whole grains. Whole grains include whole wheat pasta, brown rice, and whole grain bread. ? Eating or drinking low-fat dairy products, such as skim milk or low-fat yogurt. ? Filling one quarter of your plate at each meal with low-fat (lean) proteins. Low-fat proteins include fish, skinless chicken, eggs, beans, and tofu. ? Avoiding fatty meat, cured and processed meat, or chicken with skin. ? Avoiding premade or processed food.  Eat less than 1,500 mg of salt (sodium) a day.  Limit alcohol use to no more than 1 drink a day for nonpregnant women and 2 drinks a day for men. One drink equals 12 oz of beer, 5 oz of wine, or 1 oz of hard liquor. Lifestyle  Work with your doctor to stay at a healthy weight or to lose weight. Ask your doctor what the best weight is for you.  Get at least 30 minutes of exercise that causes your heart to beat faster (aerobic exercise) most days of the week. This may include walking, swimming, or biking.  Get at least 30 minutes of exercise that strengthens your muscles (resistance exercise) at least 3 days a week. This may include lifting weights or pilates.  Do not use any  products that contain nicotine or tobacco. This includes cigarettes and e-cigarettes. If you need help quitting, ask your doctor.  Check your blood pressure at home as told by your doctor.  Keep all follow-up visits as told by your doctor. This is important. Medicines  Take over-the-counter and prescription medicines only as told by your doctor. Follow directions carefully.  Do not skip doses of blood pressure medicine. The medicine does not work as well if you skip doses. Skipping doses also puts you at risk for problems.  Ask your doctor about side effects or reactions to medicines that you should watch for. Contact a doctor if:  You think you are having a reaction to the medicine you are taking.  You have headaches that keep coming back (recurring).  You feel dizzy.  You have swelling in your ankles.  You have trouble with your vision. Get help right away if:  You get a very bad headache.  You start to feel confused.  You feel weak or numb.  You feel faint.  You get very bad pain in your: ? Chest. ? Belly (abdomen).  You throw up (vomit) more than once.  You have trouble breathing. Summary  Hypertension is another name for high blood pressure.  Making healthy choices can help lower blood pressure. If your blood pressure cannot be controlled with healthy choices, you may need to take medicine. This information is not intended to replace advice given to you by your health care   provider. Make sure you discuss any questions you have with your health care provider. Document Released: 08/08/2007 Document Revised: 01/18/2016 Document Reviewed: 01/18/2016 Elsevier Interactive Patient Education  2018 [place] Headache Without Cause A headache is pain or discomfort felt around the head or neck area. There are many causes and types of headaches. In some cases, the cause may not be found. Follow these instructions at home: Managing pain  Take over-the-counter  and prescription medicines only as told by your doctor.  Lie down in a dark, quiet room when you have a headache.  If directed, apply ice to the head and neck area: ? Put ice in a plastic bag. ? Place a towel between your skin and the bag. ? Leave the ice on for 20 minutes, 2-3 times per day.  Use a heating pad or hot shower to apply heat to the head and neck area as told by your doctor.  Keep lights dim if bright lights bother you or make your headaches worse. Eating and drinking  Eat meals on a regular schedule.  Lessen how much alcohol you drink.  Lessen how much caffeine you drink, or stop drinking caffeine. General instructions  Keep all follow-up visits as told by your doctor. This is important.  Keep a journal to find out if certain things bring on headaches. For example, write down: ? What you eat and drink. ? How much sleep you get. ? Any change to your diet or medicines.  Relax by getting a massage or doing other relaxing activities.  Lessen stress.  Sit up straight. Do not tighten (tense) your muscles.  Do not use tobacco products. This includes cigarettes, chewing tobacco, or e-cigarettes. If you need help quitting, ask your doctor.  Exercise regularly as told by your doctor.  Get enough sleep. This often means 7-9 hours of sleep. Contact a doctor if:  Your symptoms are not helped by medicine.  You have a headache that feels different than the other headaches.  You feel sick to your stomach (nauseous) or you throw up (vomit).  You have a fever. Get help right away if:  Your headache becomes really bad.  You keep throwing up.  You have a stiff neck.  You have trouble seeing.  You have trouble speaking.  You have pain in the eye or ear.  Your muscles are weak or you lose muscle control.  You lose your balance or have trouble walking.  You feel like you will pass out (faint) or you pass out.  You have confusion. This information is not  intended to replace advice given to you by your health care provider. Make sure you discuss any questions you have with your health care provider. Document Released: 11/29/2007 Document Revised: 07/28/2015 Document Reviewed: 06/14/2014 Elsevier Interactive Patient Education  [employer].

## 2016-09-28 NOTE — Telephone Encounter (Signed)
Patient called with results of  CT SCAN of head without contrast as well as Results of Neck X - Ray and results were given no acute abnormalities. Patient denies any new symptoms since previous office visit and denies any further confusion. She is to follow up in clinic on 10/01/16 with Liz Malady PA. She was written out of work until 10/01/16 and will return Monday for  further evaluation and instructions. Patinet advised emergency room/ urgent care if any further confusion or any new symptoms arise at any time. Patient verbalized understanding.

## 2016-09-28 NOTE — Progress Notes (Signed)
Subjective:     Patient ID: Kaitlin [name], female   DOB: 1955/12/29, 61 y.o.   MRN: [id number]  HPI  Patient is a 61 year old female in no acute distress who presents to the clinic with complaints of a   headache since last Wednesday 09/19/16  She reports increased sleepiness, Headache was " moving" all over her head and would not stay in one area per patient. She reports she used an ice pack with some relief.  She reports a nagging pain in her head yesterday.   B/P was elevated yesterday at PCP Luking MD and was 158/99 per her report.  Verapamil 300 mg daily  Was increased by Dr. Ethelene Browns PCP per patient reports. She also reports he talked with her about  her stress level. She has not started this new dose today and reports that she still has to pick it up at pharmacy.   She saw ENT was nasal congestion yesterday.She was prescribed a nasal spray for allergies.   Patient also  reports she did fall on her  forehead on 08/16/16 and she reports she did hit her forhead on sidewalk. She reports she broke her glasses with fall and has since had those repaired. She reports she did try to put her paper towels in the refrigerator yesterday and this is not usual for her. She denies any other strange activity today per her report. She denies any other confusion, nausea, vomiting, vision changes or any other symptoms associated with her headache. She also reports neck stiffness since her fall on 08/16/16 at work. She reports this incident  could be because she is stressed. She reports her stress is related to hurting her knees with her fall at work. She was referred to [facility] for this and is being followed there. She denies any other ongoing stressors. She reports she discussed headache and neck stiffness with primary care and he advised to come to [facility] clinic due to workers compensation case.   Headache is currently  mild  And pain in her neck is described as stiff and sore. She denies any change in  range of motion and movement. She denies any fevers or recent illness. For her at this moment.She has taken blood pressure medicine and xanax this am and feels good otherwise per her report.   Vitals:   09/28/16 1003 09/28/16 1019  BP: 132/84 126/84  Pulse: 84   Temp: (!) 97.1 F (36.2 C)    Review of Systems  Constitutional: Negative.   HENT: Positive for rhinorrhea (seeing Ear Nose and Throat).   Eyes: Negative.   Respiratory: Negative for apnea, cough, choking, chest tightness, shortness of breath, wheezing and stridor.   Cardiovascular: Negative for chest pain, palpitations and leg swelling.  Gastrointestinal: Positive for nausea (nausea started 09/21/16 ).  Endocrine: Negative.   Genitourinary: Negative.   Musculoskeletal: Positive for arthralgias (left knee in physical therapy seeing ortho) and neck stiffness (decribed as stiffness ). Negative for neck pain.  Skin: Negative.   Allergic/Immunologic: Negative.   Neurological: Positive for light-headedness (mild if not getting up slow.) and headaches. Negative for dizziness, tremors, seizures, syncope, facial asymmetry, speech difficulty, weakness and numbness.  Hematological: Negative for adenopathy. Does not bruise/bleed easily.  Psychiatric/Behavioral: Negative for agitation, behavioral problems, confusion, decreased concentration, dysphoric mood, hallucinations, self-injury, sleep disturbance and suicidal ideas. The patient is not nervous/anxious and is not hyperactive.        Objective:   Physical Exam  Constitutional: She  is oriented to person, place, and time. She appears well-developed and well-nourished. No distress.  HENT:  Head: Normocephalic and atraumatic.  Right Ear: Hearing, tympanic membrane, external ear and ear canal normal.  Left Ear: Hearing, tympanic membrane, external ear and ear canal normal.  Nose: Nose normal.  Mouth/Throat: Oropharynx is clear and moist. No oropharyngeal exudate.  Eyes: Pupils are  equal, round, and reactive to light. Conjunctivae, EOM and lids are normal. Right eye exhibits no discharge. Left eye exhibits no discharge. No scleral icterus.  Neck: Trachea normal, normal range of motion and full passive range of motion without pain. Neck supple. Normal carotid pulses, no hepatojugular reflux and no JVD present. Spinous process tenderness (mild tenderness at C1 and C2/ denies any other tenderness. ) present. No tracheal tenderness and no muscular tenderness present. Carotid bruit is not present. No neck rigidity. No tracheal deviation, no edema, no erythema and normal range of motion present. No Brudzinski's sign and no Kernig's sign noted. No thyroid mass and no thyromegaly present.  Cardiovascular: Normal rate, regular rhythm, normal heart sounds and intact distal pulses.  Exam reveals no gallop and no friction rub.   No murmur heard. Pulmonary/Chest: Effort normal and breath sounds normal. No stridor. No respiratory distress. She has no wheezes. She has no rales. She exhibits no tenderness.  Abdominal: Soft. Bowel sounds are normal. She exhibits no distension and no mass. There is no tenderness. There is no rebound and no guarding.  Musculoskeletal: Normal range of motion. She exhibits no edema, tenderness or deformity.  Lymphadenopathy:    She has no cervical adenopathy.  Neurological: She is alert and oriented to person, place, and time. She has normal strength and normal reflexes. She displays normal reflexes. No cranial nerve deficit or sensory deficit. She exhibits normal muscle tone. She displays a negative Romberg sign. Coordination and gait normal. GCS eye subscore is 4. GCS verbal subscore is 5. GCS motor subscore is 6.  Skin: Skin is warm and dry. No rash noted. She is not diaphoretic. No erythema. No pallor.  Psychiatric: She has a normal mood and affect. Her speech is normal and behavior is normal. Judgment and thought content normal. Cognition and memory are normal.   Vitals reviewed.      Assessment:     1. Headache      Plan:     1. Out of work until 10/01/16 return to clinic for follow up.  2.CT Head without contrast and X-Ray of Neck 2 -3 view ordered and as documented results were called to patient and showed no acute abnormal findings. Patient offered [facility] due to contract for insurance/cost and discounted price, she prefers [facility] due to location at time and is aware of contract cost.  3. Patient advised to follow the advice of Dr. Wolfgang Phoenix for blood  Pressure and monitor readings daily or if symptoms at home. She is advised to go to the Emergency room at any time if any symptoms change or worsen call 911. Appointment scheduled for 10/01/16. patient verbalized all instructions above.

## 2016-10-01 ENCOUNTER — Telehealth: Payer: Self-pay | Admitting: *Deleted

## 2016-10-01 ENCOUNTER — Encounter: Payer: Self-pay | Admitting: Medical

## 2016-10-01 ENCOUNTER — Ambulatory Visit: Payer: BLUE CROSS/BLUE SHIELD | Admitting: Medical

## 2016-10-01 VITALS — BP 170/78 | HR 90 | Resp 16 | Ht 62.0 in | Wt 202.0 lb

## 2016-10-01 DIAGNOSIS — I10 Essential (primary) hypertension: Secondary | ICD-10-CM

## 2016-10-01 DIAGNOSIS — F439 Reaction to severe stress, unspecified: Secondary | ICD-10-CM

## 2016-10-01 DIAGNOSIS — M25562 Pain in left knee: Secondary | ICD-10-CM

## 2016-10-01 NOTE — Telephone Encounter (Signed)
Patient currently on Verapamil SR 360 cap and the pharmacy states this is no longer available and requests alternative

## 2016-10-01 NOTE — Progress Notes (Signed)
Subjective:    Patient ID: Kaitlin [name], female    DOB: [birth year], 61 y.o.   MRN: [id number]  HPI  Follow up on blood pressure,   Seen by  primary doctor on Thursday  blood pressure was elevated. Increased blood pressure medication to 360 mg  Verapamil . Took  240 mg today. When she went to pick it up the pharmacist needed to talk to her doctor due to dosing. Going to her pharmacy after visit here to pick up blood pressure medication.Also seen on Friday due to headache and neck pain, possibly workers compensation related  a CT of her head and neck x-ray was completed, which showed no abnormality and  Negative spinal radiographs respectively. Sees Dr.Bowers her  Orthopedic MD tomorrow for left knee. Denies Headache today or neck pain.  Seen Mother yesterday who recently had a stroke, seeing her having  trouble talking , and patient thought that was a little stressful. Also blood pressure may be up she says because her and her husband were not invited to a baby shower in the neighborhood and that got her a upset. She has been talking to her pastor about the situation. 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 Will come by the clinic tomorrow for a blood pressure recheck  after her visit to her orthopedic doctor. Hopefully with the increased medication started and to discuss her current worker compensation return to work.      Review of Systems  Constitutional: Positive for chills. Negative for fatigue and fever.  HENT: Negative for congestion, ear pain and sore throat.   Eyes: Negative for pain, discharge and itching.  Respiratory: Negative for cough and shortness of breath.   Cardiovascular: Negative for chest pain, palpitations and leg swelling.  Gastrointestinal: Negative for abdominal pain.  Endocrine: Negative for polydipsia, polyphagia and polyuria.  Genitourinary: Negative for dysuria.  Musculoskeletal: Negative for myalgias.  Skin: Negative  for rash.  Allergic/Immunologic: Positive for environmental allergies. Negative for food allergies.       Objective:   Physical Exam  Constitutional: She is oriented to person, place, and time. She appears well-developed and well-nourished.  HENT:  Head: Normocephalic and atraumatic.  Eyes: Pupils are equal, round, and reactive to light. Conjunctivae and EOM are normal.  Cardiovascular: Normal rate and regular rhythm.  Exam reveals no gallop and no friction rub.   No murmur heard. Pulmonary/Chest: Effort normal and breath sounds normal.  Neurological: She is alert and oriented to person, place, and time.  Skin: Skin is warm and dry.  Psychiatric: She has a normal mood and affect. Her behavior is normal. Judgment and thought content normal.  Nursing note and vitals reviewed.  FROM of knees bilaterally. Knee brace on left knee.       Assessment & Plan:  Hypertension  Blood pressure elevated higher than visit on Friday.was supposed to have increased her Verapamil form 240 mg to 360 mg , but there was a problem at the pharmacy. Left knee pain workers compensation, wears brace, just finished a physical therapy session prior to this appointment, patient says she is doing well in her therapy.Sees orthopedic doctor tomorrow. Stress patient stressed because she has been out of work and then returned to work with a restricted work schedule per her orthopedic doctor, also stressed because mother had a recent stroke an also issues with her neighbors as above. Will have patient return tomorrow for a recheck  of blood pressure and to discuss the plan by her orthopedic doctor for returning to work.  She has a blood pressure monitor at home and so can check blood pressure herself. She is aware of stroke -like or cardiac symptoms and knows to call 911 or go to the Emergency department if these occur. She verbalizes understanding and has no questions at this time.

## 2016-10-02 ENCOUNTER — Ambulatory Visit: Payer: BLUE CROSS/BLUE SHIELD | Admitting: Medical

## 2016-10-02 ENCOUNTER — Encounter: Payer: Self-pay | Admitting: Medical

## 2016-10-02 VITALS — BP 130/82 | HR 75 | Temp 97.0°F | Resp 16 | Ht 65.0 in | Wt 203.0 lb

## 2016-10-02 DIAGNOSIS — Z026 Encounter for examination for insurance purposes: Secondary | ICD-10-CM

## 2016-10-02 DIAGNOSIS — I10 Essential (primary) hypertension: Secondary | ICD-10-CM

## 2016-10-02 MED ORDER — VERAPAMIL HCL ER 300 MG PO CP24: 300.0000 mg | ORAL_CAPSULE | Freq: Every day | ORAL | 0 refills | Status: DC

## 2016-10-02 NOTE — Telephone Encounter (Signed)
Is there any other alternative doses of verapamil that the pharmacy can get? 300 mg?

## 2016-10-02 NOTE — Telephone Encounter (Signed)
Medication updated in EPIC

## 2016-10-02 NOTE — Telephone Encounter (Signed)
I told the pharmacist to go with 300 mg verapamil, #30, one daily and also for the patient to follow-up within 4-6 weeks to check blood pressure-nurse's-please change Epic to reflect the new medication Pharmacists will inform patient of the change and the need for follow-up

## 2016-10-02 NOTE — Progress Notes (Signed)
   Subjective:    Patient ID: Kaitlin [name], female    DOB: August 16, 1955, 61 y.o.   MRN: [id number]  HPI 61 yo female returns today for recheck of blood pressure, was able to get Verapamil 300mg  takes at bedtime. 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 She is out of her left knee brace today and released to full duty by her orthopedic doctor.  Her blood pressure today is 130/82 and she says she feels great today. Happy to be going back to work.   Review of Systems  Musculoskeletal: Negative for gait problem.  Neurological: Negative for headaches.  no problems moving left or right  knee. No neck pain.    Objective:   Physical Exam  Constitutional: She is oriented to person, place, and time. She appears well-developed and well-nourished.  HENT:  Head: Normocephalic and atraumatic.  Neurological: She is alert and oriented to person, place, and time.  Skin: Skin is warm and dry.  Psychiatric: She has a normal mood and affect. Her behavior is normal. Judgment and thought content normal.   Both knees with FROM, patient swinging them back and forth while on exam table. Gait within normal limits.  No swelling noted.       Assessment & Plan:   Hypertension , has started new medication Verapamil ER 300mg  / day. Blood pressure improved.She has follow up with her doctor in one month. Workers compensation knee improved, left knee out of brace.  Released by her orthopedic doctor to full duty ( notes copied), last physical therapy session tomorrow. Released to full duty by me today as well. Thankful for our care.  Return to the clinic as needed.

## 2016-10-02 NOTE — Telephone Encounter (Signed)
Dr Nicki Reaper spoke with pharmacist

## 2016-10-02 NOTE — Addendum Note (Signed)
Addended by: Dairl Ponder on: 10/02/2016 10:32 AM   Modules accepted: Orders

## 2016-10-03 ENCOUNTER — Ambulatory Visit: Payer: BLUE CROSS/BLUE SHIELD | Admitting: Medical

## 2016-10-03 NOTE — Patient Instructions (Signed)

## 2016-10-04 LAB — HM DIABETES EYE EXAM

## 2016-10-09 ENCOUNTER — Ambulatory Visit (INDEPENDENT_AMBULATORY_CARE_PROVIDER_SITE_OTHER): Payer: BLUE CROSS/BLUE SHIELD | Admitting: Internal Medicine

## 2016-10-09 VITALS — BP 130/80 | HR 72 | Temp 97.8°F | Ht 65.0 in | Wt 202.0 lb

## 2016-10-09 DIAGNOSIS — K5904 Chronic idiopathic constipation: Secondary | ICD-10-CM | POA: Diagnosis not present

## 2016-10-09 DIAGNOSIS — K219 Gastro-esophageal reflux disease without esophagitis: Secondary | ICD-10-CM | POA: Diagnosis not present

## 2016-10-09 NOTE — Progress Notes (Signed)
Primary Care Physician:  [name], MD Primary Gastroenterologist:  Dr. Gala Romney  Pre-Procedure History & Physical: HPI:  Kaitlin [name] is a 61 y.o. female here for for follow-up of GERD and constipation. Taking Linzess 145 every day - too much produced abdominal cramps and diarrhea. Taking it every other day currently still has to strain and is constipated again 30% of the time. Taking Protonix 40 mg every other day. Exelon was too expensive. Some breakthrough symptoms on a 4 times a day regimen. No dysphagia. She'll be due for surveillance colonoscopy 2020.  Past Medical History:  Diagnosis Date  . Anxiety   . BMI 30.0-30.9,adult 2008 182 LBS   2009 194 LBS  . Colon polyp APR 2008  . Depression   . Essential hypertension, benign   . Folliculitis 7/32/2025  . Helicobacter pylori gastritis AUG 2008 PREVPAC: nausea-->HELIDAC: GI UPSET   HB 14.1 NL HFP H. PYLORI STOOL Ag NEG  . Hypothyroidism   . Irritable bowel syndrome   . Kidney stones   . Mixed hyperlipidemia   . Mixed stress and urge urinary incontinence 09/26/2012  . Panic attacks   . Polyp of colon, hyperplastic   . Reflux   . Tubulovillous adenoma 08/07/10   Colonoscopy w/ Dr Oneida Alar w/ 7 polyps removed-bx showed tubular adenomas.  Largest 1cm-hepatic flexure polyp->TA.      Past Surgical History:  Procedure Laterality Date  . CHOLECYSTECTOMY    . COLONOSCOPY  APR 2008   AC/[place] SIMPLE ADENOMA  . COLONOSCOPY  08/07/2010   Dr. Raynald Kemp adenomas and tubulovillous adenoma; needs surveillance 2015  . COLONOSCOPY N/A 09/18/2013   Dr.Alzina Golda- normal rectum, 2 diminutive polys in the mid sigmoid segment o/w the remainder of the colonic mucosa appeared normal.hyperplastic poylps  . ESOPHAGOGASTRODUODENOSCOPY N/A 11/29/2014   RMR: Gastric erosions. Small hiatal hernia. Status post biopsy.   Marland Kitchen MASS EXCISION  02/28/2011   Procedure: EXCISION MASS;  Surgeon: Donato Heinz, MD;  Location: [facility];  Service: General;   Laterality: Right;  soft tissue mass  . UPPER GASTROINTESTINAL ENDOSCOPY  AUG 2008 PAIN/NAUSEA   H. PYLORI treated    Prior to Admission medications   Medication Sig Start Date End Date Taking? Authorizing Provider  ALPRAZolam Duanne Moron) 1 MG tablet Take 1 tablet (1 mg total) by mouth 3 (three) times daily. 08/03/16  Yes [name], MD  blood glucose meter kit and supplies Dispense based on patient and insurance preference. Test once a day E11.9 07/04/15  Yes [name], MD  Cholecalciferol (VITAMIN D3) 1000 units CAPS Take 2,000 Units by mouth daily.    Yes [provider]  enalapril (VASOTEC) 20 MG tablet Take 1 tablet (20 mg total) by mouth at bedtime. 12/28/15  Yes [name], MD  furosemide (LASIX) 20 MG tablet Take 0.5 tablets (10 mg total) by mouth daily. 12/28/15  Yes [name], MD  glucose blood test strip OneTouch Ultra Blue Test Strip   Yes [provider]  linaclotide [facility]) 145 MCG CAPS capsule Take 1 capsule (145 mcg total) by mouth daily before breakfast. 03/28/16  Yes Carlis Stable, NP  lovastatin (MEVACOR) 40 MG tablet TAKE 1 TABLET (40 MG TOTAL) BY MOUTH AT BEDTIME. 09/27/16  Yes [name], MD  ONE TOUCH ULTRA TEST test strip USE TO TEST ONCE DAILY 10/25/15  Yes [name], MD  OVER THE COUNTER MEDICATION Vitamin D 2,000 u Daily   Yes [provider]  pantoprazole (PROTONIX) 40 MG tablet Take 1 tablet (40 mg total) by mouth daily. 09/20/16  Yes Carlis Stable, NP  sucralfate (CARAFATE) 1 GM/10ML suspension Carafate 100 mg/mL oral suspension  TAKE 10 MLS (1 G TOTAL) BY MOUTH 4 (FOUR) TIMES DAILY - WITH MEALS AND AT BEDTIME.   Yes [provider]  Verapamil HCl CR 300 MG CP24 Take 1 capsule (300 mg total) by mouth daily. 10/02/16  Yes Kathyrn Drown, MD  meloxicam (MOBIC) 15 MG tablet Mobic 15 mg tablet  Take 1 tablet every day by oral route.    [provider]  pantoprazole (PROTONIX) 40 MG tablet  Take 40 mg by mouth. 11/29/14   [provider]    Allergies as of 10/09/2016 - Review Complete 10/09/2016  Allergen Reaction Noted  . Asa [aspirin] Other (See Comments) 06/09/2012  . Augmentin [amoxicillin-pot clavulanate] Diarrhea 06/09/2012  . Levaquin [levofloxacin in d5w] Other (See Comments) 07/14/2012  . Rocephin [ceftriaxone sodium in dextrose] Hives and Other (See Comments) 06/09/2012  . Valtrex [valacyclovir hcl] Hives and Other (See Comments) 06/09/2012  . Zoloft [sertraline hcl] Other (See Comments) 06/09/2012    Family History  Problem Relation Age of Onset  . Coronary artery disease Mother   . Hypertension Mother   . Heart attack Mother   . Heart disease Mother   . Coronary artery disease Brother        Age 74  . Heart attack Brother   . Heart disease Brother   . Diabetes Sister   . Diabetes Sister   . Colon cancer Neg Hx   . Anesthesia problems Neg Hx   . Hypotension Neg Hx   . Malignant hyperthermia Neg Hx   . Pseudochol deficiency Neg Hx     Social History   Social History  . Marital status: Married    Spouse name: N/A  . Number of children: N/A  . Years of education: N/A   Occupational History  . Not on file.   Social History Main Topics  . Smoking status: Never Smoker  . Smokeless tobacco: Never Used     Comment: Never Smoked  . Alcohol use No  . Drug use: No  . Sexual activity: No   Other Topics Concern  . Not on file   Social History Narrative  . No narrative on file    Review of Systems: See HPI, otherwise negative ROS  Physical Exam: BP 130/80   Pulse 72   Temp 97.8 F (36.6 C) (Oral)   Ht '5\' 5"'$  (1.651 m)   Wt 202 lb (91.6 kg)   BMI 33.61 kg/m  General:   Alert,   pleasant and cooperative in NAD Neck:  Supple; no masses or thyromegaly. No significant cervical adenopathy. Lungs:  Clear throughout to auscultation.   No wheezes, crackles, or rhonchi. No acute distress. Heart:  Regular rate and rhythm; no murmurs,  clicks, rubs,  or gallops. Abdomen: Non-distended, normal bowel sounds.  Soft and nontender without appreciable mass or hepatosplenomegaly.  Pulses:  Normal pulses noted. Extremities:  Without clubbing or edema.  Impression:  Pleasant 61 year old lady with GERD and constipation. GERD improved on Protonix 40 mg every other day. She really needs taken every day to be at maximal benefit.  Linzess 145 every day too strong. She needs a novel dosing regimen to enhance bowel function without overshooting our endpoint.  Recommendations:   Alternate Linzess 145 and 72 every other day (samples provided)  Take Protonix 40 mg  every day  Information on GERD and Constipation provided  Telephone f/u in 2 weeks  OV in 6 mos  Plan for colonoscopy in 2020   Notice: This dictation was prepared with Dragon dictation along with smaller phrase technology. Any transcriptional errors that result from this process are unintentional and may not be corrected upon review.

## 2016-10-09 NOTE — Patient Instructions (Addendum)
Alternate Linzess 145 and 72 every other day  Take Protonix 40 mg every day  Information on GERD and Constipation provided  Telephone f/u in 2 weeks  OV in 6 mos  Plan for colonoscopy in 2020

## 2016-10-22 ENCOUNTER — Telehealth: Payer: Self-pay | Admitting: Family Medicine

## 2016-10-22 MED ORDER — ESCITALOPRAM OXALATE 10 MG PO TABS
ORAL_TABLET | ORAL | 5 refills | Status: DC
Start: 2016-10-22 — End: 2017-04-09

## 2016-10-22 NOTE — Telephone Encounter (Signed)
lexapro 10 numb 30 five ref one q am

## 2016-10-22 NOTE — Telephone Encounter (Signed)
Patient said at her last visit, she discussed with Dr. Richardson Landry getting back on depression medication.  She has decided she wants to get back on medication and would like a low dose called in.  [facility]

## 2016-10-22 NOTE — Telephone Encounter (Signed)
Spoke with patient and informed her per Dr.Steve Luking- Lexapro was sent into pharmacy. Patient verbalized understanding.

## 2016-10-23 ENCOUNTER — Encounter: Payer: Self-pay | Admitting: *Deleted

## 2016-10-30 ENCOUNTER — Ambulatory Visit: Payer: BLUE CROSS/BLUE SHIELD | Admitting: Medical

## 2016-10-30 ENCOUNTER — Encounter: Payer: Self-pay | Admitting: Medical

## 2016-10-30 VITALS — BP 160/80 | HR 65 | Temp 97.2°F | Resp 18 | Ht 65.0 in | Wt 204.0 lb

## 2016-10-30 DIAGNOSIS — Z026 Encounter for examination for insurance purposes: Secondary | ICD-10-CM

## 2016-10-30 DIAGNOSIS — M25562 Pain in left knee: Secondary | ICD-10-CM

## 2016-10-30 NOTE — Patient Instructions (Signed)

## 2016-10-30 NOTE — Progress Notes (Signed)
   Subjective:    Patient ID: Kaitlin [name], female    DOB: April 03, 1955, 61 y.o.   MRN: [id number]  HPI  61 yo female returns to [place] for workers compensation follow up on bilateral Knee pain. Note in chart on 10/30/16 form [facility] states medial colleral ligament sprain and chondromalacia patella is now resolved.  Released to full duty working at the beginning of  10/05/16.  She will continue home exercises and final physical therapy session was 10/03/16. She may return to [facility] as needed.  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 Patient talked to workers compensation and okayed to return to [employer] . Patient presents with knee brace on. Patient icing and elevating knee more. Patient feels blood pressure is up due to pain and unhappy that her knee is hurting again. No symptoms of dizziness, chest pain or shortness of breath.    Review of Systems  Constitutional: Positive for activity change. Negative for chills and fever.  HENT: Positive for sinus pressure. Negative for congestion, ear discharge and sore throat.   Eyes: Negative for discharge and itching.  Respiratory: Negative for cough and shortness of breath.   Cardiovascular: Negative for chest pain.  Gastrointestinal: Negative for abdominal pain.  Genitourinary: Negative for dysuria.  Musculoskeletal: Positive for gait problem and joint swelling.  Skin: Negative for rash.  Neurological: Negative for dizziness, syncope, light-headedness and headaches.  Hematological: Negative for adenopathy.  Psychiatric/Behavioral: Positive for agitation. Negative for behavioral problems, confusion and hallucinations. Nervous/anxious:      Limping a little due to left knee  pain.  Prescribed Lexapro for depression, has not picked it up yet, but ready for her if she needs it , prescribed by primary doctor.  Had patient remove knee brace for exam.    Objective:   Physical Exam  Constitutional: She is oriented to person, place, and time. She appears well-developed and well-nourished.  HENT:  Head: Normocephalic and atraumatic.  Eyes: Pupils are equal, round, and reactive to light. Conjunctivae and EOM are normal.  Neck: Normal range of motion. Neck supple.  Neurological: She is alert and oriented to person, place, and time.  Skin: Skin is warm and dry.  Psychiatric: She has a normal mood and affect. Her behavior is normal. Judgment and thought content normal.  Nursing note and vitals reviewed.     Left Knee swollen lateral to patella and inferior. Pain on flexion but none on extension.  2+ poplitleal.and PT pulses. Patient walking on left leg gingerly.      Assessment & Plan:  Left knee pain Workers compensation Appt 10:50 am with [employer] today., patient grateful for appointment. Patient will call me with the plan when she is done with her appointment. Patient called to update me on plan, they are scheduling her for an MRI,  Work restrictions given to patient by [facility] which she took over to human resources. She is to return to the clinic as needed.

## 2016-11-12 ENCOUNTER — Telehealth: Payer: Self-pay | Admitting: Family Medicine

## 2016-11-12 MED ORDER — GLUCOSE BLOOD VI STRP: ORAL_STRIP | 5 refills | Status: DC

## 2016-11-12 NOTE — Telephone Encounter (Signed)
I called the patient and she is aware I resubmitted the rx.

## 2016-11-12 NOTE — Telephone Encounter (Signed)
Patient said that [facility] received a fax from our office for her test strips and it said that we did not know the patient.  She is requesting a call back.

## 2016-11-20 ENCOUNTER — Telehealth: Payer: Self-pay | Admitting: Medical

## 2016-11-28 ENCOUNTER — Telehealth: Payer: Self-pay | Admitting: Medical

## 2016-11-28 NOTE — Telephone Encounter (Signed)
Patient called  11/26/16  update me on what is going on with her workers compensation case of bilateral knee pain.   She did get a left knee MRI which showed arthritis. Patient was upset by this because she says she never had any problems before with her knees until she fell.  She is pending a meeting with the worker compensation nurse and Dr. Marica Otter of [facility].  She says this was not a pre-existing condition.  When she was last seen by Dr. Marica Otter she was given an injection into the left knee on  11/20/16.  She is back to working  8 hour shift.  She will keep me updated on the plan for her.

## 2016-12-11 DIAGNOSIS — M94262 Chondromalacia, left knee: Secondary | ICD-10-CM | POA: Insufficient documentation

## 2016-12-20 ENCOUNTER — Encounter: Payer: Self-pay | Admitting: Family Medicine

## 2016-12-20 ENCOUNTER — Ambulatory Visit (INDEPENDENT_AMBULATORY_CARE_PROVIDER_SITE_OTHER): Payer: BLUE CROSS/BLUE SHIELD | Admitting: Family Medicine

## 2016-12-20 VITALS — BP 130/82 | Ht 65.0 in | Wt 203.0 lb

## 2016-12-20 DIAGNOSIS — Z79899 Other long term (current) drug therapy: Secondary | ICD-10-CM

## 2016-12-20 DIAGNOSIS — R7303 Prediabetes: Secondary | ICD-10-CM

## 2016-12-20 DIAGNOSIS — R300 Dysuria: Secondary | ICD-10-CM

## 2016-12-20 DIAGNOSIS — F411 Generalized anxiety disorder: Secondary | ICD-10-CM

## 2016-12-20 DIAGNOSIS — R5383 Other fatigue: Secondary | ICD-10-CM | POA: Diagnosis not present

## 2016-12-20 DIAGNOSIS — E349 Endocrine disorder, unspecified: Secondary | ICD-10-CM

## 2016-12-20 DIAGNOSIS — I10 Essential (primary) hypertension: Secondary | ICD-10-CM

## 2016-12-20 DIAGNOSIS — E7800 Pure hypercholesterolemia, unspecified: Secondary | ICD-10-CM

## 2016-12-20 DIAGNOSIS — E119 Type 2 diabetes mellitus without complications: Secondary | ICD-10-CM | POA: Insufficient documentation

## 2016-12-20 LAB — POCT URINALYSIS DIPSTICK
Blood, UA: NEGATIVE
LEUKOCYTES UA: NEGATIVE
PH UA: 5 (ref 5.0–8.0)
Spec Grav, UA: 1.015 (ref 1.010–1.025)

## 2016-12-20 LAB — POCT GLYCOSYLATED HEMOGLOBIN (HGB A1C): HEMOGLOBIN A1C: 4.9

## 2016-12-20 NOTE — Progress Notes (Signed)
   Subjective:    Patient ID: Kaitlin [name], female    DOB: 04-09-1955, 61 y.o.   MRN: [id number]  Hypertension  This is a chronic problem. The current episode started more than 1 year ago.   She is currently on Verapamil 300 mg one daily. She tried to eat healthy, and does not get exercise. Has no energy and reports urine has a strong ammonia smell has frequency no burning or stinging. Has had a flu shot done through work. Results for orders placed or performed in visit on 12/20/16  POCT urinalysis dipstick  Result Value Ref Range   Color, UA     Clarity, UA     Glucose, UA     Bilirubin, UA     Ketones, UA     Spec Grav, UA 1.015 1.010 - 1.025   Blood, UA Negative    pH, UA 5.0 5.0 - 8.0   Protein, UA     Urobilinogen, UA  0.2 or 1.0 E.U./dL   Nitrite, UA     Leukocytes, UA Negative Negative  POCT glycosylated hemoglobin (Hb A1C)  Result Value Ref Range   Hemoglobin A1C 4.9    Not exercising much, still having some knee pain left over fo fall at work  Blood pressure medicine and blood pressure levels reviewed today with patient. Compliant with blood pressure medicine. States does not miss a dose. No obvious side effects. Blood pressure generally good when checked elsewhere. Watching salt intake.   Patient continues to take lipid medication regularly. No obvious side effects from it. Generally does not miss a dose. Prior blood work results are reviewed with patient. Patient continues to work on fat intake in diet  Sig fatigue  Pt ntes incr smell to urine lately, wonders about it   Energy level  Falls   Snores a lot  Up to 2000  Review of Systems No headache, no major weight loss or weight gain, no chest pain no back pain abdominal pain no change in bowel habits complete ROS otherwise negative     Objective:   Physical Exam Alert and oriented, vitals reviewed and stable, NAD ENT-TM's and ext canals WNL bilat via otoscopic exam Soft palate, tonsils and post  pharynx WNL via oropharyngeal exam Neck-symmetric, no masses; thyroid nonpalpable and nontender Pulmonary-no tachypnea or accessory muscle use; Clear without wheezes via auscultation Card--no abnrml murmurs, rhythm reg and rate WNL Carotid pulses symmetric, without bruits  Urinalysis wi       Assessment & Plan:  Impression 1 hypertension good control discussed maintain same meds  #2 hyperlipidemia status uncertain prior blood work reviewed maintain same pending blood work  #3 fatigue substantial per patient chronic  #4chronic anxiety ongoing need for meds discussed compliance discussed side effects and benefits discussed  #5 elevated parathyroid hormone and calcium  History had flu shot appropriate blood work medications refilled recheck in 6 months

## 2016-12-21 LAB — BASIC METABOLIC PANEL
BUN / CREAT RATIO: 21 (ref 9–23)
BUN: 16 mg/dL (ref 6–24)
CALCIUM: 10.7 mg/dL — AB (ref 8.7–10.2)
CHLORIDE: 102 mmol/L (ref 96–106)
CO2: 25 mmol/L (ref 20–29)
Creatinine, Ser: 0.76 mg/dL (ref 0.57–1.00)
GFR, EST AFRICAN AMERICAN: 99 mL/min/{1.73_m2} (ref >59)
GFR, EST NON AFRICAN AMERICAN: 86 mL/min/{1.73_m2} (ref >59)
Glucose: 85 mg/dL (ref 65–99)
Potassium: 4.5 mmol/L (ref 3.5–5.2)
SODIUM: 139 mmol/L (ref 134–144)

## 2016-12-21 LAB — LIPID PANEL
CHOL/HDL RATIO: 3.1 ratio (ref 0.0–4.4)
Cholesterol, Total: 180 mg/dL (ref 100–199)
HDL: 58 mg/dL (ref >39)
LDL Calculated: 101 mg/dL — ABNORMAL HIGH (ref 0–99)
TRIGLYCERIDES: 107 mg/dL (ref 0–149)
VLDL Cholesterol Cal: 21 mg/dL (ref 5–40)

## 2016-12-21 LAB — CBC WITH DIFFERENTIAL/PLATELET
BASOS: 0 %
Basophils Absolute: 0 10*3/uL (ref 0.0–0.2)
EOS (ABSOLUTE): 0.1 10*3/uL (ref 0.0–0.4)
EOS: 2 %
HEMATOCRIT: 40.7 % (ref 34.0–46.6)
Hemoglobin: 13.8 g/dL (ref 11.1–15.9)
Immature Grans (Abs): 0 10*3/uL (ref 0.0–0.1)
Immature Granulocytes: 0 %
LYMPHS: 30 %
Lymphocytes Absolute: 2.8 10*3/uL (ref 0.7–3.1)
MCH: 28.6 pg (ref 26.6–33.0)
MCHC: 33.9 g/dL (ref 31.5–35.7)
MCV: 84 fL (ref 79–97)
Monocytes Absolute: 0.5 10*3/uL (ref 0.1–0.9)
Monocytes: 6 %
NEUTROS PCT: 62 %
Neutrophils Absolute: 5.8 10*3/uL (ref 1.4–7.0)
Platelets: 306 10*3/uL (ref 150–379)
RBC: 4.83 x10E6/uL (ref 3.77–5.28)
RDW: 13.5 % (ref 12.3–15.4)
WBC: 9.3 10*3/uL (ref 3.4–10.8)

## 2016-12-21 LAB — TSH: TSH: 3.38 u[IU]/mL (ref 0.450–4.500)

## 2016-12-21 LAB — HEPATIC FUNCTION PANEL
ALT: 25 IU/L (ref 0–32)
AST: 27 IU/L (ref 0–40)
Albumin: 4.7 g/dL (ref 3.5–5.5)
Alkaline Phosphatase: 70 IU/L (ref 39–117)
BILIRUBIN TOTAL: 0.3 mg/dL (ref 0.0–1.2)
BILIRUBIN, DIRECT: 0.1 mg/dL (ref 0.00–0.40)
TOTAL PROTEIN: 7.4 g/dL (ref 6.0–8.5)

## 2016-12-23 ENCOUNTER — Encounter: Payer: Self-pay | Admitting: Family Medicine

## 2016-12-28 ENCOUNTER — Telehealth: Payer: Self-pay | Admitting: Family Medicine

## 2016-12-28 MED ORDER — FUROSEMIDE 20 MG PO TABS: 20.0000 mg | ORAL_TABLET | Freq: Every day | ORAL | 0 refills | Status: DC

## 2016-12-28 NOTE — Telephone Encounter (Signed)
Requesting Rx for Lasix.   [facility]

## 2016-12-28 NOTE — Telephone Encounter (Signed)
-  Please talk with patient find out that she is Lasix every day?  It is a historical medication does not appear we have prescribed it.  If she takes it every day she may have a prescription for Lasix 20 mg 1 every morning with 3 refills(if she does not take it every day change directions to 1 every morning as needed)

## 2016-12-28 NOTE — Telephone Encounter (Signed)
Prescription sent electronically to pharmacy. Patient notified. 

## 2017-01-03 ENCOUNTER — Telehealth: Payer: Self-pay | Admitting: Family Medicine

## 2017-01-03 DIAGNOSIS — K3189 Other diseases of stomach and duodenum: Secondary | ICD-10-CM

## 2017-01-03 DIAGNOSIS — E039 Hypothyroidism, unspecified: Secondary | ICD-10-CM

## 2017-01-03 NOTE — Telephone Encounter (Signed)
Discussed with pt. Results were put in a letter. Letter still says open and not sent so I read pt the letter. She wants to know if she needs have b12 level and pth checked also. Pt aware dr Richardson Landry out of office til Monday.  Message also routed to Medical records to send pt a copy of the letter in the mail.

## 2017-01-03 NOTE — Telephone Encounter (Signed)
Pt called wanting to know the results to her recent lab work.

## 2017-01-04 ENCOUNTER — Other Ambulatory Visit: Payer: Self-pay | Admitting: Family Medicine

## 2017-01-07 ENCOUNTER — Encounter: Payer: Self-pay | Admitting: Cardiology

## 2017-01-07 ENCOUNTER — Ambulatory Visit: Payer: BLUE CROSS/BLUE SHIELD | Admitting: Cardiology

## 2017-01-07 VITALS — BP 138/68 | HR 96 | Wt 204.0 lb

## 2017-01-07 DIAGNOSIS — E782 Mixed hyperlipidemia: Secondary | ICD-10-CM | POA: Diagnosis not present

## 2017-01-07 DIAGNOSIS — R0789 Other chest pain: Secondary | ICD-10-CM | POA: Diagnosis not present

## 2017-01-07 DIAGNOSIS — R002 Palpitations: Secondary | ICD-10-CM

## 2017-01-07 DIAGNOSIS — I10 Essential (primary) hypertension: Secondary | ICD-10-CM | POA: Diagnosis not present

## 2017-01-07 NOTE — Patient Instructions (Signed)
Your physician wants you to follow-up in: 1 year with Dr.Branch You will receive a reminder letter in the mail two months in advance. If you don't receive a letter, please call our office to schedule the follow-up appointment.  Your physician recommends that you continue on your current medications as directed. Please refer to the Current Medication list given to you today.  If you need a refill on your cardiac medications before your next appointment, please call your pharmacy.    No lab work or tests ordered today.     Thank you for choosing [facility] !

## 2017-01-07 NOTE — Telephone Encounter (Signed)
Patient is aware. Orders sent to [facility].

## 2017-01-07 NOTE — Progress Notes (Signed)
Clinical Summary Ms. Beel is a 61 y.o.female seen today for follow up of the following medical problems.   1. Chest pain - long history of chest pain - seen by Dr Domenic Polite in 2012 for chest pain. Echo at that time without significant pathology. - 12/2009 Lexiscan nuclear stress no ishcemia  - she denies any recent chest pain/SOB/DOE  2. GERD - followed by Dr Gala Romney   3. Palpitations - she denies any recent symptoms.   4. Hyperlipidemia - 06/2016: TC 196 TG 113 HDL 55 LDL 118 - 12/2016 TC 180 TG 107 HDL 58 LDL 101 - compliant with statin  5. 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    Past Medical History:  Diagnosis Date  . Anxiety   . BMI 30.0-30.9,adult 2008 182 LBS   2009 194 LBS  . Colon polyp APR 2008  . Depression   . Essential hypertension, benign   . Folliculitis 0/86/5784  . Helicobacter pylori gastritis AUG 2008 PREVPAC: nausea-->HELIDAC: GI UPSET   HB 14.1 NL HFP H. PYLORI STOOL Ag NEG  . Hypothyroidism   . Irritable bowel syndrome   . Kidney stones   . Mixed hyperlipidemia   . Mixed stress and urge urinary incontinence 09/26/2012  . Panic attacks   . Polyp of colon, hyperplastic   . Reflux   . Tubulovillous adenoma 08/07/10   Colonoscopy w/ Dr Oneida Alar w/ 7 polyps removed-bx showed tubular adenomas.  Largest 1cm-hepatic flexure polyp->TA.       Allergies  Allergen Reactions  . Asa [Aspirin] Other (See Comments)    Increases anxiety  . Augmentin [Amoxicillin-Pot Clavulanate] Diarrhea  . Levaquin [Levofloxacin In D5w] Other (See Comments)    chestpain  . Rocephin [Ceftriaxone Sodium In Dextrose] Hives and Other (See Comments)    blisters  . Valtrex [Valacyclovir Hcl] Hives and Other (See Comments)  . Zoloft [Sertraline Hcl] Other (See Comments)    Too strong     Current Outpatient Medications  Medication Sig Dispense Refill  . ALPRAZolam (XANAX) 1 MG tablet Take 1 tablet (1 mg total) by  mouth 3 (three) times daily. 90 tablet 5  . blood glucose meter kit and supplies Dispense based on patient and insurance preference. Test once a day E11.9 1 each 0  . Cholecalciferol (VITAMIN D3) 1000 units CAPS Take 2,000 Units by mouth daily.     . enalapril (VASOTEC) 20 MG tablet TAKE 1 TABLET (20 MG TOTAL) BY MOUTH AT BEDTIME. 30 tablet 5  . escitalopram (LEXAPRO) 10 MG tablet Take 1 tablet by mouth every morning. 30 tablet 5  . furosemide (LASIX) 20 MG tablet Take 1 tablet (20 mg total) by mouth daily. 30 tablet 0  . glucose blood (ONE TOUCH ULTRA TEST) test strip USE TO TEST ONCE DAILY 50 each 5  . glucose blood test strip OneTouch Ultra Blue Test Strip  USE TO TEST ONCE DAILY    . levothyroxine (SYNTHROID, LEVOTHROID) 50 MCG tablet TAKE 1 TABLET (50 MCG TOTAL) BY MOUTH DAILY.  5  . linaclotide (LINZESS) 145 MCG CAPS capsule Take by mouth.    . lovastatin (MEVACOR) 40 MG tablet Take 40 mg by mouth.    . meloxicam (MOBIC) 15 MG tablet Mobic 15 mg tablet  Take 1 tablet every day by oral route.    Marland Kitchen OVER THE COUNTER MEDICATION Vitamin D 2,000 u Daily    . pantoprazole (PROTONIX) 40 MG tablet Take 1 tablet (  40 mg total) by mouth daily. 30 tablet 5  . pantoprazole (PROTONIX) 40 MG tablet Take 40 mg by mouth.    . sucralfate (CARAFATE) 1 GM/10ML suspension Carafate 100 mg/mL oral suspension  TAKE 10 MLS (1 G TOTAL) BY MOUTH 4 (FOUR) TIMES DAILY - WITH MEALS AND AT BEDTIME.    . Verapamil HCl CR 300 MG CP24 Take 1 capsule (300 mg total) by mouth daily. 30 each 0   No current facility-administered medications for this visit.      Past Surgical History:  Procedure Laterality Date  . CHOLECYSTECTOMY    . COLONOSCOPY  APR 2008   AC/[place] SIMPLE ADENOMA  . COLONOSCOPY  08/07/2010   Dr. Raynald Kemp adenomas and tubulovillous adenoma; needs surveillance 2015  . UPPER GASTROINTESTINAL ENDOSCOPY  AUG 2008 PAIN/NAUSEA   H. PYLORI treated     Allergies  Allergen Reactions  . Asa [Aspirin]  Other (See Comments)    Increases anxiety  . Augmentin [Amoxicillin-Pot Clavulanate] Diarrhea  . Levaquin [Levofloxacin In D5w] Other (See Comments)    chestpain  . Rocephin [Ceftriaxone Sodium In Dextrose] Hives and Other (See Comments)    blisters  . Valtrex [Valacyclovir Hcl] Hives and Other (See Comments)  . Zoloft [Sertraline Hcl] Other (See Comments)    Too strong      Family History  Problem Relation Age of Onset  . Coronary artery disease Mother   . Hypertension Mother   . Heart attack Mother   . Heart disease Mother   . Coronary artery disease Brother        Age 54  . Heart attack Brother   . Heart disease Brother   . Diabetes Sister   . Diabetes Sister   . Colon cancer Neg Hx   . Anesthesia problems Neg Hx   . Hypotension Neg Hx   . Malignant hyperthermia Neg Hx   . Pseudochol deficiency Neg Hx      Social History Ms. Totzke reports that  has never smoked. she has never used smokeless tobacco. Ms. Yearsley reports that she does not drink alcohol.   Review of Systems CONSTITUTIONAL: No weight loss, fever, chills, weakness or fatigue.  HEENT: Eyes: No visual loss, blurred vision, double vision or yellow sclerae.No hearing loss, sneezing, congestion, runny nose or sore throat.  SKIN: No rash or itching.  CARDIOVASCULAR: per hpi RESPIRATORY: No shortness of breath, cough or sputum.  GASTROINTESTINAL: No anorexia, nausea, vomiting or diarrhea. No abdominal pain or blood.  GENITOURINARY: No burning on urination, no polyuria NEUROLOGICAL: No headache, dizziness, syncope, paralysis, ataxia, numbness or tingling in the extremities. No change in bowel or bladder control.  MUSCULOSKELETAL: No muscle, back pain, joint pain or stiffness.  LYMPHATICS: No enlarged nodes. No history of splenectomy.  PSYCHIATRIC: No history of depression or anxiety.  ENDOCRINOLOGIC: No reports of sweating, cold or heat intolerance. No polyuria or polydipsia.  Marland Kitchen   Physical  Examination Vitals:   01/07/17 1141  BP: 138/68  Pulse: 96  SpO2: 97%   Vitals:   01/07/17 1141  Weight: 204 lb (92.5 kg)    Gen: resting comfortably, no acute distress HEENT: no scleral icterus, pupils equal round and reactive, no palptable cervical adenopathy,  CV: RRR, no m/r/g, no jvd Resp: Clear to auscultation bilaterally GI: abdomen is soft, non-tender, non-distended, normal bowel sounds, no hepatosplenomegaly MSK: extremities are warm, no edema.  Skin: warm, no rash Neuro:  no focal deficits Psych: appropriate affect   Diagnostic Studies 12/2010 echo Study  Conclusions  - Left ventricle: The cavity size was normal. Mild septal hypertrophy. Systolic function was normal. The estimated ejection fraction was in the range of 60% to 65%. Wall motion was normal; there were no regional wall motion abnormalities. - Aortic valve: Mildly calcified annulus. - Mitral valve: Calcified annulus. - Atrial septum: No defect or patent foramen ovale was identified.    Assessment and Plan  1. Chest pain - long history of chronic atypical chest pain with negative ischemic testing.  - no recent symptoms, we will continue to follow clinically  2. Palpitatoins - no recent symptoms, continue current meds  3. Hyperlipidemia - at goal, continue statin  4. HTN - home bp's at goal, continue current meds  F/u 1 year      Arnoldo Lenis, M.D.

## 2017-01-07 NOTE — Telephone Encounter (Signed)
Ok lets do 

## 2017-01-07 NOTE — Addendum Note (Signed)
Addended by: Karle Barr on: 01/07/2017 08:59 AM   Modules accepted: Orders

## 2017-01-08 ENCOUNTER — Ambulatory Visit: Payer: BLUE CROSS/BLUE SHIELD | Admitting: Family Medicine

## 2017-01-08 ENCOUNTER — Encounter: Payer: Self-pay | Admitting: Cardiology

## 2017-01-08 ENCOUNTER — Encounter: Payer: Self-pay | Admitting: Family Medicine

## 2017-01-08 VITALS — BP 110/72 | Temp 98.4°F | Ht 65.0 in | Wt 202.0 lb

## 2017-01-08 DIAGNOSIS — R21 Rash and other nonspecific skin eruption: Secondary | ICD-10-CM

## 2017-01-08 MED ORDER — TRIAMCINOLONE ACETONIDE 0.1 % EX CREA: 1.0000 "application " | TOPICAL_CREAM | Freq: Two times a day (BID) | CUTANEOUS | 0 refills | Status: DC

## 2017-01-08 MED ORDER — PREDNISONE 20 MG PO TABS: ORAL_TABLET | ORAL | 0 refills | Status: DC

## 2017-01-08 NOTE — Progress Notes (Signed)
   Subjective:    Patient ID: Kaitlin [name], female    DOB: 1955-07-16, 61 y.o.   MRN: [id number]  Rash  This is a new problem. The current episode started in the past 7 days. The rash is diffuse. The rash is characterized by itchiness and redness. Past treatments include antihistamine and anti-itch cream.   Worked in yard, got out doors and got into   The brush and   Broke out all over the place  Pretty day one week ago   trerribly itchy, scratching very bad   Patient states no other concerns this visit.   Review of Systems  Skin: Positive for rash.       Objective:   Physical Exam Alert vitals stable, NAD. Blood pressure good on repeat. HEENT normal. Lungs clear. Heart regular rate and rhythm.  p rash present. p arms  Trunk arms small discrete papules clearly.  Impression 1 probable bug bites local measures discussed with medical interventions discussed     Assessment & Plan:

## 2017-01-08 NOTE — Patient Instructions (Signed)
Straw itch mites                                                         +

## 2017-01-14 ENCOUNTER — Telehealth: Payer: Self-pay | Admitting: Family Medicine

## 2017-01-14 MED ORDER — HYDROXYZINE HCL 25 MG PO TABS: ORAL_TABLET | ORAL | 0 refills | Status: DC

## 2017-01-14 MED ORDER — MOMETASONE FUROATE 0.1 % EX CREA: TOPICAL_CREAM | CUTANEOUS | 0 refills | Status: DC

## 2017-01-14 MED ORDER — MOMETASONE FUROATE 0.1 % EX CREA: 1.0000 "application " | TOPICAL_CREAM | Freq: Every day | CUTANEOUS | 0 refills | Status: DC

## 2017-01-14 NOTE — Telephone Encounter (Signed)
Pt states that she has been scratching something terrible and that bumps pop up on her arms. Pt states that she cant stop scratching and it has her nerves tore up. Please advise.

## 2017-01-14 NOTE — Telephone Encounter (Signed)
Elocon cr 60 g bid affected area  Hydroxyzine 25 numb 24 one po qhs prn itching

## 2017-01-14 NOTE — Telephone Encounter (Signed)
Spoke with patient and informed her per Dr.Steve Luking- we are sending in elocon cream [facility] Patient verbalized understanding.

## 2017-01-16 ENCOUNTER — Telehealth: Payer: Self-pay | Admitting: Family Medicine

## 2017-01-16 DIAGNOSIS — R21 Rash and other nonspecific skin eruption: Secondary | ICD-10-CM

## 2017-01-16 NOTE — Telephone Encounter (Signed)
Pt is wanting to know if she needs to be set up with dermatology for this rash/bite. Pt states that her nerves are really tore up about this and doesn't know what she needs to do. The cream that was prescribed for the pt came in today. Pt states that she is going home to take a bath and put the cream on but is curious if she needs to see a dermatologist. Please advise.

## 2017-01-16 NOTE — Telephone Encounter (Signed)
Derm ref

## 2017-01-17 NOTE — Telephone Encounter (Signed)
Spoke with patient and informed her per Dr.Steve Luking- We have ordered the referral in epic for dermatology. Patient verbalized understanding.

## 2017-01-23 ENCOUNTER — Other Ambulatory Visit: Payer: Self-pay | Admitting: Family Medicine

## 2017-01-23 DIAGNOSIS — L500 Allergic urticaria: Secondary | ICD-10-CM | POA: Diagnosis not present

## 2017-01-23 DIAGNOSIS — S50862A Insect bite (nonvenomous) of left forearm, initial encounter: Secondary | ICD-10-CM | POA: Diagnosis not present

## 2017-02-04 ENCOUNTER — Telehealth: Payer: Self-pay | Admitting: Family Medicine

## 2017-02-04 NOTE — Telephone Encounter (Signed)
Pt is out of her bp medication and the pharmacy is currently out of it. Pt is wanting to know what she needs to do. Please advise.

## 2017-02-04 NOTE — Telephone Encounter (Signed)
Tried to call no answer 02/04/17

## 2017-02-06 NOTE — Telephone Encounter (Signed)
Spoke with the pt and she states the pharmacy told her two days ago they would have to order her Verapamil 300 mg one daily.I asked that she call the pharmacy back to see if they have ordered and have it on shelf, if not let me know and I would ask the Dr.to see if another medication needs to be ordered.

## 2017-02-07 DIAGNOSIS — B880 Other acariasis: Secondary | ICD-10-CM | POA: Diagnosis not present

## 2017-02-20 ENCOUNTER — Telehealth: Payer: Self-pay | Admitting: Family Medicine

## 2017-02-20 ENCOUNTER — Other Ambulatory Visit: Payer: Self-pay | Admitting: Nurse Practitioner

## 2017-02-20 MED ORDER — ALPRAZOLAM 1 MG PO TABS: 1.0000 mg | ORAL_TABLET | Freq: Three times a day (TID) | ORAL | 5 refills | Status: DC

## 2017-02-20 NOTE — Telephone Encounter (Signed)
Requesting Rx for [facility] to [facility] [place].

## 2017-02-20 NOTE — Telephone Encounter (Signed)
Done

## 2017-03-06 ENCOUNTER — Telehealth: Payer: Self-pay | Admitting: Family Medicine

## 2017-03-06 DIAGNOSIS — F419 Anxiety disorder, unspecified: Secondary | ICD-10-CM

## 2017-03-06 NOTE — Telephone Encounter (Signed)
With pts severe chronic anxiety definitely needs psychiatrist, advise her not to quit this one til she gets a new one, lets do ref

## 2017-03-06 NOTE — Telephone Encounter (Signed)
Patient states she see's Dr.Caur in [place] is too far to drive. Would like to have someone close by in [place]. Please advise.

## 2017-03-06 NOTE — Telephone Encounter (Signed)
Pt is requesting a psychiatry referral to be sent over to someone in [place]. Pt seen one in [place] but is wanting someone in [place]. Please advise.

## 2017-03-06 NOTE — Telephone Encounter (Signed)
Patient is aware 

## 2017-03-07 ENCOUNTER — Encounter: Payer: Self-pay | Admitting: Family Medicine

## 2017-03-07 DIAGNOSIS — L304 Erythema intertrigo: Secondary | ICD-10-CM | POA: Diagnosis not present

## 2017-03-20 ENCOUNTER — Telehealth: Payer: Self-pay | Admitting: Family Medicine

## 2017-03-20 ENCOUNTER — Encounter: Payer: Self-pay | Admitting: Family Medicine

## 2017-03-20 DIAGNOSIS — F419 Anxiety disorder, unspecified: Secondary | ICD-10-CM

## 2017-03-20 NOTE — Telephone Encounter (Signed)
Butch Penny at [facility] states she will call the pt today and schedule her appt with them. I alerted her that our outpt appt coordinator had already sent it through epic. It should be in her inbox in epic.

## 2017-03-20 NOTE — Telephone Encounter (Signed)
I spoke with Butch Penny At [facility] and she states she is new and she did not see the paper work on this pt(could we refax information on pt )?. I told her the pt was told that someone was reviewing her records to see if they were going to take her as a pt. I asked that she please find out if they have her paperwork as we have a pt whom is in need and would appreciate if she was check in on this as soon as possible. I checked with Brendale our outpt appt coordinator we will have to put the referral in again in epic in order for her records to be sent again due to the nature of the appt she can not send the same referral as it disappears due to confidentiality.

## 2017-03-20 NOTE — Telephone Encounter (Signed)
I spoke with the pt and made sure she is not in any danger of harming herself or others and she states she is not. I let her know we were working on the referral and would let her know of the what we find out.

## 2017-03-20 NOTE — Telephone Encounter (Signed)
I have put the information (referral) in Epic again and Brendale the outpt referral coordinator is aware to send again and expedite request.

## 2017-03-20 NOTE — Telephone Encounter (Signed)
Pt is aware that they will be calling her with an appt. I asked that if she does not hear from them to please let us know.

## 2017-03-20 NOTE — Telephone Encounter (Signed)
We referred patient to [facility] a couple of weeks ago.  She contacted their office this past Friday to check on getting her scheduled, and she was told that the doctor is still reviewing her records to decide if they want to see her or not.  She said she is about to have a nervous breakdown.  She clarified she is not suicidal, "its just my nerves".  She just wants to know what Dr. Richardson Landry recommends because she doesn't feel like they are taking her serious.

## 2017-03-25 ENCOUNTER — Other Ambulatory Visit: Payer: Self-pay | Admitting: Family Medicine
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
## 2017-04-09 ENCOUNTER — Encounter (INDEPENDENT_AMBULATORY_CARE_PROVIDER_SITE_OTHER): Payer: Self-pay

## 2017-04-09 ENCOUNTER — Ambulatory Visit (INDEPENDENT_AMBULATORY_CARE_PROVIDER_SITE_OTHER): Payer: BLUE CROSS/BLUE SHIELD | Admitting: Psychiatry

## 2017-04-09 ENCOUNTER — Encounter (HOSPITAL_COMMUNITY): Payer: Self-pay | Admitting: Psychiatry

## 2017-04-09 VITALS — BP 160/100 | HR 94 | Ht 65.0 in | Wt 194.0 lb

## 2017-04-09 DIAGNOSIS — F4310 Post-traumatic stress disorder, unspecified: Secondary | ICD-10-CM | POA: Diagnosis not present

## 2017-04-09 DIAGNOSIS — Z62898 Other specified problems related to upbringing: Secondary | ICD-10-CM

## 2017-04-09 DIAGNOSIS — F40218 Other animal type phobia: Secondary | ICD-10-CM | POA: Insufficient documentation

## 2017-04-09 DIAGNOSIS — G4700 Insomnia, unspecified: Secondary | ICD-10-CM | POA: Diagnosis not present

## 2017-04-09 DIAGNOSIS — F419 Anxiety disorder, unspecified: Secondary | ICD-10-CM | POA: Diagnosis not present

## 2017-04-09 DIAGNOSIS — F331 Major depressive disorder, recurrent, moderate: Secondary | ICD-10-CM | POA: Insufficient documentation

## 2017-04-09 DIAGNOSIS — R450 Nervousness: Secondary | ICD-10-CM | POA: Diagnosis not present

## 2017-04-09 MED ORDER — ESCITALOPRAM OXALATE 10 MG PO TABS: ORAL_TABLET | ORAL | 0 refills | Status: DC

## 2017-04-09 NOTE — Patient Instructions (Signed)
1. Start lexapro 5 mg daily for one week, then 10 mg daily  2. Would recommend switching from Xanax to Ativan 3. Return to clinic in one month for 30 mins 4. Referral to therapy

## 2017-04-12 ENCOUNTER — Other Ambulatory Visit: Payer: Self-pay | Admitting: Family Medicine

## 2017-04-15 ENCOUNTER — Ambulatory Visit: Payer: Self-pay | Admitting: Family Medicine

## 2017-04-17 ENCOUNTER — Ambulatory Visit: Payer: BLUE CROSS/BLUE SHIELD | Admitting: Medical

## 2017-04-17 ENCOUNTER — Encounter: Payer: Self-pay | Admitting: Medical

## 2017-04-17 VITALS — BP 140/78 | HR 75 | Temp 96.5°F | Resp 18 | Ht 65.0 in | Wt 199.3 lb

## 2017-04-17 DIAGNOSIS — Z8659 Personal history of other mental and behavioral disorders: Secondary | ICD-10-CM

## 2017-04-17 DIAGNOSIS — Z0130 Encounter for examination of blood pressure without abnormal findings: Secondary | ICD-10-CM

## 2017-04-17 NOTE — Progress Notes (Signed)
   Subjective:    Patient ID: Kaitlin [name], female    DOB: 15-Aug-1955, 62 y.o.   MRN: [id number]  HPI 62 yo female in non acute distress here for  Blood pressure check. Went to a PT test an had hr go up to  200 before she even started the test of walking , climbing etc. See form letter. Copied. She says she was anxious. She did not take a Xanax before the test. Her family doctor does not do workers compensation , he said he would look at the form and it would cost her  172 dollars.   Blood pressure 140/78, pulse 75, temperature (!) 96.5 F (35.8 C), temperature source Tympanic, resp. rate 18, height 5\' 5"  (1.651 m), weight 199 lb 4.8 oz (90.4 kg), SpO2 98 %. Review of Systems  Constitutional: Negative for chills and fever.  Respiratory: Negative for cough and shortness of breath.   Cardiovascular: Positive for palpitations (patient says she has heart racing if she gets excited.). Negative for chest pain and leg swelling.  Psychiatric/Behavioral: The patient is nervous/anxious (known anxiety history, no anxiety now took a xanax berfore coming.).       Got into straw mites in November treated by Dr Kaitlin [name]. Mother with stroke and patient got depressed and started having anxiety. Got a ticket driving to see mother who called and due to her stroke she cannot talk. Patient had anxiety about this situation.  Going to therapy for her  "mind". Because of the " bugs still bothering her".  Needs assesment test for knee, bp elevated to  168/88 ,  177/88 and  170/100  And hr went over  200 HR.after assesment tools were placed on patient with patients blood pressure above testing parameters.  Seen by Cardiology in November in 2018 and every thing was "fine". Told she did not need a new stress test due to labs and everything being "fine"..   Patient states she has anxiety  Problems.Takes Xanax 3 times a day. Took one Xanax today.  Was hospitalized for anxiety. Reviewed patient  encounters.     Objective:   Physical Exam  Constitutional: She is oriented to person, place, and time. She appears well-developed and well-nourished.  HENT:  Head: Normocephalic and atraumatic.  Eyes: Conjunctivae and EOM are normal. Pupils are equal, round, and reactive to light.  Cardiovascular: Normal rate, regular rhythm and normal heart sounds.  Pulmonary/Chest: Effort normal and breath sounds normal.  Neurological: She is alert and oriented to person, place, and time.  Skin: Skin is warm and dry.  Psychiatric: She has a normal mood and affect. Her behavior is normal. Judgment and thought content normal.  Nursing note and vitals reviewed.  Carefulluy gets off the table. Gait wnl. No swelling in ankles.   Patient affect in room calm.        Assessment & Plan:  Blood pressure check. History of Anxiety. She tells me she will not do the PT medical test because she does not want her heart rate going up and blood pressure elevated. She says she  is going to talk to Dr. Harlow [name] about the PT test,( her orthopedic doctor).  My recommendation is she will have to see cardiology before going to get the test done. I personally do not feel comfortable about clearing patient.She is going to talk to Kaitlin [name] in [place] after her appointment here. She tells me she will follow up with her primary care doctor as well.

## 2017-04-23 ENCOUNTER — Telehealth: Payer: Self-pay | Admitting: Family Medicine

## 2017-04-23 ENCOUNTER — Other Ambulatory Visit: Payer: Self-pay | Admitting: *Deleted

## 2017-04-23 MED ORDER — FUROSEMIDE 20 MG PO TABS: 20.0000 mg | ORAL_TABLET | Freq: Every day | ORAL | 2 refills | Status: DC

## 2017-04-23 NOTE — Telephone Encounter (Signed)
Patient said that her pharmacy told her that Dr. Richardson Landry rejected her refill for her Lasix 20 mg tab.  She is requesting a refill on this or a reason why it cannot be filled?   [facility]

## 2017-04-23 NOTE — Telephone Encounter (Signed)
Refill times three mo, rec chronic visit nezxt mo

## 2017-04-23 NOTE — Telephone Encounter (Signed)
Discussed with pt. Pt states she will call back to make appt because she is in the car now and cannot write down appt. Refills sent to pharm.

## 2017-04-23 NOTE — Telephone Encounter (Signed)
Left message to return call 

## 2017-04-23 NOTE — Telephone Encounter (Signed)
Last check up 06/25/16

## 2017-04-29 ENCOUNTER — Ambulatory Visit (HOSPITAL_COMMUNITY): Payer: BLUE CROSS/BLUE SHIELD | Admitting: Licensed Clinical Social Worker

## 2017-04-29 ENCOUNTER — Encounter (HOSPITAL_COMMUNITY): Payer: Self-pay | Admitting: Licensed Clinical Social Worker

## 2017-04-29 DIAGNOSIS — F331 Major depressive disorder, recurrent, moderate: Secondary | ICD-10-CM | POA: Diagnosis not present

## 2017-04-29 DIAGNOSIS — F40218 Other animal type phobia: Secondary | ICD-10-CM
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
## 2017-05-03 ENCOUNTER — Telehealth: Payer: Self-pay | Admitting: Family Medicine

## 2017-05-03 ENCOUNTER — Other Ambulatory Visit: Payer: Self-pay | Admitting: Family Medicine

## 2017-05-03 NOTE — Telephone Encounter (Signed)
Requesting refill on her Verapamil.  She said the pharmacy told her we haven't answered the refill request so they gave her 3 pills to get her by.  She said she doesn't need a call back unless necessary to speak with her.   [facility]

## 2017-05-03 NOTE — Telephone Encounter (Signed)
Sent med back into pharmacy

## 2017-05-03 NOTE — Telephone Encounter (Signed)
Pt.notified
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
## 2017-05-09 ENCOUNTER — Ambulatory Visit (INDEPENDENT_AMBULATORY_CARE_PROVIDER_SITE_OTHER): Payer: BLUE CROSS/BLUE SHIELD | Admitting: Psychiatry

## 2017-05-09 ENCOUNTER — Encounter (HOSPITAL_COMMUNITY): Payer: Self-pay | Admitting: Psychiatry

## 2017-05-09 VITALS — BP 138/78 | HR 82 | Ht 65.0 in | Wt 193.0 lb

## 2017-05-09 DIAGNOSIS — R45851 Suicidal ideations: Secondary | ICD-10-CM

## 2017-05-09 DIAGNOSIS — F40218 Other animal type phobia: Secondary | ICD-10-CM | POA: Diagnosis not present

## 2017-05-09 DIAGNOSIS — R450 Nervousness: Secondary | ICD-10-CM

## 2017-05-09 DIAGNOSIS — F4310 Post-traumatic stress disorder, unspecified: Secondary | ICD-10-CM | POA: Diagnosis not present

## 2017-05-09 DIAGNOSIS — F331 Major depressive disorder, recurrent, moderate: Secondary | ICD-10-CM | POA: Diagnosis not present

## 2017-05-09 DIAGNOSIS — F419 Anxiety disorder, unspecified: Secondary | ICD-10-CM

## 2017-05-09 MED ORDER — LORAZEPAM 1 MG PO TABS
1.0000 mg | ORAL_TABLET | Freq: Three times a day (TID) | ORAL | 1 refills | Status: DC | PRN
Start: 2017-05-09 — End: 2017-05-21

## 2017-05-09 MED ORDER — ESCITALOPRAM OXALATE 10 MG PO TABS: 10.0000 mg | ORAL_TABLET | Freq: Every day | ORAL | 1 refills | Status: DC

## 2017-05-09 NOTE — Patient Instructions (Addendum)
1. Continue lexapro 10 mg daily  2. Start ativan 0.5-1 mg three times a day as needed for anxiety after she completes Xanax  3. Return to clinic in two months for 30 mins

## 2017-05-16 ENCOUNTER — Other Ambulatory Visit: Payer: Self-pay | Admitting: Obstetrics & Gynecology

## 2017-05-16 DIAGNOSIS — Z1231 Encounter for screening mammogram for malignant neoplasm of breast: Secondary | ICD-10-CM

## 2017-05-21 ENCOUNTER — Ambulatory Visit: Payer: BLUE CROSS/BLUE SHIELD | Admitting: Family Medicine

## 2017-05-21 ENCOUNTER — Telehealth: Payer: Self-pay

## 2017-05-21 ENCOUNTER — Encounter: Payer: Self-pay | Admitting: Family Medicine

## 2017-05-21 ENCOUNTER — Telehealth (HOSPITAL_COMMUNITY): Payer: Self-pay | Admitting: Psychiatry

## 2017-05-21 ENCOUNTER — Telehealth (HOSPITAL_COMMUNITY): Payer: Self-pay | Admitting: *Deleted

## 2017-05-21 VITALS — BP 190/88 | Temp 98.5°F | Ht 65.0 in | Wt 187.0 lb

## 2017-05-21 DIAGNOSIS — E039 Hypothyroidism, unspecified: Secondary | ICD-10-CM

## 2017-05-21 DIAGNOSIS — E785 Hyperlipidemia, unspecified: Secondary | ICD-10-CM | POA: Diagnosis not present

## 2017-05-21 DIAGNOSIS — R079 Chest pain, unspecified: Secondary | ICD-10-CM | POA: Diagnosis not present

## 2017-05-21 DIAGNOSIS — E7800 Pure hypercholesterolemia, unspecified: Secondary | ICD-10-CM

## 2017-05-21 DIAGNOSIS — R300 Dysuria: Secondary | ICD-10-CM | POA: Diagnosis not present

## 2017-05-21 DIAGNOSIS — Z79899 Other long term (current) drug therapy: Secondary | ICD-10-CM | POA: Diagnosis not present

## 2017-05-21 DIAGNOSIS — R7303 Prediabetes: Secondary | ICD-10-CM | POA: Diagnosis not present

## 2017-05-21 DIAGNOSIS — F419 Anxiety disorder, unspecified: Secondary | ICD-10-CM

## 2017-05-21 LAB — POCT URINALYSIS DIPSTICK
RBC UA: NEGATIVE
Spec Grav, UA: >=1.03 — AB (ref 1.010–1.025)
pH, UA: 5 (ref 5.0–8.0)

## 2017-05-21 MED ORDER — ONDANSETRON 4 MG PO TBDP: 4.0000 mg | ORAL_TABLET | Freq: Three times a day (TID) | ORAL | 0 refills | Status: DC | PRN

## 2017-05-21 MED ORDER — ENALAPRIL MALEATE 20 MG PO TABS: ORAL_TABLET | ORAL | 5 refills | Status: DC

## 2017-05-21 MED ORDER — LEVOTHYROXINE SODIUM 50 MCG PO TABS: ORAL_TABLET | ORAL | 5 refills | Status: DC

## 2017-05-21 MED ORDER — FUROSEMIDE 20 MG PO TABS: 20.0000 mg | ORAL_TABLET | Freq: Every day | ORAL | 5 refills | Status: DC

## 2017-05-21 MED ORDER — VERAPAMIL HCL ER 300 MG PO CP24: 1.0000 | ORAL_CAPSULE | Freq: Every day | ORAL | 5 refills | Status: DC

## 2017-05-21 MED ORDER — LOVASTATIN 40 MG PO TABS: ORAL_TABLET | ORAL | 1 refills | Status: DC

## 2017-05-21 NOTE — Telephone Encounter (Addendum)
I believe she was doing very well with lexapro at the last visit. Per PCP note, she self discontinued lexapro and started Xanax. It is unclear whether her medical symptoms is causing her some mood issues. Advise her to come for sooner appointment to see if she needs medication change.

## 2017-05-21 NOTE — Progress Notes (Signed)
Subjective:    Patient ID: Kaitlin [name], female    DOB: 10-04-1955, 62 y.o.   MRN: [id number]  HPI  Patient is here today to follow up on her chronic issues. She states she sees Psychiatrist and is supposed to on Lexapro,but took self off of this,also stopped the Ativan.  She started taking the xanax.   She states this appt is supposed to be a chronic visit,but she is not feeling well, constantly burping,diarrhea,and chest hurts.   She thinks the medication Lexapro is causing. Cant sleep brain racing.   Burns to urinate. States she is burning from her vagina up through her chest.  Patient notes nausea.  She also notes anxiety.  She had some aching in her head nonspecific discomfort in her chest.  Also abdominal discomfort.  Noted mild dysuria.  Blood pressure medicine and blood pressure levels reviewed today with patient. Compliant with blood pressure medicine. States does not miss a dose. No obvious side effects. Blood pressure generally good when checked elsewhere. Watching salt intake.  Patient continues to take lipid medication regularly. No obvious side effects from it. Generally does not miss a dose. Prior blood work results are reviewed with patient. Patient continues to work on fat intake in diet  . Review of Systems No headache, no major weight loss or weight gain, no chest pain no back pain abdominal pain no change in bowel habits complete ROS otherwise negative     Results for orders placed or performed in visit on 05/21/17  POCT urinalysis dipstick  Result Value Ref Range   Color, UA     Clarity, UA     Glucose, UA     Bilirubin, UA     Ketones, UA     Spec Grav, UA >=1.030 (A) 1.010 - 1.025   Blood, UA Negative    pH, UA 5.0 5.0 - 8.0   Protein, UA 1+    Urobilinogen, UA  0.2 or 1.0 E.U./dL   Nitrite, UA     Leukocytes, UA Trace (A) Negative   Appearance     Odor      Objective:   Physical Exam  Alert and oriented, vitals reviewed and stable,  NAD ENT-TM's and ext canals WNL bilat via otoscopic exam Soft palate, tonsils and post pharynx WNL via oropharyngeal exam Neck-symmetric, no masses; thyroid nonpalpable and nontender Pulmonary-no tachypnea or accessory muscle use; Clear without wheezes via auscultation Card--no abnrml murmurs, rhythm reg and rate WNL Carotid pulses symmetric, without bruits  Abdominal exam no discrete tenderness bowel sounds somewhat hyperactive    EKG normal sinus rhythm no significant ST-T changes     Assessment & Plan:  Impression #1 chest discomfort.  Very nonspecific also associated with head and abdominal discomfort.  I think it may be related to #2 or potentially mild viral syndrome.  EKG normal.  Highly doubt cardiac etiology  2.  Chronic anxiety/depression/PTSD.  Patient definitely needs to have the psychiatrist manage her situation both short and long-term.  This once again is expressed carefully to the patient.  I have advised her that some of her symptoms may be due to abruptly stopping Lexapro and encouraged.  I will also go ahead and stop Xanax since patient psychiatrist initiated Klonopin  3.  Hyperlipidemia.  Status uncertain discussed need to follow-up prior results discussed compliance discussed  4.  Hypertension good control discussed to maintain same meds  5.  Prediabetes patient states checks his sugars on occasion generally good discussed  Further recommendations based on blood work.  Recheck in 6 months.  Diet exercise discussed  Greater than 50% of this 40 minute face to face visit was spent in counseling and discussion and coordination of care regarding the above diagnosis/diagnosies

## 2017-05-21 NOTE — Telephone Encounter (Signed)
Dr Modesta Messing Patient has arrived after visiting PCP &  She had a conversation with him about her Lexapro & Xanax.  He instructed to have a conversation with you. She states she has been able to sleep since taking the Lexapro   & request to go back on the Xanax.

## 2017-05-21 NOTE — Telephone Encounter (Signed)
Patient reported today at office visit she was no longer taking the Lorazepam 1 mg nor the Lexapro 10 mg that Dr.Reina Hisda prescribed. I accidentally removed these meds from the chart. I have added them back to the chart as pt reported under Dr. Norman Clay.

## 2017-05-22 LAB — BASIC METABOLIC PANEL
BUN/Creatinine Ratio: 10 — ABNORMAL LOW (ref 12–28)
BUN: 8 mg/dL (ref 8–27)
CALCIUM: 10.9 mg/dL — AB (ref 8.7–10.3)
CO2: 23 mmol/L (ref 20–29)
Chloride: 104 mmol/L (ref 96–106)
Creatinine, Ser: 0.77 mg/dL (ref 0.57–1.00)
GFR calc Af Amer: 97 mL/min/{1.73_m2} (ref >59)
GFR, EST NON AFRICAN AMERICAN: 84 mL/min/{1.73_m2} (ref >59)
Glucose: 102 mg/dL — ABNORMAL HIGH (ref 65–99)
POTASSIUM: 4.4 mmol/L (ref 3.5–5.2)
SODIUM: 143 mmol/L (ref 134–144)

## 2017-05-22 LAB — HEPATIC FUNCTION PANEL
ALBUMIN: 5.1 g/dL — AB (ref 3.6–4.8)
ALT: 24 IU/L (ref 0–32)
AST: 30 IU/L (ref 0–40)
Alkaline Phosphatase: 62 IU/L (ref 39–117)
BILIRUBIN, DIRECT: 0.17 mg/dL (ref 0.00–0.40)
Bilirubin Total: 0.4 mg/dL (ref 0.0–1.2)
Total Protein: 7.7 g/dL (ref 6.0–8.5)

## 2017-05-22 LAB — LIPID PANEL
CHOLESTEROL TOTAL: 181 mg/dL (ref 100–199)
Chol/HDL Ratio: 2.7 ratio (ref 0.0–4.4)
HDL: 67 mg/dL (ref >39)
LDL Calculated: 99 mg/dL (ref 0–99)
TRIGLYCERIDES: 76 mg/dL (ref 0–149)
VLDL Cholesterol Cal: 15 mg/dL (ref 5–40)

## 2017-05-22 LAB — TSH: TSH: 3.51 u[IU]/mL (ref 0.450–4.500)

## 2017-05-23 ENCOUNTER — Encounter: Payer: Self-pay | Admitting: Family Medicine

## 2017-05-23 LAB — SPECIMEN STATUS REPORT

## 2017-05-23 LAB — URINE CULTURE

## 2017-05-24 ENCOUNTER — Telehealth: Payer: Self-pay | Admitting: Family Medicine

## 2017-05-24 NOTE — Telephone Encounter (Signed)
Pt is wanting to know the results to recent labs and urine culture that she had done. Pt states that she would like to get medication before the weekend if it is needed. Please advise.

## 2017-05-26 ENCOUNTER — Emergency Department (HOSPITAL_COMMUNITY)
Admission: EM | Admit: 2017-05-26 | Discharge: 2017-05-26 | Disposition: A | Discharge status: Home / Self Care | Payer: BLUE CROSS/BLUE SHIELD | Attending: Emergency Medicine | Admitting: Emergency Medicine

## 2017-05-26 ENCOUNTER — Other Ambulatory Visit: Payer: Self-pay

## 2017-05-26 ENCOUNTER — Encounter (HOSPITAL_COMMUNITY): Payer: Self-pay | Admitting: *Deleted

## 2017-05-26 DIAGNOSIS — Z79899 Other long term (current) drug therapy: Secondary | ICD-10-CM | POA: Insufficient documentation

## 2017-05-26 DIAGNOSIS — E785 Hyperlipidemia, unspecified: Secondary | ICD-10-CM | POA: Diagnosis not present

## 2017-05-26 DIAGNOSIS — I10 Essential (primary) hypertension: Secondary | ICD-10-CM | POA: Insufficient documentation

## 2017-05-26 DIAGNOSIS — K219 Gastro-esophageal reflux disease without esophagitis: Secondary | ICD-10-CM | POA: Diagnosis not present

## 2017-05-26 DIAGNOSIS — E039 Hypothyroidism, unspecified: Secondary | ICD-10-CM | POA: Diagnosis not present

## 2017-05-26 DIAGNOSIS — R1013 Epigastric pain: Secondary | ICD-10-CM | POA: Diagnosis present

## 2017-05-26 LAB — CBC WITH DIFFERENTIAL/PLATELET
BASOS ABS: 0 10*3/uL (ref 0.0–0.1)
Basophils Relative: 0 %
EOS ABS: 0.1 10*3/uL (ref 0.0–0.7)
Eosinophils Relative: 1 %
HEMATOCRIT: 40.5 % (ref 36.0–46.0)
HEMOGLOBIN: 13 g/dL (ref 12.0–15.0)
Lymphocytes Relative: 29 %
Lymphs Abs: 2.1 10*3/uL (ref 0.7–4.0)
MCH: 28.1 pg (ref 26.0–34.0)
MCHC: 32.1 g/dL (ref 30.0–36.0)
MCV: 87.5 fL (ref 78.0–100.0)
MONOS PCT: 7 %
Monocytes Absolute: 0.5 10*3/uL (ref 0.1–1.0)
NEUTROS ABS: 4.5 10*3/uL (ref 1.7–7.7)
NEUTROS PCT: 63 %
Platelets: 253 10*3/uL (ref 150–400)
RBC: 4.63 MIL/uL (ref 3.87–5.11)
RDW: 12.5 % (ref 11.5–15.5)
WBC: 7.2 10*3/uL (ref 4.0–10.5)

## 2017-05-26 LAB — COMPREHENSIVE METABOLIC PANEL
ALBUMIN: 4 g/dL (ref 3.5–5.0)
ALK PHOS: 52 U/L (ref 38–126)
ALT: 24 U/L (ref 14–54)
ANION GAP: 9 (ref 5–15)
AST: 26 U/L (ref 15–41)
BILIRUBIN TOTAL: 0.5 mg/dL (ref 0.3–1.2)
BUN: 12 mg/dL (ref 6–20)
CALCIUM: 10.5 mg/dL — AB (ref 8.9–10.3)
CO2: 25 mmol/L (ref 22–32)
Chloride: 106 mmol/L (ref 101–111)
Creatinine, Ser: 0.71 mg/dL (ref 0.44–1.00)
Glucose, Bld: 108 mg/dL — ABNORMAL HIGH (ref 65–99)
POTASSIUM: 3.9 mmol/L (ref 3.5–5.1)
Sodium: 140 mmol/L (ref 135–145)
TOTAL PROTEIN: 7.2 g/dL (ref 6.5–8.1)

## 2017-05-26 LAB — URINALYSIS, ROUTINE W REFLEX MICROSCOPIC
BACTERIA UA: NONE SEEN
BILIRUBIN URINE: NEGATIVE
Glucose, UA: NEGATIVE mg/dL
HGB URINE DIPSTICK: NEGATIVE
KETONES UR: NEGATIVE mg/dL
NITRITE: NEGATIVE
PH: 7 (ref 5.0–8.0)
Protein, ur: NEGATIVE mg/dL
SPECIFIC GRAVITY, URINE: 1.003 — AB (ref 1.005–1.030)

## 2017-05-26 LAB — LIPASE, BLOOD: LIPASE: 27 U/L (ref 11–51)

## 2017-05-26 MED ORDER — GI COCKTAIL ~~LOC~~
30.0000 mL | Freq: Once | ORAL | Status: AC
  Administered 2017-05-26: 30 mL via ORAL
  Filled 2017-05-26: qty 30

## 2017-05-26 NOTE — Discharge Instructions (Addendum)
For the next two weeks, take your pantoprazole (Protonix) twice a day. Then resume taking it once a day. Take antacids as needed. Return if symptoms are getting worse.

## 2017-05-26 NOTE — ED Provider Notes (Signed)
[facility] EMERGENCY DEPARTMENT Provider Note   CSN: [id number] Arrival date & time: 05/26/17  1017     History   Chief Complaint Chief Complaint  Patient presents with  . Dysuria    HPI Kaitlin [name] is a 62 y.o. female.  The history is provided by the patient.  She has history of hypertension, hyperlipidemia, hypothyroidism, posttraumatic stress disorder, major depressive disorder and comes in with one-week history of epigastric burning.  This burning does radiate to the right upper quadrant and into the lower abdomen.  She rates the burning pain at 7/10.  Nothing makes it better, nothing makes it worse.  During this time, she has noted urinary frequency.  She states that every time she drinks, she has to urinate.  There has been no urinary urgency, tenesmus, or dysuria.  She denies fever or chills.  There has been mild nausea but no vomiting.  She does state that she is constipated.  She is also complaining of pain in her low and mid back, but not into the flanks.  She saw her PCP 3 days ago and had urine checked at that time, but was not started on the medication.  Of note, she does have history of GERD and is on daily pantoprazole.  She is status post cholecystectomy.  Past Medical History:  Diagnosis Date  . Anxiety   . BMI 30.0-30.9,adult 2008 182 LBS   2009 194 LBS  . Colon polyp APR 2008  . Depression   . Essential hypertension, benign   . Folliculitis 07/13/2583  . Helicobacter pylori gastritis AUG 2008 PREVPAC: nausea-->HELIDAC: GI UPSET   HB 14.1 NL HFP H. PYLORI STOOL Ag NEG  . Hypothyroidism   . Irritable bowel syndrome   . Kidney stones   . Mixed hyperlipidemia   . Mixed stress and urge urinary incontinence 09/26/2012  . Panic attacks   . Polyp of colon, hyperplastic   . Reflux   . Tubulovillous adenoma 08/07/10   Colonoscopy w/ Dr Oneida Alar w/ 7 polyps removed-bx showed tubular adenomas.  Largest 1cm-hepatic flexure polyp->TA.      Patient Active Problem List    Diagnosis Date Noted  . PTSD (post-traumatic stress disorder) 04/09/2017  . MDD (major depressive disorder), recurrent episode, moderate ([place]) 04/09/2017  . Phobia to insects 04/09/2017  . Prediabetes 12/20/2016  . Abdominal pain, epigastric 02/20/2016  . GERD (gastroesophageal reflux disease) 02/20/2016  . Mucosal abnormality of stomach   . Hiatal hernia   . Impaired fasting glucose 06/04/2014  . Benign paroxysmal positional vertigo 06/04/2014  . Constipation 02/17/2014  . Elevated parathyroid hormone 05/24/2013  . Folliculitis 27/78/2423  . Mixed stress and urge urinary incontinence 09/26/2012  . Subserous leiomyoma of uterus 09/01/2012  . Hypothyroidism 06/12/2012  . Venous stasis 06/12/2012  . Essential hypertension, benign 12/11/2010  . Chest pain 12/11/2010  . IBS (irritable bowel syndrome) 07/05/2010  . Hx of adenomatous colonic polyps 07/05/2010    Past Surgical History:  Procedure Laterality Date  . CHOLECYSTECTOMY    . COLONOSCOPY  APR 2008   AC/[facility] SIMPLE ADENOMA  . COLONOSCOPY  08/07/2010   Dr. Raynald Kemp adenomas and tubulovillous adenoma; needs surveillance 2015  . COLONOSCOPY N/A 09/18/2013   Dr.Rourk- normal rectum, 2 diminutive polys in the mid sigmoid segment o/w the remainder of the colonic mucosa appeared normal.hyperplastic poylps  . ESOPHAGOGASTRODUODENOSCOPY N/A 11/29/2014   RMR: Gastric erosions. Small hiatal hernia. Status post biopsy.   Marland Kitchen MASS EXCISION  02/28/2011   Procedure: EXCISION  MASS;  Surgeon: Donato Heinz, MD;  Location: [facility];  Service: General;  Laterality: Right;  soft tissue mass  . UPPER GASTROINTESTINAL ENDOSCOPY  AUG 2008 PAIN/NAUSEA   H. PYLORI treated     OB History    Gravida  1   Para  1   Term      Preterm      AB      Living        SAB      TAB      Ectopic      Multiple      Live Births               Home Medications    Prior to Admission medications   Medication Sig Start Date End Date  Taking? Authorizing Provider  blood glucose meter kit and supplies Dispense based on patient and insurance preference. Test once a day E11.9 07/04/15   [name], MD  Cholecalciferol (VITAMIN D3) 1000 units CAPS Take 2,000 Units by mouth daily.     [provider]  enalapril (VASOTEC) 20 MG tablet TAKE 1 TABLET (20 MG TOTAL) BY MOUTH AT BEDTIME. 05/21/17   [name], MD  escitalopram (LEXAPRO) 10 MG tablet Take 10 mg by mouth daily. Take one po daily .Started by Dr.Reina Modesta Messing 05/09/2017    Norman Clay, MD  furosemide (LASIX) 20 MG tablet Take 1 tablet (20 mg total) by mouth daily. 05/21/17   [name], MD  glucose blood test strip OneTouch Ultra Blue Test Strip  USE TO TEST ONCE DAILY    [provider]  levothyroxine (SYNTHROID, LEVOTHROID) 50 MCG tablet TAKE 1 TABLET (50 MCG TOTAL) BY MOUTH DAILY. 05/21/17   [name], MD  LORazepam (ATIVAN) 1 MG tablet Take 1 mg by mouth every 8 (eight) hours. 1 mg po Three times daily prn for anxiety Started 05/09/2017 by Dr.Reina Jan Fireman, Elie Goody, MD  lovastatin (MEVACOR) 40 MG tablet TAKE 1 TABLET BY MOUTH EVERYDAY AT BEDTIME 05/21/17   [name], MD  mometasone (ELOCON) 0.1 % cream Apply to affected area twice daily as needed for itching 01/14/17   [name], MD  ondansetron (ZOFRAN ODT) 4 MG disintegrating tablet Take 1 tablet (4 mg total) by mouth every 8 (eight) hours as needed for nausea or vomiting. 05/21/17   [name], MD  OVER THE COUNTER MEDICATION Vitamin D 2,000 u Daily    [provider]  pantoprazole (PROTONIX) 40 MG tablet Take 1 tablet (40 mg total) by mouth daily. 09/20/16   Carlis Stable, NP  triamcinolone cream (KENALOG) 0.1 % Apply 1 application 2 (two) times daily topically. 01/08/17   [name], MD  Verapamil HCl CR 300 MG CP24 Take 1 capsule (300 mg total) by mouth at bedtime. 05/21/17   [name], MD    Family History Family History    Problem Relation Age of Onset  . Coronary artery disease Mother   . Hypertension Mother   . Heart attack Mother   . Heart disease Mother   . Suicidality Mother   . Coronary artery disease Brother        Age 59  . Heart attack Brother   . Heart disease Brother   . Diabetes Sister   . Diabetes Sister   . Colon cancer Neg Hx   . Anesthesia problems Neg Hx   . Hypotension Neg Hx   .  Malignant hyperthermia Neg Hx   . Pseudochol deficiency Neg Hx     Social History Social History   Tobacco Use  . Smoking status: Never Smoker  . Smokeless tobacco: Never Used  . Tobacco comment: Never Smoked  Substance Use Topics  . Alcohol use: No  . Drug use: No     Allergies   Asa [aspirin]; Augmentin [amoxicillin-pot clavulanate]; Levaquin [levofloxacin in d5w]; Rocephin [ceftriaxone sodium in dextrose]; Valtrex [valacyclovir hcl]; and Zoloft [sertraline hcl]   Review of Systems Review of Systems  All other systems reviewed and are negative.    Physical Exam Updated Vital Signs BP (!) 174/83 (BP Location: Right Arm)   Pulse 80   Temp 98.1 F (36.7 C) (Oral)   Resp 16   Ht _0  (1.651 m)   Wt 84.8 kg (187 lb)   SpO2 100%   BMI 31.12 kg/m   Physical Exam  Nursing note and vitals reviewed.  62 year old female, resting comfortably and in no acute distress. Vital signs are significant for elevated systolic blood pressure. Oxygen saturation is 100%, which is normal. Head is normocephalic and atraumatic. PERRLA, EOMI. Oropharynx is clear. Neck is nontender and supple without adenopathy or JVD. Back is nontender and there is no CVA tenderness. Lungs are clear without rales, wheezes, or rhonchi. Chest is nontender. Heart has regular rate and rhythm without murmur. Abdomen is soft, flat, with mild tenderness in the epigastrium and right upper quadrant.  There is no rebound or guarding.  There are no masses or hepatosplenomegaly and peristalsis is hypoactive. Extremities have no  cyanosis or edema, full range of motion is present. Skin is warm and dry without rash. Neurologic: Mental status is normal, cranial nerves are intact, there are no motor or sensory deficits.  ED Treatments / Results  Labs (all labs ordered are listed, but only abnormal results are displayed) Labs Reviewed  URINALYSIS, ROUTINE W REFLEX MICROSCOPIC - Abnormal; Notable for the following components:      Result Value   Color, Urine COLORLESS (*)    Specific Gravity, Urine 1.003 (*)    Leukocytes, UA SMALL (*)    Squamous Epithelial / LPF 0-5 (*)    All other components within normal limits  COMPREHENSIVE METABOLIC PANEL - Abnormal; Notable for the following components:   Glucose, Bld 108 (*)    Calcium 10.5 (*)    All other components within normal limits  LIPASE, BLOOD  CBC WITH DIFFERENTIAL/PLATELET   Procedures Procedures   Medications Ordered in ED Medications  gi cocktail (Maalox,Lidocaine,Donnatal) (30 mLs Oral Given 05/26/17 0150)     Initial Impression / Assessment and Plan / ED Course  I have reviewed the triage vital signs and the nursing notes.  Pertinent labs & imaging results that were available during my care of the patient were reviewed by me and considered in my medical decision making (see chart for details).  Epigastric burning strongly suggestive of gastritis, ulcer, or GERD.  Urinalysis shows no evidence of infection and has no specific gravity suggesting that her urinary frequency is because of volume of water that she is drinking.  Old records are reviewed confirming office visit on March 19 which time she was complaining of urinary frequency and some chest discomfort.  2:39 AM He had good relief of symptoms with GI cocktail.  Laboratory work-up is reassuring.  Normal WBC, normal electrolytes, normal renal function, normal hepatic function.  She is taking daily pantoprazole.  She is advised to increase  this to twice a day.  Use over-the-counter antacids as  needed.  Follow-up with PCP in 1 week.  Final Clinical Impressions(s) / ED Diagnoses   Final diagnoses:  Gastroesophageal reflux disease, esophagitis presence not specified    ED Discharge Orders    None       Delora Fuel, MD 56/94/37 [id number]

## 2017-05-26 NOTE — ED Triage Notes (Addendum)
Pt reports dysuria, urinary frequency, lower back pain, and burning in her abdomen since last Saturday. Pt also reports diarrhea.  Pt states she saw Dr. Wolfgang Phoenix and they checked her for a uti but she never heard back.  Pt also reports being anxious.

## 2017-05-27 ENCOUNTER — Ambulatory Visit: Payer: BLUE CROSS/BLUE SHIELD | Admitting: Nurse Practitioner

## 2017-05-27 ENCOUNTER — Encounter: Payer: Self-pay | Admitting: Nurse Practitioner

## 2017-05-27 ENCOUNTER — Telehealth: Payer: Self-pay | Admitting: Family Medicine

## 2017-05-27 VITALS — BP 164/82 | HR 80 | Temp 97.1°F | Ht 65.0 in | Wt 186.2 lb

## 2017-05-27 DIAGNOSIS — R634 Abnormal weight loss: Secondary | ICD-10-CM | POA: Insufficient documentation

## 2017-05-27 DIAGNOSIS — K219 Gastro-esophageal reflux disease without esophagitis: Secondary | ICD-10-CM | POA: Diagnosis not present

## 2017-05-27 DIAGNOSIS — K589 Irritable bowel syndrome without diarrhea: Secondary | ICD-10-CM

## 2017-05-27 DIAGNOSIS — E039 Hypothyroidism, unspecified: Secondary | ICD-10-CM

## 2017-05-27 DIAGNOSIS — Z780 Asymptomatic menopausal state: Secondary | ICD-10-CM

## 2017-05-27 MED ORDER — PANTOPRAZOLE SODIUM 40 MG PO TBEC: 40.0000 mg | DELAYED_RELEASE_TABLET | Freq: Two times a day (BID) | ORAL | 3 refills | Status: DC

## 2017-05-27 MED ORDER — CIPROFLOXACIN HCL 250 MG PO TABS: ORAL_TABLET | ORAL | 0 refills | Status: DC

## 2017-05-27 MED ORDER — SUCRALFATE 1 G PO TABS: 1.0000 g | ORAL_TABLET | Freq: Three times a day (TID) | ORAL | 1 refills | Status: DC

## 2017-05-27 NOTE — Patient Instructions (Signed)
1. I have refilled your Protonix for you. 2. Take this twice a day as recommended by the emergency department. 3. Restart your Linzess, every other day, alternating between 145 mcg and 72 mcg dosing. 4. I sent in Carafate to your pharmacy for breakthrough heartburn symptoms/burning. 5. Follow-up in 6 weeks.   Have a [place] Day!    At [facility] we value your feedback. You may receive a survey about your visit today. Please share your experience as we strive to create trusing relationships with our patients to provide genuine, compassionate, quality care.

## 2017-05-27 NOTE — Telephone Encounter (Signed)
Patient is aware of all,but states Dr. Mosetta Anis told her you would be taking over the elevated Ca levels and that she needed a bone density test . She is aware of Uti and aware we have sent in the rx.

## 2017-05-27 NOTE — Telephone Encounter (Signed)
Liver and kidney and thyroid fine, calcium has gone up a bit, is pt still seeing specialist that we sent her to for that?  Pt does have klebsiella uti, very mild, tell pt I reviewed er notes and no way her pain is coming from a mild bladder infxn, rx with ciro 250 bid five d

## 2017-05-27 NOTE — Telephone Encounter (Signed)
Patient called today stating she had to go to the Ed over the weekend because she was doubled over in abd pain. She states she is having urinary frequency and burning sensations. She states she just left the Gi doc for burning in her stomach.She states she has an appt on June 04, 2017 with OBGYN as she can not figure why she is burning with urinating. She called on Friday wanting to know her results of her test she did when she was here last week. That message was never forwarded. Looks like she does have a urinary tract infection Klebsiella this cx was done 05/21/2017. Please advise.

## 2017-05-27 NOTE — Telephone Encounter (Signed)
Please see other note sent 05/27/2017

## 2017-05-27 NOTE — Telephone Encounter (Signed)
Patient said she had to go to the ER over the weekend because she was doubled over in pain.  She is currently at her gastroenterologist, but she wanted to let Dr. Richardson Landry know that her urine has been really yellow and she keeps peeing a lot.

## 2017-05-27 NOTE — Progress Notes (Signed)
Referring Provider: [name], MD Primary Care Physician:  [name], MD Primary GI:  Dr. Gala Romney  Chief Complaint  Patient presents with  . Abdominal Pain    went to ER    HPI:   Kaitlin [name] is a 62 y.o. female who presents dilation of abdominal pain.  The patient was last seen in our office 10/09/2016 for GERD and chronic idiopathic constipation.  She is on Linzess daily but too much provides abdominal cramps and diarrhea, taking it every other day.  Still with some constipation on every other day dosing.  Protonix daily.  Dexilant was too expensive.  Some breakthrough symptoms noted.  Due for surveillance colonoscopy 2020.  Recommended alternate Linzess 145 mcg and 72 mcg every other day.  Protonix daily.  Follow-up in 2 weeks via phone progress report.  Follow-up in 6 months.    No progress report was called.  She was seen in the emergency department yesterday for epigastric burning abdominal pain.  The pain radiated to right upper quadrant and lower abdomen.  Nothing was helping.  Noted urinary frequency.  Noted constipation.  Status post cholecystectomy.  BC, CMP, lipase all essentially normal.  No evidence of UTI.  Cocktail provided relief.  Recommended increase Protonix to twice daily, over-the-counter antacids as needed.  Follow-up with primary care in 1 week.  Today she states she's had worsening epigastric burning. Started worsening 2 weeks ago when she ate lasagna when she had hot/cold spells, had a bowel movement which was "real heavy" and followed by diarrhea. Right sided abdominal pain, having a bowel movement about twice a day, hard stools. Stopped taking Linzess about a month ago "because my bowels were doing well." Has a lot of gas. Admits nausea. Denies vomiting, melena. Felt she saw a small amount of red blood only once after a significant amount of diarrhea. Right-sided pain did improved with bowel movements. Denies fever, chills.  At the end of her  visit she mentioned weight loss. Objectively she seems to have lost about 10-12 lbs int he past 6 months. She is due soon for colonoscopy. At her follow-up visit may need to proceed with early interval TCS/EGD to further evaluate, pending symptoms and weight progression.  Past Medical History:  Diagnosis Date  . Anxiety   . BMI 30.0-30.9,adult 2008 182 LBS   2009 194 LBS  . Colon polyp APR 2008  . Depression   . Essential hypertension, benign   . Folliculitis 06/04/270  . Helicobacter pylori gastritis AUG 2008 PREVPAC: nausea-->HELIDAC: GI UPSET   HB 14.1 NL HFP H. PYLORI STOOL Ag NEG  . Hypothyroidism   . Irritable bowel syndrome   . Kidney stones   . Mixed hyperlipidemia   . Mixed stress and urge urinary incontinence 09/26/2012  . Panic attacks   . Polyp of colon, hyperplastic   . Reflux   . Tubulovillous adenoma 08/07/10   Colonoscopy w/ Dr Oneida Alar w/ 7 polyps removed-bx showed tubular adenomas.  Largest 1cm-hepatic flexure polyp->TA.      Past Surgical History:  Procedure Laterality Date  . CHOLECYSTECTOMY    . COLONOSCOPY  APR 2008   AC/[place] SIMPLE ADENOMA  . COLONOSCOPY  08/07/2010   Dr. Raynald Kemp adenomas and tubulovillous adenoma; needs surveillance 2015  . COLONOSCOPY N/A 09/18/2013   Dr.Rourk- normal rectum, 2 diminutive polys in the mid sigmoid segment o/w the remainder of the colonic mucosa appeared normal.hyperplastic poylps  . ESOPHAGOGASTRODUODENOSCOPY N/A 11/29/2014   RMR: Gastric erosions.  Small hiatal hernia. Status post biopsy.   Marland Kitchen MASS EXCISION  02/28/2011   Procedure: EXCISION MASS;  Surgeon: Donato Heinz, MD;  Location: [facility];  Service: General;  Laterality: Right;  soft tissue mass  . UPPER GASTROINTESTINAL ENDOSCOPY  AUG 2008 PAIN/NAUSEA   H. PYLORI treated    Current Outpatient Medications  Medication Sig Dispense Refill  . blood glucose meter kit and supplies Dispense based on patient and insurance preference. Test once a day E11.9 1 each 0  .  Cholecalciferol (VITAMIN D3) 1000 units CAPS Take 2,000 Units by mouth daily. Doesn't take everyday    . enalapril (VASOTEC) 20 MG tablet TAKE 1 TABLET (20 MG TOTAL) BY MOUTH AT BEDTIME. 30 tablet 5  . furosemide (LASIX) 20 MG tablet Take 1 tablet (20 mg total) by mouth daily. 30 tablet 5  . glucose blood test strip OneTouch Ultra Blue Test Strip  USE TO TEST ONCE DAILY    . levothyroxine (SYNTHROID, LEVOTHROID) 50 MCG tablet TAKE 1 TABLET (50 MCG TOTAL) BY MOUTH DAILY. 30 tablet 5  . lovastatin (MEVACOR) 40 MG tablet TAKE 1 TABLET BY MOUTH EVERYDAY AT BEDTIME 90 tablet 1  . mometasone (ELOCON) 0.1 % cream Apply to affected area twice daily as needed for itching 60 g 0  . ondansetron (ZOFRAN ODT) 4 MG disintegrating tablet Take 1 tablet (4 mg total) by mouth every 8 (eight) hours as needed for nausea or vomiting. 30 tablet 0  . OVER THE COUNTER MEDICATION Vitamin D 2,000 u Daily    . pantoprazole (PROTONIX) 40 MG tablet Take 1 tablet (40 mg total) by mouth daily. 30 tablet 5  . triamcinolone cream (KENALOG) 0.1 % Apply 1 application 2 (two) times daily topically. 30 g 0  . Verapamil HCl CR 300 MG CP24 Take 1 capsule (300 mg total) by mouth at bedtime. 30 capsule 5   No current facility-administered medications for this visit.     Allergies as of 05/27/2017 - Review Complete 05/27/2017  Allergen Reaction Noted  . Asa [aspirin] Other (See Comments) 06/09/2012  . Augmentin [amoxicillin-pot clavulanate] Diarrhea 06/09/2012  . Levaquin [levofloxacin in d5w] Other (See Comments) 07/14/2012  . Rocephin [ceftriaxone sodium in dextrose] Hives and Other (See Comments) 06/09/2012  . Valtrex [valacyclovir hcl] Hives and Other (See Comments) 06/09/2012  . Zoloft [sertraline hcl] Other (See Comments) 06/09/2012    Family History  Problem Relation Age of Onset  . Coronary artery disease Mother   . Hypertension Mother   . Heart attack Mother   . Heart disease Mother   . Suicidality Mother   .  Coronary artery disease Brother        Age 1  . Heart attack Brother   . Heart disease Brother   . Diabetes Sister   . Diabetes Sister   . Colon cancer Neg Hx   . Anesthesia problems Neg Hx   . Hypotension Neg Hx   . Malignant hyperthermia Neg Hx   . Pseudochol deficiency Neg Hx     Social History   Socioeconomic History  . Marital status: Married    Spouse name: Not on file  . Number of children: Not on file  . Years of education: Not on file  . Highest education level: Not on file  Occupational History  . Not on file  Social Needs  . Financial resource strain: Not on file  . Food insecurity:    Worry: Not on file    Inability: Not on  file  . Transportation needs:    Medical: Not on file    Non-medical: Not on file  Tobacco Use  . Smoking status: Never Smoker  . Smokeless tobacco: Never Used  . Tobacco comment: Never Smoked  Substance and Sexual Activity  . Alcohol use: No  . Drug use: No  . Sexual activity: Never  Lifestyle  . Physical activity:    Days per week: Not on file    Minutes per session: Not on file  . Stress: Not on file  Relationships  . Social connections:    Talks on phone: Not on file    Gets together: Not on file    Attends religious service: Not on file    Active member of club or organization: Not on file    Attends meetings of clubs or organizations: Not on file    Relationship status: Not on file  Other Topics Concern  . Not on file  Social History Narrative  . Not on file    Review of Systems: General: Negative for anorexia, weight loss, fever, chills, fatigue, weakness. ENT: Negative for hoarseness, difficulty swallowing. CV: Negative for chest pain, angina, palpitations, peripheral edema.  Respiratory: Negative for dyspnea at rest, cough, sputum, wheezing.  GI: See history of present illness. Endo: Negative for unusual weight change.  Heme: Negative for bruising or bleeding. Allergy: Negative for rash or hives.   Physical  Exam: BP (!) 164/82   Pulse 80   Temp (!) 97.1 F (36.2 C) (Oral)   Ht '5\' 5"'$  (1.651 m)   Wt 186 lb 3.2 oz (84.5 kg)   BMI 30.99 kg/m  General:   Alert and oriented. Pleasant and cooperative. Well-nourished and well-developed.  Eyes:  Without icterus, sclera clear and conjunctiva pink.  Ears:  Normal auditory acuity. Cardiovascular:  S1, S2 present without murmurs appreciated. Extremities without clubbing or edema. Respiratory:  Clear to auscultation bilaterally. No wheezes, rales, or rhonchi. No distress.  Gastrointestinal:  +BS, soft, non-tender and non-distended. No HSM noted. No guarding or rebound. No masses appreciated.  Rectal:  Deferred  Musculoskalatal:  Symmetrical without gross deformities. Neurologic:  Alert and oriented x4;  grossly normal neurologically. Psych:  Alert and cooperative. Normal mood and affect. Heme/Lymph/Immune: No excessive bruising noted.    05/27/2017 9:08 AM   Disclaimer: This note was dictated with voice recognition software. Similar sounding words can inadvertently be transcribed and may not be corrected upon review.

## 2017-05-28 ENCOUNTER — Ambulatory Visit (HOSPITAL_COMMUNITY): Payer: Self-pay | Admitting: Licensed Clinical Social Worker

## 2017-05-29 NOTE — Progress Notes (Signed)
cc'ed to pcp °

## 2017-05-29 NOTE — Assessment & Plan Note (Signed)
She with a history of IBS constipation type.  Has had some worsening of her bowel movements was previously on Linzess but stopped taking because she was doing better.  Recommended she restart her Linzess at her previous dose.  Follow-up in 6 months.

## 2017-05-29 NOTE — Assessment & Plan Note (Signed)
Noted worsening epigastric burning which started 2 weeks prior when she ate lasagna and had associated hot/cold spells.  I will refill her Protonix at this time for to take twice a day (rather than once a day dosing she is currently on).  She is to call us in 1-2 weeks and let us know if she is improving.  I have also sent in Carafate 1 g 3 times a day for breakthrough symptoms.  Follow-up in 6 weeks.

## 2017-05-29 NOTE — Assessment & Plan Note (Signed)
At the close of the visit when exiting the room she mentioned some weight loss.  It appears objectively she has lost some weight in the previous 6 months.  She is coming due for colonoscopy soon.  At her follow-up visit we may need to consider repeating her colonoscopy a little bit early.  Depending on the progression of her GERD symptoms there may be justification for an upper endoscopy at the same time as well.  Continue to monitor weight, recheck in 6 weeks with her follow-up visit.

## 2017-05-30 DIAGNOSIS — F408 Other phobic anxiety disorders: Secondary | ICD-10-CM | POA: Diagnosis not present

## 2017-05-31 ENCOUNTER — Telehealth: Payer: Self-pay

## 2017-05-31 NOTE — Telephone Encounter (Signed)
Orders placed and the pt is aware of all. She is schedule for 06/27/2017 at 9:45 am for her mammo will have the bone density done the same day. She is aware to hold calcium for 48 hours prior and wear two pc clothes take med list with her . She is also aware to have labs drawn in one month for recheck. Orders placed.

## 2017-05-31 NOTE — Telephone Encounter (Signed)
Lets do bone density test  Repeat calcium plus pth level in one month

## 2017-05-31 NOTE — Telephone Encounter (Signed)
Printed labs & mailed per pt request

## 2017-05-31 NOTE — Addendum Note (Signed)
Addended by: Karle Barr on: 05/31/2017 02:33 PM   Modules accepted: Orders

## 2017-06-03 ENCOUNTER — Other Ambulatory Visit: Payer: Self-pay | Admitting: Family Medicine

## 2017-06-04 ENCOUNTER — Ambulatory Visit: Payer: BLUE CROSS/BLUE SHIELD | Admitting: Obstetrics & Gynecology

## 2017-06-04 ENCOUNTER — Encounter: Payer: Self-pay | Admitting: Obstetrics & Gynecology

## 2017-06-04 VITALS — BP 132/80 | HR 56 | Ht 65.0 in | Wt 188.0 lb

## 2017-06-04 DIAGNOSIS — N760 Acute vaginitis: Secondary | ICD-10-CM

## 2017-06-04 DIAGNOSIS — B9689 Other specified bacterial agents as the cause of diseases classified elsewhere: Secondary | ICD-10-CM | POA: Diagnosis not present

## 2017-06-04 MED ORDER — METRONIDAZOLE 0.75 % VA GEL: VAGINAL | 0 refills | Status: DC

## 2017-06-04 NOTE — Progress Notes (Signed)
Chief Complaint  Patient presents with  . Vaginal Bleeding    and vaginal odor      62 y.o. G1P1 No LMP recorded. Patient is postmenopausal. The current method of family planning is post menopausal status.  Outpatient Encounter Medications as of 06/04/2017  Medication Sig  . ALPRAZolam (XANAX) 1 MG tablet Take 1 mg by mouth at bedtime as needed for anxiety.  . blood glucose meter kit and supplies Dispense based on patient and insurance preference. Test once a day E11.9  . Cholecalciferol (VITAMIN D3) 1000 units CAPS Take 2,000 Units by mouth daily. Doesn't take everyday  . ciprofloxacin (CIPRO) 250 MG tablet One po BID for five days  . enalapril (VASOTEC) 20 MG tablet TAKE 1 TABLET (20 MG TOTAL) BY MOUTH AT BEDTIME.  Marland Kitchen escitalopram (LEXAPRO) 5 MG tablet Take 5 mg by mouth daily.  . furosemide (LASIX) 20 MG tablet Take 1 tablet (20 mg total) by mouth daily.  Marland Kitchen glucose blood test strip OneTouch Ultra Blue Test Strip  USE TO TEST ONCE DAILY  . levothyroxine (SYNTHROID, LEVOTHROID) 50 MCG tablet TAKE 1 TABLET (50 MCG TOTAL) BY MOUTH DAILY.  Marland Kitchen lovastatin (MEVACOR) 40 MG tablet TAKE 1 TABLET BY MOUTH EVERYDAY AT BEDTIME  . mometasone (ELOCON) 0.1 % cream Apply to affected area twice daily as needed for itching  . pantoprazole (PROTONIX) 40 MG tablet Take 1 tablet (40 mg total) by mouth 2 (two) times daily before a meal.  . triamcinolone cream (KENALOG) 0.1 % Apply 1 application 2 (two) times daily topically.  . Verapamil HCl CR 300 MG CP24 Take 1 capsule (300 mg total) by mouth at bedtime.  . metroNIDAZOLE (METROGEL VAGINAL) 0.75 % vaginal gel Nightly x 5 nights  . ondansetron (ZOFRAN ODT) 4 MG disintegrating tablet Take 1 tablet (4 mg total) by mouth every 8 (eight) hours as needed for nausea or vomiting. (Patient not taking: Reported on 06/04/2017)  . OVER THE COUNTER MEDICATION Vitamin D 2,000 u Daily  . sucralfate (CARAFATE) 1 g tablet Take 1 tablet (1 g total) by mouth 4 (four)  times daily -  with meals and at bedtime.   No facility-administered encounter medications on file as of 06/04/2017.     Subjective Kaitlin [name] presents today with a couple of complaints Primarily she has had a vaginal odor for the past couple of weeks She states she has had no real itching and no significant discharge no burning or irritation She has not been on antibiotics but she recently did have what sounds like a viral illness She states nothing that she is done is really made it any better or worse that she has been persistent She describes odor is unpleasant slightly fishy not sweet smelling but not overwhelming  She also states that she had one time of vaginal spotting Past Medical History:  Diagnosis Date  . Anxiety   . BMI 30.0-30.9,adult 2008 182 LBS   2009 194 LBS  . Colon polyp APR 2008  . Depression   . Essential hypertension, benign   . Folliculitis 3/71/0626  . Helicobacter pylori gastritis AUG 2008 PREVPAC: nausea-->HELIDAC: GI UPSET   HB 14.1 NL HFP H. PYLORI STOOL Ag NEG  . Hypothyroidism   . Irritable bowel syndrome   . Kidney stones   . Mixed hyperlipidemia   . Mixed stress and urge urinary incontinence 09/26/2012  . Panic attacks   . Polyp of colon, hyperplastic   . Reflux   .  Tubulovillous adenoma 08/07/10   Colonoscopy w/ Dr Oneida Alar w/ 7 polyps removed-bx showed tubular adenomas.  Largest 1cm-hepatic flexure polyp->TA.      Past Surgical History:  Procedure Laterality Date  . CHOLECYSTECTOMY    . COLONOSCOPY  APR 2008   AC/[place] SIMPLE ADENOMA  . COLONOSCOPY  08/07/2010   Dr. Raynald Kemp adenomas and tubulovillous adenoma; needs surveillance 2015  . COLONOSCOPY N/A 09/18/2013   Dr.Rourk- normal rectum, 2 diminutive polys in the mid sigmoid segment o/w the remainder of the colonic mucosa appeared normal.hyperplastic poylps  . ESOPHAGOGASTRODUODENOSCOPY N/A 11/29/2014   RMR: Gastric erosions. Small hiatal hernia. Status post biopsy.   Marland Kitchen MASS EXCISION   02/28/2011   Procedure: EXCISION MASS;  Surgeon: Donato Heinz, MD;  Location: [facility];  Service: General;  Laterality: Right;  soft tissue mass  . UPPER GASTROINTESTINAL ENDOSCOPY  AUG 2008 PAIN/NAUSEA   H. PYLORI treated    OB History    Gravida  1   Para  1   Term      Preterm      AB      Living        SAB      TAB      Ectopic      Multiple      Live Births              Allergies  Allergen Reactions  . Asa [Aspirin] Other (See Comments)    Increases anxiety  . Augmentin [Amoxicillin-Pot Clavulanate] Diarrhea  . Levaquin [Levofloxacin In D5w] Other (See Comments)    chestpain  . Rocephin [Ceftriaxone Sodium In Dextrose] Hives and Other (See Comments)    blisters  . Valtrex [Valacyclovir Hcl] Hives and Other (See Comments)  . Zoloft [Sertraline Hcl] Other (See Comments)    Too strong    Social History   Socioeconomic History  . Marital status: Married    Spouse name: Not on file  . Number of children: Not on file  . Years of education: Not on file  . Highest education level: Not on file  Occupational History  . Not on file  Social Needs  . Financial resource strain: Not on file  . Food insecurity:    Worry: Not on file    Inability: Not on file  . Transportation needs:    Medical: Not on file    Non-medical: Not on file  Tobacco Use  . Smoking status: Never Smoker  . Smokeless tobacco: Never Used  . Tobacco comment: Never Smoked  Substance and Sexual Activity  . Alcohol use: No  . Drug use: No  . Sexual activity: Never  Lifestyle  . Physical activity:    Days per week: Not on file    Minutes per session: Not on file  . Stress: Not on file  Relationships  . Social connections:    Talks on phone: Not on file    Gets together: Not on file    Attends religious service: Not on file    Active member of club or organization: Not on file    Attends meetings of clubs or organizations: Not on file    Relationship status: Not on file    Other Topics Concern  . Not on file  Social History Narrative  . Not on file    Family History  Problem Relation Age of Onset  . Coronary artery disease Mother   . Hypertension Mother   . Heart attack Mother   .  Heart disease Mother   . Suicidality Mother   . Coronary artery disease Brother        Age 61  . Heart attack Brother   . Heart disease Brother   . Diabetes Sister   . Diabetes Sister   . Colon cancer Neg Hx   . Anesthesia problems Neg Hx   . Hypotension Neg Hx   . Malignant hyperthermia Neg Hx   . Pseudochol deficiency Neg Hx     Medications:       Current Outpatient Medications:  .  ALPRAZolam (XANAX) 1 MG tablet, Take 1 mg by mouth at bedtime as needed for anxiety., Disp: , Rfl:  .  blood glucose meter kit and supplies, Dispense based on patient and insurance preference. Test once a day E11.9, Disp: 1 each, Rfl: 0 .  Cholecalciferol (VITAMIN D3) 1000 units CAPS, Take 2,000 Units by mouth daily. Doesn't take everyday, Disp: , Rfl:  .  ciprofloxacin (CIPRO) 250 MG tablet, One po BID for five days, Disp: 10 tablet, Rfl: 0 .  enalapril (VASOTEC) 20 MG tablet, TAKE 1 TABLET (20 MG TOTAL) BY MOUTH AT BEDTIME., Disp: 30 tablet, Rfl: 5 .  escitalopram (LEXAPRO) 5 MG tablet, Take 5 mg by mouth daily., Disp: , Rfl:  .  furosemide (LASIX) 20 MG tablet, Take 1 tablet (20 mg total) by mouth daily., Disp: 30 tablet, Rfl: 5 .  glucose blood test strip, OneTouch Ultra Blue Test Strip  USE TO TEST ONCE DAILY, Disp: , Rfl:  .  levothyroxine (SYNTHROID, LEVOTHROID) 50 MCG tablet, TAKE 1 TABLET (50 MCG TOTAL) BY MOUTH DAILY., Disp: 30 tablet, Rfl: 5 .  lovastatin (MEVACOR) 40 MG tablet, TAKE 1 TABLET BY MOUTH EVERYDAY AT BEDTIME, Disp: 90 tablet, Rfl: 1 .  mometasone (ELOCON) 0.1 % cream, Apply to affected area twice daily as needed for itching, Disp: 60 g, Rfl: 0 .  pantoprazole (PROTONIX) 40 MG tablet, Take 1 tablet (40 mg total) by mouth 2 (two) times daily before a meal., Disp: 60  tablet, Rfl: 3 .  triamcinolone cream (KENALOG) 0.1 %, Apply 1 application 2 (two) times daily topically., Disp: 30 g, Rfl: 0 .  Verapamil HCl CR 300 MG CP24, Take 1 capsule (300 mg total) by mouth at bedtime., Disp: 30 capsule, Rfl: 5 .  metroNIDAZOLE (METROGEL VAGINAL) 0.75 % vaginal gel, Nightly x 5 nights, Disp: 70 g, Rfl: 0 .  ondansetron (ZOFRAN ODT) 4 MG disintegrating tablet, Take 1 tablet (4 mg total) by mouth every 8 (eight) hours as needed for nausea or vomiting. (Patient not taking: Reported on 06/04/2017), Disp: 30 tablet, Rfl: 0 .  OVER THE COUNTER MEDICATION, Vitamin D 2,000 u Daily, Disp: , Rfl:  .  sucralfate (CARAFATE) 1 g tablet, Take 1 tablet (1 g total) by mouth 4 (four) times daily -  with meals and at bedtime., Disp: 15 tablet, Rfl: 1  Objective Blood pressure 132/80, pulse (!) 56, height 5' 5" (1.651 m), weight 188 lb (85.3 kg).  General WDWN female NAD Vulva:  normal appearing vulva with no masses, tenderness or lesions Vagina:  normal mucosa, no discharge Cervix:  no cervical motion tenderness and no lesions Uterus:  normal size, contour, position, consistency, mobility, non-tender Adnexa: ovaries:present,     Pertinent ROS No burning with urination, frequency or urgency No nausea, vomiting or diarrhea Nor fever chills or other constitutional symptoms   Labs or studies     Impression Diagnoses this Encounter::   ICD-10-CM  1. BV (bacterial vaginosis) N76.0    B96.89     Established relevant diagnosis(es):   Plan/Recommendations: Meds ordered this encounter  Medications  . metroNIDAZOLE (METROGEL VAGINAL) 0.75 % vaginal gel    Sig: Nightly x 5 nights    Dispense:  70 g    Refill:  0    Labs or Scans Ordered: No orders of the defined types were placed in this encounter.   Management:: metrogel qohs x 3 and then re evaluate the odor  Follow up Return if symptoms worsen or fail to improve.        All questions were answered.

## 2017-06-07 NOTE — Telephone Encounter (Signed)
Refill only this prescription

## 2017-06-19 DIAGNOSIS — L918 Other hypertrophic disorders of the skin: Secondary | ICD-10-CM | POA: Diagnosis not present

## 2017-06-20 ENCOUNTER — Ambulatory Visit (HOSPITAL_COMMUNITY): Payer: BLUE CROSS/BLUE SHIELD | Admitting: Licensed Clinical Social Worker

## 2017-06-25 ENCOUNTER — Telehealth: Payer: Self-pay | Admitting: Family Medicine

## 2017-06-25 NOTE — Telephone Encounter (Signed)
Pt is requesting a prescription for generic test strips for her one touch meter. Pt states that the brand name went up to 60 dollars. Please advise.    [facility]

## 2017-06-25 NOTE — Telephone Encounter (Signed)
Rx faxed and pt is aware.

## 2017-06-25 NOTE — Telephone Encounter (Signed)
I spoke with the pt and she states she needs one touch ultra. She tests once daily. I have written the rx and awaiting signature from Dr. Liliane Channel [facility].

## 2017-06-27 ENCOUNTER — Encounter (HOSPITAL_COMMUNITY): Payer: Self-pay

## 2017-06-27 ENCOUNTER — Ambulatory Visit (HOSPITAL_COMMUNITY)
Admission: RE | Admit: 2017-06-27 | Discharge: 2017-06-27 | Disposition: A | Discharge status: Home / Self Care | Payer: BLUE CROSS/BLUE SHIELD | Source: Ambulatory Visit | Attending: Obstetrics & Gynecology | Admitting: Obstetrics & Gynecology

## 2017-06-27 ENCOUNTER — Ambulatory Visit (HOSPITAL_COMMUNITY)
Admission: RE | Admit: 2017-06-27 | Discharge: 2017-06-27 | Disposition: A | Discharge status: Home / Self Care | Payer: BLUE CROSS/BLUE SHIELD | Source: Ambulatory Visit | Attending: Family Medicine | Admitting: Family Medicine

## 2017-06-27 DIAGNOSIS — Z1231 Encounter for screening mammogram for malignant neoplasm of breast: Secondary | ICD-10-CM | POA: Diagnosis not present

## 2017-06-27 DIAGNOSIS — E213 Hyperparathyroidism, unspecified: Secondary | ICD-10-CM | POA: Diagnosis not present

## 2017-06-27 DIAGNOSIS — Z780 Asymptomatic menopausal state: Secondary | ICD-10-CM | POA: Diagnosis not present
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
## 2017-07-01 ENCOUNTER — Encounter (HOSPITAL_COMMUNITY): Payer: Self-pay | Admitting: Psychiatry

## 2017-07-01 ENCOUNTER — Ambulatory Visit (INDEPENDENT_AMBULATORY_CARE_PROVIDER_SITE_OTHER): Payer: BLUE CROSS/BLUE SHIELD | Admitting: Psychiatry

## 2017-07-01 VITALS — BP 163/95 | HR 79 | Ht 65.0 in | Wt 188.0 lb

## 2017-07-01 DIAGNOSIS — F40218 Other animal type phobia: Secondary | ICD-10-CM | POA: Diagnosis not present

## 2017-07-01 DIAGNOSIS — F419 Anxiety disorder, unspecified: Secondary | ICD-10-CM | POA: Diagnosis not present

## 2017-07-01 DIAGNOSIS — F4310 Post-traumatic stress disorder, unspecified: Secondary | ICD-10-CM

## 2017-07-01 DIAGNOSIS — F331 Major depressive disorder, recurrent, moderate: Secondary | ICD-10-CM | POA: Diagnosis not present

## 2017-07-01 DIAGNOSIS — R450 Nervousness: Secondary | ICD-10-CM

## 2017-07-01 NOTE — Patient Instructions (Addendum)
1. Continue lexapro 5 mg daily at this time 2. Would recommend to switch to other antidepressant to be effective for your mood. And also would recommend to taper off Xanax 3. Would recommend seeing a therapist 4. Return to clinic as needed
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
## 2017-07-09 ENCOUNTER — Ambulatory Visit (HOSPITAL_COMMUNITY): Payer: BLUE CROSS/BLUE SHIELD | Admitting: Psychiatry

## 2017-07-12 ENCOUNTER — Telehealth: Payer: Self-pay | Admitting: Family Medicine

## 2017-07-12 NOTE — Telephone Encounter (Signed)
Patient called back and said that [facility] is going to send her a Contour Next meter and supplies to go along with it.  She said she will need a refill one the strips once she runs out, but will call back to request this when needed.

## 2017-07-12 NOTE — Telephone Encounter (Signed)
Patient said that she cannot afford the One Touch testing strips.  She wants to know if there is a cheaper brand that we know of?   [facility]

## 2017-07-16 ENCOUNTER — Encounter: Payer: Self-pay | Admitting: Internal Medicine

## 2017-07-16 ENCOUNTER — Ambulatory Visit: Payer: BLUE CROSS/BLUE SHIELD | Admitting: Internal Medicine

## 2017-07-16 ENCOUNTER — Telehealth: Payer: Self-pay

## 2017-07-16 VITALS — BP 122/78 | HR 66 | Temp 97.1°F | Ht 65.0 in | Wt 188.2 lb

## 2017-07-16 DIAGNOSIS — K219 Gastro-esophageal reflux disease without esophagitis: Secondary | ICD-10-CM | POA: Diagnosis not present

## 2017-07-16 DIAGNOSIS — R1013 Epigastric pain: Secondary | ICD-10-CM | POA: Diagnosis not present

## 2017-07-16 DIAGNOSIS — Z86010 Personal history of colonic polyps: Secondary | ICD-10-CM

## 2017-07-16 NOTE — Telephone Encounter (Signed)
Per Dr. Gala Romney, called Rx to Shaun at [facility]. Carafate suspension   One gram before meals and at bedtime as needed. Dispense 60 doses with 3 refills.

## 2017-07-16 NOTE — Patient Instructions (Signed)
Continue Protonix 40 mg twice daily  Add Carafate suspension - 1 gram before meals and at bedtime as needed (disp 60 doses - 3 refills)  Colonoscopy 2020  Office visit here in 3 months

## 2017-07-16 NOTE — Progress Notes (Signed)
Primary Care Physician:  [name], MD Primary Gastroenterologist:  Dr. Gala Romney  Pre-Procedure History & Physical: HPI:  Kaitlin [name] is a 62 y.o. female here for Follow-up of GERD, constipation, epigastric pain and weight loss.  Patient had increased epigastric pain over the last several weeks. Went to the ED on one occasion. Patient states stress related. She had been down 12 pounds; gained 2 back since her last visit. Appetite has improved significantly. Felt Linzess was too strong. Currently, not taking anything for her bowels and moving 1-3 times daily. No melena or rectal bleeding. No nausea or vomiting. Reflux symptoms well controlled on Protonix 40 mg twice daily. Felt she did even better when she had Carafate on hand - taken during attacks of epigastric pain.  History of tubulovillous adenoma; due for surveillance colonoscopy 2020.   History of H. Pylori treated with Prevpac in 2014. Gallbladders out.  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  Past Medical History:  Diagnosis Date  . Anxiety   . BMI 30.0-30.9,adult 2008 182 LBS   2009 194 LBS  . Colon polyp APR 2008  . Depression   . Essential hypertension, benign   . Folliculitis 2/68/3419  . Helicobacter pylori gastritis AUG 2008 PREVPAC: nausea-->HELIDAC: GI UPSET   HB 14.1 NL HFP H. PYLORI STOOL Ag NEG  . Hypothyroidism   . Irritable bowel syndrome   . Kidney stones   . Mixed hyperlipidemia   . Mixed stress and urge urinary incontinence 09/26/2012  . Panic attacks   . Polyp of colon, hyperplastic   . Reflux   . Tubulovillous adenoma 08/07/10   Colonoscopy w/ Dr Oneida Alar w/ 7 polyps removed-bx showed tubular adenomas.  Largest 1cm-hepatic flexure polyp->TA.      Past Surgical History:  Procedure Laterality Date  . CHOLECYSTECTOMY    . COLONOSCOPY  APR 2008   AC/[place] SIMPLE ADENOMA  . COLONOSCOPY  08/07/2010   Dr. Raynald Kemp adenomas and tubulovillous adenoma; needs surveillance 2015   . COLONOSCOPY N/A 09/18/2013   Dr.Moira Umholtz- normal rectum, 2 diminutive polys in the mid sigmoid segment o/w the remainder of the colonic mucosa appeared normal.hyperplastic poylps  . ESOPHAGOGASTRODUODENOSCOPY N/A 11/29/2014   RMR: Gastric erosions. Small hiatal hernia. Status post biopsy.   Marland Kitchen MASS EXCISION  02/28/2011   Procedure: EXCISION MASS;  Surgeon: Donato Heinz, MD;  Location: [facility];  Service: General;  Laterality: Right;  soft tissue mass  . UPPER GASTROINTESTINAL ENDOSCOPY  AUG 2008 PAIN/NAUSEA   H. PYLORI treated    Prior to Admission medications   Medication Sig Start Date End Date Taking? Authorizing Provider  ALPRAZolam Duanne Moron) 1 MG tablet Take 1 mg by mouth at bedtime as needed for anxiety.   Yes [provider]  enalapril (VASOTEC) 20 MG tablet TAKE 1 TABLET (20 MG TOTAL) BY MOUTH AT BEDTIME. 05/21/17  Yes [name], MD  furosemide (LASIX) 20 MG tablet Take 1 tablet (20 mg total) by mouth daily. 05/21/17  Yes [name], MD  glucose blood test strip OneTouch Ultra Blue Test Strip  USE TO TEST ONCE DAILY   Yes [provider]  levothyroxine (SYNTHROID, LEVOTHROID) 50 MCG tablet TAKE 1 TABLET (50 MCG TOTAL) BY MOUTH DAILY. 05/21/17  Yes [name], MD  lovastatin (MEVACOR) 40 MG tablet TAKE 1 TABLET BY MOUTH EVERYDAY AT BEDTIME 05/21/17  Yes [name], MD  mometasone (ELOCON) 0.1 % cream Apply to affected area twice  daily as needed for itching 01/14/17  Yes [name], MD  OVER THE COUNTER MEDICATION Vitamin D 2,000 u  As needed   Yes [provider]  pantoprazole (PROTONIX) 40 MG tablet Take 1 tablet (40 mg total) by mouth 2 (two) times daily before a meal. 05/27/17  Yes Carlis Stable, NP  triamcinolone cream (KENALOG) 0.1 % Apply 1 application 2 (two) times daily topically. Patient taking differently: Apply 1 application topically as needed.  01/08/17  Yes [name], MD  Verapamil HCl CR 300 MG CP24 Take 1  capsule (300 mg total) by mouth at bedtime. 05/21/17  Yes [name], MD  ALPRAZolam Duanne Moron) 1 MG tablet TAKE 1 TABLET BY MOUTH 3 TIMES A DAY Patient not taking: Reported on 07/16/2017 06/10/17   [name], MD  blood glucose meter kit and supplies Dispense based on patient and insurance preference. Test once a day E11.9 Patient not taking: Reported on 07/16/2017 07/04/15   [name], MD  Cholecalciferol (VITAMIN D3) 1000 units CAPS Take 2,000 Units by mouth daily. Doesn't take everyday    [provider]  ciprofloxacin (CIPRO) 250 MG tablet One po BID for five days Patient not taking: Reported on 07/16/2017 05/27/17   [name], MD  escitalopram (LEXAPRO) 5 MG tablet Take 5 mg by mouth daily.    [provider]  metroNIDAZOLE (METROGEL VAGINAL) 0.75 % vaginal gel Nightly x 5 nights Patient not taking: Reported on 07/16/2017 06/04/17   Florian Buff, MD  ondansetron (ZOFRAN ODT) 4 MG disintegrating tablet Take 1 tablet (4 mg total) by mouth every 8 (eight) hours as needed for nausea or vomiting. Patient not taking: Reported on 07/16/2017 05/21/17   [name], MD  sucralfate (CARAFATE) 1 g tablet Take 1 tablet (1 g total) by mouth 4 (four) times daily -  with meals and at bedtime. Patient not taking: Reported on 07/16/2017 05/27/17   Carlis Stable, NP    Allergies as of 07/16/2017 - Review Complete 07/16/2017  Allergen Reaction Noted  . Asa [aspirin] Other (See Comments) 06/09/2012  . Augmentin [amoxicillin-pot clavulanate] Diarrhea 06/09/2012  . Levaquin [levofloxacin in d5w] Other (See Comments) 07/14/2012  . Rocephin [ceftriaxone sodium in dextrose] Hives and Other (See Comments) 06/09/2012  . Valtrex [valacyclovir hcl] Hives and Other (See Comments) 06/09/2012  . Zoloft [sertraline hcl] Other (See Comments) 06/09/2012    Family History  Problem Relation Age of Onset  . Coronary artery disease Mother   . Hypertension Mother   . Heart attack  Mother   . Heart disease Mother   . Suicidality Mother   . Coronary artery disease Brother        Age 65  . Heart attack Brother   . Heart disease Brother   . Diabetes Sister   . Diabetes Sister   . Colon cancer Neg Hx   . Anesthesia problems Neg Hx   . Hypotension Neg Hx   . Malignant hyperthermia Neg Hx   . Pseudochol deficiency Neg Hx     Social History   Socioeconomic History  . Marital status: Married    Spouse name: Not on file  . Number of children: Not on file  . Years of education: Not on file  . Highest education level: Not on file  Occupational History  . Not on file  Social Needs  . Financial resource strain: Not on file  . Food insecurity:    Worry: Not  on file    Inability: Not on file  . Transportation needs:    Medical: Not on file    Non-medical: Not on file  Tobacco Use  . Smoking status: Never Smoker  . Smokeless tobacco: Never Used  . Tobacco comment: Never Smoked  Substance and Sexual Activity  . Alcohol use: No  . Drug use: No  . Sexual activity: Never  Lifestyle  . Physical activity:    Days per week: Not on file    Minutes per session: Not on file  . Stress: Not on file  Relationships  . Social connections:    Talks on phone: Not on file    Gets together: Not on file    Attends religious service: Not on file    Active member of club or organization: Not on file    Attends meetings of clubs or organizations: Not on file    Relationship status: Not on file  . Intimate partner violence:    Fear of current or ex partner: Not on file    Emotionally abused: Not on file    Physically abused: Not on file    Forced sexual activity: Not on file  Other Topics Concern  . Not on file  Social History Narrative  . Not on file    Review of Systems: See HPI, otherwise negative ROS  Physical Exam: BP 122/78   Pulse 66   Temp (!) 97.1 F (36.2 C) (Oral)   Ht _0  (1.651 m)   Wt 188 lb 3.2 oz (85.4 kg)   BMI 31.32 kg/m  General:    Alert, pleasant and cooperative in NAD Lungs:  Clear throughout to auscultation.   No wheezes, crackles, or rhonchi. No acute distress. Heart:  Regular rate and rhythm; no murmurs, clicks, rubs,  or gallops. Abdomen: Non-distended, normal bowel sounds.  Soft and nontender without appreciable mass or hepatosplenomegaly.  Pulses:  Normal pulses noted. Extremities:  Without clubbing or edema.  Impression: GERD symptoms well controlled currently on Protonix 40 mg twice a day. Intermittent bouts of epigastric pain likely secondary to functional dyspepsia more than anything else. Irritable bowel syndrome-C;  Relatively quiescent lately. Recent weight gain  reassuring.  History of advanced colonic adenoma; due for surveillance colonoscopy 2020.   Recommendations:  Will continue Protonix 40 mg twice daily for the time being.  We'll also allow her take Carafate suspension 1 g before meals on a when necessary basis whenever she has a flare of dyspeptic symptoms  Colonoscopy 2020  Office visit here in 3 months   Notice: This dictation was prepared with Dragon dictation along with smaller phrase technology. Any transcriptional errors that result from this process are unintentional and may not be corrected upon review.

## 2017-07-25 ENCOUNTER — Telehealth: Payer: Self-pay | Admitting: Internal Medicine

## 2017-07-25 DIAGNOSIS — K2100 Gastro-esophageal reflux disease with esophagitis, without bleeding: Secondary | ICD-10-CM

## 2017-07-25 NOTE — Telephone Encounter (Signed)
Spoke with pt,  She is going the suspension form of Carafate and not the pills. The suspension is higher than the tablets. On GoodRx 120 tablets would be $30-35. Pt doesn't want to take pills.   Tried calling [facility] with no answer,  to see if the medication was covered at the price of $70 or if it needs a PA. Will call [facility] back.

## 2017-07-25 NOTE — Telephone Encounter (Signed)
Pt said that her carafate prescription is $70 and she can't afford that. She was asking if we had coupons or could we do a PA for her to get it cheaper or was there something else she could take that wasn't as expensive. Please advise and call [phone]

## 2017-07-30 NOTE — Telephone Encounter (Signed)
EG please see below note. Pt doesn't want to take carafate pills and the liquid id too expensive for pt. Pt can get the pills for less than $70. Is there anything else pt can take?

## 2017-07-30 NOTE — Telephone Encounter (Signed)
EG, pt said she will take the pills now and would like an RX sent to [facility].

## 2017-07-30 NOTE — Telephone Encounter (Signed)
Last seen by RMR. If she is declining to take Carafate pills (can crush and mix with some water into a slurry) then I'd say lets try OTC Zantac before meals as needed for flare of her GERD/dyspepsia symptoms.

## 2017-07-31 MED ORDER — SUCRALFATE 1 G PO TABS: 1.0000 g | ORAL_TABLET | Freq: Four times a day (QID) | ORAL | 2 refills | Status: DC | PRN

## 2017-07-31 NOTE — Addendum Note (Signed)
Addended by: Gordy Levan, Kaylianna Detert A on: 07/31/2017 04:27 PM   Modules accepted: Orders

## 2017-07-31 NOTE — Telephone Encounter (Signed)
Med sent to pharmacy. Please notify patient.

## 2017-08-01 NOTE — Telephone Encounter (Signed)
Pt.notified

## 2017-08-19 ENCOUNTER — Other Ambulatory Visit: Payer: Self-pay | Admitting: Family Medicine

## 2017-08-20 NOTE — Telephone Encounter (Signed)
May ref monthly six mo worth

## 2017-09-04 ENCOUNTER — Ambulatory Visit: Payer: BLUE CROSS/BLUE SHIELD | Admitting: Family Medicine

## 2017-09-04 ENCOUNTER — Encounter: Payer: Self-pay | Admitting: Family Medicine

## 2017-09-04 VITALS — BP 150/78 | Temp 97.8°F | Ht 65.0 in | Wt 196.0 lb

## 2017-09-04 DIAGNOSIS — R300 Dysuria: Secondary | ICD-10-CM | POA: Diagnosis not present

## 2017-09-04 DIAGNOSIS — R079 Chest pain, unspecified: Secondary | ICD-10-CM

## 2017-09-04 DIAGNOSIS — M545 Low back pain, unspecified: Secondary | ICD-10-CM

## 2017-09-04 LAB — POCT URINALYSIS DIPSTICK
Blood, UA: NEGATIVE
LEUKOCYTES UA: NEGATIVE
Nitrite, UA: NEGATIVE
Spec Grav, UA: 1.025 (ref 1.010–1.025)
pH, UA: 5 (ref 5.0–8.0)

## 2017-09-04 MED ORDER — ETODOLAC 400 MG PO TABS: ORAL_TABLET | ORAL | 0 refills | Status: DC

## 2017-09-04 NOTE — Progress Notes (Signed)
   Subjective:    Patient ID: Kaitlin [name], female    DOB: 12-20-1955, 62 y.o.   MRN: [id number]  HPI  Patient is here today with complaints of chest tightness and feeling of being hot then cold. She has been having some frequent urination. The chest pain started on Monday.She is having some lower back pain,but has been lifting the thrash can and push mow the yard last week.  Got hot and sweaty and   Low back hurt and achey   Energy level not good  Pt on eight hr shifts  Moved a garbage can five d ago, very heavy, then mowed  Three hrs,  husb normally mowes the lawn    BP usually good    Tight in the chest , painful with movement   sems a little so b with        Review of Systems Results for orders placed or performed in visit on 09/04/17  POCT urinalysis dipstick  Result Value Ref Range   Color, UA     Clarity, UA     Glucose, UA  Negative   Bilirubin, UA     Ketones, UA     Spec Grav, UA 1.025 1.010 - 1.025   Blood, UA Negative    pH, UA 5.0 5.0 - 8.0   Protein, UA  Negative   Urobilinogen, UA  0.2 or 1.0 E.U./dL   Nitrite, UA Negative    Leukocytes, UA Negative Negative   Appearance     Odor     No headache, no major weight loss or weight gain, no chest pain no back pain abdominal pain no change in bowel habits complete ROS otherwise negative     Objective:   Physical Exam  Alert and oriented, vitals reviewed and stable, NAD ENT-TM's and ext canals WNL bilat via otoscopic exam Soft palate, tonsils and post pharynx WNL via oropharyngeal exam Neck-symmetric, no masses; thyroid nonpalpable and nontender Pulmonary-no tachypnea or accessory muscle use; Clear without wheezes via auscultation Card--no abnrml murmurs, rhythm reg and rate WNL Carotid pulses symmetric, without bruits   Abdominal exam benign anterior chest wall positive tenderness over ribs commences sternum  EKG normal sinus rhythm.  No significant ST-T changes.  Urinalysis  negative      Assessment & Plan:  1 impression chest pain.  Discussed at length.  Feel highly likely noncardiac.  Rationale discussed.  Positive anterior chest pain.  Patient did mow the lawn for nearly 3 hours when she experienced this  2.  Low back pain.  No evidence of urinary tract infection discussed.  Likely musculoskeletal secondary to exertion.  No need for cardiology work-up rationale discussed.  Anti-inflammatory medicine prescribed.  Symptom care discussed.  WSR  Greater than 50% of this 25 minute face to face visit was spent in counseling and discussion and coordination of care regarding the above diagnosis/diagnosies

## 2017-09-10 ENCOUNTER — Encounter: Payer: Self-pay | Admitting: Family Medicine

## 2017-09-21 ENCOUNTER — Other Ambulatory Visit: Payer: Self-pay | Admitting: Family Medicine

## 2017-10-22 ENCOUNTER — Other Ambulatory Visit: Payer: Self-pay | Admitting: Family Medicine

## 2017-10-29 ENCOUNTER — Ambulatory Visit: Payer: BLUE CROSS/BLUE SHIELD | Admitting: Internal Medicine

## 2017-10-29 ENCOUNTER — Encounter: Payer: Self-pay | Admitting: Internal Medicine

## 2017-10-29 VITALS — BP 158/88 | HR 84 | Temp 97.9°F | Ht 65.0 in | Wt 201.8 lb

## 2017-10-29 DIAGNOSIS — R1032 Left lower quadrant pain: Secondary | ICD-10-CM | POA: Diagnosis not present

## 2017-10-29 DIAGNOSIS — K219 Gastro-esophageal reflux disease without esophagitis: Secondary | ICD-10-CM

## 2017-10-29 NOTE — Patient Instructions (Addendum)
Continue protonix 40 mg twice daily  Office visit in 12 weeks  See gynecologist and Dr. Wolfgang Phoenix as discussed  If abdominal pain worsens in the interim, let me know  Plan for colonoscopy in 2020.  Weight loss will help your reflux

## 2017-10-29 NOTE — Progress Notes (Signed)
Primary Care Physician:  [name], MD Primary Gastroenterologist:  Dr. Gala Romney  Pre-Procedure History & Physical: HPI:  Kaitlin [name] is a 62 y.o. female here for Follow-up of GERD and colonic polyps. Reflux symptoms well controlled on Protonix 40 mg twice daily. She requires twice-daily therapy for control of symptoms. She lost quite a bit of weight -  but states she's gained it secondary to situational anxiety over the past several months. I do have her up 14 pounds.  No melena, hematochezia or constipation. States bowels are now functioning normally.  Her big issue these days is situational stress at home.  Hx of of multiple colonic adenomas removed previously; due for surveillance colonoscopy 2020.  Has had intermittent left-sided abdominal pain for several months. States it's worse with stress at home. There is no associated bowel dysfunction.  No fever. No nausea or vomiting.  She is planning to see her gynecologist and PCP in the near future.  Past Medical History:  Diagnosis Date  . Anxiety   . BMI 30.0-30.9,adult 2008 182 LBS   2009 194 LBS  . Colon polyp APR 2008  . Depression   . Essential hypertension, benign   . Folliculitis 2/94/7654  . Helicobacter pylori gastritis AUG 2008 PREVPAC: nausea-->HELIDAC: GI UPSET   HB 14.1 NL HFP H. PYLORI STOOL Ag NEG  . Hypothyroidism   . Irritable bowel syndrome   . Kidney stones   . Mixed hyperlipidemia   . Mixed stress and urge urinary incontinence 09/26/2012  . Panic attacks   . Polyp of colon, hyperplastic   . Reflux   . Tubulovillous adenoma 08/07/10   Colonoscopy w/ Dr Oneida Alar w/ 7 polyps removed-bx showed tubular adenomas.  Largest 1cm-hepatic flexure polyp->TA.      Past Surgical History:  Procedure Laterality Date  . CHOLECYSTECTOMY    . COLONOSCOPY  APR 2008   AC/[place] SIMPLE ADENOMA  . COLONOSCOPY  08/07/2010   Dr. Raynald Kemp adenomas and tubulovillous adenoma; needs surveillance 2015  . COLONOSCOPY N/A  09/18/2013   Dr.Tyberius Ryner- normal rectum, 2 diminutive polys in the mid sigmoid segment o/w the remainder of the colonic mucosa appeared normal.hyperplastic poylps  . ESOPHAGOGASTRODUODENOSCOPY N/A 11/29/2014   RMR: Gastric erosions. Small hiatal hernia. Status post biopsy.   Marland Kitchen MASS EXCISION  02/28/2011   Procedure: EXCISION MASS;  Surgeon: Donato Heinz, MD;  Location: [facility];  Service: General;  Laterality: Right;  soft tissue mass  . UPPER GASTROINTESTINAL ENDOSCOPY  AUG 2008 PAIN/NAUSEA   H. PYLORI treated    Prior to Admission medications   Medication Sig Start Date End Date Taking? Authorizing Provider  ALPRAZolam Duanne Moron) 1 MG tablet TAKE 1 TABLET BY MOUTH 3 TIMES A DAY 08/20/17  Yes [name], MD  enalapril (VASOTEC) 20 MG tablet TAKE 1 TABLET (20 MG TOTAL) BY MOUTH AT BEDTIME. 05/21/17  Yes [name], MD  etodolac (LODINE) 400 MG tablet One p o bid with food prn pain 09/04/17  Yes [name], MD  furosemide (LASIX) 20 MG tablet Take 1 tablet (20 mg total) by mouth daily. 05/21/17  Yes [name], MD  levothyroxine (SYNTHROID, LEVOTHROID) 50 MCG tablet TAKE 1 TABLET BY MOUTH EVERY DAY 10/22/17  Yes [name], MD  lovastatin (MEVACOR) 40 MG tablet TAKE 1 TABLET BY MOUTH EVERYDAY AT BEDTIME 05/21/17  Yes [name], MD  mometasone (ELOCON) 0.1 % cream Apply to affected area twice daily as needed for itching 01/14/17  Yes [name], MD  ondansetron (ZOFRAN ODT) 4 MG disintegrating tablet Take 1 tablet (4 mg total) by mouth every 8 (eight) hours as needed for nausea or vomiting. 05/21/17  Yes [name], MD  pantoprazole (PROTONIX) 40 MG tablet Take 1 tablet (40 mg total) by mouth 2 (two) times daily before a meal. 05/27/17  Yes Carlis Stable, NP  triamcinolone cream (KENALOG) 0.1 % Apply 1 application 2 (two) times daily topically. 01/08/17  Yes [name], MD  Verapamil HCl CR 300 MG CP24 TAKE 1 CAPSULE BY MOUTH EVERYDAY AT BEDTIME  09/23/17  Yes [name], MD  ALPRAZolam Duanne Moron) 1 MG tablet Take 1 mg by mouth at bedtime as needed for anxiety.    [provider]  blood glucose meter kit and supplies Dispense based on patient and insurance preference. Test once a day E11.9 07/04/15   [name], MD  Cholecalciferol (VITAMIN D3) 1000 units CAPS Take 2,000 Units by mouth daily. Doesn't take everyday    [provider]  ciprofloxacin (CIPRO) 250 MG tablet One po BID for five days Patient not taking: Reported on 09/04/2017 05/27/17   [name], MD  escitalopram (LEXAPRO) 5 MG tablet Take 5 mg by mouth daily.    [provider]  glucose blood test strip OneTouch Ultra Blue Test Strip  USE TO TEST ONCE DAILY    [provider]  metroNIDAZOLE (METROGEL VAGINAL) 0.75 % vaginal gel Nightly x 5 nights Patient not taking: Reported on 09/04/2017 06/04/17   Florian Buff, MD  OVER THE COUNTER MEDICATION Vitamin D 2,000 u  As needed    [provider]  sucralfate (CARAFATE) 1 g tablet Take 1 tablet (1 g total) by mouth 4 (four) times daily as needed (breakthrough heartburn symptoms). Crush and mix with water into a slurry Patient not taking: Reported on 09/04/2017 07/31/17   Carlis Stable, NP    Allergies as of 10/29/2017 - Review Complete 10/29/2017  Allergen Reaction Noted  . Asa [aspirin] Other (See Comments) 06/09/2012  . Augmentin [amoxicillin-pot clavulanate] Diarrhea 06/09/2012  . Levaquin [levofloxacin in d5w] Other (See Comments) 07/14/2012  . Rocephin [ceftriaxone sodium in dextrose] Hives and Other (See Comments) 06/09/2012  . Valtrex [valacyclovir hcl] Hives and Other (See Comments) 06/09/2012  . Zoloft [sertraline hcl] Other (See Comments) 06/09/2012    Family History  Problem Relation Age of Onset  . Coronary artery disease Mother   . Hypertension Mother   . Heart attack Mother   . Heart disease Mother   . Suicidality Mother   . Coronary artery disease  Brother        Age 27  . Heart attack Brother   . Heart disease Brother   . Diabetes Sister   . Diabetes Sister   . Colon cancer Neg Hx   . Anesthesia problems Neg Hx   . Hypotension Neg Hx   . Malignant hyperthermia Neg Hx   . Pseudochol deficiency Neg Hx     Social History   Socioeconomic History  . Marital status: Married    Spouse name: Not on file  . Number of children: Not on file  . Years of education: Not on file  . Highest education level: Not on file  Occupational History  . Not on file  Social Needs  . Financial resource strain: Not on file  . Food insecurity:    Worry: Not on file    Inability: Not on  file  . Transportation needs:    Medical: Not on file    Non-medical: Not on file  Tobacco Use  . Smoking status: Never Smoker  . Smokeless tobacco: Never Used  . Tobacco comment: Never Smoked  Substance and Sexual Activity  . Alcohol use: No  . Drug use: No  . Sexual activity: Never  Lifestyle  . Physical activity:    Days per week: Not on file    Minutes per session: Not on file  . Stress: Not on file  Relationships  . Social connections:    Talks on phone: Not on file    Gets together: Not on file    Attends religious service: Not on file    Active member of club or organization: Not on file    Attends meetings of clubs or organizations: Not on file    Relationship status: Not on file  . Intimate partner violence:    Fear of current or ex partner: Not on file    Emotionally abused: Not on file    Physically abused: Not on file    Forced sexual activity: Not on file  Other Topics Concern  . Not on file  Social History Narrative  . Not on file    Review of Systems: See HPI, otherwise negative ROS  Physical Exam: BP (!) 158/88   Pulse 84   Temp 97.9 F (36.6 C) (Oral)   Ht _0  (1.651 m)   Wt 201 lb 12.8 oz (91.5 kg)   BMI 33.58 kg/m  General:   Alert,  Well-developed, well-nourished, pleasant and cooperative in NAD Neck:  Supple;  no masses or thyromegaly. No significant cervical adenopathy. Lungs:  Clear throughout to auscultation.   No wheezes, crackles, or rhonchi. No acute distress. Heart:  Regular rate and rhythm; no murmurs, clicks, rubs,  or gallops. Abdomen: Non-distended, normal bowel sounds.  Soft and nontender without appreciable mass or hepatosplenomegaly.  Pulses:  Normal pulses noted. Extremities:  Without clubbing or edema.  Impression/Plan:  62 year old lady with a history of  GERD well controlled on Protonix Daily. Intermittent left lower quadrant abdominal pain -of uncertain significance..  Patient perceives that occurs with  Increasing stress at home. Benign abdominal examination today. She is slated to see her gynecologist and Dr. Wolfgang Phoenix in in the near future.  She certainly does not toxic or acutely ill at this time.    Recommendations: Continue protonix 40 mg twice daily  Office visit in 12 weeks  See gynecologist and Dr. Wolfgang Phoenix as discussed  If abdominal pain worsens in the interim, let me know. Further evaluation may be needed.  Plan for colonoscopy in 2020.  Weight loss will help your reflux      Notice: This dictation was prepared with Dragon dictation along with smaller phrase technology. Any transcriptional errors that result from this process are unintentional and may not be corrected upon review.

## 2017-11-11 ENCOUNTER — Telehealth: Payer: Self-pay

## 2017-11-11 ENCOUNTER — Ambulatory Visit: Payer: BLUE CROSS/BLUE SHIELD | Admitting: Family Medicine

## 2017-11-11 ENCOUNTER — Other Ambulatory Visit: Payer: Self-pay

## 2017-11-11 DIAGNOSIS — K2100 Gastro-esophageal reflux disease with esophagitis, without bleeding: Secondary | ICD-10-CM

## 2017-11-11 NOTE — Telephone Encounter (Signed)
Patient walked into the clinic today stating she did not know her appt with Dr.Steve was cancelled and moved to 11/13/2017. She wanted to mention to [name] she was having some right side facial numbness and jaw pain that started a week ago. She stated she also was having some confusion that started around the same time.She reports that she fell asleep at work and woke up confused as to where she was at and she had another occurrence of this home when she woke she did not know what day it was. She also reports some left side neck pain. I discussed this with Dr.Scott Luking whom recommended that we see her today with in the next hour.She states she did not want to be seen today she will hold off and wait to see Dr.Steve Luking on this coming Wednesday 11/13/2017, She states if worsens before then she will go to the ED.

## 2017-11-11 NOTE — Telephone Encounter (Signed)
Error on medication refill for sucralfate.

## 2017-11-11 NOTE — Telephone Encounter (Signed)
I agree with the management

## 2017-11-13 ENCOUNTER — Encounter: Payer: Self-pay | Admitting: Family Medicine

## 2017-11-13 ENCOUNTER — Ambulatory Visit (INDEPENDENT_AMBULATORY_CARE_PROVIDER_SITE_OTHER): Payer: BLUE CROSS/BLUE SHIELD | Admitting: Family Medicine

## 2017-11-13 VITALS — BP 124/84 | Ht 65.0 in | Wt 200.4 lb

## 2017-11-13 DIAGNOSIS — F411 Generalized anxiety disorder: Secondary | ICD-10-CM | POA: Diagnosis not present

## 2017-11-13 DIAGNOSIS — E039 Hypothyroidism, unspecified: Secondary | ICD-10-CM

## 2017-11-13 DIAGNOSIS — I10 Essential (primary) hypertension: Secondary | ICD-10-CM | POA: Diagnosis not present

## 2017-11-13 DIAGNOSIS — E349 Endocrine disorder, unspecified: Secondary | ICD-10-CM

## 2017-11-13 DIAGNOSIS — E7800 Pure hypercholesterolemia, unspecified: Secondary | ICD-10-CM

## 2017-11-13 DIAGNOSIS — R7303 Prediabetes: Secondary | ICD-10-CM | POA: Diagnosis not present

## 2017-11-13 DIAGNOSIS — Z23 Encounter for immunization: Secondary | ICD-10-CM | POA: Diagnosis not present

## 2017-11-13 MED ORDER — LEVOTHYROXINE SODIUM 50 MCG PO TABS: ORAL_TABLET | ORAL | 5 refills | Status: DC

## 2017-11-13 MED ORDER — ESCITALOPRAM OXALATE 10 MG PO TABS: 10.0000 mg | ORAL_TABLET | Freq: Every day | ORAL | 5 refills | Status: DC

## 2017-11-13 MED ORDER — VERAPAMIL HCL ER 300 MG PO CP24: ORAL_CAPSULE | ORAL | 5 refills | Status: DC

## 2017-11-13 MED ORDER — LOVASTATIN 40 MG PO TABS: ORAL_TABLET | ORAL | 1 refills | Status: DC

## 2017-11-13 MED ORDER — ALPRAZOLAM 1 MG PO TABS: 1.0000 mg | ORAL_TABLET | Freq: Three times a day (TID) | ORAL | 5 refills | Status: DC

## 2017-11-13 MED ORDER — FUROSEMIDE 20 MG PO TABS: 20.0000 mg | ORAL_TABLET | Freq: Every day | ORAL | 5 refills | Status: DC

## 2017-11-13 MED ORDER — ENALAPRIL MALEATE 20 MG PO TABS: ORAL_TABLET | ORAL | 5 refills | Status: DC
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
## 2017-11-14 LAB — HEPATIC FUNCTION PANEL
ALBUMIN: 5.1 g/dL — AB (ref 3.6–4.8)
ALT: 22 IU/L (ref 0–32)
AST: 21 IU/L (ref 0–40)
Alkaline Phosphatase: 66 IU/L (ref 39–117)
Bilirubin Total: 0.4 mg/dL (ref 0.0–1.2)
Bilirubin, Direct: 0.12 mg/dL (ref 0.00–0.40)
TOTAL PROTEIN: 7.8 g/dL (ref 6.0–8.5)

## 2017-11-14 LAB — LIPID PANEL
CHOL/HDL RATIO: 2.8 ratio (ref 0.0–4.4)
Cholesterol, Total: 207 mg/dL — ABNORMAL HIGH (ref 100–199)
HDL: 73 mg/dL (ref >39)
LDL CALC: 118 mg/dL — AB (ref 0–99)
Triglycerides: 79 mg/dL (ref 0–149)
VLDL CHOLESTEROL CAL: 16 mg/dL (ref 5–40)

## 2017-11-14 LAB — BASIC METABOLIC PANEL
BUN/Creatinine Ratio: 24 (ref 12–28)
BUN: 20 mg/dL (ref 8–27)
CALCIUM: 10.9 mg/dL — AB (ref 8.7–10.3)
CO2: 24 mmol/L (ref 20–29)
CREATININE: 0.85 mg/dL (ref 0.57–1.00)
Chloride: 101 mmol/L (ref 96–106)
GFR calc Af Amer: 86 mL/min/{1.73_m2} (ref >59)
GFR, EST NON AFRICAN AMERICAN: 75 mL/min/{1.73_m2} (ref >59)
GLUCOSE: 83 mg/dL (ref 65–99)
Potassium: 4.4 mmol/L (ref 3.5–5.2)
Sodium: 139 mmol/L (ref 134–144)

## 2017-11-14 LAB — PTH, INTACT AND CALCIUM: PTH: 45 pg/mL (ref 15–65)

## 2017-11-14 LAB — VITAMIN B12: Vitamin B-12: 532 pg/mL (ref 232–1245)

## 2017-11-17 ENCOUNTER — Encounter: Payer: Self-pay | Admitting: Family Medicine

## 2017-11-22 ENCOUNTER — Encounter (HOSPITAL_COMMUNITY): Payer: Self-pay | Admitting: Licensed Clinical Social Worker

## 2017-11-22 ENCOUNTER — Ambulatory Visit (INDEPENDENT_AMBULATORY_CARE_PROVIDER_SITE_OTHER): Payer: BLUE CROSS/BLUE SHIELD | Admitting: Licensed Clinical Social Worker

## 2017-11-22 DIAGNOSIS — F419 Anxiety disorder, unspecified: Secondary | ICD-10-CM

## 2017-11-22 DIAGNOSIS — F331 Major depressive disorder, recurrent, moderate: Secondary | ICD-10-CM | POA: Diagnosis not present
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
## 2017-11-25 ENCOUNTER — Encounter

## 2017-11-25 ENCOUNTER — Ambulatory Visit (HOSPITAL_COMMUNITY): Payer: Self-pay | Admitting: Licensed Clinical Social Worker

## 2017-11-29 ENCOUNTER — Other Ambulatory Visit: Payer: BLUE CROSS/BLUE SHIELD | Admitting: Obstetrics & Gynecology

## 2017-12-10 ENCOUNTER — Telehealth: Payer: Self-pay | Admitting: Family Medicine

## 2017-12-10 NOTE — Telephone Encounter (Signed)
Left message to return call 

## 2017-12-10 NOTE — Telephone Encounter (Signed)
Please advise. Thank you

## 2017-12-10 NOTE — Telephone Encounter (Signed)
Due to outbreak of mumps at [facility] patient is wondering if she should retake the mumps shot and if so the health center at [facility] would give it to her. Please advise

## 2017-12-10 NOTE — Telephone Encounter (Signed)
Only if their medical dept is recommending it for employees

## 2017-12-16 ENCOUNTER — Encounter: Payer: Self-pay | Admitting: Internal Medicine

## 2017-12-18 ENCOUNTER — Ambulatory Visit (HOSPITAL_COMMUNITY): Payer: BLUE CROSS/BLUE SHIELD | Admitting: Licensed Clinical Social Worker

## 2017-12-22 ENCOUNTER — Other Ambulatory Visit: Payer: Self-pay | Admitting: Nurse Practitioner

## 2017-12-22 DIAGNOSIS — K589 Irritable bowel syndrome without diarrhea: Secondary | ICD-10-CM

## 2017-12-22 DIAGNOSIS — K219 Gastro-esophageal reflux disease without esophagitis: Secondary | ICD-10-CM

## 2017-12-22 DIAGNOSIS — R634 Abnormal weight loss: Secondary | ICD-10-CM

## 2017-12-23 ENCOUNTER — Telehealth: Payer: Self-pay | Admitting: Family Medicine

## 2017-12-23 NOTE — Telephone Encounter (Signed)
Pt currently taking  Verapamil HCl CR 300 MG CP24 for BP, her general co-pay for generic has been $10 co-pay now is $70 pt states she cannot pay that amount.  Pt is currently out of BP medication advise.    [id number] - [place], [place] - [address]

## 2017-12-23 NOTE — Telephone Encounter (Signed)
Last seen 11/13/17

## 2017-12-24 ENCOUNTER — Other Ambulatory Visit: Payer: Self-pay

## 2017-12-24 MED ORDER — VERAPAMIL HCL ER 240 MG PO TBCR: EXTENDED_RELEASE_TABLET | ORAL | 5 refills | Status: DC

## 2017-12-24 NOTE — Telephone Encounter (Signed)
Can switch to 240 Sr almost as strong, should still be less expensive six mo worth

## 2017-12-24 NOTE — Telephone Encounter (Signed)
Pt returned call. Informed patient that we have sent in Verapamil 240 mg. Pt states she is still going to ask why the price went up. Pt is very upset about price. Informed patient that I am not sure why it went up but hopefully this medication strength will be cheaper. Informed patient that if she has any problems to give [name] a call back. Pt verbalized understanding.

## 2017-12-24 NOTE — Telephone Encounter (Signed)
Left message with husband to return call. Medication sent in to pharmacy. Cancelled the Verapamil 300 mg.

## 2018-01-08 ENCOUNTER — Ambulatory Visit (HOSPITAL_COMMUNITY): Payer: Self-pay | Admitting: Licensed Clinical Social Worker

## 2018-01-13 ENCOUNTER — Encounter: Payer: Self-pay | Admitting: Cardiology

## 2018-01-13 NOTE — Progress Notes (Signed)
Cardiology Office Note  Date: 01/14/2018   ID: [name], [name] 19-Apr-1955, MRN [id number]  PCP: [name], MD  Evaluating Cardiologist: Rozann Lesches, MD   Chief Complaint  Patient presents with  . Hypertension    History of Present Illness: Kaitlin [name] is a 62 y.o. female followed by Dr. Harl Bowie over the last few years and last seen in November 2018.  I saw her remotely in 2012.  She presents for a routine visit, requested a switch in provider.  She states that overall she feels well.  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  She does not report any recurring chest pain with exertion, does have a history of chest pain in the past with previous reassuring ischemic work-up.  I reviewed her ECG from July.  She states that she has been compliant with her medications, current cardiac regimen includes verapamil, enapapril, and she is also on low-dose Lasix, takes half of a 20 mg tablet daily.  Recent lab work is outlined below.  LDL 118 and HDL 73.  She is on lovastatin.  We discussed diet and also a walking regimen for exercise.  She does have trouble with knee pain.  Also uses compression hose when she is working.  Past Medical History:  Diagnosis Date  . Anxiety   . Colon polyp APR 2008  . Depression   . Essential hypertension   . Folliculitis 07/11/3265  . Helicobacter pylori gastritis    AUG 2008 PREVPAC: nausea-->HELIDAC: GI UPSET  . Hypothyroidism   . Irritable bowel syndrome   . Kidney stones   . Mixed hyperlipidemia   . Mixed stress and urge urinary incontinence 09/26/2012  . Obesity   . Panic attacks   . Reflux   . Tubulovillous adenoma 08/07/10   Colonoscopy w/ Dr Oneida Alar w/ 7 polyps removed-bx showed tubular adenomas.  Largest 1cm-hepatic flexure polyp->TA.      Past Surgical History:  Procedure Laterality Date  . CHOLECYSTECTOMY    . COLONOSCOPY  APR 2008   [facility]/[place] SIMPLE ADENOMA  . COLONOSCOPY  08/07/2010   Dr.  Raynald Kemp adenomas and tubulovillous adenoma; needs surveillance 2015  . COLONOSCOPY N/A 09/18/2013   Dr.Rourk- normal rectum, 2 diminutive polys in the mid sigmoid segment o/w the remainder of the colonic mucosa appeared normal.hyperplastic poylps  . ESOPHAGOGASTRODUODENOSCOPY N/A 11/29/2014   RMR: Gastric erosions. Small hiatal hernia. Status post biopsy.   Marland Kitchen MASS EXCISION  02/28/2011   Procedure: EXCISION MASS;  Surgeon: Donato Heinz, MD;  Location: [facility];  Service: General;  Laterality: Right;  soft tissue mass  . UPPER GASTROINTESTINAL ENDOSCOPY  AUG 2008 PAIN/NAUSEA   H. PYLORI treated    Current Outpatient Medications  Medication Sig Dispense Refill  . ALPRAZolam (XANAX) 1 MG tablet Take 1 tablet (1 mg total) by mouth 3 (three) times daily. 90 tablet 5  . blood glucose meter kit and supplies Dispense based on patient and insurance preference. Test once a day E11.9 1 each 0  . enalapril (VASOTEC) 20 MG tablet TAKE 1 TABLET (20 MG TOTAL) BY MOUTH AT BEDTIME. 30 tablet 5  . furosemide (LASIX) 20 MG tablet Take 1 tablet (20 mg total) by mouth daily. 30 tablet 5  . glucose blood test strip OneTouch Ultra Blue Test Strip  USE TO TEST ONCE DAILY    . levothyroxine (SYNTHROID, LEVOTHROID) 50 MCG tablet TAKE 1 TABLET BY MOUTH EVERY DAY 30 tablet 5  . lovastatin (  MEVACOR) 40 MG tablet TAKE 1 TABLET BY MOUTH EVERYDAY AT BEDTIME 90 tablet 1  . mometasone (ELOCON) 0.1 % cream Apply to affected area twice daily as needed for itching 60 g 0  . OVER THE COUNTER MEDICATION Vitamin D 2,000 u  As needed    . pantoprazole (PROTONIX) 40 MG tablet TAKE 1 TABLET (40 MG TOTAL) BY MOUTH 2 (TWO) TIMES DAILY BEFORE A MEAL 60 tablet 3  . triamcinolone cream (KENALOG) 0.1 % Apply 1 application 2 (two) times daily topically. 30 g 0  . verapamil (CALAN-SR) 240 MG CR tablet Take one tablet by mouth at bedtime 30 tablet 5   No current facility-administered medications for this visit.    Allergies:  Asa  [aspirin]; Augmentin [amoxicillin-pot clavulanate]; Levaquin [levofloxacin in d5w]; Rocephin [ceftriaxone sodium in dextrose]; Valtrex [valacyclovir hcl]; and Zoloft [sertraline hcl]   Social History: The patient  reports that she has never smoked. She has never used smokeless tobacco. She reports that she does not drink alcohol or use drugs.   ROS:  Please see the history of present illness. Otherwise, complete review of systems is positive for none.  All other systems are reviewed and negative.   Physical Exam: VS:  BP 128/68 (BP Location: Left Arm)   Pulse 75   Ht '5\' 5"'$  (1.651 m)   Wt 203 lb (92.1 kg)   SpO2 97%   BMI 33.78 kg/m , BMI Body mass index is 33.78 kg/m.  Wt Readings from Last 3 Encounters:  01/14/18 203 lb (92.1 kg)  11/13/17 200 lb 6.4 oz (90.9 kg)  10/29/17 201 lb 12.8 oz (91.5 kg)    General: Patient appears comfortable at rest. HEENT: Conjunctiva and lids normal, oropharynx clear. Neck: Supple, no elevated JVP or carotid bruits, no thyromegaly. Lungs: Clear to auscultation, nonlabored breathing at rest. Cardiac: Regular rate and rhythm, no S3, soft systolic murmur. Abdomen: Soft, nontender, bowel sounds present. Extremities: No pitting edema, distal pulses 2+. Skin: Warm and dry. Musculoskeletal: No kyphosis. Neuropsychiatric: Alert and oriented x3, affect grossly appropriate.  ECG: I personally reviewed the tracing from 09/04/2017 which showed normal sinus rhythm with decreased R wave progression.  Recent Labwork: 05/21/2017: TSH 3.510 05/26/2017: Hemoglobin 13.0; Platelets 253 11/13/2017: ALT 22; AST 21; BUN 20; Creatinine, Ser 0.85; Potassium 4.4; Sodium 139     Component Value Date/Time   CHOL 207 (H) 11/13/2017 1433   TRIG 79 11/13/2017 1433   HDL 73 11/13/2017 1433   CHOLHDL 2.8 11/13/2017 1433   CHOLHDL 3.1 02/20/2016 1135   VLDL 18 02/20/2016 1135   LDLCALC 118 (H) 11/13/2017 1433    Other Studies Reviewed Today:  Echocardiogram  12/12/2010: Study Conclusions  - Left ventricle: The cavity size was normal. Mild septal hypertrophy. Systolic function was normal. The estimated ejection fraction was in the range of 60% to 65%. Wall motion was normal; there were no regional wall motion abnormalities. - Aortic valve: Mildly calcified annulus. - Mitral valve: Calcified annulus. - Atrial septum: No defect or patent foramen ovale was identified.  Assessment and Plan:  1.  Essential hypertension, blood pressure is well controlled today on current regimen.  I discussed walking plan with her.  Keep follow-up with Dr. Wolfgang Phoenix.  2.  History of chest pain with previous reassuring ischemic testing.  She does not report any progressive symptoms and I reviewed her ECG from July.  We will continue with observation for now.  3.  Mixed hyperlipidemia, on lovastatin.  Weight loss would also  be beneficial.  4.  Hypothyroidism on Synthroid, TSH normal in March.  She follows with Dr. Wolfgang Phoenix.  Current medicines were reviewed with the patient today.  Disposition: Follow-up in 1 year.  Signed, Satira Sark, MD, [facility] 01/14/2018 9:07 AM    [place] [facility] [address] [place], [place] [address] Phone: [phone]; Fax: [phone]

## 2018-01-14 ENCOUNTER — Ambulatory Visit: Payer: Self-pay | Admitting: Cardiology

## 2018-01-14 ENCOUNTER — Encounter: Payer: Self-pay | Admitting: Cardiology

## 2018-01-14 ENCOUNTER — Ambulatory Visit: Payer: BLUE CROSS/BLUE SHIELD | Admitting: Cardiology

## 2018-01-14 VITALS — BP 128/68 | HR 75 | Ht 65.0 in | Wt 203.0 lb

## 2018-01-14 DIAGNOSIS — Z87898 Personal history of other specified conditions: Secondary | ICD-10-CM

## 2018-01-14 DIAGNOSIS — E782 Mixed hyperlipidemia: Secondary | ICD-10-CM

## 2018-01-14 DIAGNOSIS — E039 Hypothyroidism, unspecified: Secondary | ICD-10-CM | POA: Diagnosis not present

## 2018-01-14 DIAGNOSIS — I10 Essential (primary) hypertension: Secondary | ICD-10-CM | POA: Diagnosis not present

## 2018-01-14 NOTE — Patient Instructions (Signed)
Medication Instructions:  Your physician recommends that you continue on your current medications as directed. Please refer to the Current Medication list given to you today.  If you need a refill on your cardiac medications before your next appointment, please call your pharmacy.   Lab work: none If you have labs (blood work) drawn today and your tests are completely normal, you will receive your results only by: Marland Kitchen MyChart Message (if you have MyChart) OR . A paper copy in the mail If you have any lab test that is abnormal or we need to change your treatment, we will call you to review the results.  Testing/Procedures: none  Follow-Up: At [facility], you and your health needs are our priority.  As part of our continuing mission to provide you with exceptional heart care, we have created designated Provider Care Teams.  These Care Teams include your primary Cardiologist (physician) and Advanced Practice Providers (APPs -  Physician Assistants and Nurse Practitioners) who all work together to provide you with the care you need, when you need it. You will need a follow up appointment in 12 months.  Please call our office 2 months in advance to schedule this appointment.  You may see Rozann Lesches, MD or one of the following Advanced Practice Providers on your designated Care Team:   Bernerd Pho, PA-C [facility]) . Ermalinda Barrios, PA-C ([place])  Any Other Special Instructions Will Be Listed Below (If Applicable).

## 2018-02-04 ENCOUNTER — Other Ambulatory Visit: Payer: BLUE CROSS/BLUE SHIELD | Admitting: Obstetrics & Gynecology

## 2018-02-11 ENCOUNTER — Ambulatory Visit: Payer: BLUE CROSS/BLUE SHIELD | Admitting: Internal Medicine

## 2018-02-11 ENCOUNTER — Encounter: Payer: Self-pay | Admitting: Internal Medicine

## 2018-02-11 VITALS — BP 142/80 | HR 66 | Temp 96.6°F | Ht 65.0 in | Wt 205.0 lb

## 2018-02-11 DIAGNOSIS — R109 Unspecified abdominal pain: Secondary | ICD-10-CM | POA: Diagnosis not present

## 2018-02-11 DIAGNOSIS — K219 Gastro-esophageal reflux disease without esophagitis: Secondary | ICD-10-CM

## 2018-02-11 DIAGNOSIS — K5909 Other constipation: Secondary | ICD-10-CM

## 2018-02-11 NOTE — Patient Instructions (Addendum)
See Dr. Elonda Husky as planned on December 16th  Continue Protonix 40 mg twice daily  If left sided abdominal pain not related to GYN source, we will get a CT scan  Plan for a colonoscopy in 2020  Office visit in 3 months          Thank you for entrusting me with your care. I appreciate the opportunity to create valuable relationships with patients and family members. You may receive a questionnaire in the mail regarding your visit here today.  It would be appreciated if you would take the time to return it.  If you were not completely satisfied with your experience, I would love to discuss any concerns with you.   Bridgette Habermann, M.D. [facility]

## 2018-02-11 NOTE — Progress Notes (Signed)
Primary Care Physician:  [name], MD Primary Gastroenterologist:  Dr. Gala Romney  Pre-Procedure History & Physical: HPI:  Kaitlin [name] is a 62 y.o. female here here for follow-up of reflux and constipation.  Patient complains of a chronic left-sided abdominal pain  - probably had for more than a year now.  Not affected by eating or having a bowel movement.  Pain is been nonprogressive.  She denies fever or chills.  She denies flank pain or any urinary tract symptoms.  Seemingly failed Linzess and MiraLAX.  Caused abdominal cramping.  She has a bowel movement daily to every 3 days.  Has not seen any blood.  History of multiple colonic adenomas; due for surveillance examination 2020.  She is seeing Dr. Elonda Husky about left-sided abdominal pain next week.  She has not had any recent cross-sectional imaging. GERD well-controlled on Protonix 40 mg twice daily.  She still complains of quite a bit of stress in her life.  She wonders if stress may be contributing to any of her GI symptoms.   Past Medical History:  Diagnosis Date  . Anxiety   . Colon polyp APR 2008  . Depression   . Essential hypertension   . Folliculitis 2/58/5277  . Helicobacter pylori gastritis    AUG 2008 PREVPAC: nausea-->HELIDAC: GI UPSET  . Hypothyroidism   . Irritable bowel syndrome   . Kidney stones   . Mixed hyperlipidemia   . Mixed stress and urge urinary incontinence 09/26/2012  . Obesity   . Panic attacks   . Reflux   . Tubulovillous adenoma 08/07/10   Colonoscopy w/ Dr Oneida Alar w/ 7 polyps removed-bx showed tubular adenomas.  Largest 1cm-hepatic flexure polyp->TA.      Past Surgical History:  Procedure Laterality Date  . CHOLECYSTECTOMY    . COLONOSCOPY  APR 2008   AC/[place] SIMPLE ADENOMA  . COLONOSCOPY  08/07/2010   Dr. Raynald Kemp adenomas and tubulovillous adenoma; needs surveillance 2015  . COLONOSCOPY N/A 09/18/2013   Dr.Crockett Rallo- normal rectum, 2 diminutive polys in the mid sigmoid segment o/w the  remainder of the colonic mucosa appeared normal.hyperplastic poylps  . ESOPHAGOGASTRODUODENOSCOPY N/A 11/29/2014   RMR: Gastric erosions. Small hiatal hernia. Status post biopsy.   Marland Kitchen MASS EXCISION  02/28/2011   Procedure: EXCISION MASS;  Surgeon: Donato Heinz, MD;  Location: [facility];  Service: General;  Laterality: Right;  soft tissue mass  . UPPER GASTROINTESTINAL ENDOSCOPY  AUG 2008 PAIN/NAUSEA   H. PYLORI treated    Prior to Admission medications   Medication Sig Start Date End Date Taking? Authorizing Provider  ALPRAZolam Duanne Moron) 1 MG tablet Take 1 tablet (1 mg total) by mouth 3 (three) times daily. 11/13/17  Yes [name], MD  blood glucose meter kit and supplies Dispense based on patient and insurance preference. Test once a day E11.9 07/04/15  Yes [name], MD  enalapril (VASOTEC) 20 MG tablet TAKE 1 TABLET (20 MG TOTAL) BY MOUTH AT BEDTIME. 11/13/17  Yes [name], MD  furosemide (LASIX) 20 MG tablet Take 1 tablet (20 mg total) by mouth daily. Patient taking differently: Take 10 mg by mouth daily.  11/13/17  Yes [name], MD  glucose blood test strip OneTouch Ultra Blue Test Strip  USE TO TEST ONCE DAILY   Yes [provider]  levothyroxine (SYNTHROID, LEVOTHROID) 50 MCG tablet TAKE 1 TABLET BY MOUTH EVERY DAY 11/13/17  Yes [name], MD  lovastatin (MEVACOR) 40 MG tablet  TAKE 1 TABLET BY MOUTH EVERYDAY AT BEDTIME 11/13/17  Yes [name], MD  mometasone (ELOCON) 0.1 % cream Apply to affected area twice daily as needed for itching 01/14/17  Yes [name], MD  pantoprazole (PROTONIX) 40 MG tablet TAKE 1 TABLET (40 MG TOTAL) BY MOUTH 2 (TWO) TIMES DAILY BEFORE A MEAL 12/23/17  Yes Annitta Needs, NP  triamcinolone cream (KENALOG) 0.1 % Apply 1 application 2 (two) times daily topically. 01/08/17  Yes [name], MD  verapamil (CALAN-SR) 240 MG CR tablet Take one tablet by mouth at bedtime Patient taking differently: Take  one tablet by mouth daily 12/24/17  Yes [name], MD  OVER THE COUNTER MEDICATION Vitamin D 2,000 u  As needed    [provider]    Allergies as of 02/11/2018 - Review Complete 02/11/2018  Allergen Reaction Noted  . Asa [aspirin] Other (See Comments) 06/09/2012  . Augmentin [amoxicillin-pot clavulanate] Diarrhea 06/09/2012  . Levaquin [levofloxacin in d5w] Other (See Comments) 07/14/2012  . Rocephin [ceftriaxone sodium in dextrose] Hives and Other (See Comments) 06/09/2012  . Valtrex [valacyclovir hcl] Hives and Other (See Comments) 06/09/2012  . Zoloft [sertraline hcl] Other (See Comments) 06/09/2012    Family History  Problem Relation Age of Onset  . Coronary artery disease Mother   . Hypertension Mother   . Heart attack Mother   . Heart disease Mother   . Suicidality Mother   . Coronary artery disease Brother        Age 64  . Heart attack Brother   . Heart disease Brother   . Diabetes Sister   . Diabetes Sister   . Colon cancer Neg Hx   . Anesthesia problems Neg Hx   . Hypotension Neg Hx   . Malignant hyperthermia Neg Hx   . Pseudochol deficiency Neg Hx     Social History   Socioeconomic History  . Marital status: Married    Spouse name: Not on file  . Number of children: Not on file  . Years of education: Not on file  . Highest education level: Not on file  Occupational History  . Not on file  Social Needs  . Financial resource strain: Not on file  . Food insecurity:    Worry: Not on file    Inability: Not on file  . Transportation needs:    Medical: Not on file    Non-medical: Not on file  Tobacco Use  . Smoking status: Never Smoker  . Smokeless tobacco: Never Used  . Tobacco comment: Never Smoked  Substance and Sexual Activity  . Alcohol use: No  . Drug use: No  . Sexual activity: Never  Lifestyle  . Physical activity:    Days per week: Not on file    Minutes per session: Not on file  . Stress: Not on file  Relationships  .  Social connections:    Talks on phone: Not on file    Gets together: Not on file    Attends religious service: Not on file    Active member of club or organization: Not on file    Attends meetings of clubs or organizations: Not on file    Relationship status: Not on file  . Intimate partner violence:    Fear of current or ex partner: Not on file    Emotionally abused: Not on file    Physically abused: Not on file    Forced sexual activity: Not on file  Other Topics Concern  . Not on file  Social History Narrative  . Not on file    Review of Systems: See HPI, otherwise negative ROS  Physical Exam: BP (!) 142/80   Pulse 66   Temp (!) 96.6 F (35.9 C) (Oral)   Ht '5\' 5"'$  (1.651 m)   Wt 205 lb (93 kg)   BMI 34.11 kg/m  General:   Alert,   pleasant and cooperative in NAD Neck:  Supple; no masses or thyromegaly. No significant cervical adenopathy. Lungs:  Clear throughout to auscultation.   No wheezes, crackles, or rhonchi. No acute distress. Heart:  Regular rate and rhythm; no murmurs, clicks, rubs,  or gallops. Abdomen: Nondistended.  Obese.  Positive bowel sounds.  Nontender without appreciable mass organomegaly  pulses:  Normal pulses noted. Extremities:  Without clubbing or edema.  Impression/Plan: Pleasant 62 year old lady with a long history of GERD-well controlled on PPI treatment but requires twice daily therapy.  No alarm features.  Chronic left mid abdominal pain. Chronic constipation  -  suboptimally managed at this point.  Chronic left-sided abdominal pain.  Nonprogressive.  Doubt an inflammatory process such as diverticulitis. GYN etiology less likely as well.  Has not had any cross-sectional imaging although I suspect the yield may be low at this time.  History of multiple colonic adenomas removed previously; due for surveillance colonoscopy 2020.   Recommendations:  Continue Protonix 40 mg twice daily  If left sided abdominal pain not related to GYN source,  we likely proceed with a CT scan  Plan for a colonoscopy in 2020.  The risks, benefits, limitations, alternatives and imponderables have been reviewed with the patient. Questions have been answered. All parties are agreeable.   Office visit in 3 months      Notice: This dictation was prepared with Dragon dictation along with smaller phrase technology. Any transcriptional errors that result from this process are unintentional and may not be corrected upon review.

## 2018-02-17 ENCOUNTER — Encounter: Payer: Self-pay | Admitting: Obstetrics & Gynecology

## 2018-02-17 ENCOUNTER — Ambulatory Visit: Payer: BLUE CROSS/BLUE SHIELD | Admitting: Obstetrics & Gynecology

## 2018-02-17 ENCOUNTER — Other Ambulatory Visit (HOSPITAL_COMMUNITY)
Admission: RE | Admit: 2018-02-17 | Discharge: 2018-02-17 | Disposition: A | Discharge status: Home / Self Care | Payer: BLUE CROSS/BLUE SHIELD | Source: Ambulatory Visit | Attending: Obstetrics & Gynecology | Admitting: Obstetrics & Gynecology

## 2018-02-17 ENCOUNTER — Encounter (INDEPENDENT_AMBULATORY_CARE_PROVIDER_SITE_OTHER): Payer: Self-pay

## 2018-02-17 VITALS — BP 140/80 | HR 94 | Ht 65.0 in | Wt 207.0 lb

## 2018-02-17 DIAGNOSIS — Z01419 Encounter for gynecological examination (general) (routine) without abnormal findings: Secondary | ICD-10-CM | POA: Diagnosis not present

## 2018-02-17 NOTE — Progress Notes (Signed)
Patient ID: Kaitlin [name], female   DOB: January 15, 1956, 62 y.o.   MRN: [id number] Subjective:     Kaitlin [name] is a 62 y.o. female here for a routine exam.  No LMP recorded. Patient is postmenopausal. G1P1 Birth Control Method:  postmenopausal Menstrual Calendar(currently): amenorrheic  Current complaints: LUQ pain ache sometimes sharp.   Current acute medical issues:     Recent Gynecologic History No LMP recorded. Patient is postmenopausal. Last Pap: 2018,  normal Last mammogram: 06/2017,  normal  Past Medical History:  Diagnosis Date  . Anxiety   . Colon polyp APR 2008  . Depression   . Essential hypertension   . Folliculitis 0/63/0160  . Helicobacter pylori gastritis    AUG 2008 PREVPAC: nausea-->HELIDAC: GI UPSET  . Hypothyroidism   . Irritable bowel syndrome   . Kidney stones   . Mixed hyperlipidemia   . Mixed stress and urge urinary incontinence 09/26/2012  . Obesity   . Panic attacks   . Reflux   . Tubulovillous adenoma 08/07/10   Colonoscopy w/ Dr Oneida Alar w/ 7 polyps removed-bx showed tubular adenomas.  Largest 1cm-hepatic flexure polyp->TA.      Past Surgical History:  Procedure Laterality Date  . CHOLECYSTECTOMY    . COLONOSCOPY  APR 2008   AC/[place] SIMPLE ADENOMA  . COLONOSCOPY  08/07/2010   Dr. Raynald Kemp adenomas and tubulovillous adenoma; needs surveillance 2015  . COLONOSCOPY N/A 09/18/2013   Dr.Rourk- normal rectum, 2 diminutive polys in the mid sigmoid segment o/w the remainder of the colonic mucosa appeared normal.hyperplastic poylps  . ESOPHAGOGASTRODUODENOSCOPY N/A 11/29/2014   RMR: Gastric erosions. Small hiatal hernia. Status post biopsy.   Marland Kitchen MASS EXCISION  02/28/2011   Procedure: EXCISION MASS;  Surgeon: Donato Heinz, MD;  Location: [facility];  Service: General;  Laterality: Right;  soft tissue mass  . UPPER GASTROINTESTINAL ENDOSCOPY  AUG 2008 PAIN/NAUSEA   H. PYLORI treated    OB History    Gravida  1   Para  1   Term      Preterm      AB      Living        SAB      TAB      Ectopic      Multiple      Live Births              Social History   Socioeconomic History  . Marital status: Married    Spouse name: Not on file  . Number of children: 1  . Years of education: Not on file  . Highest education level: Not on file  Occupational History  . Not on file  Social Needs  . Financial resource strain: Not on file  . Food insecurity:    Worry: Not on file    Inability: Not on file  . Transportation needs:    Medical: Not on file    Non-medical: Not on file  Tobacco Use  . Smoking status: Never Smoker  . Smokeless tobacco: Never Used  . Tobacco comment: Never Smoked  Substance and Sexual Activity  . Alcohol use: No  . Drug use: No  . Sexual activity: Never  Lifestyle  . Physical activity:    Days per week: Not on file    Minutes per session: Not on file  . Stress: Not on file  Relationships  . Social connections:    Talks on phone: Not on file    Gets together:  Not on file    Attends religious service: Not on file    Active member of club or organization: Not on file    Attends meetings of clubs or organizations: Not on file    Relationship status: Not on file  Other Topics Concern  . Not on file  Social History Narrative  . Not on file    Family History  Problem Relation Age of Onset  . Coronary artery disease Mother   . Hypertension Mother   . Heart attack Mother   . Heart disease Mother   . Suicidality Mother   . Coronary artery disease Brother        Age 20  . Heart attack Brother   . Heart disease Brother   . Diabetes Sister   . Diabetes Sister   . Colon cancer Neg Hx   . Anesthesia problems Neg Hx   . Hypotension Neg Hx   . Malignant hyperthermia Neg Hx   . Pseudochol deficiency Neg Hx      Current Outpatient Medications:  .  ALPRAZolam (XANAX) 1 MG tablet, Take 1 tablet (1 mg total) by mouth 3 (three) times daily., Disp: 90 tablet, Rfl: 5 .  blood glucose meter  kit and supplies, Dispense based on patient and insurance preference. Test once a day E11.9, Disp: 1 each, Rfl: 0 .  enalapril (VASOTEC) 20 MG tablet, TAKE 1 TABLET (20 MG TOTAL) BY MOUTH AT BEDTIME., Disp: 30 tablet, Rfl: 5 .  furosemide (LASIX) 20 MG tablet, Take 1 tablet (20 mg total) by mouth daily. (Patient taking differently: Take 10 mg by mouth daily. ), Disp: 30 tablet, Rfl: 5 .  glucose blood test strip, OneTouch Ultra Blue Test Strip  USE TO TEST ONCE DAILY, Disp: , Rfl:  .  levothyroxine (SYNTHROID, LEVOTHROID) 50 MCG tablet, TAKE 1 TABLET BY MOUTH EVERY DAY, Disp: 30 tablet, Rfl: 5 .  lovastatin (MEVACOR) 40 MG tablet, TAKE 1 TABLET BY MOUTH EVERYDAY AT BEDTIME, Disp: 90 tablet, Rfl: 1 .  mometasone (ELOCON) 0.1 % cream, Apply to affected area twice daily as needed for itching, Disp: 60 g, Rfl: 0 .  OVER THE COUNTER MEDICATION, Vitamin D 2,000 u  As needed, Disp: , Rfl:  .  pantoprazole (PROTONIX) 40 MG tablet, TAKE 1 TABLET (40 MG TOTAL) BY MOUTH 2 (TWO) TIMES DAILY BEFORE A MEAL, Disp: 60 tablet, Rfl: 3 .  triamcinolone cream (KENALOG) 0.1 %, Apply 1 application 2 (two) times daily topically., Disp: 30 g, Rfl: 0 .  verapamil (CALAN-SR) 240 MG CR tablet, Take one tablet by mouth at bedtime (Patient taking differently: Take one tablet by mouth daily), Disp: 30 tablet, Rfl: 5  Review of Systems  Review of Systems  Constitutional: Negative for fever, chills, weight loss, malaise/fatigue and diaphoresis.  HENT: Negative for hearing loss, ear pain, nosebleeds, congestion, sore throat, neck pain, tinnitus and ear discharge.   Eyes: Negative for blurred vision, double vision, photophobia, pain, discharge and redness.  Respiratory: Negative for cough, hemoptysis, sputum production, shortness of breath, wheezing and stridor.   Cardiovascular: Negative for chest pain, palpitations, orthopnea, claudication, leg swelling and PND.  Gastrointestinal: negative for abdominal pain. Negative for  heartburn, nausea, vomiting, diarrhea, constipation, blood in stool and melena.  Genitourinary: Negative for dysuria, urgency, frequency, hematuria and flank pain.  Musculoskeletal: Negative for myalgias, back pain, joint pain and falls.  Skin: Negative for itching and rash.  Neurological: Negative for dizziness, tingling, tremors, sensory change, speech change, focal weakness,  seizures, loss of consciousness, weakness and headaches.  Endo/Heme/Allergies: Negative for environmental allergies and polydipsia. Does not bruise/bleed easily.  Psychiatric/Behavioral: Negative for depression, suicidal ideas, hallucinations, memory loss and substance abuse. The patient is not nervous/anxious and does not have insomnia.        Objective:  Blood pressure 140/80, pulse 94, height '5\' 5"'$  (1.651 m), weight 207 lb (93.9 kg).   Physical Exam  Vitals reviewed. Constitutional: She is oriented to person, place, and time. She appears well-developed and well-nourished.  HENT:  Head: Normocephalic and atraumatic.        Right Ear: External ear normal.  Left Ear: External ear normal.  Nose: Nose normal.  Mouth/Throat: Oropharynx is clear and moist.  Eyes: Conjunctivae and EOM are normal. Pupils are equal, round, and reactive to light. Right eye exhibits no discharge. Left eye exhibits no discharge. No scleral icterus.  Neck: Normal range of motion. Neck supple. No tracheal deviation present. No thyromegaly present.  Cardiovascular: Normal rate, regular rhythm, normal heart sounds and intact distal pulses.  Exam reveals no gallop and no friction rub.   No murmur heard. Respiratory: Effort normal and breath sounds normal. No respiratory distress. She has no wheezes. She has no rales. She exhibits no tenderness.  GI: Soft. Bowel sounds are normal. She exhibits no distension and no mass. There is no tenderness. There is no rebound and no guarding.  Genitourinary:  Breasts no masses skin changes or nipple changes  bilaterally      Vulva is normal without lesions Vagina is pink moist without discharge Cervix normal in appearance and pap is done Uterus is normal size shape and contour Adnexa is negative with normal sized ovaries   Musculoskeletal: Normal range of motion. She exhibits no edema and no tenderness.  Neurological: She is alert and oriented to person, place, and time. She has normal reflexes. She displays normal reflexes. No cranial nerve deficit. She exhibits normal muscle tone. Coordination normal.  Skin: Skin is warm and dry. No rash noted. No erythema. No pallor.  Psychiatric: She has a normal mood and affect. Her behavior is normal. Judgment and thought content normal.       Medications Ordered at today's visit: No orders of the defined types were placed in this encounter.   Other orders placed at today's visit: No orders of the defined types were placed in this encounter.     Assessment:    Healthy female exam.   LUQ pain which seems to be abdominal wall pain, not intra abdominal Plan:    Mammogram ordered. Follow up in: 1 year.     Return in about 1 year (around 02/18/2019) for yearly.

## 2018-02-20 LAB — CYTOLOGY - PAP
Diagnosis: NEGATIVE
HPV: NOT DETECTED

## 2018-03-04 ENCOUNTER — Ambulatory Visit: Payer: BLUE CROSS/BLUE SHIELD | Admitting: Family Medicine

## 2018-03-04 ENCOUNTER — Encounter: Payer: Self-pay | Admitting: Family Medicine

## 2018-03-04 VITALS — BP 120/80 | Temp 98.2°F | Ht 65.0 in | Wt 210.2 lb

## 2018-03-04 DIAGNOSIS — J329 Chronic sinusitis, unspecified: Secondary | ICD-10-CM

## 2018-03-04 DIAGNOSIS — J310 Chronic rhinitis: Secondary | ICD-10-CM

## 2018-03-04 MED ORDER — AZITHROMYCIN 250 MG PO TABS
ORAL_TABLET | ORAL | 0 refills | Status: DC
Start: 2018-03-04 — End: 2018-04-27

## 2018-03-04 NOTE — Progress Notes (Signed)
   Subjective:    Patient ID: Kaitlin [name], female    DOB: [birth year], 62 y.o.   MRN: [id number]  Cough  This is a new problem. The current episode started in the past 7 days. Associated symptoms include ear pain, headaches, nasal congestion and a sore throat. She has tried OTC cough suppressant for the symptoms.    Hit on Saturday  Pt was outdoors and feeling ok   By sat night got real sick  Throat very painful ane c,losing   Had a bad cough     Had chills   Felt hot and cold and aching   Right  Ear painful  Some productive on the cough  Frontal h a fairly severe   congestd ad stufy      di   Review of Systems  HENT: Positive for ear pain and sore throat.   Respiratory: Positive for cough.   Neurological: Positive for headaches.       Objective:   Physical Exam  Alert, mild malaise. Hydration good Vitals stable. Right OMfrontal/ maxillary tenderness evident positive nasal congestion. pharynx normal neck supple  lungs clear/no crackles or wheezes. heart regular in rhythm       Assessment & Plan:  Impression rhinosinusitis likely post viral, discussed with patient. plan antibiotics prescribed. Questions answered. Symptomatic care discussed. warning signs discussed. WSL

## 2018-04-07 ENCOUNTER — Ambulatory Visit: Payer: BLUE CROSS/BLUE SHIELD | Admitting: Family Medicine

## 2018-04-07 ENCOUNTER — Encounter: Payer: Self-pay | Admitting: Family Medicine

## 2018-04-07 VITALS — BP 128/90 | Temp 98.4°F | Wt 209.4 lb

## 2018-04-07 DIAGNOSIS — J329 Chronic sinusitis, unspecified: Secondary | ICD-10-CM

## 2018-04-07 DIAGNOSIS — M179 Osteoarthritis of knee, unspecified: Secondary | ICD-10-CM | POA: Insufficient documentation

## 2018-04-07 DIAGNOSIS — M1710 Unilateral primary osteoarthritis, unspecified knee: Secondary | ICD-10-CM | POA: Insufficient documentation

## 2018-04-07 MED ORDER — DOXYCYCLINE HYCLATE 100 MG PO TABS
ORAL_TABLET | ORAL | 0 refills | Status: DC
Start: 2018-04-07 — End: 2018-04-07

## 2018-04-07 MED ORDER — DOXYCYCLINE HYCLATE 100 MG PO TABS: ORAL_TABLET | ORAL | 0 refills | Status: DC

## 2018-04-07 NOTE — Progress Notes (Signed)
   Subjective:    Patient ID: Kaitlin [name], female    DOB: 1955/03/26, 63 y.o.   MRN: [id number]  Cough  This is a recurrent problem. The current episode started more than 1 month ago (last week in December pt had flu). The cough is productive of sputum. Associated symptoms include chills, ear pain, headaches and rhinorrhea. Associated symptoms comments: Watery eyes, scratchy throat. She has tried nothing for the symptoms.   Frontal headache and coug and cong  Feels cold and stoped up  Dn energy       Review of Systems  Constitutional: Positive for chills.  HENT: Positive for ear pain and rhinorrhea.   Respiratory: Positive for cough.   Neurological: Positive for headaches.       Objective:   Physical Exam  Alert, mild malaise. Hydration good Vitals stable. frontal/ maxillary tenderness evident positive nasal congestion. pharynx normal neck supple  lungs clear/no crackles or wheezes. heart regular in rhythm       Assessment & Plan:  Impression rhinosinusitis likely post viral, discussed with patient. plan antibiotics prescribed. Questions answered. Symptomatic care discussed. warning signs discussed. WSL

## 2018-04-17 ENCOUNTER — Ambulatory Visit: Payer: BLUE CROSS/BLUE SHIELD | Admitting: Obstetrics and Gynecology

## 2018-04-18 ENCOUNTER — Telehealth: Payer: Self-pay

## 2018-04-18 ENCOUNTER — Ambulatory Visit (HOSPITAL_COMMUNITY)
Admission: RE | Admit: 2018-04-18 | Discharge: 2018-04-18 | Disposition: A | Discharge status: Home / Self Care | Payer: BLUE CROSS/BLUE SHIELD | Source: Ambulatory Visit | Attending: Family Medicine | Admitting: Family Medicine

## 2018-04-18 ENCOUNTER — Encounter: Payer: Self-pay | Admitting: Family Medicine

## 2018-04-18 ENCOUNTER — Other Ambulatory Visit (HOSPITAL_COMMUNITY)
Admission: RE | Admit: 2018-04-18 | Discharge: 2018-04-18 | Disposition: A | Discharge status: Home / Self Care | Payer: BLUE CROSS/BLUE SHIELD | Source: Ambulatory Visit | Attending: Family Medicine | Admitting: Family Medicine

## 2018-04-18 ENCOUNTER — Ambulatory Visit: Payer: BLUE CROSS/BLUE SHIELD | Admitting: Family Medicine

## 2018-04-18 VITALS — BP 140/88 | HR 79 | Temp 97.4°F | Ht 65.0 in | Wt 205.0 lb

## 2018-04-18 DIAGNOSIS — J019 Acute sinusitis, unspecified: Secondary | ICD-10-CM | POA: Diagnosis not present

## 2018-04-18 DIAGNOSIS — R05 Cough: Secondary | ICD-10-CM | POA: Diagnosis not present

## 2018-04-18 DIAGNOSIS — R300 Dysuria: Secondary | ICD-10-CM

## 2018-04-18 DIAGNOSIS — R059 Cough, unspecified: Secondary | ICD-10-CM

## 2018-04-18 LAB — POCT URINALYSIS DIPSTICK
LEUKOCYTES UA: NEGATIVE
RBC UA: NEGATIVE
SPEC GRAV UA: 1.025 (ref 1.010–1.025)
pH, UA: 5 (ref 5.0–8.0)

## 2018-04-18 LAB — CBC WITH DIFFERENTIAL/PLATELET
Abs Immature Granulocytes: 0.03 10*3/uL (ref 0.00–0.07)
Basophils Absolute: 0.1 10*3/uL (ref 0.0–0.1)
Basophils Relative: 1 %
Eosinophils Absolute: 0.3 10*3/uL (ref 0.0–0.5)
Eosinophils Relative: 3 %
HCT: 44.7 % (ref 36.0–46.0)
Hemoglobin: 14.2 g/dL (ref 12.0–15.0)
Immature Granulocytes: 0 %
Lymphocytes Relative: 33 %
Lymphs Abs: 2.8 10*3/uL (ref 0.7–4.0)
MCH: 27.5 pg (ref 26.0–34.0)
MCHC: 31.8 g/dL (ref 30.0–36.0)
MCV: 86.5 fL (ref 80.0–100.0)
Monocytes Absolute: 0.7 10*3/uL (ref 0.1–1.0)
Monocytes Relative: 8 %
NRBC: 0 % (ref 0.0–0.2)
Neutro Abs: 4.6 10*3/uL (ref 1.7–7.7)
Neutrophils Relative %: 55 %
Platelets: 301 10*3/uL (ref 150–400)
RBC: 5.17 MIL/uL — ABNORMAL HIGH (ref 3.87–5.11)
RDW: 13.1 % (ref 11.5–15.5)
WBC: 8.4 10*3/uL (ref 4.0–10.5)

## 2018-04-18 IMAGING — DX DG CHEST 2V
2 series · 2 of 2 positions shown · non-contrast
Comparison: Radiographs [DATE] and [DATE].

CLINICAL DATA: Productive cough. Sinus issue since flu 2 months
ago.

EXAM:
CHEST - 2 VIEW

[chest pa]
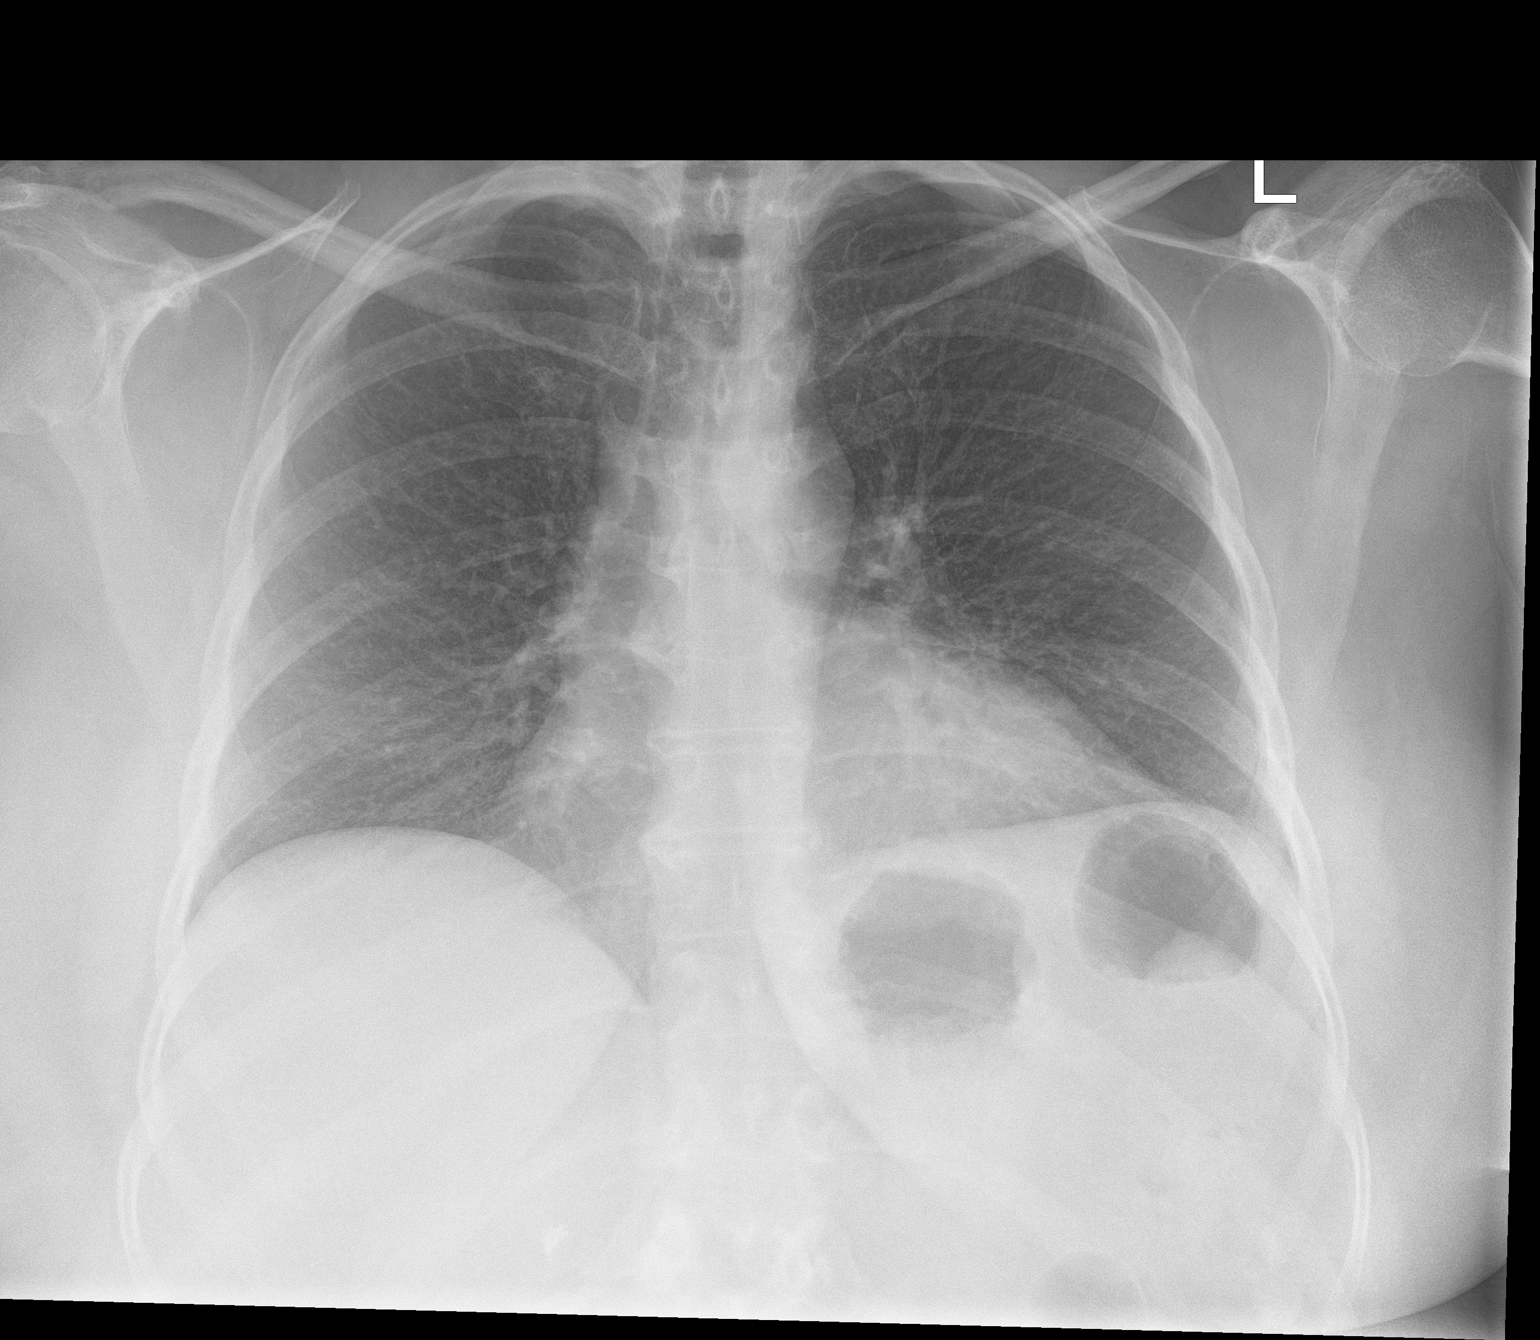

[chest lat]
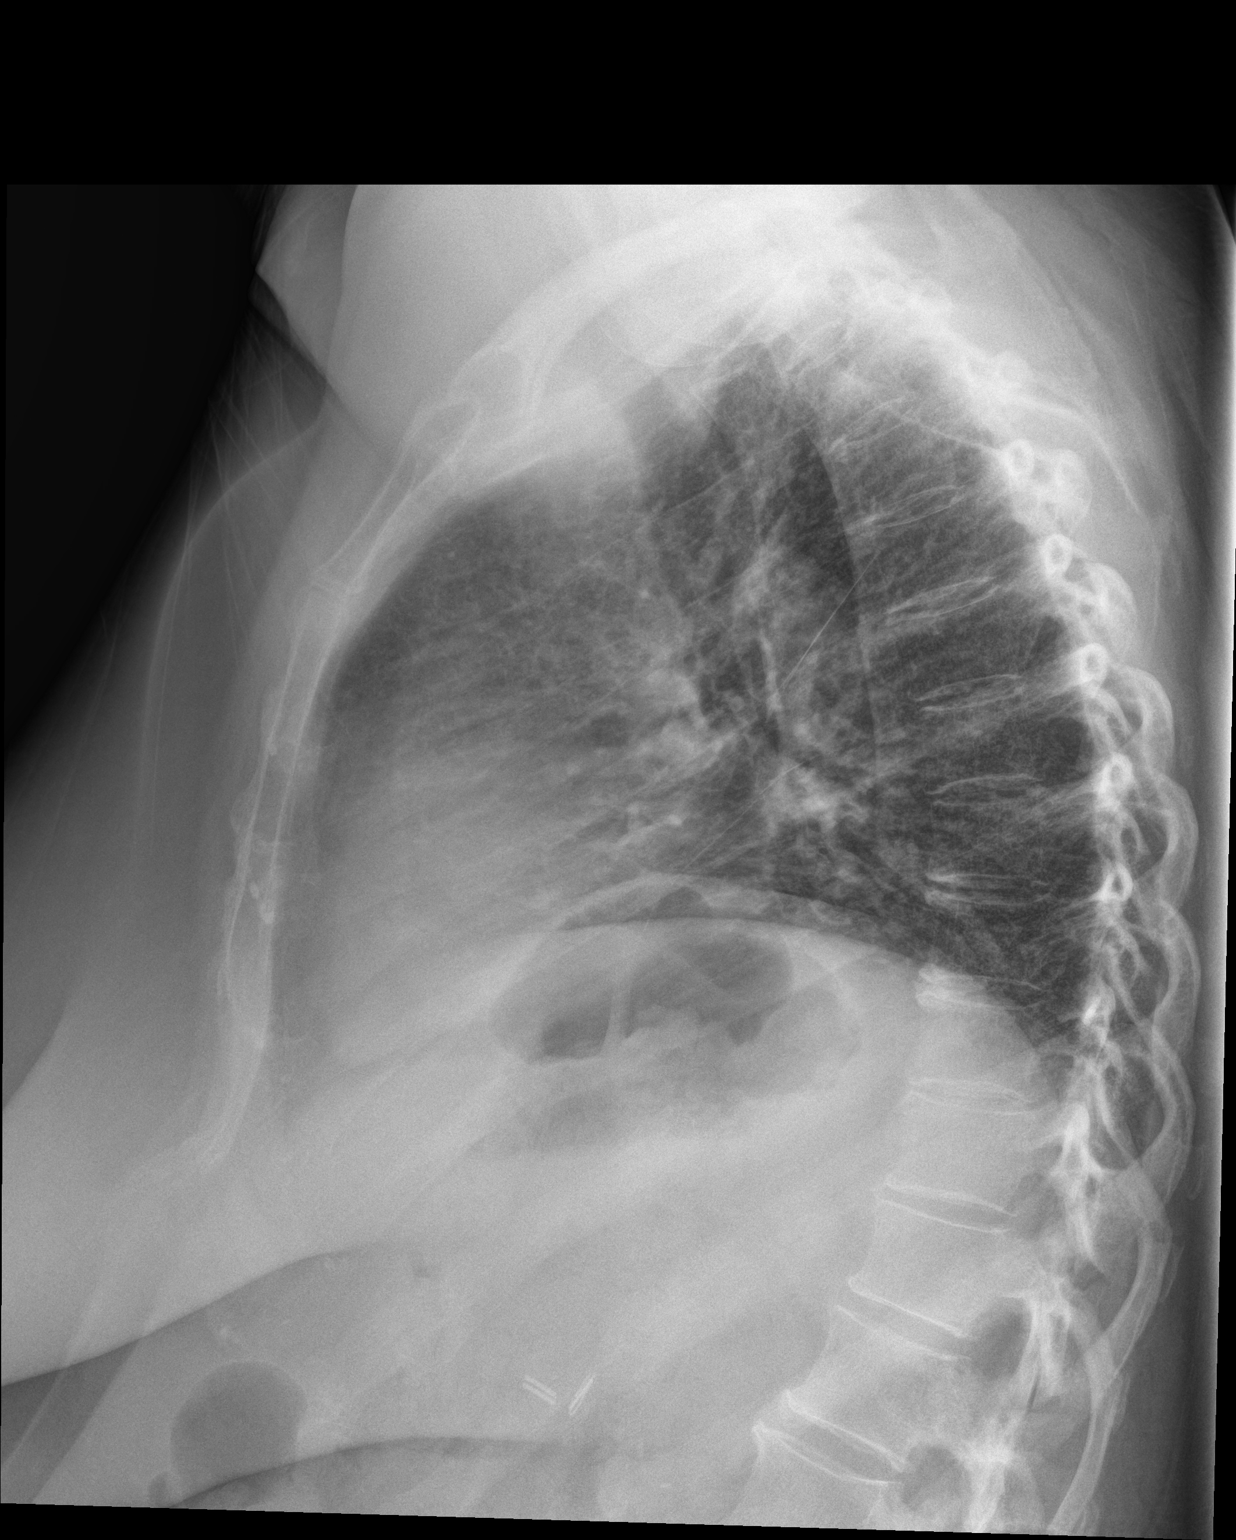

[2 of 2 positions shown; findings below may reference images not displayed]

FINDINGS: The heart size and mediastinal contours are stable. There are low
lung volumes with chronic left-greater-than-right basilar
atelectasis, similar to previous studies. No superimposed airspace
disease, edema, pleural effusion or pneumothorax. The bones appear
unchanged.
IMPRESSION: Stable chest with chronic bibasilar atelectasis. No acute
cardiopulmonary process.

## 2018-04-18 MED ORDER — CLARITHROMYCIN 500 MG PO TABS: 500.0000 mg | ORAL_TABLET | Freq: Two times a day (BID) | ORAL | 0 refills | Status: DC

## 2018-04-18 NOTE — Progress Notes (Signed)
Subjective:    Patient ID: Kaitlin [name], female    DOB: 30-Jun-1955, 63 y.o.   MRN: [id number]  HPI  Patient is here today with complaints of a cough,runny nose,right ear pain,eyes watering,painful urination, right lower abdominal pain sharp and dull, and heart racing, right side of head pain,sinus drainage, chills,no fever,swelling in both ankles she supposed to take Furosemide 20 mg one per day,but the Patient says her bottle states 10 mg and she takes 1/2 of it.  All these symptoms began after Christmas and she states she was diagnosed with the flu and has not gotten better since. Was seen here on 03/04/2018 given a z pak, then on 04/07/2018 was given Doxycyline 100 mg bid.  Patient states this sickness has been on going for too long.  2 day hx of constant RLQ abdominal pain, sharp. No N/V. No fever.   Reports woke up this morning with pressure to right side of her head. Has resolved currently. States heart racing only occurred this morning after jumping out of bed quickly, has not had any problems with this before or since.    Review of Systems  Constitutional: Negative for fever.  HENT: Positive for rhinorrhea and sneezing.   Respiratory: Positive for cough.   Cardiovascular: Positive for leg swelling. Negative for chest pain.  Gastrointestinal: Positive for abdominal pain. Negative for blood in stool, constipation, diarrhea, nausea and vomiting.  Genitourinary: Positive for dysuria. Negative for hematuria, vaginal bleeding and vaginal discharge.  Neurological: Positive for headaches.       Objective:   Physical Exam Vitals signs and nursing note reviewed.  Constitutional:      General: She is not in acute distress.    Appearance: She is not toxic-appearing.  HENT:     Head: Normocephalic and atraumatic.     Right Ear: Tympanic membrane normal.     Left Ear: Tympanic membrane normal.     Nose: Nose normal.     Mouth/Throat:     Mouth: Mucous membranes are moist.    Pharynx: Oropharynx is clear.  Eyes:     General:        Right eye: No discharge.        Left eye: No discharge.     Conjunctiva/sclera: Conjunctivae normal.     Pupils: Pupils are equal, round, and reactive to light.  Neck:     Musculoskeletal: Neck supple. No neck rigidity.  Cardiovascular:     Rate and Rhythm: Normal rate and regular rhythm.     Heart sounds: Normal heart sounds.  Pulmonary:     Effort: Pulmonary effort is normal. No respiratory distress.     Breath sounds: Normal breath sounds. No wheezing or rales.  Abdominal:     General: Bowel sounds are normal. There is no distension.     Palpations: Abdomen is soft. There is no mass.     Tenderness: There is abdominal tenderness (mild to RLQ; worse to right groin area. ). There is no guarding.  Musculoskeletal:     Right lower leg: Edema (non-pitting) present.     Left lower leg: Edema (non pitting) present.  Lymphadenopathy:     Cervical: No cervical adenopathy.  Skin:    General: Skin is warm and dry.  Neurological:     Mental Status: She is alert and oriented to person, place, and time.    Results for orders placed or performed in visit on 04/18/18  POCT urinalysis dipstick  Result Value Ref Range  Color, UA     Clarity, UA     Glucose, UA     Bilirubin, UA     Ketones, UA     Spec Grav, UA 1.025 1.010 - 1.025   Blood, UA Negative    pH, UA 5.0 5.0 - 8.0   Protein, UA     Urobilinogen, UA     Nitrite, UA     Leukocytes, UA Negative Negative   Appearance     Odor     Microscopic exam of urine: No WBCs, No RBCs, Occasional epithelial cell  Today's Vitals   04/18/18 1058  BP: 140/88  Pulse: 79  Temp: (!) 97.4 F (36.3 C)  TempSrc: Oral  SpO2: 97%  Weight: 205 lb 0.6 oz (93 kg)  Height: 5\' 5"  (1.651 m)   Body mass index is 34.12 kg/m.     Assessment & Plan:  Acute rhinosinusitis - Plan: CBC with Differential/Platelet, DG Chest 2 View  Cough - Plan: CBC with Differential/Platelet, DG Chest  2 View  Dysuria - Plan: POCT urinalysis dipstick   Strongly feel this is likely another separate viral infection on top of resolving symptoms from previous infections.  Discussed this with the patient.  Given the patient reports symptoms have been ongoing since December we will go ahead and obtain a stat chest x-ray and CBC.  Will treat with Biaxin x14 days to cover for potential bacterial infection.  Recommended to patient to start taking a probiotic supplement given significant antibiotic use of the last few months.  Urinalysis clear on exam today.  Do not feel this is a UTI.  I do not believe that acute appendicitis is a concern for this patient at this time.  Discussed the worsening pain to the right upper groin area is more likely related to abdominal muscle strain. Will notify patient of her results. F/u if symptoms worsen or fail to improve.   Dr. Mickie Hillier was consulted on this case and is in agreement with the above treatment plan.

## 2018-04-18 NOTE — Telephone Encounter (Signed)
Lab work and xray are ready. I placed on your desk.

## 2018-04-18 NOTE — Telephone Encounter (Signed)
Result note sent to the nurse pool. Thanks!

## 2018-04-18 NOTE — Telephone Encounter (Signed)
Noted  

## 2018-04-27 ENCOUNTER — Emergency Department (HOSPITAL_COMMUNITY)
Admission: EM | Admit: 2018-04-27 | Discharge: 2018-04-27 | Disposition: A | Discharge status: Home / Self Care | Payer: BLUE CROSS/BLUE SHIELD | Attending: Emergency Medicine | Admitting: Emergency Medicine

## 2018-04-27 ENCOUNTER — Other Ambulatory Visit: Payer: Self-pay

## 2018-04-27 ENCOUNTER — Encounter (HOSPITAL_COMMUNITY): Payer: Self-pay

## 2018-04-27 DIAGNOSIS — R05 Cough: Secondary | ICD-10-CM | POA: Diagnosis not present

## 2018-04-27 DIAGNOSIS — Z79899 Other long term (current) drug therapy: Secondary | ICD-10-CM | POA: Diagnosis not present

## 2018-04-27 DIAGNOSIS — G501 Atypical facial pain: Secondary | ICD-10-CM | POA: Diagnosis not present

## 2018-04-27 DIAGNOSIS — J329 Chronic sinusitis, unspecified: Secondary | ICD-10-CM | POA: Diagnosis not present

## 2018-04-27 DIAGNOSIS — J310 Chronic rhinitis: Secondary | ICD-10-CM

## 2018-04-27 DIAGNOSIS — R067 Sneezing: Secondary | ICD-10-CM | POA: Diagnosis not present

## 2018-04-27 DIAGNOSIS — I10 Essential (primary) hypertension: Secondary | ICD-10-CM | POA: Insufficient documentation

## 2018-04-27 DIAGNOSIS — H9202 Otalgia, left ear: Secondary | ICD-10-CM | POA: Diagnosis not present

## 2018-04-27 DIAGNOSIS — R0981 Nasal congestion: Secondary | ICD-10-CM | POA: Diagnosis present

## 2018-04-27 MED ORDER — FLUTICASONE PROPIONATE 50 MCG/ACT NA SUSP: 2.0000 | Freq: Every day | NASAL | 0 refills | Status: DC

## 2018-04-27 MED ORDER — PREDNISONE 50 MG PO TABS
60.0000 mg | ORAL_TABLET | Freq: Once | ORAL | Status: AC
  Administered 2018-04-27: 60 mg via ORAL
  Filled 2018-04-27: qty 1

## 2018-04-27 MED ORDER — PREDNISONE 20 MG PO TABS: ORAL_TABLET | ORAL | 0 refills | Status: DC

## 2018-04-27 NOTE — ED Provider Notes (Signed)
[facility] EMERGENCY DEPARTMENT Provider Note   CSN: [id number] Arrival date & time: 04/27/18  2423    History   Chief Complaint Chief Complaint  Patient presents with  . Recurrent Sinusitis    HPI Kaitlin [name] is a 63 y.o. female.   The history is provided by the patient.  She has history of hypertension, hyperlipidemia, prediabetes and comes in with ongoing difficulty with nasal congestion and facial pain with associated frequent sneezing.  Symptoms have been present for greater than 6 weeks and she has seen her PCP who has prescribed her 3 different courses of antibiotics.  She never got any relief.  She complains of some nasal congestion with clear rhinorrhea.  There is also pain in her left ear.  She denies any sore throat.  There is a slight cough productive of some clear sputum.  There is been no nausea, vomiting, diarrhea.  She denies fever or chills and denies arthralgias or myalgias.  Past Medical History:  Diagnosis Date  . Anxiety   . Colon polyp APR 2008  . Depression   . Essential hypertension   . Folliculitis 5/36/1443  . Helicobacter pylori gastritis    AUG 2008 PREVPAC: nausea-->HELIDAC: GI UPSET  . Hypothyroidism   . Irritable bowel syndrome   . Kidney stones   . Mixed hyperlipidemia   . Mixed stress and urge urinary incontinence 09/26/2012  . Obesity   . Panic attacks   . Reflux   . Tubulovillous adenoma 08/07/10   Colonoscopy w/ Dr Oneida Alar w/ 7 polyps removed-bx showed tubular adenomas.  Largest 1cm-hepatic flexure polyp->TA.      Patient Active Problem List   Diagnosis Date Noted  . Loss of weight 05/27/2017  . PTSD (post-traumatic stress disorder) 04/09/2017  . MDD (major depressive disorder), recurrent episode, moderate ([place]) 04/09/2017  . Phobia to insects 04/09/2017  . Prediabetes 12/20/2016  . Abdominal pain, epigastric 02/20/2016  . GERD (gastroesophageal reflux disease) 02/20/2016  . Mucosal abnormality of stomach   . Hiatal hernia     . Impaired fasting glucose 06/04/2014  . Benign paroxysmal positional vertigo 06/04/2014  . Constipation 02/17/2014  . Elevated parathyroid hormone 05/24/2013  . Folliculitis 15/40/0867  . Mixed stress and urge urinary incontinence 09/26/2012  . Subserous leiomyoma of uterus 09/01/2012  . Hypothyroidism 06/12/2012  . Venous stasis 06/12/2012  . Essential hypertension, benign 12/11/2010  . Chest pain 12/11/2010  . IBS (irritable bowel syndrome) 07/05/2010  . Hx of adenomatous colonic polyps 07/05/2010    Past Surgical History:  Procedure Laterality Date  . CHOLECYSTECTOMY    . COLONOSCOPY  APR 2008   AC/[place] SIMPLE ADENOMA  . COLONOSCOPY  08/07/2010   Dr. Raynald Kemp adenomas and tubulovillous adenoma; needs surveillance 2015  . COLONOSCOPY N/A 09/18/2013   Dr.Rourk- normal rectum, 2 diminutive polys in the mid sigmoid segment o/w the remainder of the colonic mucosa appeared normal.hyperplastic poylps  . ESOPHAGOGASTRODUODENOSCOPY N/A 11/29/2014   RMR: Gastric erosions. Small hiatal hernia. Status post biopsy.   Marland Kitchen MASS EXCISION  02/28/2011   Procedure: EXCISION MASS;  Surgeon: Donato Heinz, MD;  Location: [facility];  Service: General;  Laterality: Right;  soft tissue mass  . UPPER GASTROINTESTINAL ENDOSCOPY  AUG 2008 PAIN/NAUSEA   H. PYLORI treated     OB History    Gravida  1   Para  1   Term      Preterm      AB      Living  SAB      TAB      Ectopic      Multiple      Live Births               Home Medications    Prior to Admission medications   Medication Sig Start Date End Date Taking? Authorizing Provider  ALPRAZolam Duanne Moron) 1 MG tablet Take 1 tablet (1 mg total) by mouth 3 (three) times daily. 11/13/17   [name], MD  azithromycin (ZITHROMAX Z-PAK) 250 MG tablet Take 2 tablets (500 mg) on  Day 1,  followed by 1 tablet (250 mg) once daily on Days 2 through 5. 03/04/18   Wolfgang Phoenix Grace Bushy, MD  blood glucose meter kit and supplies  Dispense based on patient and insurance preference. Test once a day E11.9 07/04/15   [name], MD  clarithromycin (BIAXIN) 500 MG tablet Take 1 tablet (500 mg total) by mouth 2 (two) times daily. 04/18/18   Cheyenne Adas, NP  doxycycline (VIBRA-TABS) 100 MG tablet Take one tablet by mouth twice daily for 14 days 04/07/18   [name], MD  enalapril (VASOTEC) 20 MG tablet TAKE 1 TABLET (20 MG TOTAL) BY MOUTH AT BEDTIME. 11/13/17   [name], MD  furosemide (LASIX) 20 MG tablet Take 1 tablet (20 mg total) by mouth daily. Patient taking differently: Take 10 mg by mouth daily.  11/13/17   [name], MD  glucose blood test strip OneTouch Ultra Blue Test Strip  USE TO TEST ONCE DAILY    [provider]  levothyroxine (SYNTHROID, LEVOTHROID) 50 MCG tablet TAKE 1 TABLET BY MOUTH EVERY DAY 11/13/17   [name], MD  lovastatin (MEVACOR) 40 MG tablet TAKE 1 TABLET BY MOUTH EVERYDAY AT BEDTIME 11/13/17   [name], MD  mometasone (ELOCON) 0.1 % cream Apply to affected area twice daily as needed for itching 01/14/17   [name], MD  OVER THE COUNTER MEDICATION Vitamin D 2,000 u  As needed    [provider]  pantoprazole (PROTONIX) 40 MG tablet TAKE 1 TABLET (40 MG TOTAL) BY MOUTH 2 (TWO) TIMES DAILY BEFORE A MEAL 12/23/17   Annitta Needs, NP  triamcinolone cream (KENALOG) 0.1 % Apply 1 application 2 (two) times daily topically. 01/08/17   [name], MD  verapamil (CALAN-SR) 240 MG CR tablet Take one tablet by mouth at bedtime Patient taking differently: Take one tablet by mouth daily 12/24/17   [name], MD    Family History Family History  Problem Relation Age of Onset  . Coronary artery disease Mother   . Hypertension Mother   . Heart attack Mother   . Heart disease Mother   . Suicidality Mother   . Coronary artery disease Brother        Age 44  . Heart attack Brother   . Heart disease Brother   . Diabetes Sister    . Diabetes Sister   . Colon cancer Neg Hx   . Anesthesia problems Neg Hx   . Hypotension Neg Hx   . Malignant hyperthermia Neg Hx   . Pseudochol deficiency Neg Hx     Social History Social History   Tobacco Use  . Smoking status: Never Smoker  . Smokeless tobacco: Never Used  . Tobacco comment: Never Smoked  Substance Use Topics  . Alcohol use: No  . Drug use: No     Allergies   Diona Fanti [  aspirin]; Augmentin [amoxicillin-pot clavulanate]; Levaquin [levofloxacin in d5w]; Rocephin [ceftriaxone sodium in dextrose]; Valtrex [valacyclovir hcl]; and Zoloft [sertraline hcl]   Review of Systems Review of Systems  All other systems reviewed and are negative.    Physical Exam Updated Vital Signs BP (!) 156/83   Pulse 72   Temp 97.8 F (36.6 C) (Oral)   Resp 19   Ht _0  (1.651 m)   Wt 93 kg   SpO2 98%   BMI 34.12 kg/m   Physical Exam Vitals signs and nursing note reviewed.    63 year old female, resting comfortably and in no acute distress. Vital signs are significant for elevated systolic blood pressure. Oxygen saturation is 98%, which is normal. Head is normocephalic and atraumatic. PERRLA, EOMI. Oropharynx is clear. Neck is nontender and supple without adenopathy or JVD.  Tympanic membranes are clear.  Examination of nasal cavity shows no edema of the turbinates and no purulent nasal drainage.  There is mild tenderness to palpation over frontal and maxillary sinuses. Back is nontender and there is no CVA tenderness. Lungs are clear without rales, wheezes, or rhonchi. Chest is nontender. Heart has regular rate and rhythm without murmur. Abdomen is soft, flat, nontender without masses or hepatosplenomegaly and peristalsis is normoactive. Extremities have no cyanosis or edema, full range of motion is present. Skin is warm and dry without rash. Neurologic: Mental status is normal, cranial nerves are intact, there are no motor or sensory deficits.  ED Treatments /  Results   Procedures Procedures  Medications Ordered in ED Medications  predniSONE (DELTASONE) tablet 60 mg (has no administration in time range)     Initial Impression / Assessment and Plan / ED Course  I have reviewed the triage vital signs and the nursing notes.  Rhinosinusitis which has failed to respond to multiple courses of antibiotics.  Old records are reviewed confirming office visits with diagnosis of rhinosinusitis and treatment with azithromycin, doxycycline, and clarithromycin.  I do not see signs of active infection currently, suspect this is now an allergic condition.  She is given a dose of prednisone and discharged with prescriptions for prednisone and fluticasone nasal spray.  She is referred to ENT for follow-up.  Final Clinical Impressions(s) / ED Diagnoses   Final diagnoses:  Rhinosinusitis    ED Discharge Orders         Ordered    predniSONE (DELTASONE) 20 MG tablet     04/27/18 0438    fluticasone (FLONASE) 50 MCG/ACT nasal spray  Daily     04/27/18 6195           Delora Fuel, MD [birth year] [phone]

## 2018-04-27 NOTE — ED Triage Notes (Signed)
Pt reports having sinus congestion, drainage, and discomfort since December. Pt has seen PCP 3 times and been on 3 different antibiotics -symptoms persist. Pt dx with rhinosinusitis at PCP office. Pt afebrile in triage.

## 2018-04-27 NOTE — Discharge Instructions (Signed)
Finish the antibiotic that you were prescribed last week.  Make an appointment with the ENT physician.

## 2018-05-06 ENCOUNTER — Ambulatory Visit: Payer: BLUE CROSS/BLUE SHIELD | Admitting: Internal Medicine

## 2018-05-06 ENCOUNTER — Telehealth: Payer: Self-pay | Admitting: Family Medicine

## 2018-05-06 ENCOUNTER — Telehealth: Payer: Self-pay | Admitting: Internal Medicine

## 2018-05-06 ENCOUNTER — Encounter: Payer: Self-pay | Admitting: Internal Medicine

## 2018-05-06 DIAGNOSIS — J329 Chronic sinusitis, unspecified: Secondary | ICD-10-CM

## 2018-05-06 NOTE — Telephone Encounter (Signed)
Patient would like ENT referral to [place] or Dr.Mc Elvis Coil in [place] for reoccurring sinus issues.  Address of ENT: [address] [place], [place], [place] [address]

## 2018-05-06 NOTE — Telephone Encounter (Signed)
PATIENT WAS A NO SHOW AND LETTER SENT  °

## 2018-05-07 NOTE — Telephone Encounter (Signed)
Let's do 

## 2018-05-07 NOTE — Telephone Encounter (Signed)
Referral ordered in Epic. 

## 2018-05-12 NOTE — Telephone Encounter (Signed)
Informational only: patient states she has appt with Dr.Vaught 05/15/18 @ 10:15am.

## 2018-05-15 DIAGNOSIS — J329 Chronic sinusitis, unspecified: Secondary | ICD-10-CM | POA: Diagnosis not present

## 2018-05-15 DIAGNOSIS — J320 Chronic maxillary sinusitis: Secondary | ICD-10-CM | POA: Diagnosis not present

## 2018-05-15 DIAGNOSIS — R05 Cough: Secondary | ICD-10-CM | POA: Diagnosis not present

## 2018-05-21 ENCOUNTER — Encounter (HOSPITAL_COMMUNITY): Payer: Self-pay | Admitting: Licensed Clinical Social Worker

## 2018-05-21 ENCOUNTER — Ambulatory Visit (HOSPITAL_COMMUNITY): Payer: BLUE CROSS/BLUE SHIELD | Admitting: Licensed Clinical Social Worker

## 2018-05-21 ENCOUNTER — Other Ambulatory Visit: Payer: Self-pay

## 2018-05-21 DIAGNOSIS — F331 Major depressive disorder, recurrent, moderate: Secondary | ICD-10-CM | POA: Diagnosis not present

## 2018-05-21 NOTE — Progress Notes (Signed)
   THERAPIST PROGRESS NOTE  Session Time: 9:00 am-9:40 am  Participation Level: Active  Behavioral Response: CasualAlertDepressed  Type of Therapy: Individual Therapy  Treatment Goals addressed: Coping  Interventions: CBT and Solution Focused  Summary: Kaitlin [name] is a 63 y.o. female who presents with  oriented x5 (person, place, situation, time, and object), alert, depressed, casually dressed, appropriately groomed, average height, overweight and cooperative for an assessment on a referral from Dr. Modesta Messing to address mood and anxiety. Patient has a history of medical treatment including hypothyroidism, hypertension and IBS. Patient has a history of mental health treatment including outpatient therapy and medication management. Patient denies symptoms of mania. She denies symptoms of suicidal and homicidal ideations. Patient denies psychosis including auditory and visual hallucinations. Patient denies substance abuse. She denies history of elopement. Patient is at low risk for lethality. Patient had bed bugs due to getting a gift from a sibling which has increased anxiety related to cleanliness and bugs. Patient also had a stressful teenage years when her mother was having an affair and her father was murdered.   Physically: Patient continues to have knee pain. She got over having the flu and a sinus infection.  Spiritually/values: Patient is spiritually healthy.  Relationships: Patient has a strained relationship with her husband. She feels like they are roommates. When she was sick, she felt like her husband didn't help her at all.  Emotionally/Mentally/Relationship: Patient's mood has been depressed. Patient is frustrated with her experience at work. 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 Patient feels like her supervisor nit picks everything that she does.   Patient engaged in session. Patient responded well to interventions. Patient  continues to meet criteria for Major depressive disorder, recurrent episode, moderate with anxious distress. Patient will continue in outpatient therapy due to being the least restrictive service to meet her needs. Patient made minimal progress on her goals.   Suicidal/Homicidal: Negativewithout intent/plan  Therapist Response: Therapist reviewed patient's recent thoughts and behaviors. Therapist utilized CBT to address mood. Therapist processed patient's feelings to identify triggers for mood. Therapist discussed patient's mood and her experience at work. Therapist updated patient's treatment plan.   Plan: Return again in 2-4 weeks.  Diagnosis: Axis I: Major depressive disorder, recurrent episode, moderate with anxious distress    Axis II: No diagnosis    Glori Bickers, LCSW 05/21/2018

## 2018-05-26 ENCOUNTER — Telehealth: Payer: Self-pay | Admitting: Family Medicine

## 2018-05-26 NOTE — Telephone Encounter (Signed)
Left message to return call 

## 2018-05-26 NOTE — Telephone Encounter (Signed)
Patient states her immune system is low and wanting to know what can she take to build it back up.[facility]

## 2018-05-26 NOTE — Telephone Encounter (Signed)
Tell demetric all of [name] her age(me included, also 10) have immune sytems that are not as good as they once were, and there is no changing that  Going forward sensib le measures, plenty of sleep (even if in parts) good nutr, maintaing exrcise, maintainign contact with loved ones etc

## 2018-05-27 NOTE — Telephone Encounter (Signed)
Patient is aware 

## 2018-06-03 ENCOUNTER — Telehealth: Payer: Self-pay | Admitting: Family Medicine

## 2018-06-03 NOTE — Telephone Encounter (Signed)
If we are going to do a fmla needs to co a virtual vist lets do tomorrow

## 2018-06-03 NOTE — Telephone Encounter (Signed)
Phone visit scheduled with Dr Richardson Landry

## 2018-06-03 NOTE — Telephone Encounter (Signed)
Patient wants to know if she needs to come out of work due to her high anxiety level with the virus going on and its making her blood pressure go up.She wants to come out on FMLA. Please advise

## 2018-06-04 ENCOUNTER — Encounter: Payer: Self-pay | Admitting: Family Medicine

## 2018-06-04 ENCOUNTER — Ambulatory Visit (INDEPENDENT_AMBULATORY_CARE_PROVIDER_SITE_OTHER): Payer: BLUE CROSS/BLUE SHIELD | Admitting: Family Medicine

## 2018-06-04 ENCOUNTER — Other Ambulatory Visit: Payer: Self-pay

## 2018-06-04 VITALS — Wt 196.0 lb

## 2018-06-04 DIAGNOSIS — I10 Essential (primary) hypertension: Secondary | ICD-10-CM

## 2018-06-04 DIAGNOSIS — F411 Generalized anxiety disorder: Secondary | ICD-10-CM

## 2018-06-04 MED ORDER — LOVASTATIN 40 MG PO TABS: ORAL_TABLET | ORAL | 1 refills | Status: DC

## 2018-06-04 MED ORDER — LEVOTHYROXINE SODIUM 50 MCG PO TABS: ORAL_TABLET | ORAL | 5 refills | Status: DC

## 2018-06-04 MED ORDER — ENALAPRIL MALEATE 20 MG PO TABS: ORAL_TABLET | ORAL | 5 refills | Status: DC

## 2018-06-04 MED ORDER — VERAPAMIL HCL ER 240 MG PO TBCR: EXTENDED_RELEASE_TABLET | ORAL | 5 refills | Status: DC

## 2018-06-04 MED ORDER — ALPRAZOLAM 1 MG PO TABS: 1.0000 mg | ORAL_TABLET | Freq: Three times a day (TID) | ORAL | 5 refills | Status: DC

## 2018-06-04 NOTE — Progress Notes (Signed)
   Subjective:    Patient ID: Kaitlin [name], female    DOB: Aug 29, 1955, 63 y.o.   MRN: [id number] Patient calls for a lengthy discussion regarding her stress and anxiety. Anxiety  Presents for initial visit. Onset was in the past 7 days.    pt states that her anxiety level has increased. Pt has a new supervisor and she feels as she is being harassed.   Working 2 days per wk  Having more anxiety  Current supervisor  Has difficult supervisor  Has had poor training  anxiety  Pt wants to only work 2 days a week.   Pt states that the virus also has her anxiety level up.   Virtual Visit via Telephone Note  I connected with LEONORA GORES on 06/04/18 at 10:30 AM EDT by telephone and verified that I am speaking with the correct person using two identifiers.   I discussed the limitations, risks, security and privacy concerns of performing an evaluation and management service by telephone and the availability of in person appointments. I also discussed with the patient that there may be a patient responsible charge related to this service. The patient expressed understanding and agreed to proceed.  Blood pressure medicine and blood pressure levels reviewed today with patient. Compliant with blood pressure medicine. States does not miss a dose. No obvious side effects. Blood pressure generally good when checked elsewhere. Watching salt intake.   Glu was 106   History of Present Illness:    Observations/Objective:   Assessment and Plan:   Follow Up Instructions:    I discussed the assessment and treatment plan with the patient. The patient was provided an opportunity to ask questions and all were answered. The patient agreed with the plan and demonstrated an understanding of the instructions.   The patient was advised to call back or seek an in-person evaluation if the symptoms worsen or if the condition fails to improve as anticipated.  I provided minutes of  non-face-to-face time during this encounter.   Vicente Males, LPN   Review of Systems     Objective:   Physical Exam   No examination due to virtual visit     Assessment & Plan:  Impression generalized anxiety.  Substantially worse.  Having challenges with her immediate supervisor.  Also admits to challenges with coronavirus.  Patient was wondering about increasing the dose of Xanax.  Not rationale discussed  2.  Hypertension.  Blood pressure generally good on current meds discussed compliance discussed  3.  Insomnia substantial discussed  Patient is having serious thoughts about stepping away from work.  Getting an FMLA.  She is can get 1 more chance tonight.  If not work out she will get back with [name].    Greater than 50% of this 25 minute face to face visit was spent in counseling and discussion and coordination of care regarding the above diagnosis/diagnosies

## 2018-06-16 ENCOUNTER — Telehealth: Payer: Self-pay | Admitting: Obstetrics & Gynecology

## 2018-06-16 MED ORDER — TERCONAZOLE 0.4 % VA CREA: 1.0000 | TOPICAL_CREAM | Freq: Every day | VAGINAL | 0 refills | Status: DC

## 2018-06-16 NOTE — Telephone Encounter (Signed)
Patient called stating that she is having a bad yeast infection because she is on 6 antibiotics. Pt states that she is going crazy and with the itching. [facility] [address]

## 2018-06-16 NOTE — Telephone Encounter (Addendum)
Spoke with pt. Pt has been on antibiotics for a sinus infection. Pt is requesting something for yeast. Having a lot of itching and burning. Please advise. Thanks!! [place]

## 2018-06-16 NOTE — Telephone Encounter (Signed)
Pt states that he pharmacy does not have the cream that was called in and she would like to see if a pill can be sent in.

## 2018-06-16 NOTE — Telephone Encounter (Signed)
Pt advised cream works better than pill after being on an antibiotic per Dr. Elonda Husky. Pharmacy has to order the cream but should have it in tomorrow. Pt aware. [place]

## 2018-06-16 NOTE — Telephone Encounter (Signed)
After an antibiotic the cream is much more successful than diflucan in treating the yeast

## 2018-06-23 ENCOUNTER — Ambulatory Visit (HOSPITAL_COMMUNITY): Payer: BLUE CROSS/BLUE SHIELD | Admitting: Licensed Clinical Social Worker

## 2018-07-21 DIAGNOSIS — J301 Allergic rhinitis due to pollen: Secondary | ICD-10-CM | POA: Diagnosis not present

## 2018-07-22 ENCOUNTER — Telehealth: Payer: Self-pay | Admitting: Family Medicine

## 2018-07-22 NOTE — Telephone Encounter (Signed)
Patient is aware of all. 

## 2018-07-22 NOTE — Telephone Encounter (Signed)
Kaitlin [name] would like to know if she should be tested for COVID-19. Someone at her job has tested positive for the virus and they have passed not being 6 ft apart and also done fist bumps. They where mask sometimes and sometimes they don't. She states she is having no symptoms but would like Dr. Mariane Duval thought on what she should do or watch for.

## 2018-07-22 NOTE — Telephone Encounter (Signed)
[place] is describing a fairly low risk situation.  There is no benefit from testing at this time.  In fact the criteria and are [place] would not allow [place] to be tested.  Whether or not she has a coworker identified with COVID-19, she has to be aware of potential symptoms such as fever cough headache sore throat muscle aches joint aches general sickness.  If these develop.  Give [name] a call.

## 2018-07-22 NOTE — Telephone Encounter (Signed)
I spoke with the patient she states she is not having any shortness of breath or cough ,nor fever. She is concerned that her co worker has been diagnosed with Covid 55. She states she has not had in long term contact with this person,but does walk past him and some times they do high fives. She would like to know if she should be concerned or be tested for covid even though she has no symptoms. Please advise.

## 2018-07-25 DIAGNOSIS — J309 Allergic rhinitis, unspecified: Secondary | ICD-10-CM | POA: Diagnosis not present

## 2018-08-08 DIAGNOSIS — J320 Chronic maxillary sinusitis: Secondary | ICD-10-CM | POA: Diagnosis not present

## 2018-08-12 ENCOUNTER — Other Ambulatory Visit (HOSPITAL_COMMUNITY): Payer: Self-pay | Admitting: Obstetrics & Gynecology

## 2018-08-12 DIAGNOSIS — Z1231 Encounter for screening mammogram for malignant neoplasm of breast: Secondary | ICD-10-CM

## 2018-08-14 ENCOUNTER — Other Ambulatory Visit: Payer: Self-pay

## 2018-08-14 ENCOUNTER — Other Ambulatory Visit (HOSPITAL_COMMUNITY): Payer: Self-pay | Admitting: Obstetrics & Gynecology

## 2018-08-14 ENCOUNTER — Ambulatory Visit (HOSPITAL_COMMUNITY)
Admission: RE | Admit: 2018-08-14 | Discharge: 2018-08-14 | Disposition: A | Discharge status: Home / Self Care | Payer: BC Managed Care – PPO | Source: Ambulatory Visit | Attending: Obstetrics & Gynecology | Admitting: Obstetrics & Gynecology

## 2018-08-14 DIAGNOSIS — Z1231 Encounter for screening mammogram for malignant neoplasm of breast: Secondary | ICD-10-CM | POA: Diagnosis not present

## 2018-08-14 DIAGNOSIS — R928 Other abnormal and inconclusive findings on diagnostic imaging of breast: Secondary | ICD-10-CM

## 2018-08-14 IMAGING — MG DIGITAL SCREENING BILATERAL MAMMOGRAM WITH TOMO AND CAD
8 series · 8 of 24 positions shown · non-contrast
Comparison: Previous exam(s).

CLINICAL DATA: Screening.

EXAM:
DIGITAL SCREENING BILATERAL MAMMOGRAM WITH TOMO AND CAD

[R CC synth-2D]
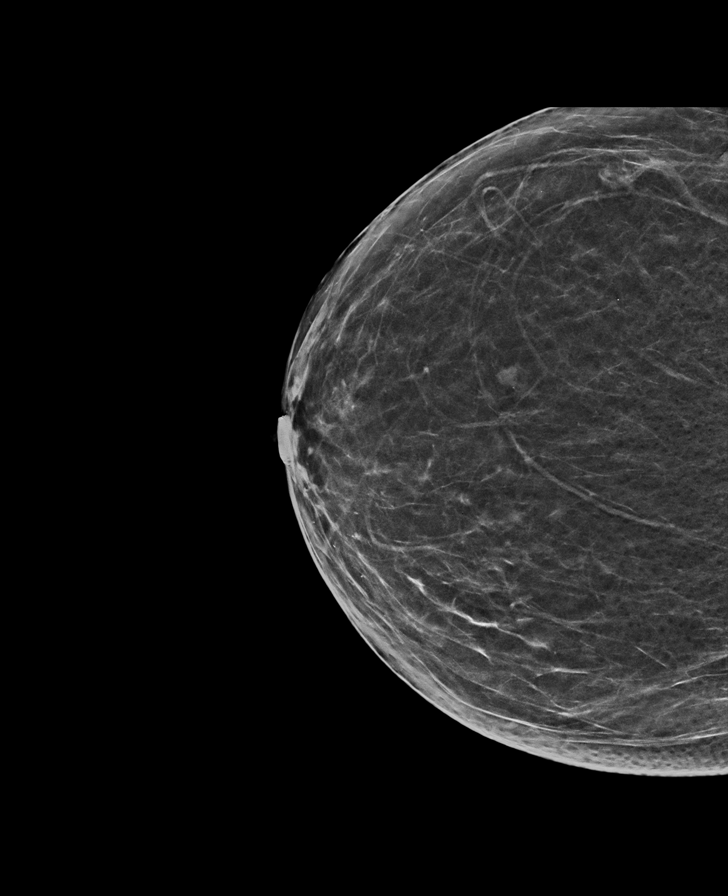

[R MLO synth-2D]
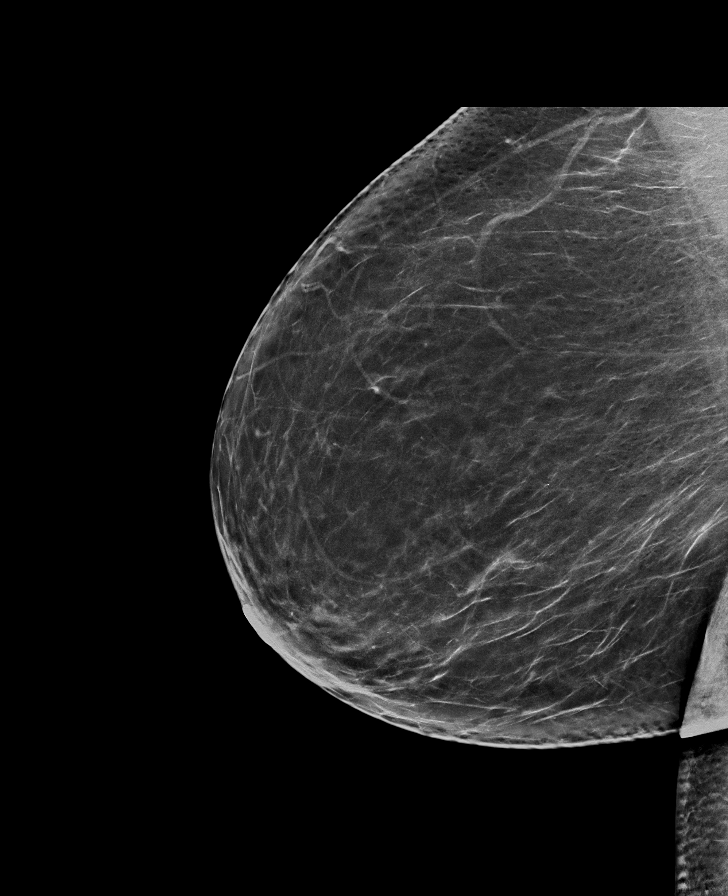

[L MLO synth-2D]
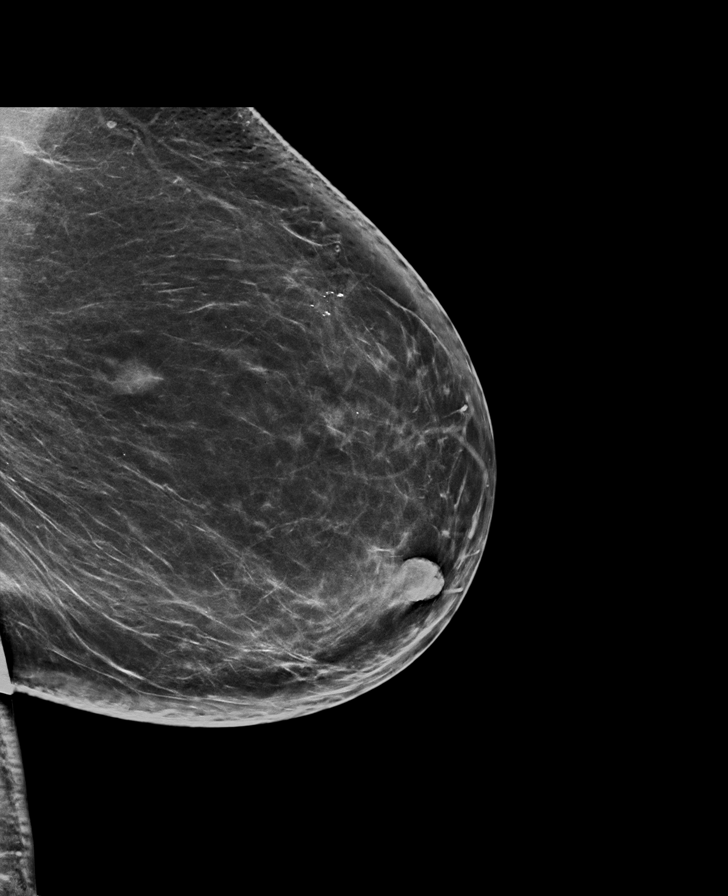

[L CC synth-2D]
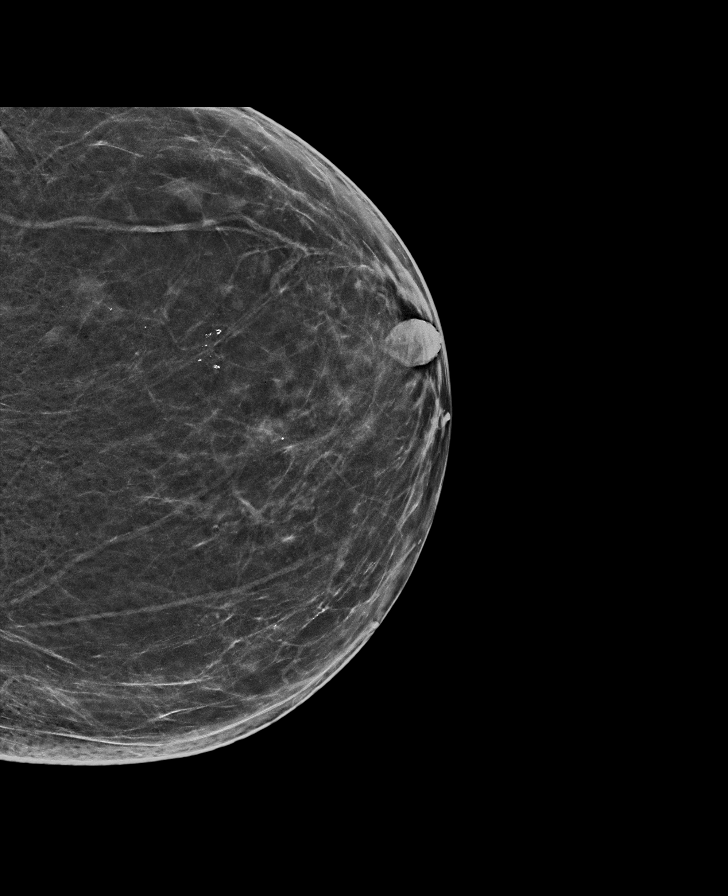

[L CC tomo · tomo slice 34/67.0]
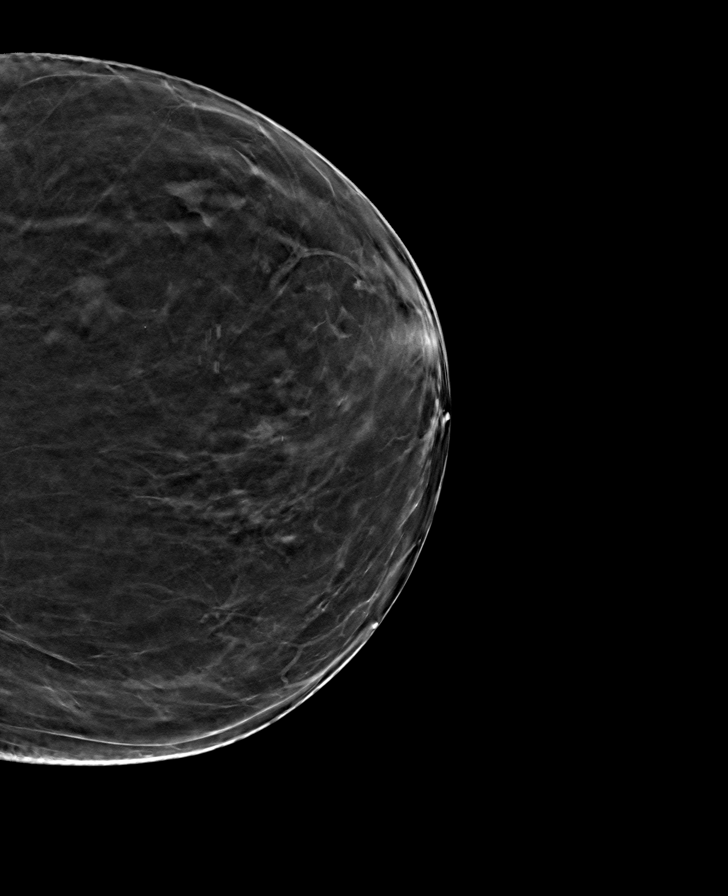

[R MLO tomo · tomo slice 41/80.0]
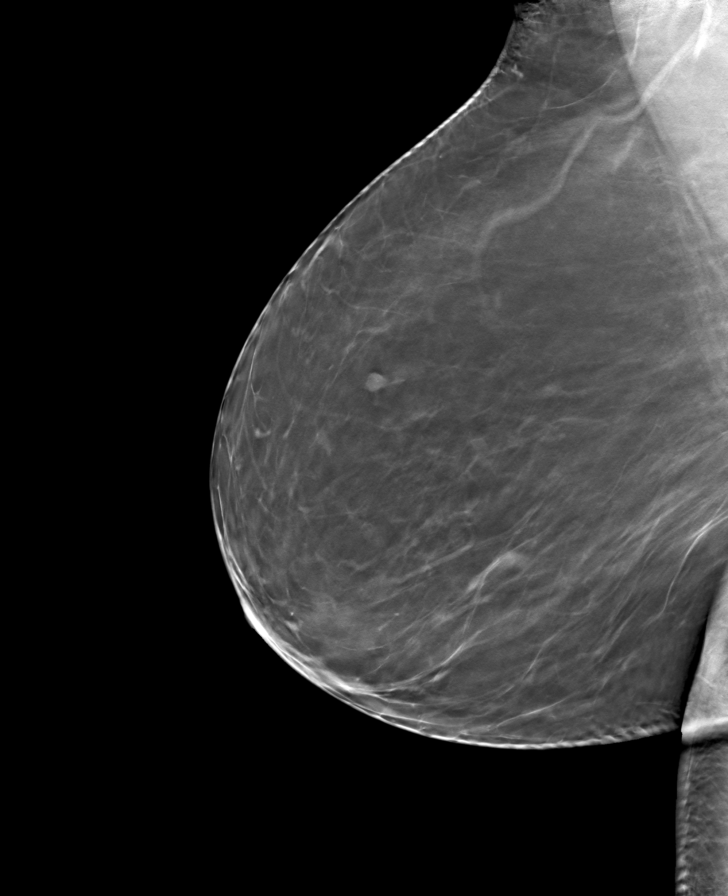

[R CC tomo · tomo slice 35/69.0]
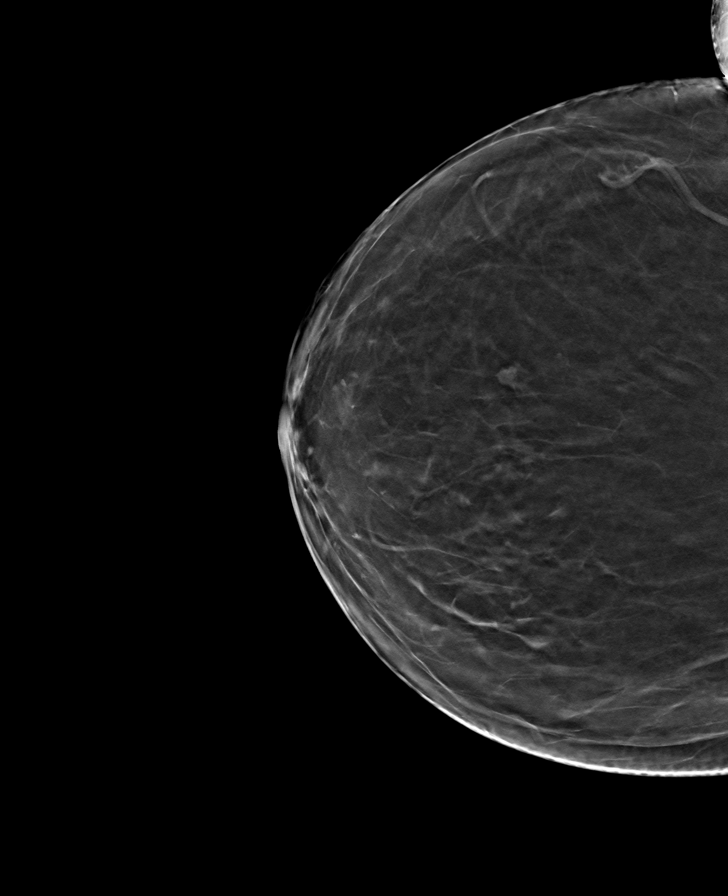

[L MLO tomo · tomo slice 43/84.0]
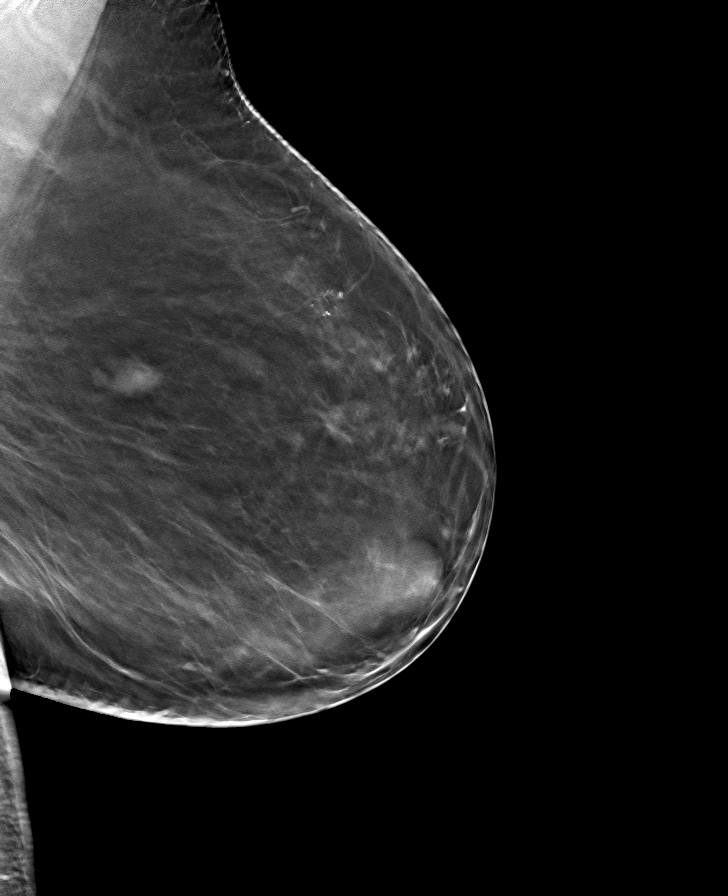

[8 of 24 positions shown; findings below may reference images not displayed]

ACR Breast Density Category b: There are scattered areas of
fibroglandular density.
FINDINGS: In the right breast, a possible mass warrants further evaluation. In
the left breast, no findings suspicious for malignancy. Images were
processed with CAD.
IMPRESSION: Further evaluation is suggested for possible mass in the right
breast.

RECOMMENDATION:
Diagnostic mammogram and possibly ultrasound of the right breast.
(Code:[MT])

The patient will be contacted regarding the findings, and additional
imaging will be scheduled.

BI-RADS CATEGORY  0: Incomplete. Need additional imaging evaluation
and/or prior mammograms for comparison.

## 2018-08-19 ENCOUNTER — Ambulatory Visit (HOSPITAL_COMMUNITY)
Admission: RE | Admit: 2018-08-19 | Discharge: 2018-08-19 | Disposition: A | Discharge status: Home / Self Care | Payer: BC Managed Care – PPO | Source: Ambulatory Visit | Attending: Obstetrics & Gynecology | Admitting: Obstetrics & Gynecology

## 2018-08-19 ENCOUNTER — Other Ambulatory Visit: Payer: Self-pay

## 2018-08-19 DIAGNOSIS — R928 Other abnormal and inconclusive findings on diagnostic imaging of breast: Secondary | ICD-10-CM | POA: Diagnosis not present

## 2018-08-19 DIAGNOSIS — N6011 Diffuse cystic mastopathy of right breast: Secondary | ICD-10-CM | POA: Diagnosis not present

## 2018-08-19 DIAGNOSIS — N6311 Unspecified lump in the right breast, upper outer quadrant: Secondary | ICD-10-CM | POA: Diagnosis not present

## 2018-08-19 IMAGING — MG DIGITAL DIAGNOSTIC UNILATERAL RIGHT MAMMOGRAM WITH TOMO AND CAD
4 series · 4 of 12 positions shown · non-contrast
Comparison: Previous exam(s).

CLINICAL DATA: Screening recall for possible right breast mass.

EXAM:
DIGITAL DIAGNOSTIC UNILATERAL RIGHT MAMMOGRAM WITH CAD AND TOMO
RIGHT BREAST ULTRASOUND

[R CC synth-2D]
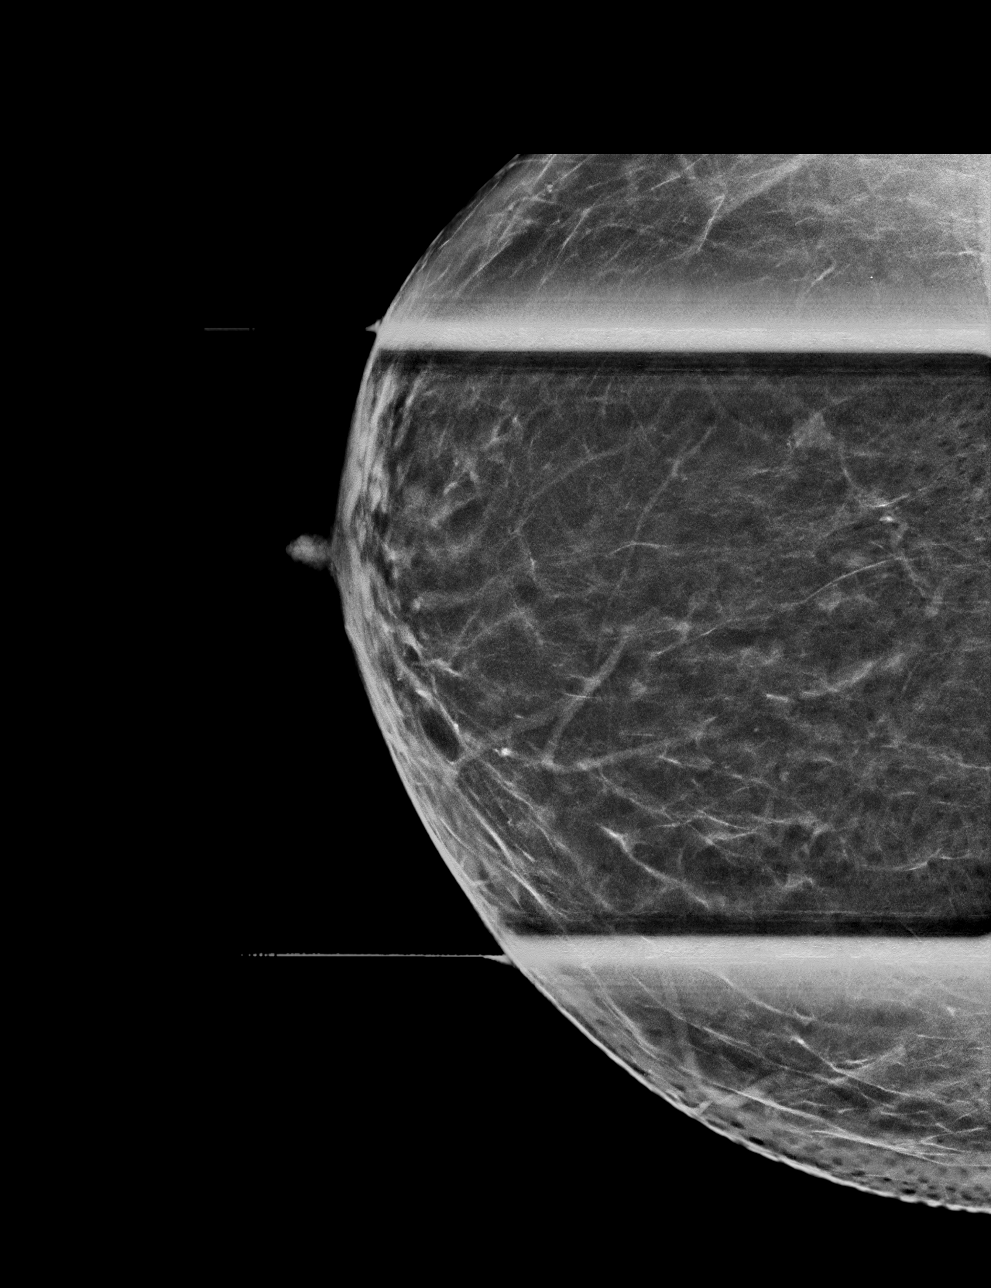

[R ML synth-2D]
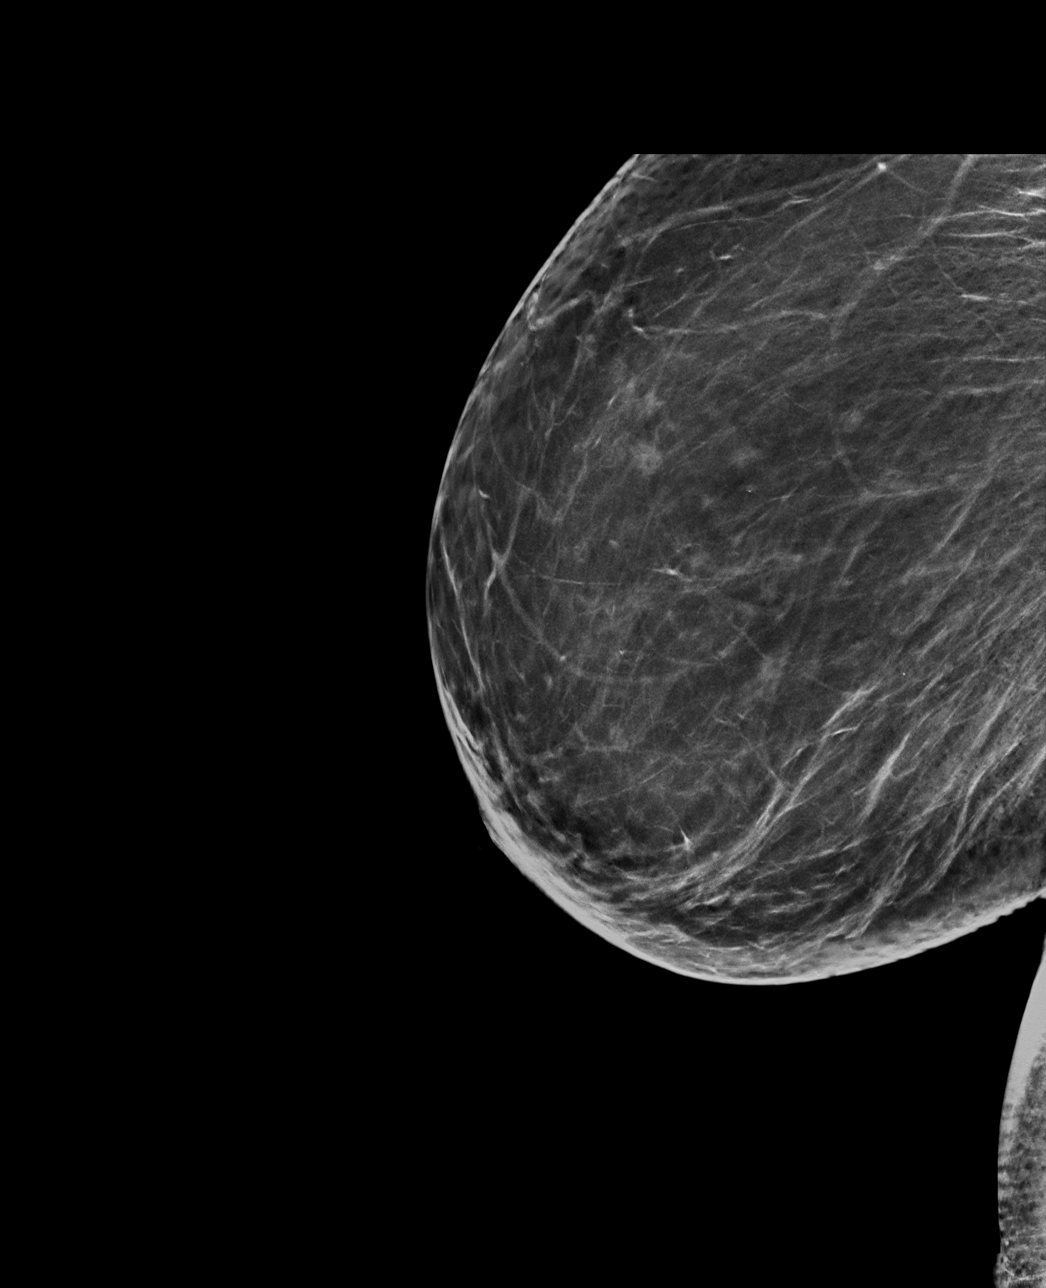

[R CC tomo · tomo slice 33/64.0]
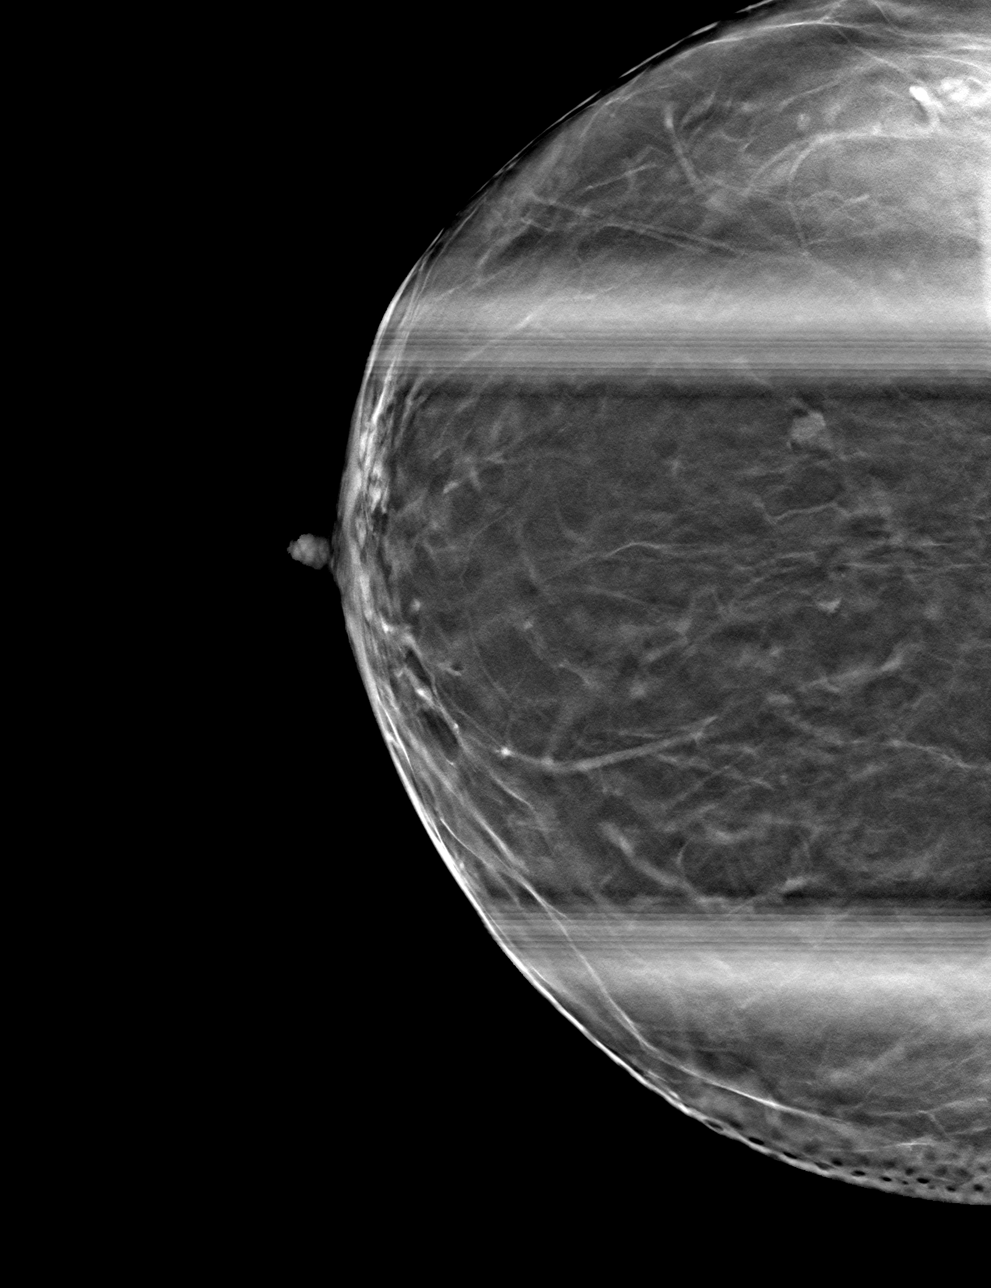

[R ML tomo · tomo slice 43/85.0]
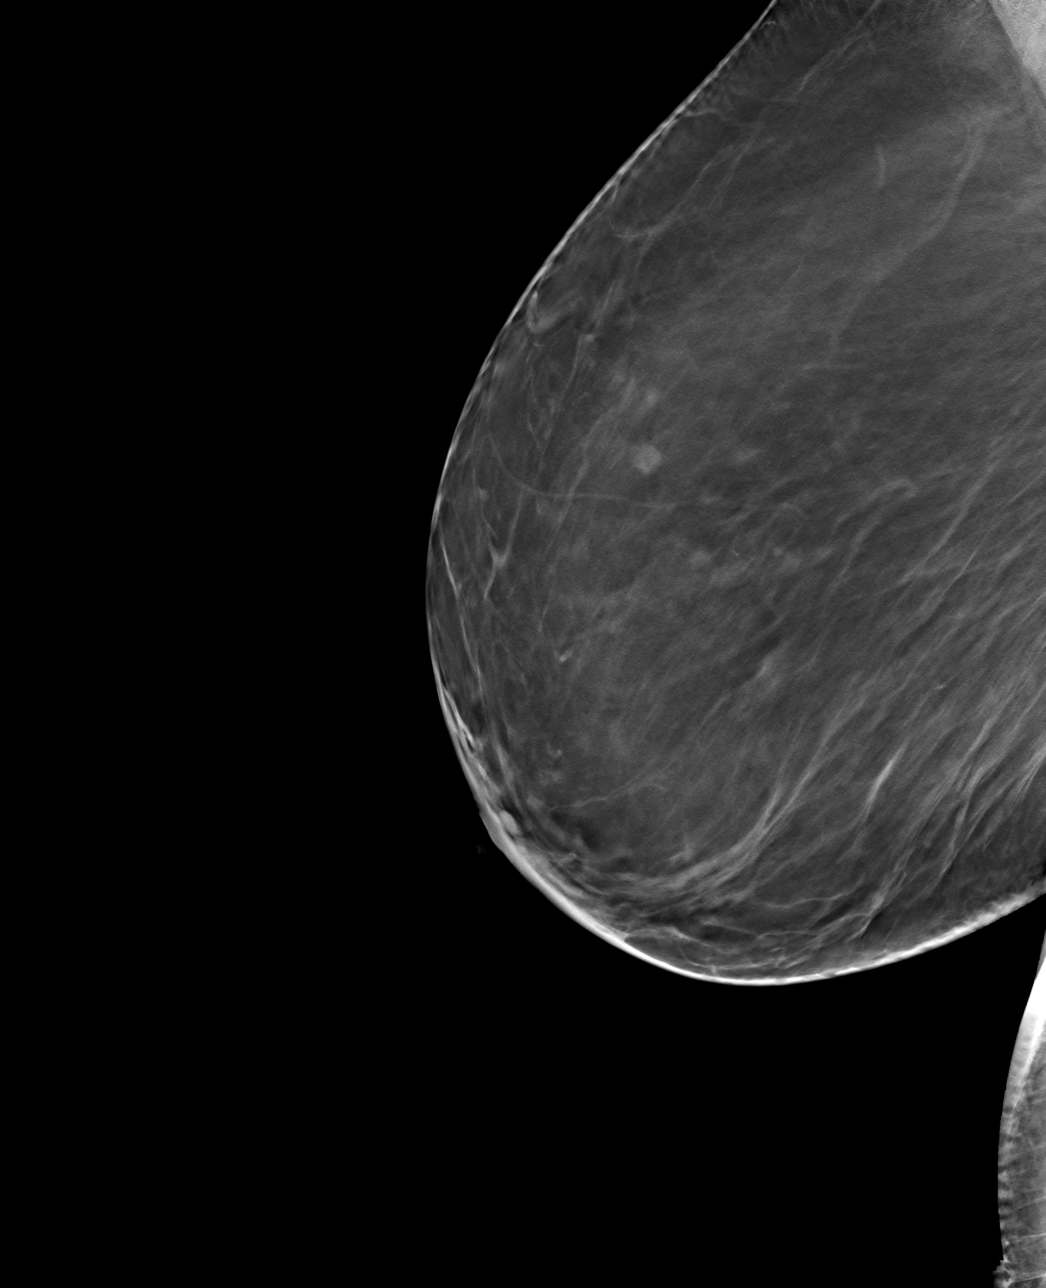

[4 of 12 positions shown; findings below may reference images not displayed]

ACR Breast Density Category b: There are scattered areas of
fibroglandular density.
FINDINGS: Additional tomograms were performed of the right breast. There is a
small persistent mass with slight margin irregularity in the upper
central to slightly upper outer right breast measuring approximately
0.5 cm.

Mammographic images were processed with CAD.

Targeted ultrasound of the upper in as well as outer right breast
was performed. There is a cluster of microcysts in the right breast
at 11 o'clock 6 cm from nipple measuring 0.5 x 0.3 x 0.5 cm. This
corresponds well with mammography findings. The entire upper and
outer right breast was scanned with no suspicious abnormality seen.
IMPRESSION: No findings of malignancy in the right breast.

RECOMMENDATION:
Screening mammogram in one year.(Code:[67])

I have discussed the findings and recommendations with the patient.
Results were also provided in writing at the conclusion of the
visit. If applicable, a reminder letter will be sent to the patient
regarding the next appointment.

BI-RADS CATEGORY  2: Benign.

## 2018-08-19 IMAGING — US ULTRASOUND RIGHT BREAST LIMITED
1 series · 5 of 5 positions shown · non-contrast
Comparison: Previous exam(s).

CLINICAL DATA: Screening recall for possible right breast mass.

EXAM:
DIGITAL DIAGNOSTIC UNILATERAL RIGHT MAMMOGRAM WITH CAD AND TOMO
RIGHT BREAST ULTRASOUND

[Series 1: ultrasound right breast limited · 0.07mm/px · 5 of 5 slices shown]
[im 1/5]
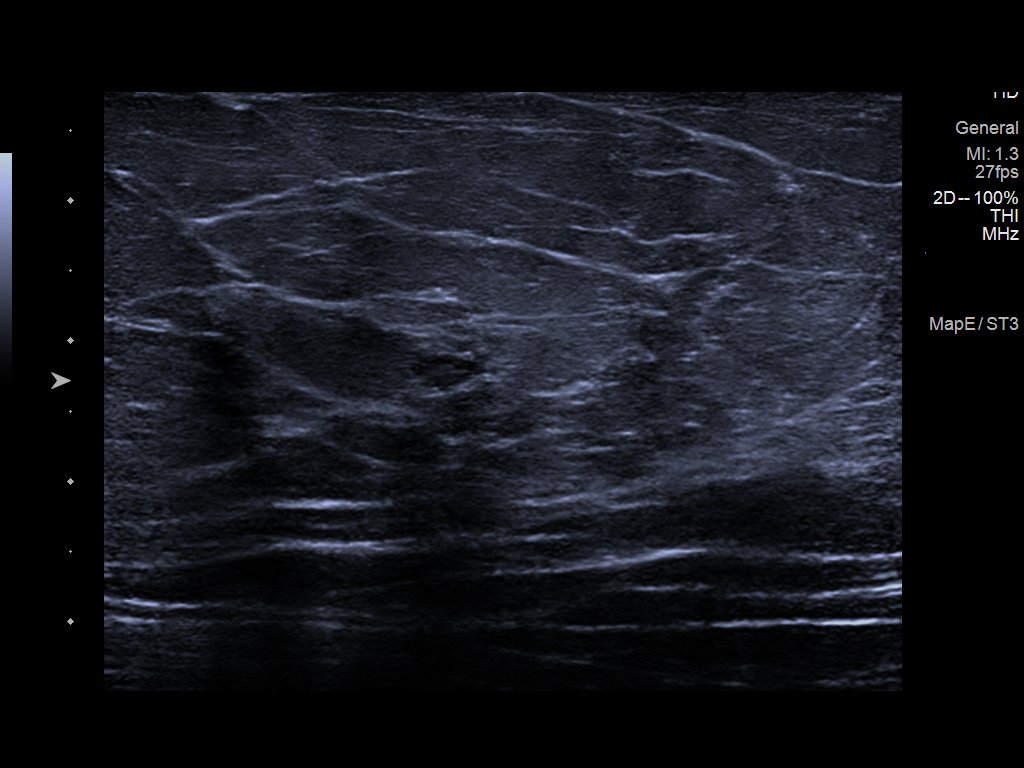
[im 2/5]
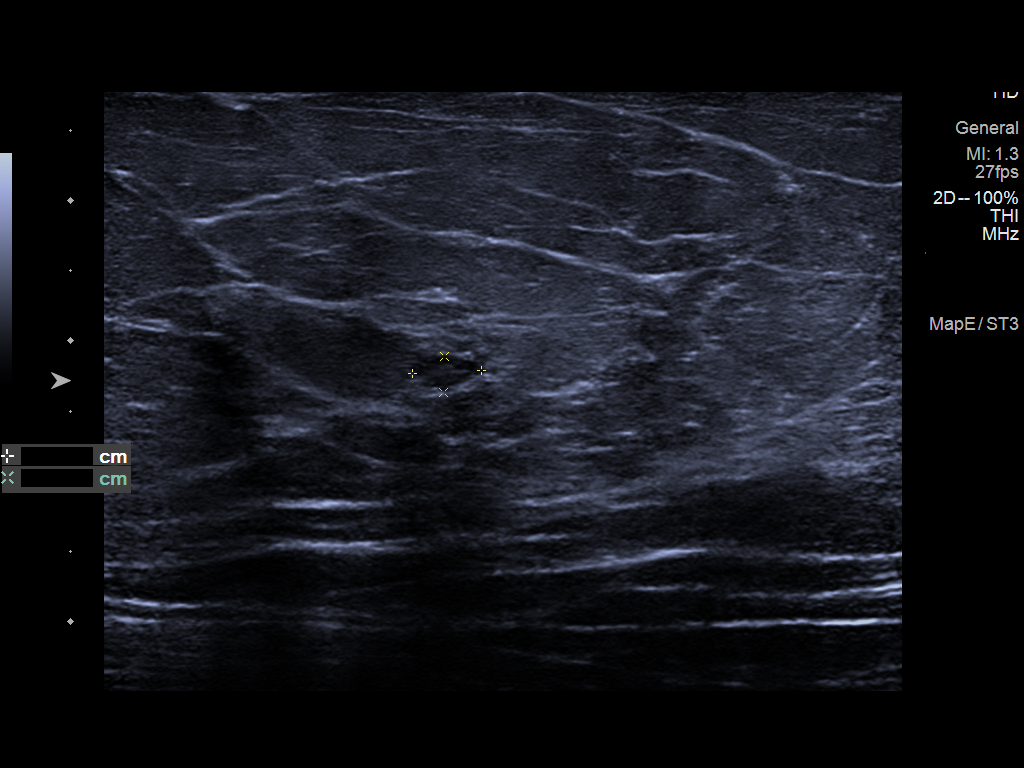
[im 3/5]
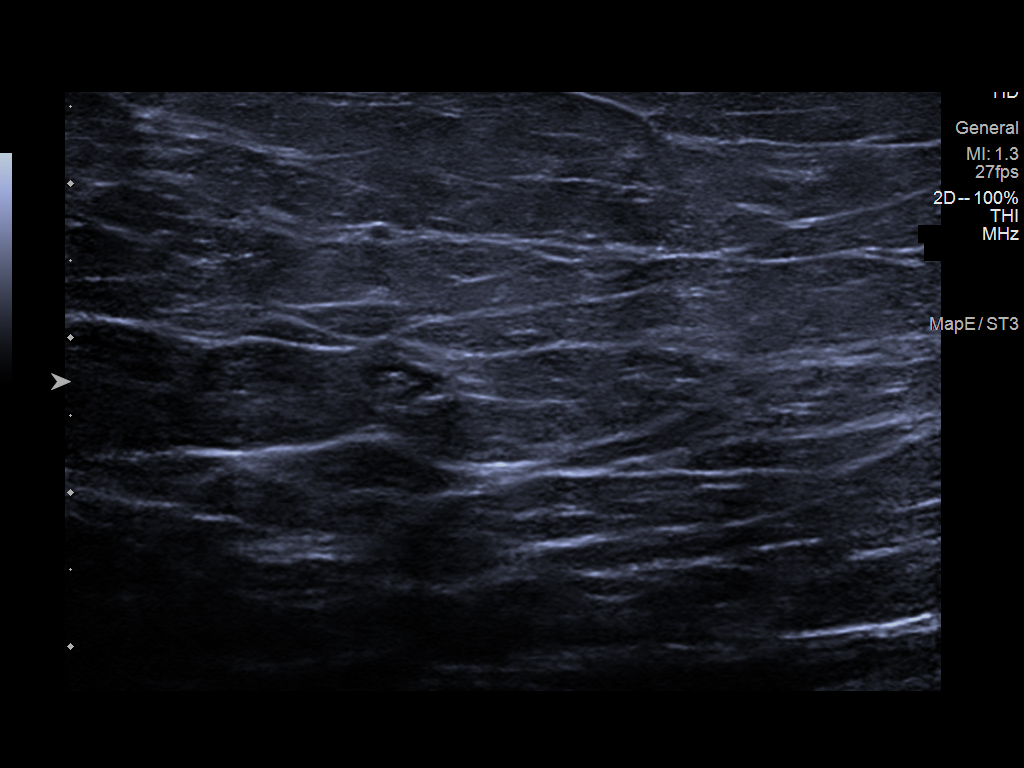
[im 4/5]
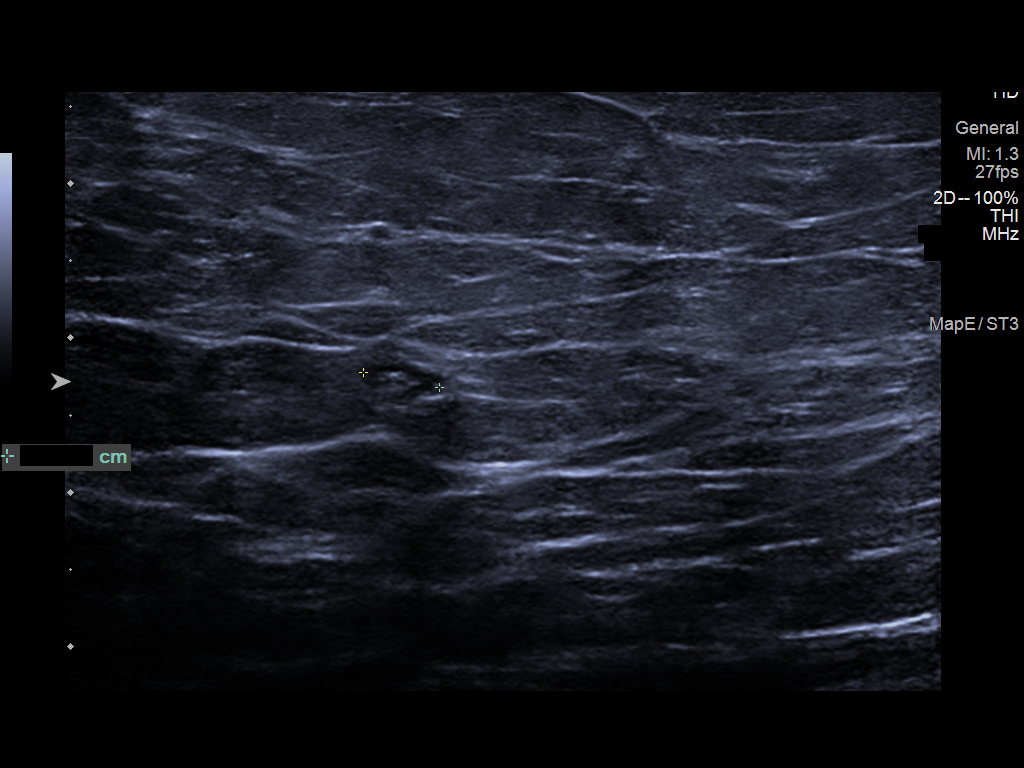
[im 5/5]
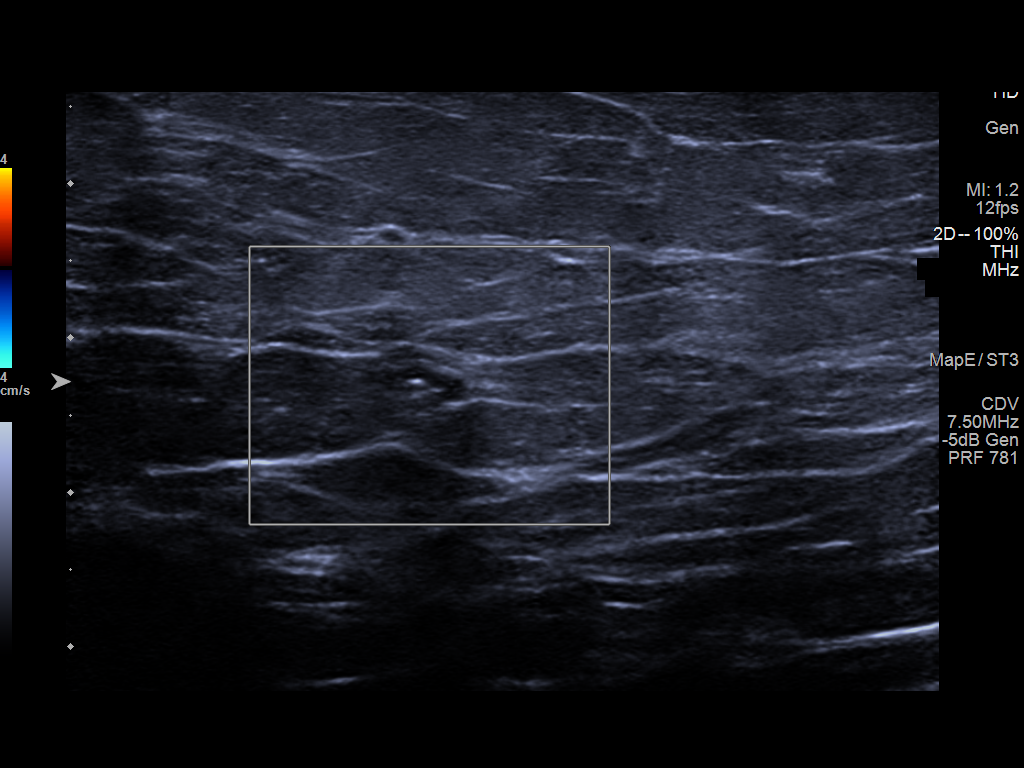

[5 of 5 positions shown; findings below may reference images not displayed]

ACR Breast Density Category b: There are scattered areas of
fibroglandular density.
FINDINGS: Additional tomograms were performed of the right breast. There is a
small persistent mass with slight margin irregularity in the upper
central to slightly upper outer right breast measuring approximately
0.5 cm.

Mammographic images were processed with CAD.

Targeted ultrasound of the upper in as well as outer right breast
was performed. There is a cluster of microcysts in the right breast
at 11 o'clock 6 cm from nipple measuring 0.5 x 0.3 x 0.5 cm. This
corresponds well with mammography findings. The entire upper and
outer right breast was scanned with no suspicious abnormality seen.
IMPRESSION: No findings of malignancy in the right breast.

RECOMMENDATION:
Screening mammogram in one year.(Code:[67])

I have discussed the findings and recommendations with the patient.
Results were also provided in writing at the conclusion of the
visit. If applicable, a reminder letter will be sent to the patient
regarding the next appointment.

BI-RADS CATEGORY  2: Benign.

## 2018-08-21 ENCOUNTER — Telehealth: Payer: Self-pay | Admitting: Family Medicine

## 2018-08-21 ENCOUNTER — Telehealth: Payer: Self-pay

## 2018-08-21 DIAGNOSIS — E349 Endocrine disorder, unspecified: Secondary | ICD-10-CM

## 2018-08-21 DIAGNOSIS — E039 Hypothyroidism, unspecified: Secondary | ICD-10-CM

## 2018-08-21 DIAGNOSIS — E785 Hyperlipidemia, unspecified: Secondary | ICD-10-CM

## 2018-08-21 DIAGNOSIS — Z79899 Other long term (current) drug therapy: Secondary | ICD-10-CM

## 2018-08-21 DIAGNOSIS — E7800 Pure hypercholesterolemia, unspecified: Secondary | ICD-10-CM

## 2018-08-21 DIAGNOSIS — I10 Essential (primary) hypertension: Secondary | ICD-10-CM

## 2018-08-21 NOTE — Telephone Encounter (Signed)
ok 

## 2018-08-21 NOTE — Telephone Encounter (Signed)
Last labs 11/13/17 lipid, liver, bmp, vit b12, pth intact and calcium

## 2018-08-21 NOTE — Telephone Encounter (Signed)
Pt called office, she is due for 5 year TCS. Does she need nurse visit or OV?

## 2018-08-21 NOTE — Telephone Encounter (Signed)
Pt did want bw orders now. Orders put in and pt notified they are ready. Pt wanted to let dr Richardson Landry know that she saw ent in [place] dr Vault and their office was trying to get in touch with our office because she had so many allergies. She wanted [name] to fax over an allergist list to them. Also wanted dr Richardson Landry to know he did ct sinus and states her skull was thicker on one side than the other side and wanted her to see neurologist and she goes to see him on Monday Dr. Tobin Chad. Copy of this message sent to medical records to send allergy list to ENT Dr. Lula Olszewski in [place]. He wants to start her on allergy shots.

## 2018-08-21 NOTE — Telephone Encounter (Signed)
Patient wants Dr. Richardson Landry to look at her previous labwork to see when she needs labs again. She thinks it's time for her to check her thyroid, liver and kidney functions.  Last test were 11-13-17

## 2018-08-21 NOTE — Telephone Encounter (Signed)
I think can wait til next visit but if wants it now, cbc met7 liv lip calcium tsh

## 2018-08-22 ENCOUNTER — Encounter: Payer: Self-pay | Admitting: Internal Medicine

## 2018-08-22 NOTE — Telephone Encounter (Signed)
SCHEDULED PATIENT FOR A NURSE VISIT AND MAILED LETTER

## 2018-08-22 NOTE — Telephone Encounter (Signed)
She is on the July recall list that I just gave to [place] and she is currently working on. She needs a nurse visit.

## 2018-08-22 NOTE — Telephone Encounter (Signed)
Noted  

## 2018-08-25 DIAGNOSIS — R413 Other amnesia: Secondary | ICD-10-CM | POA: Diagnosis not present

## 2018-08-25 DIAGNOSIS — G44211 Episodic tension-type headache, intractable: Secondary | ICD-10-CM | POA: Diagnosis not present

## 2018-08-26 ENCOUNTER — Other Ambulatory Visit: Payer: Self-pay | Admitting: Neurology

## 2018-08-26 DIAGNOSIS — R413 Other amnesia: Secondary | ICD-10-CM

## 2018-08-29 ENCOUNTER — Telehealth: Payer: Self-pay | Admitting: Family Medicine

## 2018-08-29 ENCOUNTER — Other Ambulatory Visit: Payer: Self-pay | Admitting: Family Medicine

## 2018-08-29 DIAGNOSIS — E538 Deficiency of other specified B group vitamins: Secondary | ICD-10-CM | POA: Diagnosis not present

## 2018-08-29 DIAGNOSIS — R413 Other amnesia: Secondary | ICD-10-CM | POA: Diagnosis not present

## 2018-08-29 DIAGNOSIS — E039 Hypothyroidism, unspecified: Secondary | ICD-10-CM | POA: Diagnosis not present

## 2018-08-29 DIAGNOSIS — E519 Thiamine deficiency, unspecified: Secondary | ICD-10-CM | POA: Diagnosis not present

## 2018-08-29 DIAGNOSIS — Z79899 Other long term (current) drug therapy: Secondary | ICD-10-CM | POA: Diagnosis not present

## 2018-08-29 DIAGNOSIS — E785 Hyperlipidemia, unspecified: Secondary | ICD-10-CM | POA: Diagnosis not present

## 2018-08-29 DIAGNOSIS — R51 Headache: Secondary | ICD-10-CM | POA: Diagnosis not present

## 2018-08-29 DIAGNOSIS — I10 Essential (primary) hypertension: Secondary | ICD-10-CM | POA: Diagnosis not present

## 2018-08-29 NOTE — Telephone Encounter (Signed)
FYI  Pt wanted Dr. Richardson Landry to know that the specialist is doing a brain MRI & is supposed to be faxing all information to Dr. Richardson Landry

## 2018-09-01 LAB — CBC WITH DIFFERENTIAL/PLATELET
Absolute Monocytes: 455 cells/uL (ref 200–950)
Basophils Absolute: 59 cells/uL (ref 0–200)
Basophils Relative: 0.9 %
Eosinophils Absolute: 132 cells/uL (ref 15–500)
Eosinophils Relative: 2 %
HCT: 40.9 % (ref 35.0–45.0)
Hemoglobin: 13.6 g/dL (ref 11.7–15.5)
Lymphs Abs: 2330 cells/uL (ref 850–3900)
MCH: 28 pg (ref 27.0–33.0)
MCHC: 33.3 g/dL (ref 32.0–36.0)
MCV: 84.3 fL (ref 80.0–100.0)
MPV: 10.2 fL (ref 7.5–12.5)
Monocytes Relative: 6.9 %
Neutro Abs: 3623 cells/uL (ref 1500–7800)
Neutrophils Relative %: 54.9 %
Platelets: 283 10*3/uL (ref 140–400)
RBC: 4.85 10*6/uL (ref 3.80–5.10)
RDW: 12.5 % (ref 11.0–15.0)
Total Lymphocyte: 35.3 %
WBC: 6.6 10*3/uL (ref 3.8–10.8)

## 2018-09-01 LAB — HEPATIC FUNCTION PANEL
AG Ratio: 1.7 (calc) (ref 1.0–2.5)
ALT: 32 U/L — ABNORMAL HIGH (ref 6–29)
AST: 28 U/L (ref 10–35)
Albumin: 4.3 g/dL (ref 3.6–5.1)
Alkaline phosphatase (APISO): 73 U/L (ref 37–153)
Bilirubin, Direct: 0.1 mg/dL (ref 0.0–0.2)
Globulin: 2.5 g/dL (calc) (ref 1.9–3.7)
Indirect Bilirubin: 0.4 mg/dL (calc) (ref 0.2–1.2)
Total Bilirubin: 0.5 mg/dL (ref 0.2–1.2)
Total Protein: 6.8 g/dL (ref 6.1–8.1)

## 2018-09-01 LAB — PTH, INTACT AND CALCIUM
Calcium: 10.8 mg/dL — ABNORMAL HIGH (ref 8.6–10.4)
PTH: 52 pg/mL (ref 14–64)

## 2018-09-01 LAB — BASIC METABOLIC PANEL WITH GFR
BUN: 13 mg/dL (ref 7–25)
CO2: 24 mmol/L (ref 20–32)
Calcium: 10.8 mg/dL — ABNORMAL HIGH (ref 8.6–10.4)
Chloride: 106 mmol/L (ref 98–110)
Creat: 0.66 mg/dL (ref 0.50–0.99)
GFR, Est African American: 110 mL/min/{1.73_m2} (ref >=60)
GFR, Est Non African American: 95 mL/min/{1.73_m2} (ref >=60)
Glucose, Bld: 110 mg/dL — ABNORMAL HIGH (ref 65–99)
Potassium: 4.4 mmol/L (ref 3.5–5.3)
Sodium: 140 mmol/L (ref 135–146)

## 2018-09-01 LAB — LIPID PANEL
Cholesterol: 158 mg/dL (ref <200)
HDL: 45 mg/dL — ABNORMAL LOW (ref >=50)
LDL Cholesterol (Calc): 94 mg/dL (calc)
Non-HDL Cholesterol (Calc): 113 mg/dL (calc) (ref <130)
Total CHOL/HDL Ratio: 3.5 (calc) (ref <5.0)
Triglycerides: 93 mg/dL (ref <150)

## 2018-09-01 LAB — TSH: TSH: 2.13 mIU/L (ref 0.40–4.50)

## 2018-09-08 ENCOUNTER — Ambulatory Visit
Admission: RE | Admit: 2018-09-08 | Discharge: 2018-09-08 | Disposition: A | Discharge status: Home / Self Care | Payer: BC Managed Care – PPO | Source: Ambulatory Visit | Attending: Neurology | Admitting: Neurology

## 2018-09-08 ENCOUNTER — Other Ambulatory Visit: Payer: Self-pay

## 2018-09-08 DIAGNOSIS — R413 Other amnesia: Secondary | ICD-10-CM | POA: Diagnosis not present

## 2018-09-08 DIAGNOSIS — R51 Headache: Secondary | ICD-10-CM | POA: Diagnosis not present

## 2018-09-08 IMAGING — MR MRI HEAD WITHOUT AND WITH CONTRAST
14 series · 46 of 48 positions shown · IV contrast (Gadavist)
Comparison: CT head without contrast [DATE]

CLINICAL DATA: Memory loss and headaches beginning 2 months ago.

EXAM:
MRI HEAD WITHOUT AND WITH CONTRAST
TECHNIQUE: Multiplanar, multiecho pulse sequences of the brain and surrounding
structures were obtained without and with intravenous contrast.
CONTRAST:  8 mL Gadavist

[Series 5: ax dwi_tracew · axial · 3.0mm · 0.60mm/px · z∈[-119,+41]mm · 4 of 55 slices shown]
[im 1/55]
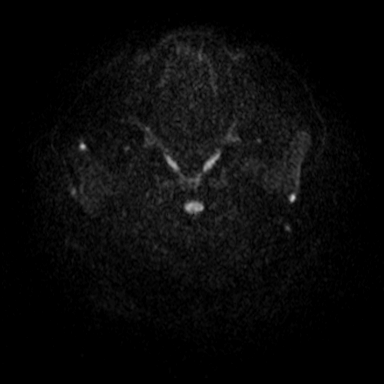
[im 19/55]
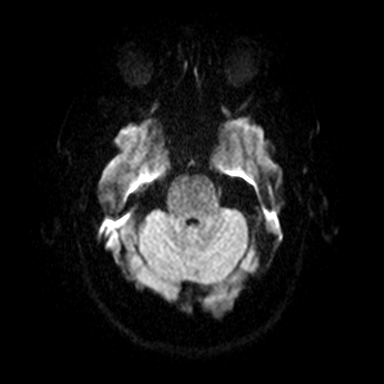
[im 37/55]
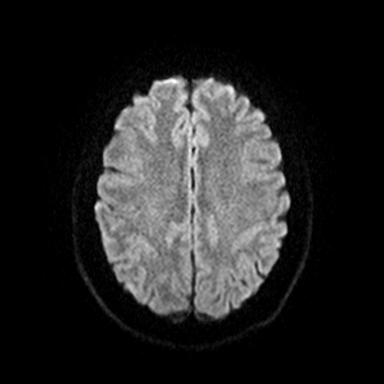
[im 55/55]
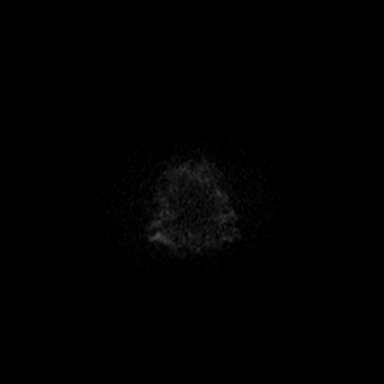

[Series 6: ax dwi_adc · axial · 3.0mm · 0.60mm/px · z∈[-119,+41]mm · 4 of 55 slices shown]
[im 1/55]
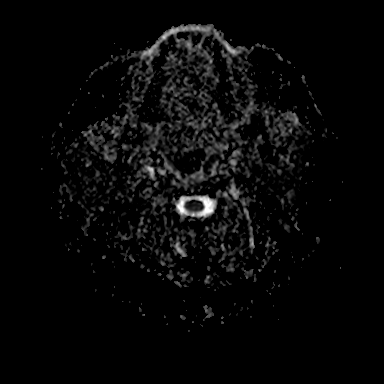
[im 19/55]
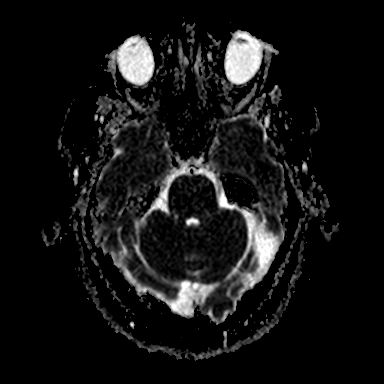
[im 37/55]
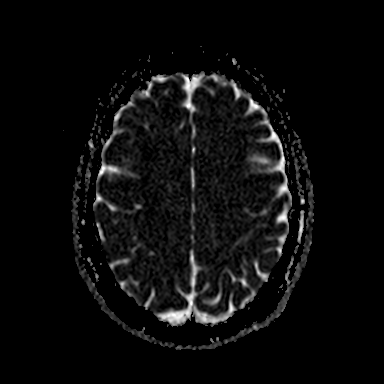
[im 55/55]
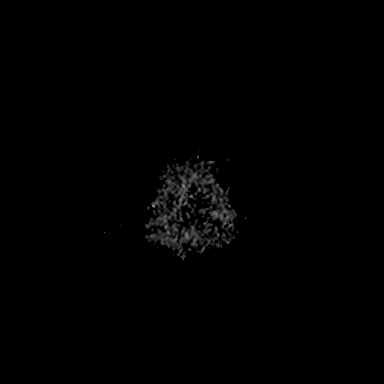

[Series 7: cor dwi_tracew · coronal · 5.0mm · 0.60mm/px · 2 of 45 slices shown]
[im 1/45]
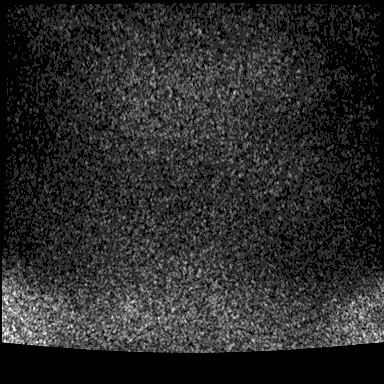
[im 45/45]
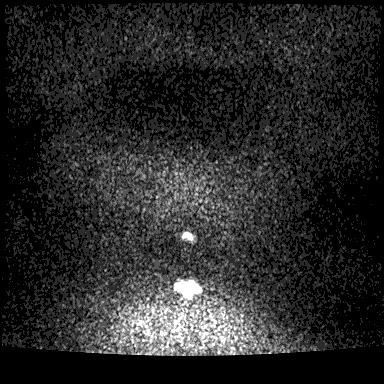

[Series 8: cor dwi_adc · coronal · 5.0mm · 0.60mm/px · 2 of 43 slices shown]
[im 1/43]
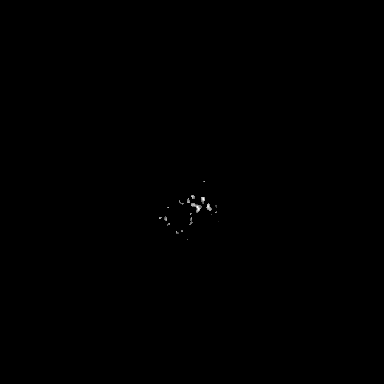
[im 43/43]
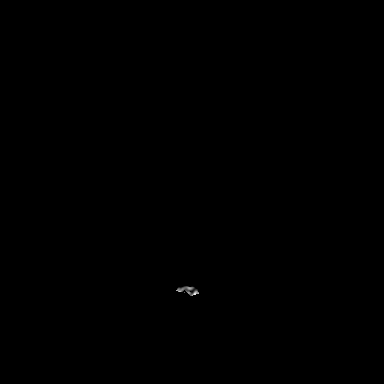

[Series 9: T1 · sagittal · 5.0mm · 0.62mm/px · 1 of 25 slices shown (1 of 2)]
[im 1/25]
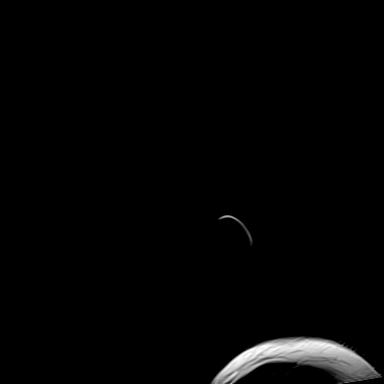

[Series 10: T2 · axial · 5.0mm · 0.53mm/px · 1 of 25 slices shown]
[im 1/25]
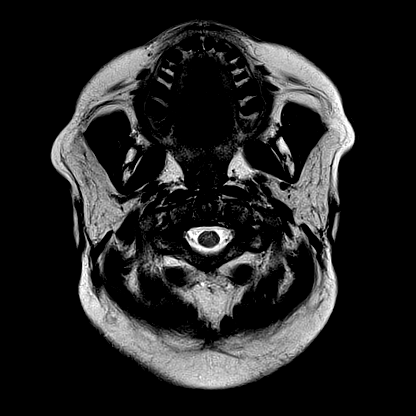

[Series 11: mag_images · axial · 3.0mm · 0.90mm/px · z∈[-125,+50]mm · 3 of 60 slices shown]
[im 1/60]
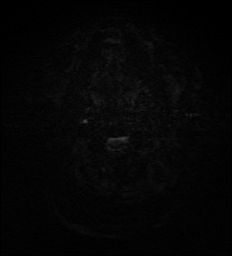
[im 30/60]
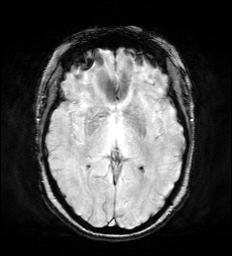
[im 60/60]
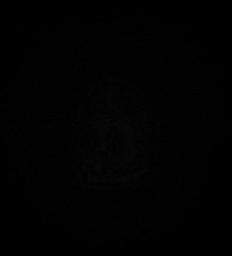

[Series 12: pha_images · axial · 3.0mm · 0.90mm/px · z∈[-125,+50]mm · 3 of 60 slices shown]
[im 1/60]
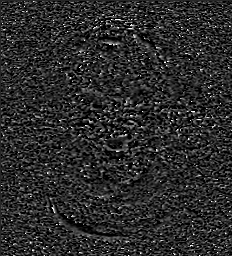
[im 30/60]
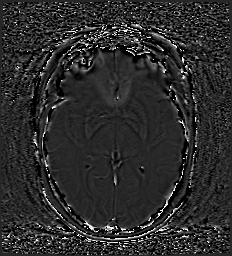
[im 60/60]
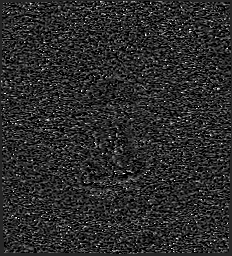

[Series 13: swi_images · axial · 3.0mm · 0.90mm/px · z∈[-125,-39]mm · 2 of 60 slices shown]
[im 1/60]
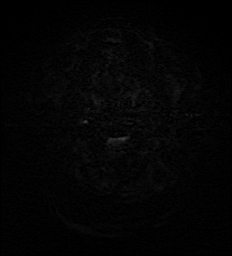
[im 30/60]
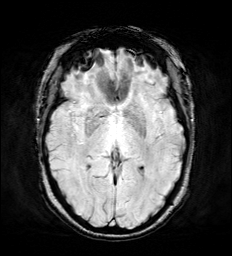

[Series 15: FLAIR · axial · 3.0mm · 0.53mm/px · z∈[-119,+42]mm · 3 of 55 slices shown]
[im 1/55]
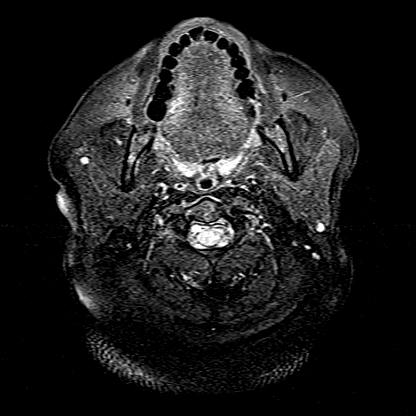
[im 28/55]
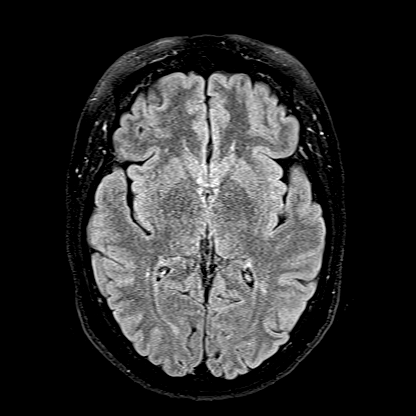
[im 55/55]
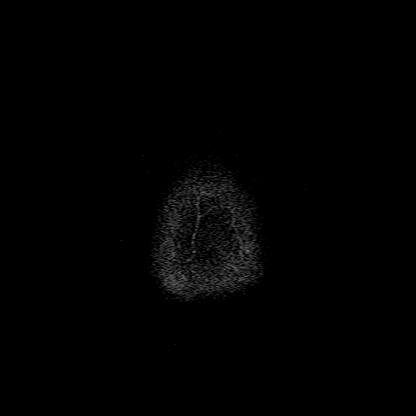

[Series 16: T1 · axial · 1.0mm · 0.98mm/px · z∈[-128,+45]mm · 8 of 176 slices shown (2 of 2)]
[im 1/176]
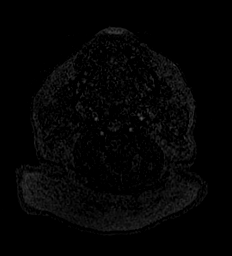
[im 22/176]
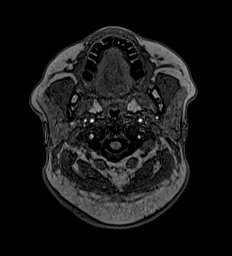
[im 44/176]
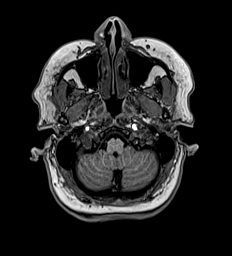
[im 66/176]
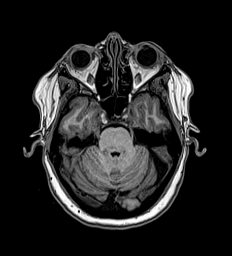
[im 110/176]
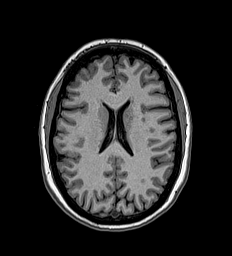
[im 132/176]
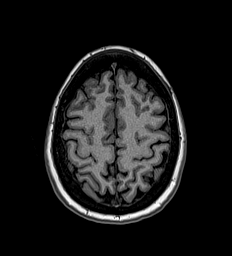
[im 154/176]
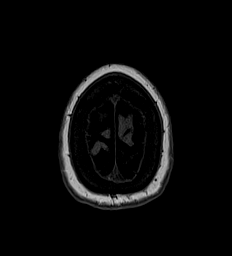
[im 176/176]
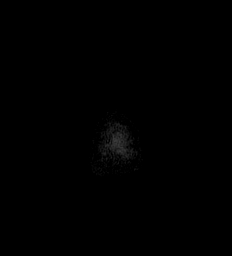

[Series 17: T2 post-contrast · coronal · 5.0mm · 0.57mm/px · 2 of 29 slices shown]
[im 1/29]
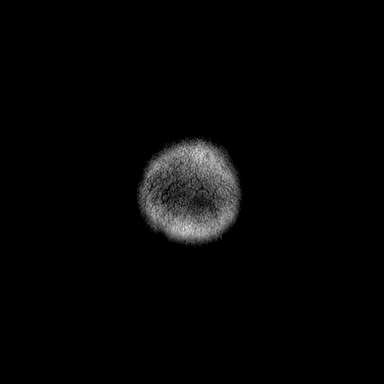
[im 29/29]
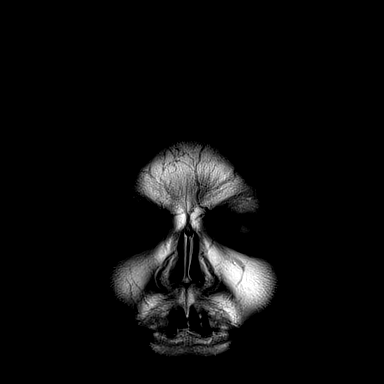

[Series 18: T1 post-contrast · axial · 1.0mm · 0.98mm/px · z∈[-128,+45]mm · 9 of 176 slices shown (1 of 2)]
[im 1/176]
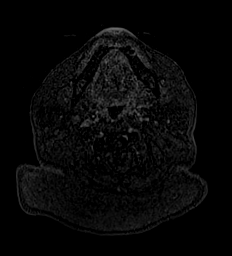
[im 22/176]
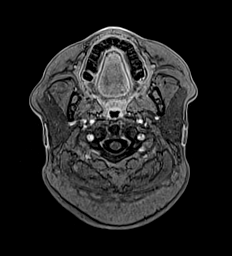
[im 44/176]
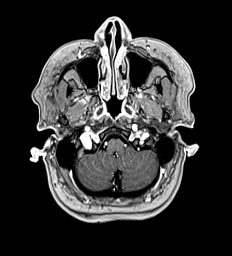
[im 66/176]
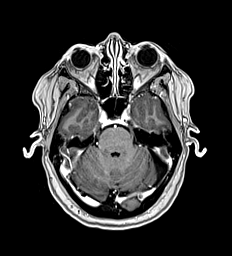
[im 88/176]
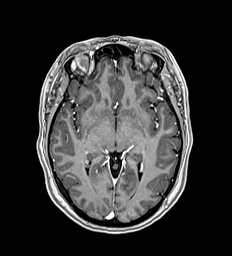
[im 110/176]
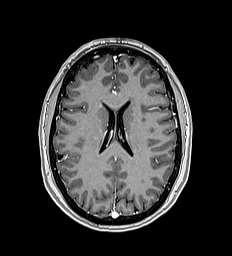
[im 132/176]
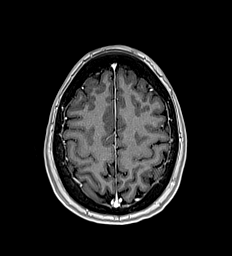
[im 154/176]
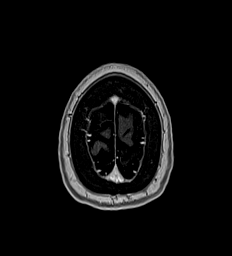
[im 176/176]
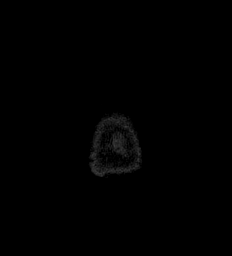

[Series 19: T1 post-contrast · coronal · 5.0mm · 0.57mm/px · 2 of 29 slices shown (2 of 2)]
[im 1/29]
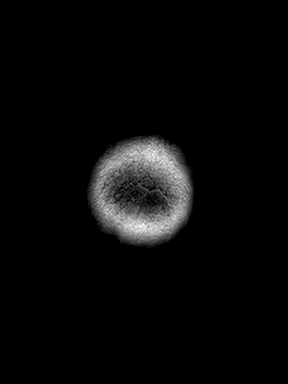
[im 29/29]
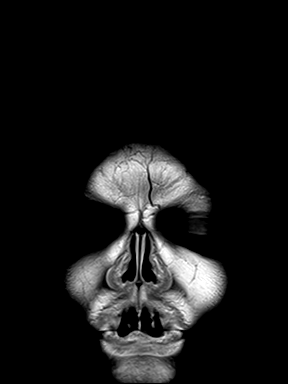

[46 of 48 positions shown; findings below may reference images not displayed]

FINDINGS: Brain: Scattered subcortical T2 hyperintensities are present in the
frontal lobes bilaterally. No acute infarct, hemorrhage, or mass
lesion is present. The ventricles are of normal size. No significant
extraaxial fluid collection is present.

Postcontrast images demonstrate no pathologic enhancement. There is
no enhancement associated with the subcortical T2 hyperintensities
bilaterally.

The internal auditory canals are within normal limits. The brainstem
and cerebellum are within normal limits.

Vascular: Flow is present in the major intracranial arteries.

Skull and upper cervical spine: The craniocervical junction is
normal. Upper cervical spine is within normal limits. Marrow signal
is unremarkable.

Sinuses/Orbits: The paranasal sinuses and mastoid air cells are
clear. The globes and orbits are within normal limits.
IMPRESSION: 1. Scattered subcortical T2 hyperintensities in the anterior frontal
lobes bilaterally are mildly advanced for age. The finding is
nonspecific but can be seen in the setting of chronic microvascular
ischemia, a demyelinating process such as multiple sclerosis,
vasculitis, complicated migraine headaches, or as the sequelae of a
prior infectious or inflammatory process.
2. Other acute or focal abnormality to explain the patient's memory
loss or headaches.

## 2018-09-08 MED ORDER — GADOBUTROL 1 MMOL/ML IV SOLN
8.0000 mL | Freq: Once | INTRAVENOUS | Status: AC | PRN
  Administered 2018-09-08: 8 mL via INTRAVENOUS

## 2018-09-12 ENCOUNTER — Telehealth: Payer: Self-pay | Admitting: Family Medicine

## 2018-09-12 DIAGNOSIS — R2689 Other abnormalities of gait and mobility: Secondary | ICD-10-CM | POA: Diagnosis not present

## 2018-09-12 DIAGNOSIS — G501 Atypical facial pain: Secondary | ICD-10-CM | POA: Diagnosis not present

## 2018-09-12 DIAGNOSIS — F419 Anxiety disorder, unspecified: Secondary | ICD-10-CM | POA: Diagnosis not present

## 2018-09-12 DIAGNOSIS — E538 Deficiency of other specified B group vitamins: Secondary | ICD-10-CM | POA: Diagnosis not present

## 2018-09-12 DIAGNOSIS — R413 Other amnesia: Secondary | ICD-10-CM | POA: Diagnosis not present

## 2018-09-12 NOTE — Telephone Encounter (Signed)
Very slight elevation of liver enzyme does not mean a thing, we will keep an eye on it with reg b w. thx for neuro update, I trust they know what they are doing

## 2018-09-12 NOTE — Telephone Encounter (Signed)
Pt contacted and verbalized understanding.  

## 2018-09-12 NOTE — Telephone Encounter (Signed)
Pt had questions regarding her ALT level. ALT level came back as 32 and she is wondering why lab was flagged. Pt also states that she would like Dr.Steve to be able to know what is going at neurologist. Pt is seeing Dr.Shah at [facility]. Pt had MRI done and they found some white spots on brain; neurologist told pt that that was nothing to worry about. Neuro wants to put her on Aspirin 81mg ; pt states aspirin makes her nervous. Pt states the neurologist also is putting her on Oxycarbazepine 150 mg BID for 2 weeks then increase to 300 mg BID. Please advise. Thank you

## 2018-09-12 NOTE — Telephone Encounter (Signed)
Pt received letter in mail regarding her lab work and has some questions for the nurse.

## 2018-09-24 ENCOUNTER — Ambulatory Visit: Payer: BLUE CROSS/BLUE SHIELD | Admitting: Medical

## 2018-09-24 ENCOUNTER — Other Ambulatory Visit: Payer: Self-pay

## 2018-09-24 DIAGNOSIS — Z026 Encounter for examination for insurance purposes: Secondary | ICD-10-CM

## 2018-09-29 NOTE — Progress Notes (Signed)
E[facility] 

## 2018-10-06 ENCOUNTER — Other Ambulatory Visit: Payer: Self-pay

## 2018-10-06 DIAGNOSIS — K219 Gastro-esophageal reflux disease without esophagitis: Secondary | ICD-10-CM

## 2018-10-06 DIAGNOSIS — K589 Irritable bowel syndrome without diarrhea: Secondary | ICD-10-CM

## 2018-10-06 DIAGNOSIS — R634 Abnormal weight loss: Secondary | ICD-10-CM

## 2018-10-09 MED ORDER — PANTOPRAZOLE SODIUM 40 MG PO TBEC: 40.0000 mg | DELAYED_RELEASE_TABLET | Freq: Two times a day (BID) | ORAL | 3 refills | Status: DC

## 2018-10-14 ENCOUNTER — Other Ambulatory Visit: Payer: Self-pay

## 2018-10-14 ENCOUNTER — Ambulatory Visit (INDEPENDENT_AMBULATORY_CARE_PROVIDER_SITE_OTHER): Payer: BC Managed Care – PPO | Admitting: Family Medicine

## 2018-10-14 DIAGNOSIS — H6501 Acute serous otitis media, right ear: Secondary | ICD-10-CM | POA: Diagnosis not present

## 2018-10-14 MED ORDER — CEFDINIR 300 MG PO CAPS: 300.0000 mg | ORAL_CAPSULE | Freq: Two times a day (BID) | ORAL | 0 refills | Status: DC

## 2018-10-14 NOTE — Progress Notes (Signed)
   Subjective:  audio  Patient ID: Kaitlin [name], female    DOB: [birth year], 63 y.o.   MRN: [id number]  Otalgia  There is pain in the right ear. This is a new problem. Episode onset: last night. Associated symptoms comments: Chills, hoarseness,. She has tried acetaminophen for the symptoms.   Needs a letter for her work to wear a face shield instead of a mask because the mask caused blisters on her mouth.   Virtual Visit via Telephone Note  I connected with Kaitlin [name] on 10/14/18 at 11:00 AM EDT by telephone and verified that I am speaking with the correct person using two identifiers.  Location: Patient: home Provider: office   I discussed the limitations, risks, security and privacy concerns of performing an evaluation and management service by telephone and the availability of in person appointments. I also discussed with the patient that there may be a patient responsible charge related to this service. The patient expressed understanding and agreed to proceed.   History of Present Illness:    Observations/Objective:   Assessment and Plan:   Follow Up Instructions:    I discussed the assessment and treatment plan with the patient. The patient was provided an opportunity to ask questions and all were answered. The patient agreed with the plan and demonstrated an understanding of the instructions.   The patient was advised to call back or seek an in-person evaluation if the symptoms worsen or if the condition fails to improve as anticipated.  I provided 20 minutes of non-face-to-face time during this encounter.  Ear pain deep ache at times.  Some upper airway congestion.  Slight hoarseness.  No cough no shortness of breath no fever no sore throat  Also notes some pain on it side with chewing tender worse with opening mouth wide open    Review of Systems  HENT: Positive for ear pain.        Objective:   Physical Exam  Virtual      Assessment & Plan:   Impression right otitis media versus TMJ.  Encouraged to switch from Tylenol to Aleve 2 tablets twice daily avoid heavy chewing.  Will call in oral antibiotics symptom care discussed warning signs discussed

## 2018-10-18 ENCOUNTER — Encounter: Payer: Self-pay | Admitting: Family Medicine

## 2018-10-20 ENCOUNTER — Telehealth: Payer: Self-pay | Admitting: Family Medicine

## 2018-10-20 NOTE — Telephone Encounter (Signed)
Pt is calling stating in regards to being on antibiotic for ear pain and working for [employer] they are requiring her get another COVID test done. She had one done two months ago. She has to go in this week and do another test. Pt states she cant keep dealing with the stress of it and may need to come in and see Dr. Richardson Landry for a leave from work. 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 She states she may need to go out on disability if this continues and dealing with the random testing.

## 2018-10-20 NOTE — Telephone Encounter (Signed)
Pt has virtual tomorrow afternoon

## 2018-10-20 NOTE — Telephone Encounter (Signed)
Ok, if needs this set up appt and we will disc

## 2018-10-20 NOTE — Telephone Encounter (Signed)
lvm for pt to call and schedule virtual visit

## 2018-10-21 ENCOUNTER — Ambulatory Visit (INDEPENDENT_AMBULATORY_CARE_PROVIDER_SITE_OTHER): Payer: BC Managed Care – PPO | Admitting: Family Medicine

## 2018-10-21 ENCOUNTER — Other Ambulatory Visit: Payer: Self-pay

## 2018-10-21 DIAGNOSIS — I10 Essential (primary) hypertension: Secondary | ICD-10-CM

## 2018-10-21 DIAGNOSIS — E7800 Pure hypercholesterolemia, unspecified: Secondary | ICD-10-CM

## 2018-10-21 DIAGNOSIS — F411 Generalized anxiety disorder: Secondary | ICD-10-CM

## 2018-10-21 DIAGNOSIS — E349 Endocrine disorder, unspecified: Secondary | ICD-10-CM | POA: Diagnosis not present

## 2018-10-21 DIAGNOSIS — R7989 Other specified abnormal findings of blood chemistry: Secondary | ICD-10-CM

## 2018-10-21 NOTE — Progress Notes (Signed)
   Subjective:  Audio   Patient ID: Kaitlin [name], female    DOB: 06/08/1955, 63 y.o.   MRN: [id number]  HPI Patient calls to discuss ongoing anxiety. Patient states her employer is doing random Covid testing and she got picked and had to have the testing done and it completely stressed her out and thinks she may have to come out of work because her neurologist told her she should have no stress at this point.  Virtual Visit via Video Note  I connected with Kaitlin [name] on 10/21/18 at  1:40 PM EDT by a video enabled telemedicine application and verified that I am speaking with the correct person using two identifiers.  Location: Patient: home Provider: office   I discussed the limitations of evaluation and management by telemedicine and the availability of in person appointments. The patient expressed understanding and agreed to proceed.  History of Present Illness:    Observations/Objective:   Assessment and Plan:   Follow Up Instructions:    I discussed the assessment and treatment plan with the patient. The patient was provided an opportunity to ask questions and all were answered. The patient agreed with the plan and demonstrated an understanding of the instructions.   The patient was advised to call back or seek an in-person evaluation if the symptoms worsen or if the condition fails to improve as anticipated.  I provided 25 minutes of non-face-to-face time during this encounter.  Patient calls for protracted discussion.  She is having more more difficulties with anxiety and stress.  This stems from her job.  She works at [employer].  Her job involves potential exposure to students.  She currently does not trust students to use good judgment and maintain COVID-19 free status.  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  Patient also has numerous medical problems which increase her risk of hospitalization or death were she to  encounter this disease.  Patient has been seen a neurologist.  This for her challenging headaches and other neurological symptomatology.  Currently on medication from them.  They have also advised her she needs to cut down her stress or her neurological problems will worsen.     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  Also high risk from a medical standpoint.  I have advised her to contact human resources.  I am going to write her off work both from a stress standpoint and/or from a high risk medical standpoint.  Patient to explore her options and get back with [name].  Numerous questions answered.  Long discussion held.  Greater than 50% of this 25 minute face to face visit was spent in counseling and discussion and coordination of care regarding the above diagnosis/diagnosies

## 2018-10-30 ENCOUNTER — Other Ambulatory Visit: Payer: Self-pay

## 2018-10-30 ENCOUNTER — Ambulatory Visit (INDEPENDENT_AMBULATORY_CARE_PROVIDER_SITE_OTHER): Payer: BC Managed Care – PPO | Admitting: *Deleted

## 2018-10-30 DIAGNOSIS — Z86010 Personal history of colonic polyps: Secondary | ICD-10-CM

## 2018-10-30 MED ORDER — PEG 3350-KCL-NA BICARB-NACL 420 G PO SOLR: 4000.0000 mL | Freq: Once | ORAL | 0 refills | Status: AC

## 2018-10-30 NOTE — Patient Instructions (Signed)
Kaitlin [name]   07/08/1955 MRN: [id number]    Procedure Date: 12/26/2018 Time to register: 9:30 am Place to register: [facility] Procedure Time: 10:30 am Scheduled provider: Dr. Gala Romney  PREPARATION FOR COLONOSCOPY WITH TRI-LYTE SPLIT PREP  Please notify us immediately if you are diabetic, take iron supplements, or if you are on Coumadin or any other blood thinners.   You will need to purchase 1 fleet enema and 1 box of Bisacodyl '5mg'$  tablets.   2 DAYS BEFORE PROCEDURE:  DATE: 12/24/2018 DAY: Wednesday Begin clear liquid diet AFTER your lunch meal. NO SOLID FOODS after this point.  1 DAY BEFORE PROCEDURE:  DATE: 12/25/2018   DAY: Thursday Continue clear liquids the entire day - NO SOLID FOOD.    At 2:00 pm:  Take 2 Bisacodyl tablets.   At 4:00pm:  Start drinking your solution. Make sure you mix well per instructions on the bottle. Try to drink 1 (one) 8 ounce glass every 10-15 minutes until you have consumed HALF the jug. You should complete by 6:00pm.You must keep the left over solution refrigerated until completed next day.  Continue clear liquids. You must drink plenty of clear liquids to prevent dehyration and kidney failure.     DAY OF PROCEDURE:   DATE: 12/26/2018   DAY: Friday If you take medications for your heart, blood pressure or breathing, you may take these medications.   Five hours before your procedure time @ 5:30 am:  Finish remaining amout of bowel prep, drinking 1 (one) 8 ounce glass every 10-15 minutes until complete. You have two hours to consume remaining prep.   Three hours before your procedure time @ 7:30 am:  Nothing by mouth.   At least one hour before going to the hospital:  Give yourself one Fleet enema. You may take your morning medications with sip of water unless we have instructed otherwise.      Please see below for Dietary Information.  CLEAR LIQUIDS INCLUDE:  Water Jello (NOT red in color)   Ice Popsicles (NOT red in  color)   Tea (sugar ok, no milk/cream) Powdered fruit flavored drinks  Coffee (sugar ok, no milk/cream) Gatorade/ Lemonade/ Kool-Aid  (NOT red in color)   Juice: apple, white grape, white cranberry Soft drinks  Clear bullion, consomme, broth (fat free beef/chicken/vegetable)  Carbonated beverages (any kind)  Strained chicken noodle soup Hard Candy   Remember: Clear liquids are liquids that will allow you to see your fingers on the other side of a clear glass. Be sure liquids are NOT red in color, and not cloudy, but CLEAR.  DO NOT EAT OR DRINK ANY OF THE FOLLOWING:  Dairy products of any kind   Cranberry juice Tomato juice / V8 juice   Grapefruit juice Orange juice     Red grape juice  Do not eat any solid foods, including such foods as: cereal, oatmeal, yogurt, fruits, vegetables, creamed soups, eggs, bread, crackers, pureed foods in a blender, etc.   HELPFUL HINTS FOR DRINKING PREP SOLUTION:   Make sure prep is extremely cold. Mix and refrigerate the the morning of the prep. You may also put in the freezer.   You may try mixing some Crystal Light or Country Time Lemonade if you prefer. Mix in small amounts; add more if necessary.  Try drinking through a straw  Rinse mouth with water or a mouthwash between glasses, to remove after-taste.  Try sipping on a cold beverage /ice/ popsicles between glasses of prep.  Place a piece of sugar-free hard candy in mouth between glasses.  If you become nauseated, try consuming smaller amounts, or stretch out the time between glasses. Stop for 30-60 minutes, then slowly start back drinking.        OTHER INSTRUCTIONS  You will need a responsible adult at least 62 years of age to accompany you and drive you home. This person must remain in the waiting room during your procedure. The hospital will cancel your procedure if you do not have a responsible adult with you.   1. Wear loose fitting clothing that is easily removed. 2. Leave jewelry  and other valuables at home.  3. Remove all body piercing jewelry and leave at home. 4. Total time from sign-in until discharge is approximately 2-3 hours. 5. You should go home directly after your procedure and rest. You can resume normal activities the day after your procedure. 6. The day of your procedure you should not:  Drive  Make legal decisions  Operate machinery  Drink alcohol  Return to work   You may call the office (Dept: [phone]) before 5:00pm, or page the doctor on call [phone]) after 5:00pm, for further instructions, if necessary.   Insurance Information YOU WILL NEED TO CHECK WITH YOUR INSURANCE COMPANY FOR THE BENEFITS OF COVERAGE YOU HAVE FOR THIS PROCEDURE.  UNFORTUNATELY, NOT ALL INSURANCE COMPANIES HAVE BENEFITS TO COVER ALL OR PART OF THESE TYPES OF PROCEDURES.  IT IS YOUR RESPONSIBILITY TO CHECK YOUR BENEFITS, HOWEVER, WE WILL BE GLAD TO ASSIST YOU WITH ANY CODES YOUR INSURANCE COMPANY MAY NEED.    PLEASE NOTE THAT MOST INSURANCE COMPANIES WILL NOT COVER A SCREENING COLONOSCOPY FOR PEOPLE UNDER THE AGE OF 50  IF YOU HAVE BCBS INSURANCE, YOU MAY HAVE BENEFITS FOR A SCREENING COLONOSCOPY BUT IF POLYPS ARE FOUND THE DIAGNOSIS WILL CHANGE AND THEN YOU MAY HAVE A DEDUCTIBLE THAT WILL NEED TO BE MET. SO PLEASE MAKE SURE YOU CHECK YOUR BENEFITS FOR A SCREENING COLONOSCOPY AS WELL AS A DIAGNOSTIC COLONOSCOPY.

## 2018-10-30 NOTE — Progress Notes (Signed)
Gastroenterology Pre-Procedure Review  Request Date: 10/30/2018 Requesting Physician: 5 year recall, Last TCS 09/18/13 done by Dr. Gala Romney, hyperplastic polyps  PATIENT REVIEW QUESTIONS: The patient responded to the following health history questions as indicated:    1. Diabetes Melitis: No 2. Joint replacements in the past 12 months: No 3. Major health problems in the past 3 months: No 4. Has an artificial valve or MVP: No 5. Has a defibrillator: No 6. Has been advised in past to take antibiotics in advance of a procedure like teeth cleaning: No 7. Family history of colon cancer: No  8. Alcohol Use: Yes, 2 glasses of wine a week 9. History of sleep apnea: No  10. History of coronary artery or other vascular stents placed within the last 12 months: No 11. History of any prior anesthesia complications: No    MEDICATIONS & ALLERGIES:    Patient reports the following regarding taking any blood thinners:   Plavix? No Aspirin? Yes Coumadin? No Brilinta? No Xarelto? No Eliquis? No Pradaxa? No Savaysa? No Effient? No  Patient confirms/reports the following medications:  Current Outpatient Medications  Medication Sig Dispense Refill  . ALPRAZolam (XANAX) 1 MG tablet Take 1 tablet (1 mg total) by mouth 3 (three) times daily. (Patient taking differently: Take 1 mg by mouth as needed. ) 90 tablet 5  . aspirin EC 81 MG tablet Take 81 mg by mouth daily.    . blood glucose meter kit and supplies Dispense based on patient and insurance preference. Test once a day E11.9 1 each 0  . cefdinir (OMNICEF) 300 MG capsule Take 1 capsule (300 mg total) by mouth 2 (two) times daily. 20 capsule 0  . enalapril (VASOTEC) 20 MG tablet TAKE 1 TABLET (20 MG TOTAL) BY MOUTH AT BEDTIME. 30 tablet 5  . furosemide (LASIX) 20 MG tablet Take 1 tablet (20 mg total) by mouth daily. (Patient taking differently: Take 10 mg by mouth daily. ) 30 tablet 5  . glucose blood test strip OneTouch Ultra Blue Test Strip  USE TO  TEST ONCE DAILY    . levothyroxine (SYNTHROID, LEVOTHROID) 50 MCG tablet TAKE 1 TABLET BY MOUTH EVERY DAY 30 tablet 5  . lovastatin (MEVACOR) 40 MG tablet TAKE 1 TABLET BY MOUTH EVERYDAY AT BEDTIME 90 tablet 1  . OVER THE COUNTER MEDICATION Vit B1 one tablet daily magnessium one daily    . Oxcarbazepine (TRILEPTAL) 300 MG tablet TAKE 150 MG (HALF PILL) TWO TIMES A DAY FOR 2 WEEKS THEN INCREASE TO 300 MG TWO TIMES A DAY    . pantoprazole (PROTONIX) 40 MG tablet Take 1 tablet (40 mg total) by mouth 2 (two) times daily before a meal. (Patient taking differently: Take 40 mg by mouth daily. ) 180 tablet 3  . verapamil (CALAN-SR) 240 MG CR tablet Take one tablet by mouth at bedtime (Patient taking differently: daily. ) 30 tablet 5  . mometasone (ELOCON) 0.1 % cream Apply to affected area twice daily as needed for itching (Patient not taking: Reported on 10/30/2018) 60 g 0   No current facility-administered medications for this visit.     Patient confirms/reports the following allergies:  Allergies  Allergen Reactions  . Augmentin [Amoxicillin-Pot Clavulanate] Diarrhea  . Levaquin [Levofloxacin In D5w] Other (See Comments)    chestpain  . Rocephin [Ceftriaxone Sodium In Dextrose] Hives and Other (See Comments)    blisters  . Valtrex [Valacyclovir Hcl] Hives and Other (See Comments)  . Zoloft [Sertraline Hcl] Other (See Comments)  Too strong    No orders of the defined types were placed in this encounter.   AUTHORIZATION INFORMATION Primary Insurance: [place],  [place] #: [id number],  Group #: [id number] Pre-Cert / Josem Kaufmann required: No, not required  SCHEDULE INFORMATION: Procedure has been scheduled as follows:  Date: 12/26/2018, Time: 10:30  Location: [facility] with Dr. Gala Romney  This Gastroenterology Pre-Precedure Review Form is being routed to the following provider(s): Neil Crouch, PA-C

## 2018-10-31 NOTE — Progress Notes (Signed)
Based on the amount of Versed/Demerol she has used in past for conscious sedation and current medications (Xanax, oxcarbazepine), she needs TCS with propofol. Please make OV.

## 2018-10-31 NOTE — Addendum Note (Signed)
Addended by: Mahala Menghini on: 10/31/2018 01:43 PM   Modules accepted: Orders

## 2018-11-04 ENCOUNTER — Encounter: Payer: Self-pay | Admitting: *Deleted

## 2018-11-04 NOTE — Progress Notes (Signed)
SCHEDULED

## 2018-11-04 NOTE — Progress Notes (Signed)
Mailed letter to pt with appointment information and procedure cancellation.   

## 2018-11-19 ENCOUNTER — Ambulatory Visit: Payer: BC Managed Care – PPO

## 2018-11-19 ENCOUNTER — Other Ambulatory Visit: Payer: Self-pay

## 2018-11-19 DIAGNOSIS — Z23 Encounter for immunization: Secondary | ICD-10-CM

## 2018-12-03 ENCOUNTER — Other Ambulatory Visit: Payer: Self-pay | Admitting: Family Medicine

## 2018-12-07 ENCOUNTER — Other Ambulatory Visit: Payer: Self-pay | Admitting: Family Medicine

## 2018-12-10 NOTE — Progress Notes (Signed)
Referring Provider: [name], MD Primary Care Physician:  [name], MD Primary GI Physician: Dr. Gala Romney  Chief Complaint  Patient presents with  . Colonoscopy  . Gas    HPI:   Kaitlin [name] is a 63 y.o. female presenting with GI history significant for GERD, IBS constipation type, and colon polyps with tubulovillous adenoma in 2012. Last colonoscopy on 09/18/13 with two diminutive polyps in the mid sigmoid segment. Pathology with hyperplastic polyps. Recommended repeat in 2020. She presents today to schedule her 5 year surveillance colonoscopy. Last seen in our office on 02/11/2018 for left-sided abdominal pain, GERD, and constipation.  GERD symptoms were well controlled on Protonix 40 mg twice daily.  She complained of nonprogressive left-sided abdominal pain x 1 year that was not associated with eating or bowel movements.  Was having a bowel movement daily to every 3 days.  Had failed Linzess and MiraLAX in the past.  Advised to continue Protonix twice daily.  She has upcoming OB/GYN appointment.  Abdominal pain was not plated to GYN source, would likely proceed with CT scanning.   OB/GYN assessment on 02/17/2018 stated LUQ pain seemed to be abdominal wall pain, not intra-abdominal.  Does not appear that patient ever contacted our office back to obtain CT scan.  She no showed for her appointment in March.  Today she states she thinks she has a stomach virus last week. Was having BMs 3 times a day that were loose. BMs are back to baseline now.  No sick contacts, recent antibiotics, hospitalizations, or consumption of undercooked meat.  No fever or chills.  BMs now about once every other day. Sometimes will have hard stools or may go 3 days without a BM. No taking anything at this time to help with BMs. Feels like she is going pretty well. Not drinking much water. No abdominal pain. Some nausea/queasy feeling in stomach. No vomiting. Nausea started last week. Improving. Eating  normally right now. Taking Protonix twice a day but not before meals. GERD symptoms are well controlled. Rare breakthrough symptoms if she eats close to laying down. No dysphagia. No blood in the stool. No black stools. No unintentional weight loss.  States she does produce a lot of gas.  This is not painful, just annoying and embarrassing at times.  States cabbage makes gas worse.   No lightheadedness, dizziness. No cold or flu like symptoms. No chest pain, palpitations, shortness of breath or cough.   Past Medical History:  Diagnosis Date  . Anxiety   . Colon polyp APR 2008  . Depression   . Essential hypertension   . Folliculitis 0/17/7939  . Helicobacter pylori gastritis    AUG 2008 PREVPAC: nausea-->HELIDAC: GI UPSET  . Hypothyroidism   . Irritable bowel syndrome   . Kidney stones   . Mixed hyperlipidemia   . Mixed stress and urge urinary incontinence 09/26/2012  . Obesity   . Panic attacks   . Reflux   . Tubulovillous adenoma 08/07/10   Colonoscopy w/ Dr Oneida Alar w/ 7 polyps removed-bx showed tubular adenomas.  Largest 1cm-hepatic flexure polyp->TA.      Past Surgical History:  Procedure Laterality Date  . CHOLECYSTECTOMY    . COLONOSCOPY  APR 2008   [facility] SIMPLE ADENOMA  . COLONOSCOPY  08/07/2010   Dr. Raynald Kemp adenomas and tubulovillous adenoma; needs surveillance 2015  . COLONOSCOPY N/A 09/18/2013   Dr.Rourk- normal rectum, 2 diminutive polys in the mid sigmoid segment o/w the remainder of  the colonic mucosa appeared normal.hyperplastic poylps  . ESOPHAGOGASTRODUODENOSCOPY N/A 11/29/2014   RMR: Gastric erosions. Small hiatal hernia. Status post biopsy.   Marland Kitchen MASS EXCISION  02/28/2011   Procedure: EXCISION MASS;  Surgeon: Donato Heinz, MD;  Location: [facility];  Service: General;  Laterality: Right;  soft tissue mass  . UPPER GASTROINTESTINAL ENDOSCOPY  AUG 2008 PAIN/NAUSEA   H. PYLORI treated    Current Outpatient Medications  Medication Sig Dispense Refill  .  ALPRAZolam (XANAX) 1 MG tablet Take 1 tablet (1 mg total) by mouth 3 (three) times daily. (Patient taking differently: Take 1 mg by mouth as needed. ) 90 tablet 5  . aspirin EC 81 MG tablet Take 81 mg by mouth daily.    . blood glucose meter kit and supplies Dispense based on patient and insurance preference. Test once a day E11.9 1 each 0  . enalapril (VASOTEC) 20 MG tablet TAKE 1 TABLET (20 MG TOTAL) BY MOUTH AT BEDTIME. 30 tablet 5  . furosemide (LASIX) 20 MG tablet Take 1 tablet (20 mg total) by mouth daily. (Patient taking differently: Take 10 mg by mouth daily. ) 30 tablet 5  . glucose blood test strip OneTouch Ultra Blue Test Strip  USE TO TEST ONCE DAILY    . levothyroxine (SYNTHROID, LEVOTHROID) 50 MCG tablet TAKE 1 TABLET BY MOUTH EVERY DAY 30 tablet 5  . lovastatin (MEVACOR) 40 MG tablet TAKE 1 TABLET BY MOUTH EVERYDAY AT BEDTIME 90 tablet 1  . Magnesium 250 MG TABS Take by mouth daily.    . Oxcarbazepine (TRILEPTAL) 300 MG tablet TAKE 150 MG (HALF PILL) TWO TIMES A DAY FOR 2 WEEKS THEN INCREASE TO 300 MG TWO TIMES A DAY    . pantoprazole (PROTONIX) 40 MG tablet Take 1 tablet (40 mg total) by mouth 2 (two) times daily before a meal. 180 tablet 3  . thiamine (VITAMIN B-1) 100 MG tablet Take 100 mg by mouth daily.    . verapamil (CALAN-SR) 240 MG CR tablet TAKE 1 TABLET BY MOUTH EVERYDAY AT BEDTIME 30 tablet 5   No current facility-administered medications for this visit.     Allergies as of 12/11/2018 - Review Complete 12/11/2018  Allergen Reaction Noted  . Augmentin [amoxicillin-pot clavulanate] Diarrhea 06/09/2012  . Levaquin [levofloxacin in d5w] Other (See Comments) 07/14/2012  . Rocephin [ceftriaxone sodium in dextrose] Hives and Other (See Comments) 06/09/2012  . Valtrex [valacyclovir hcl] Hives and Other (See Comments) 06/09/2012  . Zoloft [sertraline hcl] Other (See Comments) 06/09/2012    Family History  Problem Relation Age of Onset  . Coronary artery disease Mother    . Hypertension Mother   . Heart attack Mother   . Heart disease Mother   . Suicidality Mother   . Coronary artery disease Brother        Age 5  . Heart attack Brother   . Heart disease Brother   . Diabetes Sister   . Diabetes Sister   . Colon cancer Neg Hx   . Anesthesia problems Neg Hx   . Hypotension Neg Hx   . Malignant hyperthermia Neg Hx   . Pseudochol deficiency Neg Hx   . Colon polyps Neg Hx     Social History   Socioeconomic History  . Marital status: Married    Spouse name: Not on file  . Number of children: 1  . Years of education: Not on file  . Highest education level: Not on file  Occupational History  .  Not on file  Social Needs  . Financial resource strain: Not on file  . Food insecurity    Worry: Not on file    Inability: Not on file  . Transportation needs    Medical: Not on file    Non-medical: Not on file  Tobacco Use  . Smoking status: Never Smoker  . Smokeless tobacco: Never Used  . Tobacco comment: Never Smoked  Substance and Sexual Activity  . Alcohol use: Not Currently    Comment: 2 glasses of wine a week  . Drug use: No  . Sexual activity: Never  Lifestyle  . Physical activity    Days per week: Not on file    Minutes per session: Not on file  . Stress: Not on file  Relationships  . Social Herbalist on phone: Not on file    Gets together: Not on file    Attends religious service: Not on file    Active member of club or organization: Not on file    Attends meetings of clubs or organizations: Not on file    Relationship status: Not on file  Other Topics Concern  . Not on file  Social History Narrative  . Not on file    Review of Systems: Gen: See HPI CV: See HPI Resp: See HPI GI: See HPI Derm: Denies rash Psych: Denies depression.  Admits to anxiety. Heme: Denies bruising, bleeding  Physical Exam: BP 134/82   Pulse 83   Temp 98.2 F (36.8 C) (Temporal)   Ht _0  (1.651 m)   Wt 205 lb 12.8 oz (93.4 kg)    BMI 34.25 kg/m  General:   Alert and oriented. No distress noted. Pleasant and cooperative.  Head:  Normocephalic and atraumatic. Eyes:  Conjuctiva clear without scleral icterus. Heart:  S1, S2 present without murmurs appreciated. Lungs:  Clear to auscultation bilaterally. No wheezes, rales, or rhonchi. No distress.  Abdomen:  +BS, soft, non-tender and non-distended. No rebound or guarding. No HSM or masses noted. Msk:  Symmetrical without gross deformities. Normal posture. Extremities:  Without edema. Neurologic:  Alert and  oriented x4 Psych: Normal mood and affect.

## 2018-12-11 ENCOUNTER — Other Ambulatory Visit: Payer: Self-pay

## 2018-12-11 ENCOUNTER — Other Ambulatory Visit: Payer: Self-pay | Admitting: *Deleted

## 2018-12-11 ENCOUNTER — Encounter: Payer: Self-pay | Admitting: *Deleted

## 2018-12-11 ENCOUNTER — Encounter: Payer: Self-pay | Admitting: Gastroenterology

## 2018-12-11 ENCOUNTER — Ambulatory Visit: Payer: BC Managed Care – PPO | Admitting: Gastroenterology

## 2018-12-11 VITALS — BP 134/82 | HR 83 | Temp 98.2°F | Ht 65.0 in | Wt 205.8 lb

## 2018-12-11 DIAGNOSIS — R143 Flatulence: Secondary | ICD-10-CM | POA: Diagnosis not present

## 2018-12-11 DIAGNOSIS — K5909 Other constipation: Secondary | ICD-10-CM

## 2018-12-11 DIAGNOSIS — K219 Gastro-esophageal reflux disease without esophagitis: Secondary | ICD-10-CM

## 2018-12-11 DIAGNOSIS — Z86010 Personal history of colonic polyps: Secondary | ICD-10-CM

## 2018-12-11 MED ORDER — PEG 3350-KCL-NA BICARB-NACL 420 G PO SOLR: 4000.0000 mL | Freq: Once | ORAL | 0 refills | Status: AC

## 2018-12-11 MED ORDER — PANTOPRAZOLE SODIUM 40 MG PO TBEC: 40.0000 mg | DELAYED_RELEASE_TABLET | Freq: Two times a day (BID) | ORAL | 3 refills | Status: AC

## 2018-12-11 NOTE — Patient Instructions (Addendum)
We will get you scheduled for colonoscopy with Dr. Gala Romney in the near future.   See gas and bloating handout below.  Avoid these foods and drinks: ? Foods that cause gas, such as broccoli, cabbage, cauliflower, and baked beans. ? Carbonated drinks. ? Hard candy. ? Chewing gum.  You can try adding a probiotic daily to see if this helps.   You may also try beano before meals or Gas X as needed for gas.   Please start taking Protonix 40 mg 30 minutes before breakfast and 30 minutes before dinner. I have also sent in a refill.   We will see you back after your procedure. Call if you have questions or concerns prior.   Aliene Altes, PA-C [facility]

## 2018-12-12 ENCOUNTER — Encounter: Payer: Self-pay | Admitting: Gastroenterology

## 2018-12-12 NOTE — Assessment & Plan Note (Addendum)
Well-controlled on Protonix twice daily.  Rare breakthrough symptoms only if she eats close to laying down.  No dysphagia or other alarm symptoms.  She is taking Protonix after meals.  Advise she take Protonix 40 mg 30 minutes before breakfast and 30 minutes before dinner to optimize efficacy.  Refill sent to pharmacy.

## 2018-12-12 NOTE — Assessment & Plan Note (Signed)
Patient reports passing a lot of gas.  Worse with foods like cabbage.  No associated abdominal pain.  This is more of an annoyance and somewhat embarrassing at times.  Gas and bloating handout provided. Advised to avoid these foods and drinks: ? Foods that cause gas, such as broccoli, cabbage, cauliflower, and baked beans. ? Carbonated drinks. ? Hard candy. ? Chewing gum. Try adding a daily probiotic. May use Beano beano before meals or Gas X as needed.

## 2018-12-12 NOTE — Assessment & Plan Note (Addendum)
63 year old female with past medical history of GERD, IBS constipation type, and colon polyps with tubulovillous adenoma in 2012 presenting to schedule her surveillance colonoscopy.  Last colonoscopy on 09/18/2013 with 2 diminutive polyps in the mid sigmoid segment.  Pathology with hyperplastic polyps.  Recommended repeat in 2020.  She is without any significant upper or lower GI symptoms.  She did have what she feels was an acute stomach virus last week with associated loose stools and nausea without vomiting.  No fevers, chills, bright red blood per rectum, or melena.  Stools are now back to baseline and nausea is improving.  No other significant upper or lower GI symptoms.  No alarm symptoms.  No family history of colon cancer.  Proceed with TCS with propofol with Dr. Gala Romney in the near future. The risks, benefits, and alternatives have been discussed in detail with patient. They have stated understanding and desire to proceed.  Propofol due to Xanax and oxcarbazepine. Follow-up after procedure.

## 2018-12-12 NOTE — Assessment & Plan Note (Addendum)
63 year old female with history of IBS constipation type.  BMs currently daily to every 3 days which seems to be her baseline.  Previously failed Linzess and MiraLAX in the past.  Currently not taking any supportive agents and overall feels she is doing well.  She is not interested in any medications at this time.  Her water intake is minimal.  She reports possible stomach virus last week with loose BMs 3 times daily with associated nausea without vomiting.  No fever, chills, bright red blood per rectum, or melena. Loose stools have resolved and nausea is improving.  Suspect this was likely an acute gastroenteritis as symptoms are resolving.  Continue to monitor. Advised patient to increase her water intake to help with bowel regularity.

## 2018-12-13 ENCOUNTER — Other Ambulatory Visit: Payer: Self-pay | Admitting: Family Medicine

## 2018-12-15 ENCOUNTER — Encounter: Payer: Self-pay | Admitting: *Deleted

## 2018-12-16 NOTE — Telephone Encounter (Signed)
Six mo worth 

## 2018-12-24 ENCOUNTER — Other Ambulatory Visit (HOSPITAL_COMMUNITY): Payer: BC Managed Care – PPO

## 2018-12-26 DIAGNOSIS — G501 Atypical facial pain: Secondary | ICD-10-CM | POA: Diagnosis not present

## 2018-12-26 DIAGNOSIS — F419 Anxiety disorder, unspecified: Secondary | ICD-10-CM | POA: Diagnosis not present

## 2018-12-26 DIAGNOSIS — R0683 Snoring: Secondary | ICD-10-CM | POA: Diagnosis not present

## 2018-12-26 DIAGNOSIS — R413 Other amnesia: Secondary | ICD-10-CM | POA: Diagnosis not present

## 2018-12-31 ENCOUNTER — Ambulatory Visit: Payer: BC Managed Care – PPO | Admitting: Gastroenterology

## 2019-01-01 ENCOUNTER — Telehealth: Payer: Self-pay | Admitting: Family Medicine

## 2019-01-01 NOTE — Telephone Encounter (Signed)
In order to be 100% safe it would actually be wise for the patient not to go this weekend and put it off to another weekend She should consider testing for peace of mind  The likelihood of her being sick from this interaction is very low but I cannot with [id number] certainty rule it out

## 2019-01-01 NOTE — Telephone Encounter (Signed)
Patient advised per Dr Nicki Reaper: In order to be 100% safe it would actually be wise for the patient not to go this weekend and put it off to another weekend She should consider testing for peace of mind The likelihood of her being sick from this interaction is very low but doctor cannot with [id number] certainty rule it out Patient verbalized understanding.

## 2019-01-01 NOTE — Telephone Encounter (Signed)
Patient stated she has forgot her mask and the person she was exposed to had one( a new one and he got it for her and handed it to her and she wore it for a little bit. They did not have masks on for interaction and were closer than 6 foot aprt but it was briefly- less than  Minutes. Please advise . Patient is anxious ans is to go out of town to see daughter this weekend.

## 2019-01-01 NOTE — Telephone Encounter (Signed)
Pt states Monday 12/29/2018 she was exposed to a person that has now tested +Covid  States exposure was brief, states she went to that person's house to pick up her mask  Pt is supposed to go visit her daughter tomorrow in [place] wonders if she should go or get tested  Please advise & call pt (pt anxious & wants a call back today)

## 2019-01-02 ENCOUNTER — Other Ambulatory Visit: Payer: Self-pay

## 2019-01-02 DIAGNOSIS — Z20822 Contact with and (suspected) exposure to covid-19: Secondary | ICD-10-CM

## 2019-01-04 ENCOUNTER — Telehealth: Payer: Self-pay

## 2019-01-04 LAB — NOVEL CORONAVIRUS, NAA: SARS-CoV-2, NAA: NOT DETECTED

## 2019-01-04 NOTE — Telephone Encounter (Signed)
Patient called in requesting [facility] lab results - DOB/Address verified - negative results given, patient refused setting up MyChart, no further questions.

## 2019-01-05 ENCOUNTER — Telehealth: Payer: Self-pay | Admitting: *Deleted

## 2019-01-05 ENCOUNTER — Telehealth: Payer: Self-pay | Admitting: Family Medicine

## 2019-01-05 NOTE — Telephone Encounter (Signed)
See Result Note for COVID-19 result

## 2019-01-05 NOTE — Telephone Encounter (Signed)
good

## 2019-01-05 NOTE — Telephone Encounter (Signed)
Please advise. Thank you

## 2019-01-05 NOTE — Telephone Encounter (Signed)
Pt calling to notify Dr. Richardson Landry that she has her Covid test Friday & it was negative

## 2019-01-06 ENCOUNTER — Ambulatory Visit (INDEPENDENT_AMBULATORY_CARE_PROVIDER_SITE_OTHER): Payer: BC Managed Care – PPO | Admitting: Family Medicine

## 2019-01-06 ENCOUNTER — Encounter: Payer: Self-pay | Admitting: Family Medicine

## 2019-01-06 DIAGNOSIS — J310 Chronic rhinitis: Secondary | ICD-10-CM | POA: Diagnosis not present

## 2019-01-06 DIAGNOSIS — J329 Chronic sinusitis, unspecified: Secondary | ICD-10-CM | POA: Diagnosis not present

## 2019-01-06 MED ORDER — CEFDINIR 300 MG PO CAPS: ORAL_CAPSULE | ORAL | 0 refills | Status: DC

## 2019-01-06 NOTE — Progress Notes (Signed)
   Subjective:  Audio only  Patient ID: Kaitlin [name], female    DOB: 04-14-1955, 63 y.o.   MRN: [id number]  Cough This is a new problem. Episode onset: last week. Associated symptoms comments: Runny nose on right side, congestion, chills. Doesn't feel "her best". Treatments tried: Tylenol, Vit C, B12. The treatment provided mild relief.   COVID test done on Friday and that was negative.  Virtual Visit via Telephone Note  I connected with AYASHA EGUSQUIZA on 01/06/19 at 11:00 AM EST by telephone and verified that I am speaking with the correct person using two identifiers.  Location: Patient: home Provider: office   I discussed the limitations, risks, security and privacy concerns of performing an evaluation and management service by telephone and the availability of in person appointments. I also discussed with the patient that there may be a patient responsible charge related to this service. The patient expressed understanding and agreed to proceed.   History of Present Illness:    Observations/Objective:   Assessment and Plan:   Follow Up Instructions:    I discussed the assessment and treatment plan with the patient. The patient was provided an opportunity to ask questions and all were answered. The patient agreed with the plan and demonstrated an understanding of the instructions.   The patient was advised to call back or seek an in-person evaluation if the symptoms worsen or if the condition fails to improve as anticipated.  I provided 18 minutes of non-face-to-face time during this encounter.   Vicente Males, LPN  Review of Systems  Respiratory: Positive for cough.    Did have a rather close exposure 10 days ago.  6 days ago started getting congestion.  Frontal headache.  Worse on the right side positive gunky secretions.  Slight cough no shortness of breath no fever no sore throat  Negative COVID-19 test  No vomiting no diarrhea    Objective:   Physical  Exam   Virtual     Assessment & Plan:  Impression post viral rhinosinusitis.  False negatives discussed with patient.  Antibiotics prescribed symptom care discussed warning signs discussed

## 2019-02-10 ENCOUNTER — Telehealth: Payer: Self-pay | Admitting: Family Medicine

## 2019-02-10 NOTE — Telephone Encounter (Signed)
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 She has no symptoms but runny nose. Should she be tested or should she wait because she has to be tested before she gets her coloscopy done on 12/21. Please advise

## 2019-02-11 NOTE — Telephone Encounter (Signed)
Patient notified and verbalized understanding. 

## 2019-02-11 NOTE — Telephone Encounter (Signed)
No perfect answer but if runny nose new or increased compared to usual would get a test

## 2019-02-13 ENCOUNTER — Other Ambulatory Visit: Payer: Self-pay

## 2019-02-13 DIAGNOSIS — Z20822 Contact with and (suspected) exposure to covid-19: Secondary | ICD-10-CM

## 2019-02-15 ENCOUNTER — Telehealth: Payer: Self-pay

## 2019-02-15 NOTE — Telephone Encounter (Signed)
Pt called for covid test results and advised results are not back

## 2019-02-16 ENCOUNTER — Encounter: Payer: Self-pay | Admitting: Family Medicine

## 2019-02-16 ENCOUNTER — Telehealth: Payer: Self-pay | Admitting: Family Medicine

## 2019-02-16 LAB — NOVEL CORONAVIRUS, NAA: SARS-CoV-2, NAA: NOT DETECTED

## 2019-02-16 NOTE — Telephone Encounter (Signed)
Please write and send work note. Thank you

## 2019-02-16 NOTE — Telephone Encounter (Signed)
Pt needs work note stating she is negative for covid

## 2019-02-16 NOTE — Telephone Encounter (Signed)
Pt had COVID test done on 02/13/2019 and received results from [facility] this morning. Negative results. Please advise. Thank you

## 2019-02-16 NOTE — Telephone Encounter (Signed)
Ok may write abnd send

## 2019-02-18 ENCOUNTER — Other Ambulatory Visit: Payer: Self-pay | Admitting: Family Medicine

## 2019-02-18 NOTE — Patient Instructions (Signed)
   Your procedure is scheduled on: 02/23/2019  Report to [facility] at     1:15 PM.  Call this number if you have problems the morning of surgery: [phone]   Remember:              Follow Directions on the letter you received from Your Physician's office regarding the Bowel Prep  :  Take these medicines the morning of surgery with A SIP OF WATER: Levothyroxine, Pantoprazole, and Oxcarbazepine   Do not wear jewelry, make-up or nail polish.    Do not bring valuables to the hospital.  Contacts, dentures or bridgework may not be worn into surgery.  .   Patients discharged the day of surgery will not be allowed to drive home.     Colonoscopy, Adult, Care After This sheet gives you information about how to care for yourself after your procedure. Your health care provider may also give you more specific instructions. If you have problems or questions, contact your health care provider. What can I expect after the procedure? After the procedure, it is common to have:  A small amount of blood in your stool for 24 hours after the procedure.  Some gas.  Mild abdominal cramping or bloating.  Follow these instructions at home: General instructions   For the first 24 hours after the procedure: ? Do not drive or use machinery. ? Do not sign important documents. ? Do not drink alcohol. ? Do your regular daily activities at a slower pace than normal. ? Eat soft, easy-to-digest foods. ? Rest often.  Take over-the-counter or prescription medicines only as told by your health care provider.  It is up to you to get the results of your procedure. Ask your health care provider, or the department performing the procedure, when your results will be ready. Relieving cramping and bloating  Try walking around when you have cramps or feel bloated.  Apply heat to your abdomen as told by your health care provider. Use a heat source that your health care provider recommends, such as a moist heat  pack or a heating pad. ? Place a towel between your skin and the heat source. ? Leave the heat on for 20-30 minutes. ? Remove the heat if your skin turns bright red. This is especially important if you are unable to feel pain, heat, or cold. You may have a greater risk of getting burned. Eating and drinking  Drink enough fluid to keep your urine clear or pale yellow.  Resume your normal diet as instructed by your health care provider. Avoid heavy or fried foods that are hard to digest.  Avoid drinking alcohol for as long as instructed by your health care provider. Contact a health care provider if:  You have blood in your stool 2-3 days after the procedure. Get help right away if:  You have more than a small spotting of blood in your stool.  You pass large blood clots in your stool.  Your abdomen is swollen.  You have nausea or vomiting.  You have a fever.  You have increasing abdominal pain that is not relieved with medicine. This information is not intended to replace advice given to you by your health care provider. Make sure you discuss any questions you have with your health care provider. Document Released: 10/04/2003 Document Revised: 11/14/2015 Document Reviewed: 05/03/2015 Elsevier Interactive Patient Education  [employer].

## 2019-02-20 ENCOUNTER — Other Ambulatory Visit: Payer: Self-pay

## 2019-02-20 ENCOUNTER — Other Ambulatory Visit (HOSPITAL_COMMUNITY)
Admission: RE | Admit: 2019-02-20 | Discharge: 2019-02-20 | Disposition: A | Discharge status: Home / Self Care | Payer: BC Managed Care – PPO | Source: Ambulatory Visit | Attending: Internal Medicine | Admitting: Internal Medicine

## 2019-02-20 ENCOUNTER — Encounter (HOSPITAL_COMMUNITY): Payer: Self-pay

## 2019-02-20 ENCOUNTER — Encounter (HOSPITAL_COMMUNITY)
Admission: RE | Admit: 2019-02-20 | Discharge: 2019-02-20 | Disposition: A | Discharge status: Home / Self Care | Payer: BC Managed Care – PPO | Source: Ambulatory Visit | Attending: Internal Medicine | Admitting: Internal Medicine

## 2019-02-20 DIAGNOSIS — Z20828 Contact with and (suspected) exposure to other viral communicable diseases: Secondary | ICD-10-CM | POA: Insufficient documentation

## 2019-02-20 DIAGNOSIS — Z01812 Encounter for preprocedural laboratory examination: Secondary | ICD-10-CM | POA: Insufficient documentation

## 2019-02-20 LAB — BASIC METABOLIC PANEL
Anion gap: 8 (ref 5–15)
BUN: 16 mg/dL (ref 8–23)
CO2: 26 mmol/L (ref 22–32)
Calcium: 10.4 mg/dL — ABNORMAL HIGH (ref 8.9–10.3)
Chloride: 106 mmol/L (ref 98–111)
Creatinine, Ser: 0.66 mg/dL (ref 0.44–1.00)
GFR calc Af Amer: >60 mL/min (ref >60)
GFR calc non Af Amer: >60 mL/min (ref >60)
Glucose, Bld: 119 mg/dL — ABNORMAL HIGH (ref 70–99)
Potassium: 4.1 mmol/L (ref 3.5–5.1)
Sodium: 140 mmol/L (ref 135–145)

## 2019-02-20 LAB — SARS CORONAVIRUS 2 (TAT 6-24 HRS): SARS Coronavirus 2: NEGATIVE

## 2019-02-23 ENCOUNTER — Ambulatory Visit (HOSPITAL_COMMUNITY)
Admission: RE | Admit: 2019-02-23 | Discharge: 2019-02-23 | Disposition: A | Discharge status: Home / Self Care | Payer: BC Managed Care – PPO | Attending: Internal Medicine | Admitting: Internal Medicine

## 2019-02-23 ENCOUNTER — Encounter (HOSPITAL_COMMUNITY)
Admission: RE | Disposition: A | Discharge status: Home / Self Care | Payer: Self-pay | Source: Home / Self Care | Attending: Internal Medicine

## 2019-02-23 ENCOUNTER — Ambulatory Visit (HOSPITAL_COMMUNITY): Payer: BC Managed Care – PPO | Admitting: Anesthesiology

## 2019-02-23 ENCOUNTER — Encounter (HOSPITAL_COMMUNITY): Payer: Self-pay | Admitting: Internal Medicine

## 2019-02-23 DIAGNOSIS — K219 Gastro-esophageal reflux disease without esophagitis: Secondary | ICD-10-CM | POA: Insufficient documentation

## 2019-02-23 DIAGNOSIS — Z7982 Long term (current) use of aspirin: Secondary | ICD-10-CM | POA: Insufficient documentation

## 2019-02-23 DIAGNOSIS — D120 Benign neoplasm of cecum: Secondary | ICD-10-CM | POA: Diagnosis not present

## 2019-02-23 DIAGNOSIS — E039 Hypothyroidism, unspecified: Secondary | ICD-10-CM | POA: Diagnosis not present

## 2019-02-23 DIAGNOSIS — Z86010 Personal history of colonic polyps: Secondary | ICD-10-CM | POA: Insufficient documentation

## 2019-02-23 DIAGNOSIS — Z881 Allergy status to other antibiotic agents status: Secondary | ICD-10-CM | POA: Diagnosis not present

## 2019-02-23 DIAGNOSIS — I10 Essential (primary) hypertension: Secondary | ICD-10-CM | POA: Insufficient documentation

## 2019-02-23 DIAGNOSIS — F419 Anxiety disorder, unspecified: Secondary | ICD-10-CM | POA: Diagnosis not present

## 2019-02-23 DIAGNOSIS — Z79890 Hormone replacement therapy (postmenopausal): Secondary | ICD-10-CM | POA: Diagnosis not present

## 2019-02-23 DIAGNOSIS — Z1211 Encounter for screening for malignant neoplasm of colon: Secondary | ICD-10-CM | POA: Insufficient documentation

## 2019-02-23 DIAGNOSIS — F329 Major depressive disorder, single episode, unspecified: Secondary | ICD-10-CM | POA: Diagnosis not present

## 2019-02-23 DIAGNOSIS — K635 Polyp of colon: Secondary | ICD-10-CM | POA: Diagnosis not present

## 2019-02-23 DIAGNOSIS — Z8249 Family history of ischemic heart disease and other diseases of the circulatory system: Secondary | ICD-10-CM | POA: Insufficient documentation

## 2019-02-23 DIAGNOSIS — Z79899 Other long term (current) drug therapy: Secondary | ICD-10-CM | POA: Insufficient documentation

## 2019-02-23 DIAGNOSIS — Z09 Encounter for follow-up examination after completed treatment for conditions other than malignant neoplasm: Secondary | ICD-10-CM | POA: Diagnosis not present

## 2019-02-23 DIAGNOSIS — E782 Mixed hyperlipidemia: Secondary | ICD-10-CM | POA: Diagnosis not present

## 2019-02-23 DIAGNOSIS — Z888 Allergy status to other drugs, medicaments and biological substances status: Secondary | ICD-10-CM | POA: Insufficient documentation

## 2019-02-23 HISTORY — PX: COLONOSCOPY WITH PROPOFOL: SHX5780

## 2019-02-23 HISTORY — PX: POLYPECTOMY: SHX5525

## 2019-02-23 SURGERY — COLONOSCOPY WITH PROPOFOL
Anesthesia: General

## 2019-02-23 MED ORDER — CHLORHEXIDINE GLUCONATE CLOTH 2 % EX PADS: 6.0000 | MEDICATED_PAD | Freq: Once | CUTANEOUS | Status: DC

## 2019-02-23 MED ORDER — ONDANSETRON HCL 4 MG/2ML IJ SOLN
INTRAMUSCULAR | Status: AC
  Filled 2019-02-23: qty 2

## 2019-02-23 MED ORDER — HYDROMORPHONE HCL 1 MG/ML IJ SOLN: 0.2500 mg | INTRAMUSCULAR | Status: DC | PRN

## 2019-02-23 MED ORDER — PROMETHAZINE HCL 25 MG/ML IJ SOLN: 6.2500 mg | INTRAMUSCULAR | Status: DC | PRN

## 2019-02-23 MED ORDER — MIDAZOLAM HCL 2 MG/2ML IJ SOLN: 0.5000 mg | Freq: Once | INTRAMUSCULAR | Status: DC | PRN

## 2019-02-23 MED ORDER — ONDANSETRON HCL 4 MG/2ML IJ SOLN
INTRAMUSCULAR | Status: DC | PRN
  Administered 2019-02-23: 4 mg via INTRAVENOUS

## 2019-02-23 MED ORDER — PROPOFOL 500 MG/50ML IV EMUL
INTRAVENOUS | Status: DC | PRN
  Administered 2019-02-23: 75 ug/kg/min via INTRAVENOUS
  Administered 2019-02-23: 200 ug/kg/min via INTRAVENOUS

## 2019-02-23 MED ORDER — KETAMINE HCL 50 MG/5ML IJ SOSY
PREFILLED_SYRINGE | INTRAMUSCULAR | Status: AC
  Filled 2019-02-23: qty 5

## 2019-02-23 MED ORDER — PROPOFOL 10 MG/ML IV BOLUS
INTRAVENOUS | Status: DC | PRN
  Administered 2019-02-23: 20 mg via INTRAVENOUS

## 2019-02-23 MED ORDER — KETAMINE HCL 10 MG/ML IJ SOLN
INTRAMUSCULAR | Status: DC | PRN
  Administered 2019-02-23: 20 mg via INTRAVENOUS

## 2019-02-23 MED ORDER — HYDROCODONE-ACETAMINOPHEN 7.5-325 MG PO TABS: 1.0000 | ORAL_TABLET | Freq: Once | ORAL | Status: DC | PRN

## 2019-02-23 MED ORDER — LACTATED RINGERS IV SOLN: INTRAVENOUS | Status: DC

## 2019-02-23 NOTE — Transfer of Care (Signed)
Immediate Anesthesia Transfer of Care Note  Patient: Kaitlin [name]  Procedure(s) Performed: COLONOSCOPY WITH PROPOFOL (N/A ) POLYPECTOMY  Patient Location: PACU  Anesthesia Type:General  Level of Consciousness: awake, alert , oriented and patient cooperative  Airway & Oxygen Therapy: Patient Spontanous Breathing  Post-op Assessment: Report given to RN and Post -op Vital signs reviewed and stable  Post vital signs: Reviewed and stable  Last Vitals:  Vitals Value Taken Time  BP 123/72 02/23/19 1524  Temp    Pulse 76 02/23/19 1527  Resp 19 02/23/19 1527  SpO2 97 % 02/23/19 1527  Vitals shown include unvalidated device data.  Last Pain:  Vitals:   02/23/19 1457  TempSrc:   PainSc: 3       Patients Stated Pain Goal: 6 ([id number])  Complications: No apparent anesthesia complications

## 2019-02-23 NOTE — Anesthesia Preprocedure Evaluation (Signed)
Anesthesia Evaluation  Patient identified by MRN, date of birth, ID band Patient awake    Reviewed: Allergy & Precautions, NPO status , Patient's Chart, lab work & pertinent test results  Airway Mallampati: II  TM Distance: >3 FB Neck ROM: Full    Dental no notable dental hx. (+) Poor Dentition, Missing   Pulmonary neg pulmonary ROS,    Pulmonary exam normal breath sounds clear to auscultation       Cardiovascular Exercise Tolerance: Good hypertension, Pt. on medications negative cardio ROS Normal cardiovascular examI Rhythm:Regular Rate:Normal  Denies know cardiac issues    Neuro/Psych Anxiety Depression negative neurological ROS  negative psych ROS   GI/Hepatic Neg liver ROS, hiatal hernia, GERD  Medicated and Controlled,  Endo/Other  Hyperthyroidism   Renal/GU negative Renal ROS  negative genitourinary   Musculoskeletal negative musculoskeletal ROS (+)   Abdominal   Peds negative pediatric ROS (+)  Hematology negative hematology ROS (+)   Anesthesia Other Findings   Reproductive/Obstetrics negative OB ROS                             Anesthesia Physical Anesthesia Plan  ASA: II  Anesthesia Plan: General   Post-op Pain Management:    Induction: Intravenous  PONV Risk Score and Plan: 3 and TIVA, Propofol infusion, Treatment may vary due to age or medical condition and Ondansetron  Airway Management Planned: Simple Face Mask and Nasal Cannula  Additional Equipment:   Intra-op Plan:   Post-operative Plan: Extubation in OR  Informed Consent: I have reviewed the patients History and Physical, chart, labs and discussed the procedure including the risks, benefits and alternatives for the proposed anesthesia with the patient or authorized representative who has indicated his/her understanding and acceptance.     Dental advisory given  Plan Discussed with: CRNA  Anesthesia  Plan Comments: (Plan Full PPE use  Plan GA with GETA as needed d/w pt -WTP with same after Q&A)        Anesthesia Quick Evaluation

## 2019-02-23 NOTE — Discharge Instructions (Signed)
Colonoscopy, Adult, Care After This sheet gives you information about how to care for yourself after your procedure. Your doctor may also give you more specific instructions. If you have problems or questions, call your doctor. What can I expect after the procedure? After the procedure, it is common to have:  A small amount of blood in your poop for 24 hours.  Some gas.  Mild cramping or bloating in your belly. Follow these instructions at home: General instructions  For the first 24 hours after the procedure: ? Do not drive or use machinery. ? Do not sign important documents. ? Do not drink alcohol. ? Do your daily activities more slowly than normal. ? Eat foods that are soft and easy to digest.  Take over-the-counter or prescription medicines only as told by your doctor. To help cramping and bloating:   Try walking around.  Put heat on your belly (abdomen) as told by your doctor. Use a heat source that your doctor recommends, such as a moist heat pack or a heating pad. ? Put a towel between your skin and the heat source. ? Leave the heat on for 20-30 minutes. ? Remove the heat if your skin turns bright red. This is especially important if you cannot feel pain, heat, or cold. You can get burned. Eating and drinking   Drink enough fluid to keep your pee (urine) clear or pale yellow.  Return to your normal diet as told by your doctor. Avoid heavy or fried foods that are hard to digest.  Avoid drinking alcohol for as long as told by your doctor. Contact a doctor if:  You have blood in your poop (stool) 2-3 days after the procedure. Get help right away if:  You have more than a small amount of blood in your poop.  You see large clumps of tissue (blood clots) in your poop.  Your belly is swollen.  You feel sick to your stomach (nauseous).  You throw up (vomit).  You have a fever.  You have belly pain that gets worse, and medicine does not help your  pain. Summary  After the procedure, it is common to have a small amount of blood in your poop. You may also have mild cramping and bloating in your belly.  For the first 24 hours after the procedure, do not drive or use machinery, do not sign important documents, and do not drink alcohol.  Get help right away if you have a lot of blood in your poop, feel sick to your stomach, have a fever, or have more belly pain. This information is not intended to replace advice given to you by your health care provider. Make sure you discuss any questions you have with your health care provider. Document Released: 03/24/2010 Document Revised: 12/20/2016 Document Reviewed: 11/14/2015 Elsevier Patient Education  2020 [place] After These instructions provide you with information about caring for yourself after your procedure. Your health care provider may also give you more specific instructions. Your treatment has been planned according to current medical practices, but problems sometimes occur. Call your health care provider if you have any problems or questions after your procedure. What can I expect after the procedure? After your procedure, you may:  Feel sleepy for several hours.  Feel clumsy and have poor balance for several hours.  Feel forgetful about what happened after the procedure.  Have poor judgment for several hours.  Feel nauseous or vomit.  Have a sore throat  if you had a breathing tube during the procedure. Follow these instructions at home: For at least 24 hours after the procedure:      Have a responsible adult stay with you. It is important to have someone help care for you until you are awake and alert.  Rest as needed.  Do not: ? Participate in activities in which you could fall or become injured. ? Drive. ? Use heavy machinery. ? Drink alcohol. ? Take sleeping pills or medicines that cause drowsiness. ? Make important  decisions or sign legal documents. ? Take care of children on your own. Eating and drinking  Follow the diet that is recommended by your health care provider.  If you vomit, drink water, juice, or soup when you can drink without vomiting.  Make sure you have little or no nausea before eating solid foods. General instructions  Take over-the-counter and prescription medicines only as told by your health care provider.  If you have sleep apnea, surgery and certain medicines can increase your risk for breathing problems. Follow instructions from your health care provider about wearing your sleep device: ? Anytime you are sleeping, including during daytime naps. ? While taking prescription pain medicines, sleeping medicines, or medicines that make you drowsy.  If you smoke, do not smoke without supervision.  Keep all follow-up visits as told by your health care provider. This is important. Contact a health care provider if:  You keep feeling nauseous or you keep vomiting.  You feel light-headed.  You develop a rash.  You have a fever. Get help right away if:  You have trouble breathing. Summary  For several hours after your procedure, you may feel sleepy and have poor judgment.  Have a responsible adult stay with you for at least 24 hours or until you are awake and alert. This information is not intended to replace advice given to you by your health care provider. Make sure you discuss any questions you have with your health care provider. Document Released: 06/12/2015 Document Revised: 05/20/2017 Document Reviewed: 06/12/2015 Elsevier Patient Education  2020 [employer].   Further recommendations to follow pending review of pathology report  Heide Scales at [phone]  - unable to reach.

## 2019-02-23 NOTE — Anesthesia Procedure Notes (Signed)
Procedure Name: General with mask airway Date/Time: 02/23/2019 2:55 PM Performed by: Andree Elk, Shaneen Reeser A, CRNA Pre-anesthesia Checklist: Timeout performed, Patient being monitored, Suction available, Emergency Drugs available and Patient identified Patient Re-evaluated:Patient Re-evaluated prior to induction Oxygen Delivery Method: Non-rebreather mask

## 2019-02-23 NOTE — H&P (Signed)
_0 @   Primary Care Physician:  [name], MD Primary Gastroenterologist:  Dr. Gala Romney  Pre-Procedure History & Physical: HPI:  Kaitlin [name] is a 63 y.o. female here for surveillance colonoscopy.  History of high-grade adenoma removed previously.  Past Medical History:  Diagnosis Date  . Anxiety   . Colon polyp APR 2008  . Depression   . Essential hypertension   . Folliculitis 6/46/8032  . Helicobacter pylori gastritis    AUG 2008 PREVPAC: nausea-->HELIDAC: GI UPSET  . Hypothyroidism   . Irritable bowel syndrome   . Kidney stones   . Mixed hyperlipidemia   . Mixed stress and urge urinary incontinence 09/26/2012  . Obesity   . Panic attacks   . Reflux   . Tubulovillous adenoma 08/07/10   Colonoscopy w/ Dr Oneida Alar w/ 7 polyps removed-bx showed tubular adenomas.  Largest 1cm-hepatic flexure polyp->TA.      Past Surgical History:  Procedure Laterality Date  . CHOLECYSTECTOMY    . COLONOSCOPY  APR 2008   AC/[facility] SIMPLE ADENOMA  . COLONOSCOPY  08/07/2010   Dr. Raynald Kemp adenomas and tubulovillous adenoma; needs surveillance 2015  . COLONOSCOPY N/A 09/18/2013   Dr.Renelda Kilian- normal rectum, 2 diminutive polys in the mid sigmoid segment o/w the remainder of the colonic mucosa appeared normal.hyperplastic poylps  . ESOPHAGOGASTRODUODENOSCOPY N/A 11/29/2014   RMR: Gastric erosions. Small hiatal hernia. Status post biopsy.   Marland Kitchen MASS EXCISION  02/28/2011   Procedure: EXCISION MASS;  Surgeon: Donato Heinz, MD;  Location: [facility];  Service: General;  Laterality: Right;  soft tissue mass  . UPPER GASTROINTESTINAL ENDOSCOPY  AUG 2008 PAIN/NAUSEA   H. PYLORI treated    Prior to Admission medications   Medication Sig Start Date End Date Taking? Authorizing Provider  ALPRAZolam (XANAX) 1 MG tablet TAKE 1 TABLET BY MOUTH 3 TIMES A DAY Patient taking differently: Take 1 mg by mouth daily as needed for anxiety.  12/16/18  Yes [name], MD  aspirin EC 81 MG tablet Take 81 mg  by mouth daily.   Yes [provider]  ELDERBERRY PO Take 100 mg by mouth daily at 12 noon.   Yes [provider]  enalapril (VASOTEC) 20 MG tablet TAKE 1 TABLET BY MOUTH EVERYDAY AT BEDTIME 02/18/19  Yes [name], MD  furosemide (LASIX) 20 MG tablet Take 1 tablet (20 mg total) by mouth daily. Patient taking differently: Take 10 mg by mouth daily.  11/13/17  Yes [name], MD  levothyroxine (SYNTHROID, LEVOTHROID) 50 MCG tablet TAKE 1 TABLET BY MOUTH EVERY DAY Patient taking differently: Take 50 mcg by mouth daily before breakfast.  06/04/18  Yes [name], MD  lovastatin (MEVACOR) 40 MG tablet TAKE 1 TABLET BY MOUTH EVERYDAY AT BEDTIME Patient taking differently: Take 40 mg by mouth at bedtime.  12/03/18  Yes [name], MD  Magnesium 250 MG TABS Take 250 mg by mouth daily.    Yes [provider]  Oxcarbazepine (TRILEPTAL) 300 MG tablet Take 300 mg by mouth 2 (two) times daily.  10/06/18  Yes [provider]  pantoprazole (PROTONIX) 40 MG tablet Take 1 tablet (40 mg total) by mouth 2 (two) times daily before a meal. Patient taking differently: Take 40 mg by mouth 2 (two) times daily.  12/11/18  Yes Aliene Altes S, PA-C  thiamine (VITAMIN B-1) 100 MG tablet Take 100 mg by mouth daily.   Yes [provider]  verapamil (CALAN-SR) 240 MG CR  tablet TAKE 1 TABLET BY MOUTH EVERYDAY AT BEDTIME Patient taking differently: Take 240 mg by mouth daily.  12/08/18  Yes [name], MD  vitamin B-12 (CYANOCOBALAMIN) 500 MCG tablet Take 1,000 mcg by mouth daily.   Yes [provider]  vitamin C (ASCORBIC ACID) 250 MG tablet Take 250 mg by mouth daily.   Yes [provider]  blood glucose meter kit and supplies Dispense based on patient and insurance preference. Test once a day E11.9 07/04/15   [name], MD  cefdinir (OMNICEF) 300 MG capsule Take one capsule po BID for 10 days Patient not taking: Reported on  02/16/2019 01/06/19   [name], MD  CONTOUR TEST test strip TEST ONCE DAILY AS DIRECTED 12/16/18   [name], MD    Allergies as of 12/11/2018 - Review Complete 12/11/2018  Allergen Reaction Noted  . Augmentin [amoxicillin-pot clavulanate] Diarrhea 06/09/2012  . Levaquin [levofloxacin in d5w] Other (See Comments) 07/14/2012  . Rocephin [ceftriaxone sodium in dextrose] Hives and Other (See Comments) 06/09/2012  . Valtrex [valacyclovir hcl] Hives and Other (See Comments) 06/09/2012  . Zoloft [sertraline hcl] Other (See Comments) 06/09/2012    Family History  Problem Relation Age of Onset  . Coronary artery disease Mother   . Hypertension Mother   . Heart attack Mother   . Heart disease Mother   . Suicidality Mother   . Coronary artery disease Brother        Age 9  . Heart attack Brother   . Heart disease Brother   . Diabetes Sister   . Diabetes Sister   . Colon cancer Neg Hx   . Anesthesia problems Neg Hx   . Hypotension Neg Hx   . Malignant hyperthermia Neg Hx   . Pseudochol deficiency Neg Hx   . Colon polyps Neg Hx     Social History   Socioeconomic History  . Marital status: Married    Spouse name: Not on file  . Number of children: 1  . Years of education: Not on file  . Highest education level: Not on file  Occupational History  . Not on file  Tobacco Use  . Smoking status: Never Smoker  . Smokeless tobacco: Never Used  . Tobacco comment: Never Smoked  Substance and Sexual Activity  . Alcohol use: Not Currently    Comment: 2 glasses of wine a week  . Drug use: No  . Sexual activity: Never  Other Topics Concern  . Not on file  Social History Narrative  . Not on file   Social Determinants of Health   Financial Resource Strain:   . Difficulty of Paying Living Expenses: Not on file  Food Insecurity:   . Worried About Charity fundraiser in the Last Year: Not on file  . Ran Out of Food in the Last Year: Not on file  Transportation Needs:    . Lack of Transportation (Medical): Not on file  . Lack of Transportation (Non-Medical): Not on file  Physical Activity:   . Days of Exercise per Week: Not on file  . Minutes of Exercise per Session: Not on file  Stress:   . Feeling of Stress : Not on file  Social Connections:   . Frequency of Communication with Friends and Family: Not on file  . Frequency of Social Gatherings with Friends and Family: Not on file  . Attends Religious Services: Not on file  . Active Member of Clubs or Organizations: Not  on file  . Attends Archivist Meetings: Not on file  . Marital Status: Not on file  Intimate Partner Violence:   . Fear of Current or Ex-Partner: Not on file  . Emotionally Abused: Not on file  . Physically Abused: Not on file  . Sexually Abused: Not on file    Review of Systems: See HPI, otherwise negative ROS  Physical Exam: BP (!) 160/85   Pulse 78   Temp 98.7 F (37.1 C) (Oral)   Resp 16   SpO2 97%  General:   Alert,  Well-developed, well-nourished, pleasant and cooperative in NAD Neck:  Supple; no masses or thyromegaly. No significant cervical adenopathy. Lungs:  Clear throughout to auscultation.   No wheezes, crackles, or rhonchi. No acute distress. Heart:  Regular rate and rhythm; no murmurs, clicks, rubs,  or gallops. Abdomen: Non-distended, normal bowel sounds.  Soft and nontender without appreciable mass or hepatosplenomegaly.  Pulses:  Normal pulses noted. Extremities:  Without clubbing or edema.  Impression/Plan: 63 year old lady with a history of advanced adenoma removed from her colon previously.  Here for surveillance examination.  The risks, benefits, limitations, alternatives and imponderables have been reviewed with the patient. Questions have been answered. All parties are agreeable.      Notice: This dictation was prepared with Dragon dictation along with smaller phrase technology. Any transcriptional errors that result from this process  are unintentional and may not be corrected upon review.

## 2019-02-23 NOTE — Op Note (Signed)
[facility] Patient Name: Kaitlin [name] Procedure Date: 02/23/2019 2:33 PM MRN: [id number] Date of Birth: 30-Jul-1955 Attending MD: Norvel Richards , MD CSN: [id number] Age: 63 Admit Type: Outpatient Procedure:                Colonoscopy Indications:              High risk colon cancer surveillance: Personal                            history of colonic polyps Providers:                Norvel Richards, MD, Otis Peak B. Sharon Seller, RN,                            Raphael Gibney, Technician Referring MD:              Medicines:                Propofol per Anesthesia Complications:            No immediate complications. Estimated Blood Loss:     Estimated blood loss was minimal. Procedure:                Pre-Anesthesia Assessment:                           - Prior to the procedure, a History and Physical                            was performed, and patient medications and                            allergies were reviewed. The patient's tolerance of                            previous anesthesia was also reviewed. The risks                            and benefits of the procedure and the sedation                            options and risks were discussed with the patient.                            All questions were answered, and informed consent                            was obtained. Prior Anticoagulants: The patient has                            taken no previous anticoagulant or antiplatelet                            agents. ASA Grade Assessment: II - A patient with  mild systemic disease. After reviewing the risks                            and benefits, the patient was deemed in                            satisfactory condition to undergo the procedure.                           After obtaining informed consent, the colonoscope                            was passed under direct vision. Throughout the                            procedure,  the patient's blood pressure, pulse, and                            oxygen saturations were monitored continuously. The                            [id number] scope was introduced through                            the anus and advanced to the the cecum, identified                            by appendiceal orifice and ileocecal valve. The                            colonoscopy was performed without difficulty. The                            patient tolerated the procedure well. The quality                            of the bowel preparation was adequate. The                            ileocecal valve, appendiceal orifice, and rectum                            were photographed. Scope In: 3:03:51 PM Scope Out: 3:18:54 PM Scope Withdrawal Time: 0 hours 11 minutes 20 seconds  Total Procedure Duration: 0 hours 15 minutes 3 seconds  Findings:      The perianal and digital rectal examinations were normal.      Two sessile polyps were found in the cecum. The polyps were 2 to 4 mm in       size. These polyps were removed with a cold snare. Resection and       retrieval were complete. Estimated blood loss was minimal.      The exam was otherwise without abnormality on direct and retroflexion       views. Impression:               -  Two 2 to 4 mm polyps in the cecum, removed with a                            cold snare. Resected and retrieved.                           - The examination was otherwise normal on direct                            and retroflexion views. Moderate Sedation:      Moderate (conscious) sedation was personally administered by an       anesthesia professional. The following parameters were monitored: oxygen       saturation, heart rate, blood pressure, respiratory rate, EKG, adequacy       of pulmonary ventilation, and response to care. Recommendation:           - Patient has a contact number available for                            emergencies. The signs and  symptoms of potential                            delayed complications were discussed with the                            patient. Return to normal activities tomorrow.                            Written discharge instructions were provided to the                            patient.                           - Resume previous diet.                           - Continue present medications.                           - Repeat colonoscopy date to be determined after                            pending pathology results are reviewed for                            surveillance based on pathology results.                           - Return to GI office (date not yet determined). Procedure Code(s):        --- Professional ---                           [phone], Colonoscopy, flexible; with removal of  tumor(s), polyp(s), or other lesion(s) by snare                            technique Diagnosis Code(s):        --- Professional ---                           Z86.010, Personal history of colonic polyps                           K63.5, Polyp of colon CPT copyright 2019 American Medical Association. All rights reserved. The codes documented in this report are preliminary and upon coder review may  be revised to meet current compliance requirements. Cristopher Estimable. Trygve Thal, MD Norvel Richards, MD 02/23/2019 3:25:38 PM This report has been signed electronically. Number of Addenda: 0

## 2019-02-23 NOTE — Anesthesia Postprocedure Evaluation (Signed)
Anesthesia Post Note  Patient: Kaitlin [name]  Procedure(s) Performed: COLONOSCOPY WITH PROPOFOL (N/A ) POLYPECTOMY  Patient location during evaluation: PACU Anesthesia Type: General Level of consciousness: awake and alert and oriented Pain management: pain level controlled Vital Signs Assessment: post-procedure vital signs reviewed and stable Respiratory status: spontaneous breathing Cardiovascular status: stable Postop Assessment: no apparent nausea or vomiting Anesthetic complications: no     Last Vitals:  Vitals:   02/23/19 1336  BP: (!) 160/85  Pulse: 78  Resp: 16  Temp: 37.1 C  SpO2: 97%    Last Pain:  Vitals:   02/23/19 1457  TempSrc:   PainSc: 3                  Antavious Spanos A

## 2019-02-25 ENCOUNTER — Encounter: Payer: Self-pay | Admitting: Internal Medicine

## 2019-02-25 LAB — SURGICAL PATHOLOGY

## 2019-03-05 ENCOUNTER — Telehealth: Payer: Self-pay | Admitting: Internal Medicine

## 2019-03-05 NOTE — Telephone Encounter (Signed)
Spoke with pt. Letter was previously mailed to pt. Discussed results with pt. Mailing copy of results to pt.

## 2019-03-05 NOTE — Telephone Encounter (Signed)
Pt had colonoscopy on 02/23/2019 by [facility] and was calling to check on her results. [phone] or [phone]

## 2019-03-11 ENCOUNTER — Other Ambulatory Visit: Payer: Self-pay | Admitting: Family Medicine

## 2019-03-27 ENCOUNTER — Telehealth: Payer: Self-pay | Admitting: Family Medicine

## 2019-03-27 NOTE — Telephone Encounter (Signed)
Patient wanting to know if she can get Covid shot with  Her health issues and medications.

## 2019-04-08 ENCOUNTER — Telehealth: Payer: Self-pay | Admitting: Family Medicine

## 2019-04-08 NOTE — Telephone Encounter (Signed)
Pt will qualify when sh e qualifies, no letter changes that

## 2019-04-08 NOTE — Telephone Encounter (Signed)
Discussed with pt and pt verbalized understanding.  °

## 2019-04-08 NOTE — Telephone Encounter (Signed)
Patient is requesting a letter to get a Covid shot early due her health issues and mental issues because she is not of age to get the shot . Please advise

## 2019-04-09 ENCOUNTER — Encounter: Payer: Self-pay | Admitting: Family Medicine

## 2019-04-11 ENCOUNTER — Ambulatory Visit: Payer: BC Managed Care – PPO | Attending: Internal Medicine

## 2019-04-11 DIAGNOSIS — Z23 Encounter for immunization: Secondary | ICD-10-CM | POA: Insufficient documentation

## 2019-04-11 NOTE — Progress Notes (Signed)
   Covid-19 Vaccination Clinic  Name:  Kaitlin [name]    MRN: [id number] DOB: 1956-03-04  04/11/2019  [name] was observed post Covid-19 immunization for 15 minutes without incidence. She was provided with Vaccine Information Sheet and instruction to access the V-Safe system.   [name] was instructed to call 911 with any severe reactions post vaccine: Marland Kitchen Difficulty breathing  . Swelling of your face and throat  . A fast heartbeat  . A bad rash all over your body  . Dizziness and weakness    Immunizations Administered    Name Date Dose VIS Date Route   Pfizer COVID-19 Vaccine 04/11/2019 10:23 AM 0.3 mL 02/13/2019 Intramuscular   Manufacturer: [place]   Lot: [id number]   [place]: [id number]

## 2019-04-15 ENCOUNTER — Emergency Department (HOSPITAL_COMMUNITY)
Admission: EM | Admit: 2019-04-15 | Discharge: 2019-04-15 | Disposition: A | Discharge status: Home / Self Care | Payer: BC Managed Care – PPO | Attending: Emergency Medicine | Admitting: Emergency Medicine

## 2019-04-15 ENCOUNTER — Encounter (HOSPITAL_COMMUNITY): Payer: Self-pay | Admitting: *Deleted

## 2019-04-15 ENCOUNTER — Other Ambulatory Visit: Payer: Self-pay

## 2019-04-15 ENCOUNTER — Emergency Department (HOSPITAL_COMMUNITY): Payer: BC Managed Care – PPO

## 2019-04-15 DIAGNOSIS — R079 Chest pain, unspecified: Secondary | ICD-10-CM | POA: Insufficient documentation

## 2019-04-15 DIAGNOSIS — R0789 Other chest pain: Secondary | ICD-10-CM | POA: Diagnosis not present

## 2019-04-15 DIAGNOSIS — Z7982 Long term (current) use of aspirin: Secondary | ICD-10-CM | POA: Insufficient documentation

## 2019-04-15 DIAGNOSIS — E039 Hypothyroidism, unspecified: Secondary | ICD-10-CM | POA: Insufficient documentation

## 2019-04-15 DIAGNOSIS — Z79899 Other long term (current) drug therapy: Secondary | ICD-10-CM | POA: Diagnosis not present

## 2019-04-15 DIAGNOSIS — R109 Unspecified abdominal pain: Secondary | ICD-10-CM | POA: Insufficient documentation

## 2019-04-15 DIAGNOSIS — R739 Hyperglycemia, unspecified: Secondary | ICD-10-CM | POA: Diagnosis not present

## 2019-04-15 DIAGNOSIS — E1165 Type 2 diabetes mellitus with hyperglycemia: Secondary | ICD-10-CM | POA: Diagnosis not present

## 2019-04-15 LAB — TROPONIN I (HIGH SENSITIVITY)
Troponin I (High Sensitivity): <2 ng/L (ref <18)
Troponin I (High Sensitivity): <2 ng/L (ref <18)

## 2019-04-15 LAB — CBC
HCT: 40.1 % (ref 36.0–46.0)
Hemoglobin: 12.8 g/dL (ref 12.0–15.0)
MCH: 28.2 pg (ref 26.0–34.0)
MCHC: 31.9 g/dL (ref 30.0–36.0)
MCV: 88.3 fL (ref 80.0–100.0)
Platelets: 251 10*3/uL (ref 150–400)
RBC: 4.54 MIL/uL (ref 3.87–5.11)
RDW: 13 % (ref 11.5–15.5)
WBC: 7.5 10*3/uL (ref 4.0–10.5)
nRBC: 0 % (ref 0.0–0.2)

## 2019-04-15 LAB — BASIC METABOLIC PANEL
Anion gap: 9 (ref 5–15)
BUN: 17 mg/dL (ref 8–23)
CO2: 21 mmol/L — ABNORMAL LOW (ref 22–32)
Calcium: 9.8 mg/dL (ref 8.9–10.3)
Chloride: 101 mmol/L (ref 98–111)
Creatinine, Ser: 0.62 mg/dL (ref 0.44–1.00)
GFR calc Af Amer: >60 mL/min (ref >60)
GFR calc non Af Amer: >60 mL/min (ref >60)
Glucose, Bld: 184 mg/dL — ABNORMAL HIGH (ref 70–99)
Potassium: 3.6 mmol/L (ref 3.5–5.1)
Sodium: 131 mmol/L — ABNORMAL LOW (ref 135–145)

## 2019-04-15 IMAGING — DX DG CHEST 1V PORT
1 series · 1 of 1 positions shown · non-contrast
Comparison: [DATE]

CLINICAL DATA: Increasing left-sided chest pain, symptoms for 2
weeks

EXAM:
PORTABLE CHEST 1 VIEW

[chest ap grid]
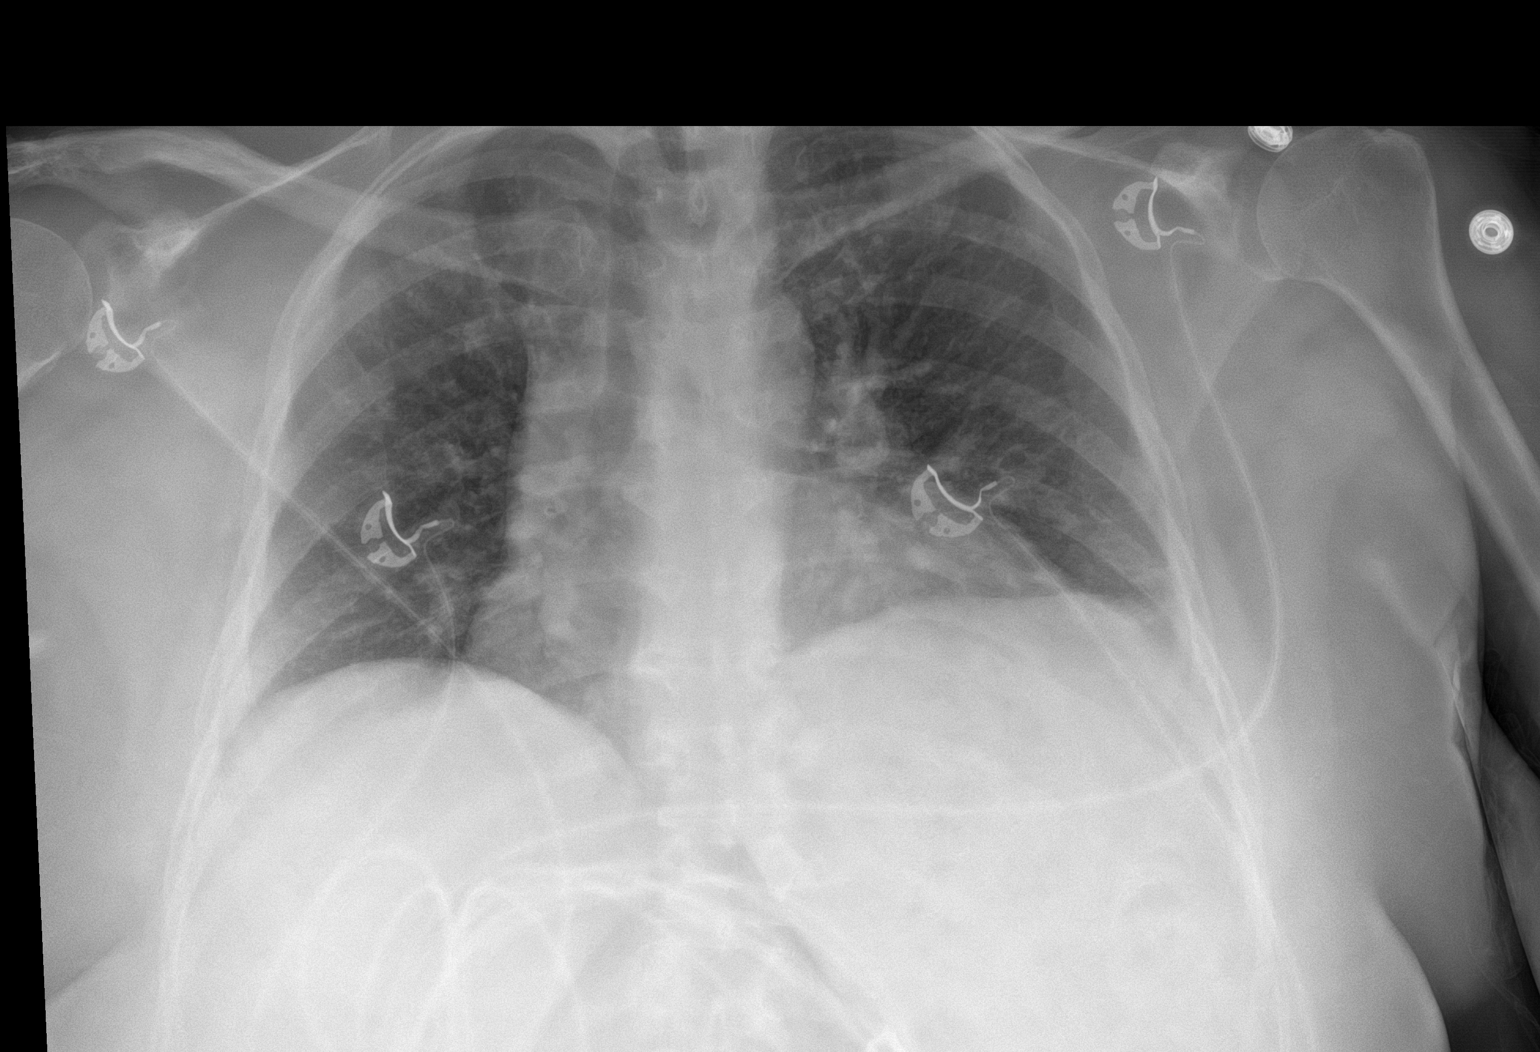

[1 of 1 positions shown; findings below may reference images not displayed]

FINDINGS: The heart size and mediastinal contours are within normal limits.
Both lungs are clear. The visualized skeletal structures are
unremarkable.
IMPRESSION: No active disease.

## 2019-04-15 MED ORDER — ALPRAZOLAM 0.5 MG PO TABS
1.0000 mg | ORAL_TABLET | Freq: Once | ORAL | Status: AC
  Administered 2019-04-15: 1 mg via ORAL
  Filled 2019-04-15: qty 2

## 2019-04-15 MED ORDER — SODIUM CHLORIDE 0.9% FLUSH
3.0000 mL | Freq: Once | INTRAVENOUS | Status: AC
  Administered 2019-04-15: 3 mL via INTRAVENOUS

## 2019-04-15 NOTE — Discharge Instructions (Addendum)
Glucose was slightly elevated, but otherwise tests were good.  Tylenol or ibuprofen for pain.  I do recommend follow-up with your cardiologist.

## 2019-04-15 NOTE — ED Provider Notes (Signed)
[facility] EMERGENCY DEPARTMENT Provider Note   CSN: [id number] Arrival date & time: 04/15/19  2045     History Chief Complaint  Patient presents with  . Chest Pain    Kaitlin [name] is a 64 y.o. female.  Intermittent left chest pain with radiation under the breast for 2 to 3 weeks without dyspnea or nausea.  A couple of brief episodes of diaphoresis.  No previous cardiac history.  Non-smoker.  Past medical history hypertension and prediabetes.  Family history positive for CAD in brother and mother.  Severity is mild to moderate.  Nothing makes symptoms better or worse.        Past Medical History:  Diagnosis Date  . Anxiety   . Colon polyp APR 2008  . Depression   . Essential hypertension   . Folliculitis 06/11/8117  . Helicobacter pylori gastritis    AUG 2008 PREVPAC: nausea-->HELIDAC: GI UPSET  . Hypothyroidism   . Irritable bowel syndrome   . Kidney stones   . Mixed hyperlipidemia   . Mixed stress and urge urinary incontinence 09/26/2012  . Obesity   . Panic attacks   . Reflux   . Tubulovillous adenoma 08/07/10   Colonoscopy w/ Dr Oneida Alar w/ 7 polyps removed-bx showed tubular adenomas.  Largest 1cm-hepatic flexure polyp->TA.      Patient Active Problem List   Diagnosis Date Noted  . Flatus 12/11/2018  . Loss of weight 05/27/2017  . PTSD (post-traumatic stress disorder) 04/09/2017  . MDD (major depressive disorder), recurrent episode, moderate ([place]) 04/09/2017  . Phobia to insects 04/09/2017  . Prediabetes 12/20/2016  . Abdominal pain, epigastric 02/20/2016  . GERD (gastroesophageal reflux disease) 02/20/2016  . Mucosal abnormality of stomach   . Hiatal hernia   . Impaired fasting glucose 06/04/2014  . Benign paroxysmal positional vertigo 06/04/2014  . Constipation 02/17/2014  . Elevated parathyroid hormone 05/24/2013  . Folliculitis 14/78/2956  . Mixed stress and urge urinary incontinence 09/26/2012  . Subserous leiomyoma of uterus 09/01/2012  .  Hypothyroidism 06/12/2012  . Venous stasis 06/12/2012  . Essential hypertension, benign 12/11/2010  . Chest pain 12/11/2010  . IBS (irritable bowel syndrome) 07/05/2010  . Hx of adenomatous colonic polyps 07/05/2010    Past Surgical History:  Procedure Laterality Date  . CHOLECYSTECTOMY    . COLONOSCOPY  APR 2008   AC/ SIMPLE ADENOMA  . COLONOSCOPY  08/07/2010   Dr. Raynald Kemp adenomas and tubulovillous adenoma; needs surveillance 2015  . COLONOSCOPY N/A 09/18/2013   Dr.Rourk- normal rectum, 2 diminutive polys in the mid sigmoid segment o/w the remainder of the colonic mucosa appeared normal.hyperplastic poylps  . COLONOSCOPY WITH PROPOFOL N/A 02/23/2019   Procedure: COLONOSCOPY WITH PROPOFOL;  Surgeon: Daneil Dolin, MD;  Location: [facility] SUITE;  Service: Endoscopy;  Laterality: N/A;  2:45pm - office unable to reach pt to move her up  . ESOPHAGOGASTRODUODENOSCOPY N/A 11/29/2014   RMR: Gastric erosions. Small hiatal hernia. Status post biopsy.   Marland Kitchen MASS EXCISION  02/28/2011   Procedure: EXCISION MASS;  Surgeon: Donato Heinz, MD;  Location: [facility] ORS;  Service: General;  Laterality: Right;  soft tissue mass  . POLYPECTOMY  02/23/2019   Procedure: POLYPECTOMY;  Surgeon: Daneil Dolin, MD;  Location: [facility] SU[facility];  Service: Endoscopy;;  . UPPER GASTROINTESTINAL ENDOSCOPY  AUG 2008 PAIN/NAUSEA   H. PYLORI treated     OB History    Gravida  1   Para  1   Term  Preterm      AB      Living        SAB      TAB      Ectopic      Multiple      Live Births              Family History  Problem Relation Age of Onset  . Coronary artery disease Mother   . Hypertension Mother   . Heart attack Mother   . Heart disease Mother   . Suicidality Mother   . Coronary artery disease Brother        Age 14  . Heart attack Brother   . Heart disease Brother   . Diabetes Sister   . Diabetes Sister   . Colon cancer Neg Hx   . Anesthesia problems Neg Hx   .  Hypotension Neg Hx   . Malignant hyperthermia Neg Hx   . Pseudochol deficiency Neg Hx   . Colon polyps Neg Hx     Social History   Tobacco Use  . Smoking status: Never Smoker  . Smokeless tobacco: Never Used  . Tobacco comment: Never Smoked  Substance Use Topics  . Alcohol use: Not Currently    Comment: 2 glasses of wine a week  . Drug use: No    Home Medications Prior to Admission medications   Medication Sig Start Date End Date Taking? Authorizing Provider  ALPRAZolam (XANAX) 1 MG tablet TAKE 1 TABLET BY MOUTH 3 TIMES A DAY Patient taking differently: Take 1 mg by mouth daily as needed for anxiety.  12/16/18   [name], MD  aspirin EC 81 MG tablet Take 81 mg by mouth daily.    [provider]  blood glucose meter kit and supplies Dispense based on patient and insurance preference. Test once a day E11.9 07/04/15   [name], MD  CONTOUR TEST test strip TEST ONCE DAILY AS DIRECTED 12/16/18   [name], MD  ELDERBERRY PO Take 100 mg by mouth daily at 12 noon.    [provider]  enalapril (VASOTEC) 20 MG tablet TAKE 1 TABLET BY MOUTH EVERYDAY AT BEDTIME 02/18/19   [name], MD  furosemide (LASIX) 20 MG tablet TAKE 1 TABLET BY MOUTH EVERY DAY 03/11/19   [name], MD  levothyroxine (SYNTHROID, LEVOTHROID) 50 MCG tablet TAKE 1 TABLET BY MOUTH EVERY DAY Patient taking differently: Take 50 mcg by mouth daily before breakfast.  06/04/18   [name], MD  lovastatin (MEVACOR) 40 MG tablet TAKE 1 TABLET BY MOUTH EVERYDAY AT BEDTIME Patient taking differently: Take 40 mg by mouth at bedtime.  12/03/18   [name], MD  Magnesium 250 MG TABS Take 250 mg by mouth daily.     [provider]  Oxcarbazepine (TRILEPTAL) 300 MG tablet Take 300 mg by mouth 2 (two) times daily.  10/06/18   [provider]  pantoprazole (PROTONIX) 40 MG tablet Take 1 tablet (40 mg total) by mouth 2 (two) times daily before a  meal. Patient taking differently: Take 40 mg by mouth 2 (two) times daily.  12/11/18   Erenest Rasher, PA-C  thiamine (VITAMIN B-1) 100 MG tablet Take 100 mg by mouth daily.    [provider]  verapamil (CALAN-SR) 240 MG CR tablet TAKE 1 TABLET BY MOUTH EVERYDAY AT BEDTIME Patient taking differently: Take 240 mg by mouth daily.  12/08/18   [name],  MD  vitamin B-12 (CYANOCOBALAMIN) 500 MCG tablet Take 1,000 mcg by mouth daily.    [provider]  vitamin C (ASCORBIC ACID) 250 MG tablet Take 250 mg by mouth daily.    [provider]    Allergies    Augmentin [amoxicillin-pot clavulanate], Ipratropium, Levaquin [levofloxacin in d5w], Rocephin [ceftriaxone sodium in dextrose], Valtrex [valacyclovir hcl], and Zoloft [sertraline hcl]  Review of Systems   Review of Systems  All other systems reviewed and are negative.   Physical Exam Updated Vital Signs BP (!) 177/82   Pulse 94   Temp 97.6 F (36.4 C) (Oral)   Resp 18   Ht _0  (1.651 m)   Wt 95.3 kg   SpO2 97%   BMI 34.95 kg/m   Physical Exam Vitals and nursing note reviewed.  Constitutional:      Appearance: She is well-developed.  HENT:     Head: Normocephalic and atraumatic.  Eyes:     Conjunctiva/sclera: Conjunctivae normal.  Cardiovascular:     Rate and Rhythm: Normal rate and regular rhythm.  Pulmonary:     Effort: Pulmonary effort is normal.     Breath sounds: Normal breath sounds.     Comments: Tender left chest wall. Abdominal:     General: Bowel sounds are normal.     Palpations: Abdomen is soft.  Musculoskeletal:        General: Normal range of motion.     Cervical back: Neck supple.  Skin:    General: Skin is warm and dry.  Neurological:     General: No focal deficit present.     Mental Status: She is alert and oriented to person, place, and time.  Psychiatric:        Behavior: Behavior normal.     ED Results / Procedures / Treatments   Labs (all labs ordered  are listed, but only abnormal results are displayed) Labs Reviewed  BASIC METABOLIC PANEL - Abnormal; Notable for the following components:      Result Value   Sodium 131 (*)    CO2 21 (*)    Glucose, Bld 184 (*)    All other components within normal limits  CBC  TROPONIN I (HIGH SENSITIVITY)  TROPONIN I (HIGH SENSITIVITY)    EKG EKG Interpretation  Date/Time:  Wednesday April 15 2019 21:07:29 EST Ventricular Rate:  94 PR Interval:    QRS Duration: 90 QT Interval:  366 QTC Calculation: 458 R Axis:   -25 Text Interpretation: Sinus rhythm Probable left atrial enlargement Left ventricular hypertrophy Inferior infarct, old Anterior Q waves, possibly due to LVH Baseline wander in lead(s) III Confirmed by Nat Christen [phone]) on 04/15/2019 10:12:39 PM   Radiology DG Chest Portable 1 View  Result Date: 04/15/2019 CLINICAL DATA:  Increasing left-sided chest pain, symptoms for 2 weeks EXAM: PORTABLE CHEST 1 VIEW COMPARISON:  04/18/2018 FINDINGS: The heart size and mediastinal contours are within normal limits. Both lungs are clear. The visualized skeletal structures are unremarkable. IMPRESSION: No active disease. Electronically Signed   By: Randa Ngo M.D.   On: 04/15/2019 21:21    Procedures Procedures (including critical care time)  Medications Ordered in ED Medications  sodium chloride flush (NS) 0.9 % injection 3 mL (3 mLs Intravenous Given 04/15/19 2130)    ED Course  I have reviewed the triage vital signs and the nursing notes.  Pertinent labs & imaging results that were available during my care of the patient were reviewed by me and considered  in my medical decision making (see chart for details).    MDM Rules/Calculators/A&P                      Presents with atypical chest pain.  She is hemodynamically stable.  EKG, chest x-ray, troponin all negative.  Stable for outpatient management. Final Clinical Impression(s) / ED Diagnoses Final diagnoses:  Chest pain,  unspecified type  Hyperglycemia    Rx / DC Orders ED Discharge Orders    None       Nat Christen, MD 04/15/19 2303

## 2019-04-15 NOTE — ED Triage Notes (Signed)
Pt c/o left side chest pain x 2 weeks that has gotten increasingly worse; pt states the pain has woke her up a couple of times at night; pt states the pain radiates around her breast to the middle of her chest and up around the back of her neck

## 2019-05-01 ENCOUNTER — Telehealth: Payer: Self-pay | Admitting: Family Medicine

## 2019-05-01 NOTE — Telephone Encounter (Signed)
Pt would like to know if she should get her 2nd covid vaccine. She is having some congestion, chills, headache since last week.  Her 2nd shot is scheduled for 3/2

## 2019-05-01 NOTE — Telephone Encounter (Signed)
If she is not having any documented fever or shortness of breath I believe it would be fine for her to get her second shot should she run fevers over the weekend she should do a virtual visit on Monday should her symptoms persist she should do a virtual visit And if she had any emergent issue go to ER

## 2019-05-01 NOTE — Telephone Encounter (Signed)
No fever. Congestion, chills, runny nose, slight headache for about one week. Would like to know if she should get her 2nd covid shot on the 2nd. Has to take covid test next Friday for work. They are testing everybody. Has not had any exposure that she knows. Has had 9 covid test and they were all negative.

## 2019-05-04 NOTE — Telephone Encounter (Signed)
Patient notified and stated she was feeling better this morning and would let us know if any further problems and will plan on getting her 2nd vaccine as scheduled.

## 2019-05-05 ENCOUNTER — Ambulatory Visit: Payer: BC Managed Care – PPO | Attending: Internal Medicine

## 2019-05-05 DIAGNOSIS — Z23 Encounter for immunization: Secondary | ICD-10-CM

## 2019-05-05 NOTE — Progress Notes (Signed)
   Covid-19 Vaccination Clinic  Name:  Kaitlin [name]    MRN: [id number] DOB: 06/19/1955  05/05/2019  Kaitlin [name] was observed post Covid-19 immunization for 15 minutes without incident. She was provided with Vaccine Information Sheet and instruction to access the V-Safe system.   Kaitlin [name] was instructed to call 911 with any severe reactions post vaccine: Marland Kitchen Difficulty breathing  . Swelling of face and throat  . A fast heartbeat  . A bad rash all over body  . Dizziness and weakness   Immunizations Administered    Name Date Dose VIS Date Route   Pfizer COVID-19 Vaccine 05/05/2019  8:38 AM 0.3 mL 02/13/2019 Intramuscular   Manufacturer: [place]   Lot: [id number]   [place]: [id number]

## 2019-05-07 ENCOUNTER — Other Ambulatory Visit: Payer: Self-pay

## 2019-05-07 ENCOUNTER — Ambulatory Visit (INDEPENDENT_AMBULATORY_CARE_PROVIDER_SITE_OTHER): Payer: BC Managed Care – PPO | Admitting: Family Medicine

## 2019-05-07 DIAGNOSIS — J329 Chronic sinusitis, unspecified: Secondary | ICD-10-CM | POA: Diagnosis not present

## 2019-05-07 DIAGNOSIS — J310 Chronic rhinitis: Secondary | ICD-10-CM | POA: Diagnosis not present

## 2019-05-07 MED ORDER — DOXYCYCLINE HYCLATE 100 MG PO TABS: ORAL_TABLET | ORAL | 0 refills | Status: DC

## 2019-05-07 NOTE — Progress Notes (Signed)
   Subjective:  Audio only  Patient ID: Kaitlin [name], female    DOB: 11-16-1955, 64 y.o.   MRN: [id number]  Sinusitis This is a new problem. (Runny eyes, itchy eyes, sometimes eyelashes will be stuck together, drainage, headache  COVID test done tomorrow) Treatments tried: Allegra 24 hour. The treatment provided mild relief.  pt states that she opened a window a few days ago. Pt has had COVID shot.   Virtual Visit via Telephone Note  I connected with Kaitlin [name] on 05/07/19 at  3:30 PM EST by telephone and verified that I am speaking with the correct person using two identifiers.  Location: Patient: home Provider: office   I discussed the limitations, risks, security and privacy concerns of performing an evaluation and management service by telephone and the availability of in person appointments. I also discussed with the patient that there may be a patient responsible charge related to this service. The patient expressed understanding and agreed to proceed.   History of Present Illness:    Observations/Objective:   Assessment and Plan:   Follow Up Instructions:    I discussed the assessment and treatment plan with the patient. The patient was provided an opportunity to ask questions and all were answered. The patient agreed with the plan and demonstrated an understanding of the instructions.   The patient was advised to call back or seek an in-person evaluation if the symptoms worsen or if the condition fails to improve as anticipated.  I provided 22 minutes of non-face-to-face time during this encounter.     Patient just got her second Covid shot.  Positive frontal headache.  Positive congestion positive drainage intermittent sore throat diminished energy.  No fever no cough  No GI symptoms   Review of Systems     Objective:   Physical Exam  Virtual      Assessment & Plan:  Impression viral syndrome versus rhinosinusitis.  In the past patient  had resistant rhinosinusitis and her ENT gave her a round of clindamycin which took care of things.  She has not been on antibiotics for a while and the symptoms are new so will try doxycycline first rationale discussed

## 2019-05-10 ENCOUNTER — Other Ambulatory Visit: Payer: Self-pay | Admitting: Family Medicine

## 2019-05-14 ENCOUNTER — Telehealth: Payer: Self-pay | Admitting: Family Medicine

## 2019-05-14 MED ORDER — CEPHALEXIN 500 MG PO CAPS: ORAL_CAPSULE | ORAL | 0 refills | Status: DC

## 2019-05-14 NOTE — Telephone Encounter (Signed)
Please advise. Thank you

## 2019-05-14 NOTE — Telephone Encounter (Signed)
Keflex 500 tid ten d 

## 2019-05-14 NOTE — Telephone Encounter (Signed)
Medication sent in and pt is aware. Pt asked was it the same at what the ENT had placed her (ENT prescribed clindamycin) informed pt that this was not the same antibiotic. Informed pt that if she has any issues with med, to give [name] a call. Pt then lost signal and phone disconnected. Will call back to make sure she has all info.

## 2019-05-14 NOTE — Telephone Encounter (Signed)
Patient finished up her first antibiotic but still has drainage and cough and requesting another round of antibiotic called into [facility]

## 2019-05-15 NOTE — Telephone Encounter (Signed)
Called and pt verbalized understanding and she has already picked up the keflex.

## 2019-05-26 ENCOUNTER — Other Ambulatory Visit: Payer: Self-pay | Admitting: Family Medicine

## 2019-05-28 ENCOUNTER — Encounter: Payer: Self-pay | Admitting: Family Medicine

## 2019-05-28 ENCOUNTER — Other Ambulatory Visit: Payer: Self-pay

## 2019-05-28 ENCOUNTER — Ambulatory Visit: Payer: BC Managed Care – PPO | Admitting: Family Medicine

## 2019-05-28 VITALS — BP 138/84 | Temp 96.7°F | Wt 208.2 lb

## 2019-05-28 DIAGNOSIS — Z79899 Other long term (current) drug therapy: Secondary | ICD-10-CM | POA: Diagnosis not present

## 2019-05-28 DIAGNOSIS — F411 Generalized anxiety disorder: Secondary | ICD-10-CM | POA: Diagnosis not present

## 2019-05-28 DIAGNOSIS — E039 Hypothyroidism, unspecified: Secondary | ICD-10-CM | POA: Diagnosis not present

## 2019-05-28 DIAGNOSIS — I10 Essential (primary) hypertension: Secondary | ICD-10-CM | POA: Diagnosis not present

## 2019-05-28 DIAGNOSIS — E7800 Pure hypercholesterolemia, unspecified: Secondary | ICD-10-CM

## 2019-05-28 MED ORDER — ENALAPRIL MALEATE 20 MG PO TABS: ORAL_TABLET | ORAL | 1 refills | Status: DC

## 2019-05-28 MED ORDER — ALPRAZOLAM 1 MG PO TABS: 1.0000 mg | ORAL_TABLET | Freq: Three times a day (TID) | ORAL | 5 refills | Status: DC

## 2019-05-28 MED ORDER — VERAPAMIL HCL ER 240 MG PO TBCR: EXTENDED_RELEASE_TABLET | ORAL | 1 refills | Status: DC

## 2019-05-28 MED ORDER — FUROSEMIDE 20 MG PO TABS: 20.0000 mg | ORAL_TABLET | Freq: Every day | ORAL | 1 refills | Status: DC

## 2019-05-28 MED ORDER — LOVASTATIN 40 MG PO TABS: 40.0000 mg | ORAL_TABLET | Freq: Every day | ORAL | 1 refills | Status: DC

## 2019-05-28 NOTE — Progress Notes (Signed)
   Subjective:    Patient ID: Kaitlin [name], female    DOB: 1955/06/28, 64 y.o.   MRN: [id number]  Hypertension This is a chronic problem. There are no compliance problems.   Pt has sinus issues that have been going on for a few weeks. Pt states that she is having sneezing and watery eyes with some drainage. Pt states the allergies are better than before.   Pt checks sugars once in a while. Pt states her sugars have not been over 150.  Pt having hip pain. Pt unable to lay on right hip.   Pt would like to have an allergy shot also. ENT had mentioned this to patient but insurance will not cover it through specialist.   Allergies acting up, ent thought would be good idea  Allergy takes alegra now, finishing antibiotic  flonase in the past, caused nosebleed   Sore on the lateral hip, going on awhile, no hx of injury  Work is going good    Patient has of course ongoing substantial generalized anxiety disorder.  She definitely needs her medication in order to function well.  Notes no substantial side effects.  Reports complete compliance with her thyroid medicine.  No symptoms of high or low thyroid On her feet gets exercise at work    Review of Systems No headache, no major weight loss or weight gain, no chest pain no back pain abdominal pain no change in bowel habits complete ROS otherwise negative     Objective:   Physical Exam Alert and oriented, vitals reviewed and stable, NAD ENT-TM's and ext canals WNL bilat via otoscopic exam Soft palate, tonsils and post pharynx WNL via oropharyngeal exam Neck-symmetric, no masses; thyroid nonpalpable and nontender Pulmonary-no tachypnea or accessory muscle use; Clear without wheezes via auscultation Card--no abnrml murmurs, rhythm reg and rate WNL Carotid pulses symmetric, without bruits        Assessment & Plan:  Impression #1 hypertension.  Good control discussed maintain same meds  2.  Hyperlipidemia status uncertain  discussed will assess  3.  Prediabetes.  Patient states glucoses overall good control.  4.  Hypothyroidism status uncertain need to do blood work  5.  Allergic rhinitis patient wishes now to hold off on allergy shot referral.  6.  Generalized anxiety disorder.  Substantial and profound.  Patient absolutely needs for anxiety medication.  Has seen mental health folks in the past.  We have been maintaining the medications for her sanity   f u six mo

## 2019-06-05 DIAGNOSIS — I10 Essential (primary) hypertension: Secondary | ICD-10-CM | POA: Diagnosis not present

## 2019-06-05 DIAGNOSIS — E039 Hypothyroidism, unspecified: Secondary | ICD-10-CM | POA: Diagnosis not present

## 2019-06-05 DIAGNOSIS — Z79899 Other long term (current) drug therapy: Secondary | ICD-10-CM | POA: Diagnosis not present

## 2019-06-06 LAB — LIPID PANEL
Chol/HDL Ratio: 3.9 ratio (ref 0.0–4.4)
Cholesterol, Total: 186 mg/dL (ref 100–199)
HDL: 48 mg/dL (ref >39)
LDL Chol Calc (NIH): 117 mg/dL — ABNORMAL HIGH (ref 0–99)
Triglycerides: 118 mg/dL (ref 0–149)
VLDL Cholesterol Cal: 21 mg/dL (ref 5–40)

## 2019-06-06 LAB — TSH: TSH: 4.41 u[IU]/mL (ref 0.450–4.500)

## 2019-06-07 ENCOUNTER — Encounter: Payer: Self-pay | Admitting: Family Medicine

## 2019-06-16 DIAGNOSIS — M6530 Trigger finger, unspecified finger: Secondary | ICD-10-CM | POA: Insufficient documentation

## 2019-06-16 DIAGNOSIS — M65331 Trigger finger, right middle finger: Secondary | ICD-10-CM | POA: Diagnosis not present

## 2019-06-19 DIAGNOSIS — J0100 Acute maxillary sinusitis, unspecified: Secondary | ICD-10-CM | POA: Diagnosis not present

## 2019-06-19 DIAGNOSIS — J301 Allergic rhinitis due to pollen: Secondary | ICD-10-CM | POA: Diagnosis not present

## 2019-06-24 ENCOUNTER — Telehealth: Payer: BC Managed Care – PPO | Admitting: Cardiology

## 2019-06-26 DIAGNOSIS — R413 Other amnesia: Secondary | ICD-10-CM | POA: Diagnosis not present

## 2019-06-26 DIAGNOSIS — R200 Anesthesia of skin: Secondary | ICD-10-CM | POA: Diagnosis not present

## 2019-06-26 DIAGNOSIS — G501 Atypical facial pain: Secondary | ICD-10-CM | POA: Diagnosis not present

## 2019-06-26 DIAGNOSIS — R2689 Other abnormalities of gait and mobility: Secondary | ICD-10-CM | POA: Diagnosis not present

## 2019-07-29 ENCOUNTER — Other Ambulatory Visit (HOSPITAL_COMMUNITY): Payer: Self-pay | Admitting: Obstetrics & Gynecology

## 2019-07-29 DIAGNOSIS — Z1231 Encounter for screening mammogram for malignant neoplasm of breast: Secondary | ICD-10-CM

## 2019-08-04 DIAGNOSIS — G4719 Other hypersomnia: Secondary | ICD-10-CM | POA: Diagnosis not present

## 2019-08-04 DIAGNOSIS — R200 Anesthesia of skin: Secondary | ICD-10-CM | POA: Diagnosis not present

## 2019-08-04 DIAGNOSIS — G501 Atypical facial pain: Secondary | ICD-10-CM | POA: Diagnosis not present

## 2019-08-04 DIAGNOSIS — R0683 Snoring: Secondary | ICD-10-CM | POA: Diagnosis not present

## 2019-08-26 ENCOUNTER — Other Ambulatory Visit: Payer: Self-pay

## 2019-08-26 ENCOUNTER — Ambulatory Visit (HOSPITAL_COMMUNITY)
Admission: RE | Admit: 2019-08-26 | Discharge: 2019-08-26 | Disposition: A | Discharge status: Home / Self Care | Payer: BC Managed Care – PPO | Source: Ambulatory Visit | Attending: Obstetrics & Gynecology | Admitting: Obstetrics & Gynecology

## 2019-08-26 DIAGNOSIS — Z1231 Encounter for screening mammogram for malignant neoplasm of breast: Secondary | ICD-10-CM | POA: Diagnosis not present

## 2019-08-26 IMAGING — MG DIGITAL SCREENING BILAT W/ TOMO W/ CAD
6 of 10 series · 6 of 30 positions shown · non-contrast
Comparison: Previous exam(s).

CLINICAL DATA: Screening.

EXAM:
DIGITAL SCREENING BILATERAL MAMMOGRAM WITH TOMO AND CAD

[R MLO synth-2D]
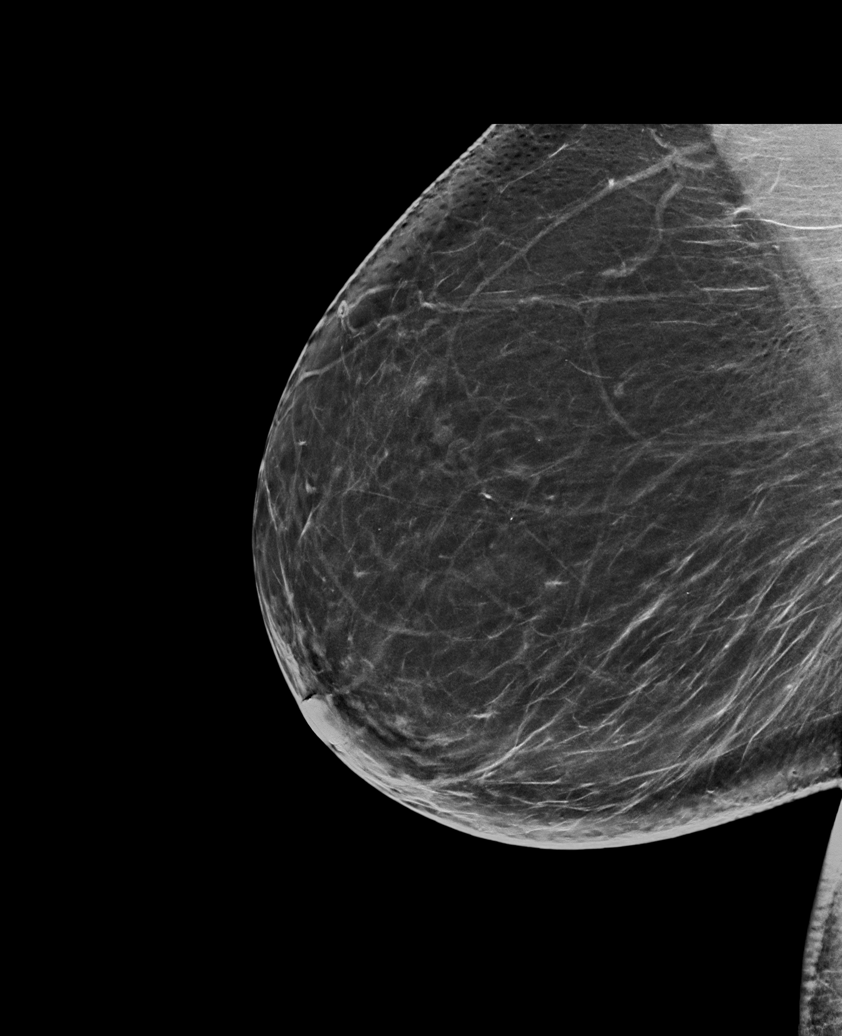

[L MLO synth-2D (1 of 2)]
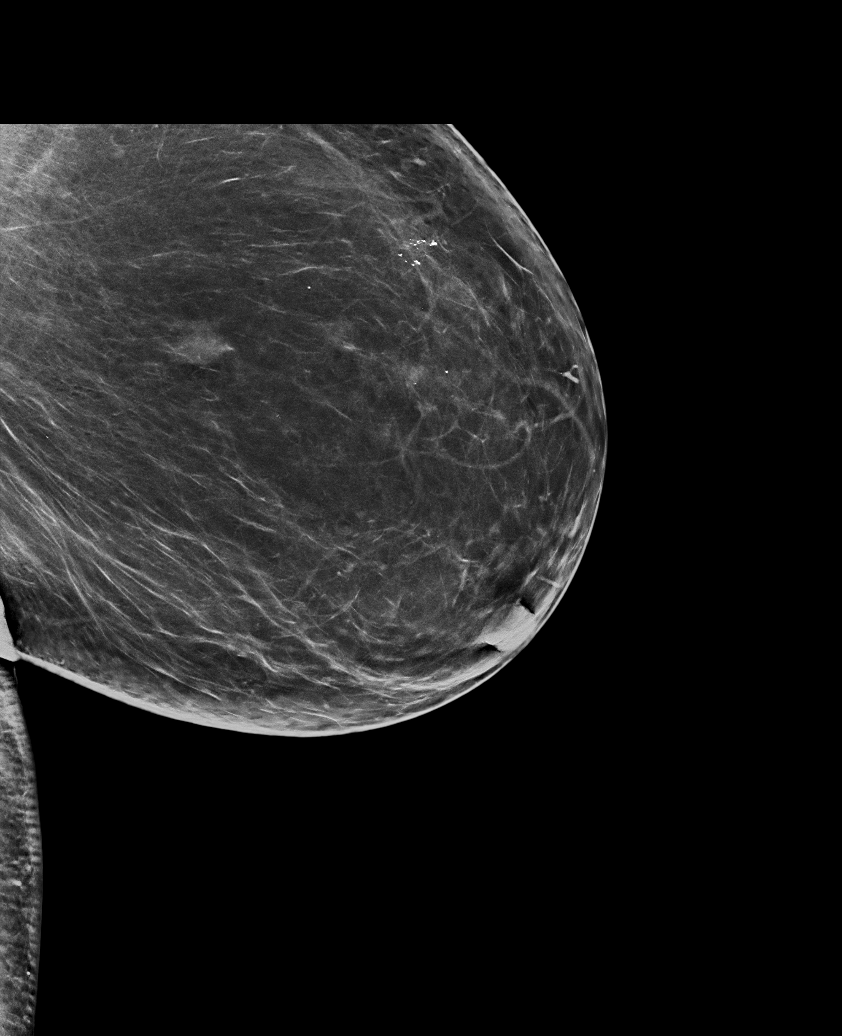

[L CC synth-2D]
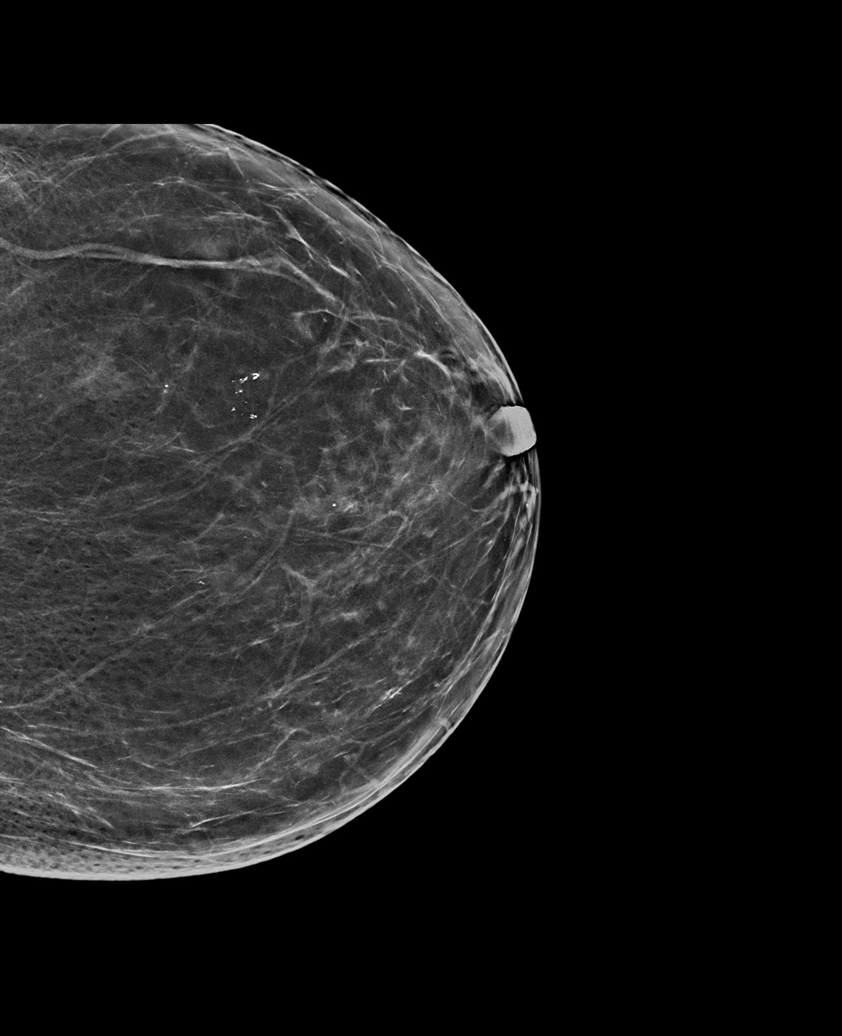

[L MLO synth-2D (2 of 2)]
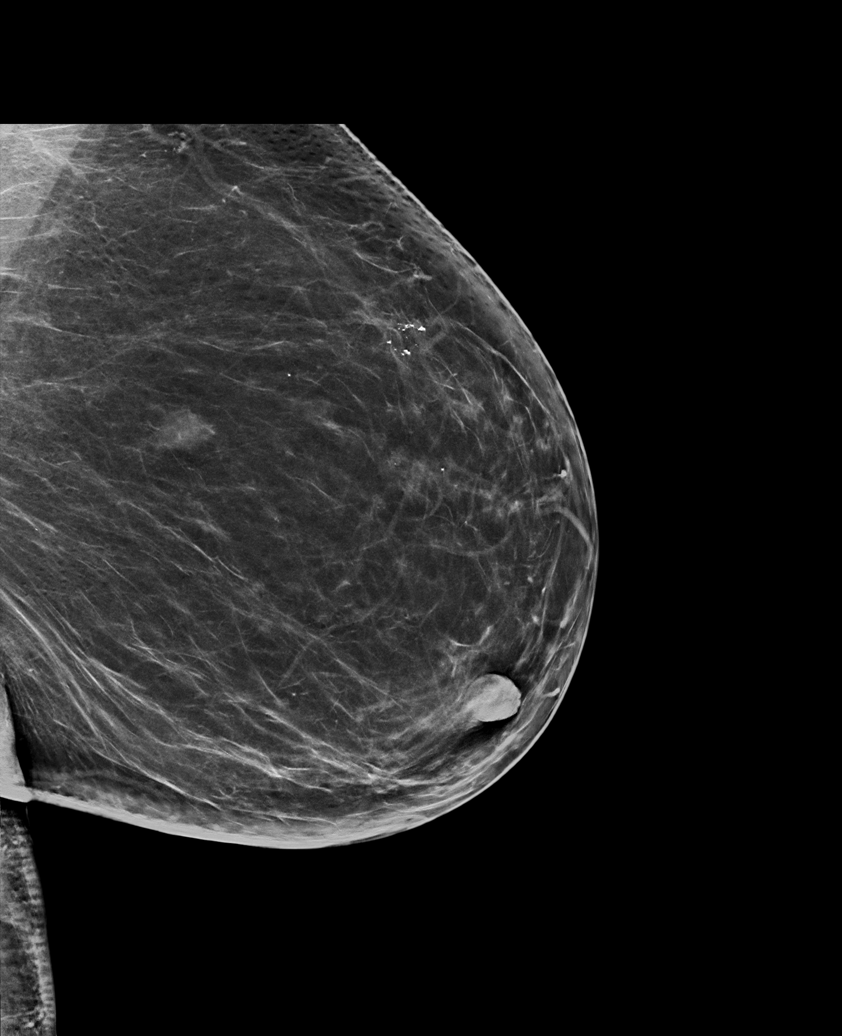

[R CC synth-2D]
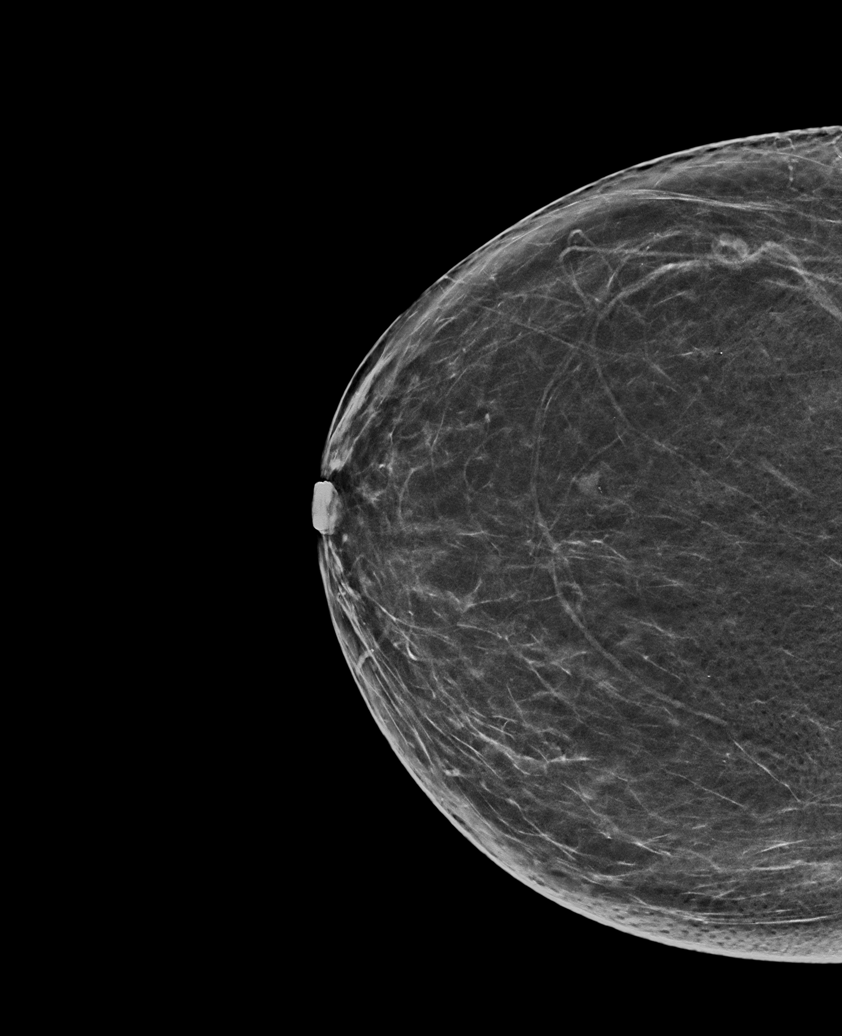

[R MLO tomo · tomo slice 41/82.0]
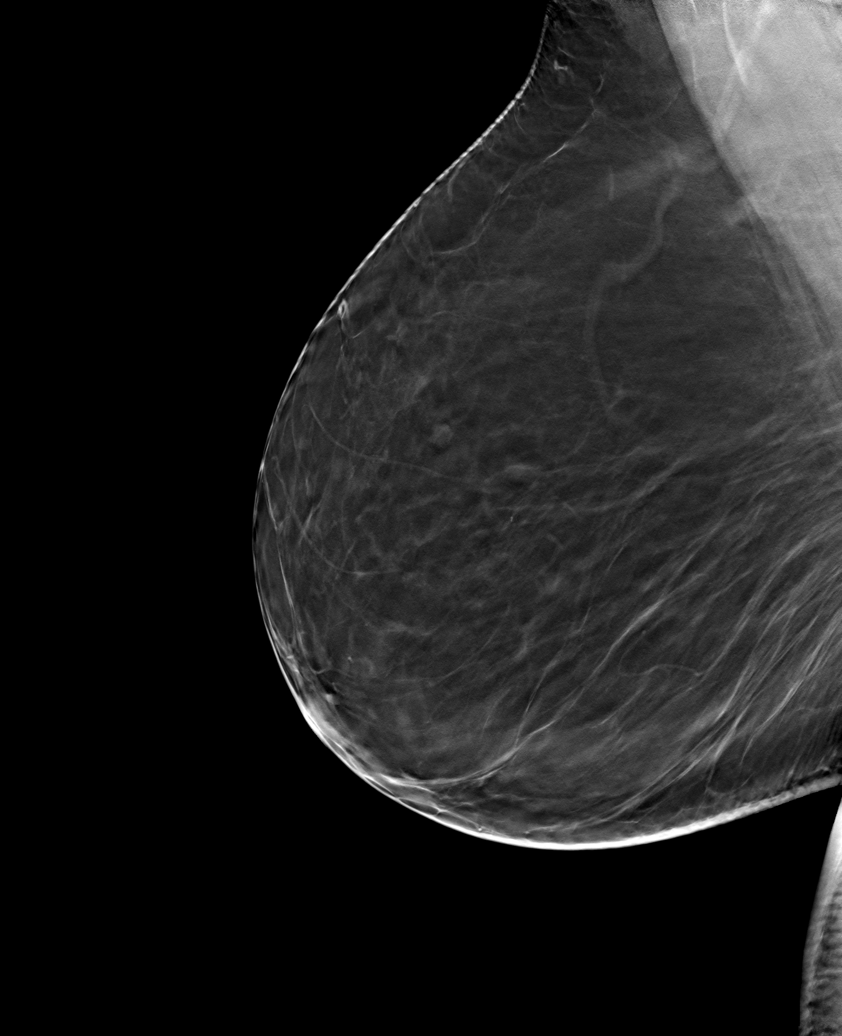

[6 of 30 positions shown; findings below may reference images not displayed]

ACR Breast Density Category b: There are scattered areas of
fibroglandular density.
FINDINGS: There are no findings suspicious for malignancy. Images were
processed with CAD.
IMPRESSION: No mammographic evidence of malignancy. A result letter of this
screening mammogram will be mailed directly to the patient.

RECOMMENDATION:
Screening mammogram in one year. (Code:[TQ])

BI-RADS CATEGORY  1: Negative.

## 2019-09-03 ENCOUNTER — Telehealth: Payer: Self-pay | Admitting: *Deleted

## 2019-09-03 NOTE — Telephone Encounter (Signed)
Patient called this morning requesting an appointment. She stated she felt she got over heated at work yesterday and was sent home. She since has started having chest pain and tightness and pain in her left arm. Patient was advised to go to ER as soon as possible. Patient was upset we could not see her due to cost of ER visit and she was informed that with her symptoms it is important she do to the hospital.

## 2019-09-03 NOTE — Telephone Encounter (Signed)
Error

## 2019-09-08 ENCOUNTER — Ambulatory Visit: Payer: BC Managed Care – PPO | Admitting: Cardiology

## 2019-09-08 ENCOUNTER — Other Ambulatory Visit: Payer: Self-pay

## 2019-09-08 ENCOUNTER — Encounter: Payer: Self-pay | Admitting: Cardiology

## 2019-09-08 VITALS — BP 148/85 | HR 85 | Ht 65.0 in | Wt 200.2 lb

## 2019-09-08 DIAGNOSIS — I10 Essential (primary) hypertension: Secondary | ICD-10-CM

## 2019-09-08 DIAGNOSIS — F419 Anxiety disorder, unspecified: Secondary | ICD-10-CM

## 2019-09-08 DIAGNOSIS — E782 Mixed hyperlipidemia: Secondary | ICD-10-CM

## 2019-09-08 NOTE — Progress Notes (Signed)
Cardiology Office Note  Date: 09/08/2019   ID: [name], [name] [birth year], MRN [id number]  PCP:  [name], MD  Cardiologist:  Rozann Lesches, MD Electrophysiologist:  None   Chief Complaint  Patient presents with  . Cardiac follow-up    History of Present Illness: Kaitlin [name] is a 64 y.o. female last seen in November 2019.  She presents overdue for follow-up.  She states that she was nervous about coming in for a check today, when she got into the office there was an overhead page for a CODE BLUE in the hospital, she became very anxious and had to walk outside to calm down.  She states that she has been struggling with anxiety for a long time, sees a behavioral health specialist.  She also has a significant amount of stress.  We have followed her over time with history of hypertension hyperlipidemia, she does have palpitations when she feels anxious, no documented history of ischemic heart disease or syncope.  I reviewed her medications which are outlined below.  She reports compliance.  Vital signs below were checked by me after talking with her for a while.  I personally reviewed her ECG today which shows sinus rhythm with increased voltage and poor R wave progression which is old.  Past Medical History:  Diagnosis Date  . Anxiety   . Colon polyp    April 2008  . Depression   . Essential hypertension   . Folliculitis 06/27/9561  . Helicobacter pylori gastritis    AUG 2008 PREVPAC: nausea-->HELIDAC: GI UPSET  . Hypothyroidism   . Irritable bowel syndrome   . Kidney stones   . Mixed hyperlipidemia   . Mixed stress and urge urinary incontinence 09/26/2012  . Obesity   . Panic attacks   . Reflux   . Tubulovillous adenoma 08/07/10   Colonoscopy w/ Dr Oneida Alar w/ 7 polyps removed-bx showed tubular adenomas.  Largest 1cm-hepatic flexure polyp->TA.      Past Surgical History:  Procedure Laterality Date  . CHOLECYSTECTOMY    . COLONOSCOPY  APR 2008   AC/[facility]  SIMPLE ADENOMA  . COLONOSCOPY  08/07/2010   Dr. Raynald Kemp adenomas and tubulovillous adenoma; needs surveillance 2015  . COLONOSCOPY N/A 09/18/2013   Dr.Rourk- normal rectum, 2 diminutive polys in the mid sigmoid segment o/w the remainder of the colonic mucosa appeared normal.hyperplastic poylps  . COLONOSCOPY WITH PROPOFOL N/A 02/23/2019   Procedure: COLONOSCOPY WITH PROPOFOL;  Surgeon: Daneil Dolin, MD;  Location: [facility];  Service: Endoscopy;  Laterality: N/A;  2:45pm - office unable to reach pt to move her up  . ESOPHAGOGASTRODUODENOSCOPY N/A 11/29/2014   RMR: Gastric erosions. Small hiatal hernia. Status post biopsy.   Marland Kitchen MASS EXCISION  02/28/2011   Procedure: EXCISION MASS;  Surgeon: Donato Heinz, MD;  Location: [facility] ORS;  Service: General;  Laterality: Right;  soft tissue mass  . POLYPECTOMY  02/23/2019   Procedure: POLYPECTOMY;  Surgeon: Daneil Dolin, MD;  Location: [facility];  Service: Endoscopy;;  . UPPER GASTROINTESTINAL ENDOSCOPY  AUG 2008 PAIN/NAUSEA   H. PYLORI treated    Current Outpatient Medications  Medication Sig Dispense Refill  . ALPRAZolam (XANAX) 1 MG tablet Take 1 tablet (1 mg total) by mouth 3 (three) times daily. 90 tablet 5  . aspirin EC 81 MG tablet Take 81 mg by mouth daily.    . blood glucose meter kit and supplies Dispense based on patient and insurance preference. Test once  a day E11.9 1 each 0  . CONTOUR TEST test strip TEST ONCE DAILY AS DIRECTED 50 strip 5  . ELDERBERRY PO Take 100 mg by mouth daily at 12 noon.    . enalapril (VASOTEC) 20 MG tablet TAKE 1 TABLET BY MOUTH EVERYDAY AT BEDTIME 90 tablet 1  . furosemide (LASIX) 20 MG tablet Take 10 mg by mouth.    . levothyroxine (SYNTHROID) 50 MCG tablet TAKE 1 TABLET BY MOUTH EVERY DAY 90 tablet 1  . lovastatin (MEVACOR) 40 MG tablet Take 1 tablet (40 mg total) by mouth at bedtime. 90 tablet 1  . Magnesium 250 MG TABS Take 250 mg by mouth daily.     . Oxcarbazepine (TRILEPTAL) 300 MG  tablet Take 300 mg by mouth 2 (two) times daily.     . pantoprazole (PROTONIX) 40 MG tablet Take 1 tablet (40 mg total) by mouth 2 (two) times daily before a meal. 180 tablet 3  . verapamil (CALAN-SR) 240 MG CR tablet Take 240 mg by mouth every morning. In the morning    . vitamin B-12 (CYANOCOBALAMIN) 500 MCG tablet Take 1,000 mcg by mouth daily.    . vitamin C (ASCORBIC ACID) 250 MG tablet Take 250 mg by mouth daily as needed.      No current facility-administered medications for this visit.   Allergies:  Augmentin [amoxicillin-pot clavulanate], Ipratropium, Levaquin [levofloxacin in d5w], Rocephin [ceftriaxone sodium in dextrose], Valtrex [valacyclovir hcl], and Zoloft [sertraline hcl]   ROS:   No syncope.  Physical Exam: VS:  BP (!) 148/85   Pulse 85   Ht '5\' 5"'$  (1.651 m)   Wt 200 lb 3.2 oz (90.8 kg)   SpO2 97%   BMI 33.32 kg/m , BMI Body mass index is 33.32 kg/m.  Wt Readings from Last 3 Encounters:  09/08/19 200 lb 3.2 oz (90.8 kg)  05/28/19 208 lb 3.2 oz (94.4 kg)  04/15/19 210 lb (95.3 kg)    General: Anxious after reported panic attack, but calmed down after discussion. HEENT: Conjunctiva and lids normal.. Neck: Supple, no elevated JVP or carotid bruits, no thyromegaly. Lungs: Clear to auscultation, nonlabored breathing at rest. Cardiac: Regular rate and rhythm, no S3 or significant systolic murmur, no pericardial rub. Extremities: No pitting edema, distal pulses 2+.  ECG:  An ECG dated 09/04/2017 was personally reviewed today and demonstrated:  Sinus rhythm with decreased R wave progression.  Recent Labwork: 04/15/2019: BUN 17; Creatinine, Ser 0.62; Hemoglobin 12.8; Platelets 251; Potassium 3.6; Sodium 131 06/05/2019: TSH 4.410     Component Value Date/Time   CHOL 186 06/05/2019 1006   TRIG 118 06/05/2019 1006   HDL 48 06/05/2019 1006   CHOLHDL 3.9 06/05/2019 1006   CHOLHDL 3.5 08/29/2018 1110   VLDL 18 02/20/2016 1135   LDLCALC 117 (H) 06/05/2019 1006   LDLCALC 94  08/29/2018 1110    Other Studies Reviewed Today:  Echocardiogram 12/12/2010: Study Conclusions  - Left ventricle: The cavity size was normal. Mild septal hypertrophy. Systolic function was normal. The estimated ejection fraction was in the range of 60% to 65%. Wall motion was normal; there were no regional wall motion abnormalities. - Aortic valve: Mildly calcified annulus. - Mitral valve: Calcified annulus. - Atrial septum: No defect or patent foramen ovale was identified.  Chest x-ray 04/15/2019: FINDINGS: The heart size and mediastinal contours are within normal limits. Both lungs are clear. The visualized skeletal structures are unremarkable.  IMPRESSION: No active disease.  Assessment and Plan:  1.  Essential hypertension, she continues on Vasotec and Calan SR.  Blood pressure elevated today, but she experienced anxiety and a panic attack as discussed above.  I have recommended that she continue to follow with her PCP for routine for surveillance.  2.  History of fairly significant anxiety and also intermittent panic attacks.  She also does report a lot of stress in her life.  She states that she follows with a behavioral health specialist.  Medications are reviewed above.  She experiences palpitations with her anxiety, no documented history of arrhythmia.  I reviewed her ECG today.  3.  Mixed hyperlipidemia, currently on Mevacor.  Last LDL was 117, this has been better controlled in the past.  Would aim to get LDL at least under 100 if possible.  Can probably accomplish this through diet and exercise without a medication change although switching to a more potent statin would be reasonable.  Keep follow-up with PCP.  Medication Adjustments/Labs and Tests Ordered: Current medicines are reviewed at length with the patient today.  Concerns regarding medicines are outlined above.   Tests Ordered: Orders Placed This Encounter  Procedures  . EKG 12-Lead     Medication Changes: No orders of the defined types were placed in this encounter.   Disposition:  Follow up 1 year in the [place] office.  Signed, Satira Sark, MD, [facility] 09/08/2019 1:43 PM    [place] [facility] [address], [place], [place] [address] Phone: [phone]; Fax: [phone]

## 2019-09-08 NOTE — Patient Instructions (Signed)
Medication Instructions:  Your physician recommends that you continue on your current medications as directed. Please refer to the Current Medication list given to you today.  *If you need a refill on your cardiac medications before your next appointment, please call your pharmacy*   Lab Work: None today If you have labs (blood work) drawn today and your tests are completely normal, you will receive your results only by: . MyChart Message (if you have MyChart) OR . A paper copy in the mail If you have any lab test that is abnormal or we need to change your treatment, we will call you to review the results.   Testing/Procedures: None today   Follow-Up: At [facility], you and your health needs are our priority.  As part of our continuing mission to provide you with exceptional heart care, we have created designated Provider Care Teams.  These Care Teams include your primary Cardiologist (physician) and Advanced Practice Providers (APPs -  Physician Assistants and Nurse Practitioners) who all work together to provide you with the care you need, when you need it.  We recommend signing up for the patient portal called "MyChart".  Sign up information is provided on this After Visit Summary.  MyChart is used to connect with patients for Virtual Visits (Telemedicine).  Patients are able to view lab/test results, encounter notes, upcoming appointments, etc.  Non-urgent messages can be sent to your provider as well.   To learn more about what you can do with MyChart, go to [email or web address].    Your next appointment:   12 month(s)  The format for your next appointment:   In Person  Provider:   Samuel McDowell, MD   Other Instructions None       Thank you for choosing [facility] !         

## 2019-09-20 DIAGNOSIS — G4733 Obstructive sleep apnea (adult) (pediatric): Secondary | ICD-10-CM | POA: Diagnosis not present

## 2019-09-30 ENCOUNTER — Encounter: Payer: Self-pay | Admitting: Family Medicine

## 2019-09-30 ENCOUNTER — Other Ambulatory Visit: Payer: Self-pay

## 2019-09-30 ENCOUNTER — Ambulatory Visit: Payer: BC Managed Care – PPO | Admitting: Family Medicine

## 2019-09-30 VITALS — BP 128/84 | HR 86 | Temp 97.6°F | Ht 65.0 in | Wt 204.8 lb

## 2019-09-30 DIAGNOSIS — F339 Major depressive disorder, recurrent, unspecified: Secondary | ICD-10-CM | POA: Diagnosis not present

## 2019-09-30 DIAGNOSIS — F419 Anxiety disorder, unspecified: Secondary | ICD-10-CM | POA: Diagnosis not present

## 2019-09-30 DIAGNOSIS — F331 Major depressive disorder, recurrent, moderate: Secondary | ICD-10-CM

## 2019-09-30 MED ORDER — CLONAZEPAM 1 MG PO TABS: 1.0000 mg | ORAL_TABLET | Freq: Two times a day (BID) | ORAL | 0 refills | Status: DC | PRN

## 2019-09-30 NOTE — Patient Instructions (Signed)
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
## 2019-10-22 ENCOUNTER — Telehealth (HOSPITAL_COMMUNITY): Payer: Self-pay | Admitting: *Deleted

## 2019-10-22 NOTE — Telephone Encounter (Signed)
New referral received form [place] for office to sch new pt appt. Staff called both numbers on file and was not able to reach patient and phone keeps ringing and there's not options for voicemail.

## 2019-10-28 ENCOUNTER — Ambulatory Visit: Payer: BC Managed Care – PPO | Admitting: Family Medicine

## 2019-10-28 ENCOUNTER — Telehealth: Payer: Self-pay | Admitting: Family Medicine

## 2019-10-28 NOTE — Telephone Encounter (Signed)
Pt called to request lab orders, states she's very fatigued, no energy, heat sensitive for 2-3 months - feels like it may be her thyroid  (notified pt of her visit today that she missed, states she didn't know, explained we tried to call to remind & didn't get an answer & was unable to leave a message)    Please advise

## 2019-10-29 NOTE — Telephone Encounter (Signed)
Is there a time today you wanted to do this or how soon? Next available end of September

## 2019-10-29 NOTE — Telephone Encounter (Signed)
Lmtc. Per Dr. Lovena Le patient needs to be seen prior to scheduling labs. She can be in person or virtual with Santiago Glad or Dr. Lovena Le next week.

## 2019-10-29 NOTE — Telephone Encounter (Signed)
Needs appt, can do video or phone if needed.  Then we can put in labs. Dr. Lovena Le

## 2019-10-30 NOTE — Telephone Encounter (Signed)
Patient scheduled for 11/03/19 with Dr. Lovena Le

## 2019-11-02 ENCOUNTER — Ambulatory Visit: Payer: BC Managed Care – PPO | Admitting: Family Medicine

## 2019-11-03 ENCOUNTER — Ambulatory Visit: Payer: BC Managed Care – PPO | Admitting: Family Medicine

## 2019-11-03 ENCOUNTER — Encounter: Payer: Self-pay | Admitting: Family Medicine

## 2019-11-03 ENCOUNTER — Other Ambulatory Visit: Payer: Self-pay

## 2019-11-03 VITALS — BP 148/70 | HR 89 | Temp 97.4°F | Ht 65.0 in | Wt 204.0 lb

## 2019-11-03 DIAGNOSIS — E538 Deficiency of other specified B group vitamins: Secondary | ICD-10-CM

## 2019-11-03 DIAGNOSIS — I10 Essential (primary) hypertension: Secondary | ICD-10-CM | POA: Diagnosis not present

## 2019-11-03 DIAGNOSIS — G4730 Sleep apnea, unspecified: Secondary | ICD-10-CM

## 2019-11-03 DIAGNOSIS — R5383 Other fatigue: Secondary | ICD-10-CM

## 2019-11-03 DIAGNOSIS — R7301 Impaired fasting glucose: Secondary | ICD-10-CM | POA: Diagnosis not present

## 2019-11-03 DIAGNOSIS — F419 Anxiety disorder, unspecified: Secondary | ICD-10-CM | POA: Diagnosis not present

## 2019-11-03 MED ORDER — CLONAZEPAM 1 MG PO TABS: 1.0000 mg | ORAL_TABLET | Freq: Every day | ORAL | 2 refills | Status: DC

## 2019-11-03 NOTE — Progress Notes (Signed)
Patient ID: Kaitlin [name], female    DOB: 14-Aug-1955, 64 y.o.   MRN: [id number]   Chief Complaint  Patient presents with  . Anxiety   Subjective:    HPI  follow up on anxiety and depression.  Pt states she feels tired all the time and would like to do some bloodwork.   Taking 1/2 xanax per day or 2x per day. Was given klonapin to taper off the xanax and onto 22m klonapin, before bed. Helping with the sleep. But still feeling very tired. gett off at 12am.  showever then snack, and falls asleep. 5-6 hrs of sleep, bc getting up at 5:30am to get her husband to work since he lost his license.  Did a sleep apnea test back and f/u with oct with sleep doctor. She does have sleep apnea. So getting up at 6;30am. Used to sleep in till 8:30am.  Cream on the small scratch near [place].  Not sure if she picked at a skin tag.  No surrounding erythema, or fever, or pain.  No drainage.  Wanting vit b12 checked.  Medical History [place] a past medical history of Anxiety, Colon polyp, Depression, Essential hypertension, Folliculitis (70/71/2197, Helicobacter pylori gastritis, Hypothyroidism, Irritable bowel syndrome, Kidney stones, Mixed hyperlipidemia, Mixed stress and urge urinary incontinence (09/26/2012), Obesity, Panic attacks, Reflux, and Tubulovillous adenoma (08/07/10).   Outpatient Encounter Medications as of 11/03/2019  Medication Sig  . ALPRAZolam (XANAX) 1 MG tablet Take 1 tablet (1 mg total) by mouth 3 (three) times daily.  .Marland Kitchenaspirin EC 81 MG tablet Take 81 mg by mouth daily.  . blood glucose meter kit and supplies Dispense based on patient and insurance preference. Test once a day E11.9  . clonazePAM (KLONOPIN) 1 MG tablet Take 1 tablet (1 mg total) by mouth at bedtime.  . CONTOUR TEST test strip TEST ONCE DAILY AS DIRECTED  . ELDERBERRY PO Take 100 mg by mouth daily at 12 noon.  . enalapril (VASOTEC) 20 MG tablet TAKE 1 TABLET BY MOUTH EVERYDAY AT BEDTIME  . furosemide  (LASIX) 20 MG tablet Take 10 mg by mouth.  . levothyroxine (SYNTHROID) 50 MCG tablet TAKE 1 TABLET BY MOUTH EVERY DAY  . lovastatin (MEVACOR) 40 MG tablet Take 1 tablet (40 mg total) by mouth at bedtime.  . Magnesium 250 MG TABS Take 250 mg by mouth daily.   . Oxcarbazepine (TRILEPTAL) 300 MG tablet Take 300 mg by mouth 2 (two) times daily.   . pantoprazole (PROTONIX) 40 MG tablet Take 1 tablet (40 mg total) by mouth 2 (two) times daily before a meal.  . verapamil (CALAN-SR) 240 MG CR tablet Take 240 mg by mouth every morning. In the morning  . vitamin B-12 (CYANOCOBALAMIN) 500 MCG tablet Take 1,000 mcg by mouth daily.  . [DISCONTINUED] clonazePAM (KLONOPIN) 1 MG tablet Take 1 tablet (1 mg total) by mouth 2 (two) times daily as needed for anxiety.  . [DISCONTINUED] vitamin C (ASCORBIC ACID) 250 MG tablet Take 250 mg by mouth daily as needed.    No facility-administered encounter medications on file as of 11/03/2019.     Review of Systems  Constitutional: Negative for chills and fever.  HENT: Negative for congestion, rhinorrhea and sore throat.   Respiratory: Negative for cough, shortness of breath and wheezing.   Cardiovascular: Negative for chest pain and leg swelling.  Gastrointestinal: Negative for abdominal pain, diarrhea, nausea and vomiting.  Genitourinary: Negative for dysuria and frequency.  Musculoskeletal: Negative for arthralgias  and back pain.  Skin: Positive for wound (navel). Negative for rash.  Neurological: Negative for dizziness, weakness and headaches.  Psychiatric/Behavioral: Positive for sleep disturbance. Negative for agitation, behavioral problems, confusion, decreased concentration, dysphoric mood, hallucinations, self-injury and suicidal ideas. The patient is nervous/anxious. The patient is not hyperactive.      Vitals BP (!) 148/70   Pulse 89   Temp (!) 97.4 F (36.3 C)   Ht 5' 5" (1.651 m)   Wt 204 lb (92.5 kg)   SpO2 97%   BMI 33.95 kg/m   Objective:    Physical Exam Vitals and nursing note reviewed.  Constitutional:      Appearance: Normal appearance.  HENT:     Head: Normocephalic and atraumatic.     Nose: Nose normal.     Mouth/Throat:     Mouth: Mucous membranes are moist.     Pharynx: Oropharynx is clear.  Eyes:     Extraocular Movements: Extraocular movements intact.     Conjunctiva/sclera: Conjunctivae normal.     Pupils: Pupils are equal, round, and reactive to light.  Cardiovascular:     Rate and Rhythm: Normal rate and regular rhythm.     Pulses: Normal pulses.     Heart sounds: Normal heart sounds.  Pulmonary:     Effort: Pulmonary effort is normal.     Breath sounds: Normal breath sounds. No wheezing, rhonchi or rales.  Abdominal:     Comments: +small scratch about 1/2 inch on lower naval area. No drainage or surrounding erythema. Has antibiotic ointment on it.  Musculoskeletal:        General: Normal range of motion.     Right lower leg: No edema.     Left lower leg: No edema.  Skin:    General: Skin is warm and dry.     Findings: No lesion or rash.  Neurological:     General: No focal deficit present.     Mental Status: She is alert and oriented to person, place, and time.  Psychiatric:        Mood and Affect: Mood normal.        Behavior: Behavior normal.        Thought Content: Thought content normal.        Judgment: Judgment normal.      Assessment and Plan   1. Essential hypertension, benign - CBC - CMP14+EGFR  2. Impaired fasting glucose - CMP14+EGFR - Hemoglobin A1c; Future  3. Vitamin B12 deficiency - B12  4. Anxiety - clonazePAM (KLONOPIN) 1 MG tablet; Take 1 tablet (1 mg total) by mouth at bedtime.  Dispense: 30 tablet; Refill: 2  5. Sleep apnea, unspecified type  6. Other fatigue - CBC - CMP14+EGFR - B12   Long discussion again regarding long term use of benzodiazepines. Discussed with her the notes from her psychiatrist in past and that she was only wanting xanax, and  not wanting to try other meds.  Pt declining psychiatry referral.  Was given in 7/21 referral. Unable to reach her per the notes. Pt not wanting to see them at this time.  Long discussion of safety concerns of taking these medications for long term. That I don't prescribe xanax for long term.  Pt needing to try an alternative for anxiety, ex: SSRI, SNRI, or buspar and pt declining. Gave tapering instructions on the remaining xanax. Pt stating not taking it daily and only taking 1/2 tab prn.  Pt given refill for klonapin 43m qhs for sleep.  Gave her instructions on how to taper off the remaining xanax.    Pt voiced understanding.   HTN- elevated today, anxious.   cont meds.   Vit b12 def- will recheck labs.  Fatigue- likely multifactorial with recent stress with court case with husband and not sleeping much due to driving her husband to work in mornings and not getting much sleep.  Should improve in 2wks when he gets his license back.  Will check labs.   Pt to f/u in 71mofor anxiety/insomnia.

## 2019-11-03 NOTE — Patient Instructions (Signed)
Taper on the xanax to 1/2 tablet daily 14 days, then take it every other day for 1 wk, then discontinue.  Take the klonapin at night if needed for sleep.

## 2019-11-04 LAB — CBC
Hematocrit: 44.6 % (ref 34.0–46.6)
Hemoglobin: 14.4 g/dL (ref 11.1–15.9)
MCH: 27.6 pg (ref 26.6–33.0)
MCHC: 32.3 g/dL (ref 31.5–35.7)
MCV: 85 fL (ref 79–97)
Platelets: 298 10*3/uL (ref 150–450)
RBC: 5.22 x10E6/uL (ref 3.77–5.28)
RDW: 12.7 % (ref 11.7–15.4)
WBC: 7.8 10*3/uL (ref 3.4–10.8)

## 2019-11-04 LAB — CMP14+EGFR
ALT: 39 IU/L — ABNORMAL HIGH (ref 0–32)
AST: 32 IU/L (ref 0–40)
Albumin/Globulin Ratio: 1.6 (ref 1.2–2.2)
Albumin: 4.6 g/dL (ref 3.8–4.8)
Alkaline Phosphatase: 115 IU/L (ref 48–121)
BUN/Creatinine Ratio: 17 (ref 12–28)
BUN: 12 mg/dL (ref 8–27)
Bilirubin Total: <0.2 mg/dL (ref 0.0–1.2)
CO2: 23 mmol/L (ref 20–29)
Calcium: 10.6 mg/dL — ABNORMAL HIGH (ref 8.7–10.3)
Chloride: 102 mmol/L (ref 96–106)
Creatinine, Ser: 0.72 mg/dL (ref 0.57–1.00)
GFR calc Af Amer: 104 mL/min/{1.73_m2} (ref >59)
GFR calc non Af Amer: 90 mL/min/{1.73_m2} (ref >59)
Globulin, Total: 2.9 g/dL (ref 1.5–4.5)
Glucose: 113 mg/dL — ABNORMAL HIGH (ref 65–99)
Potassium: 4.3 mmol/L (ref 3.5–5.2)
Sodium: 139 mmol/L (ref 134–144)
Total Protein: 7.5 g/dL (ref 6.0–8.5)

## 2019-11-04 LAB — VITAMIN B12: Vitamin B-12: 1906 pg/mL — ABNORMAL HIGH (ref 232–1245)

## 2019-11-11 ENCOUNTER — Other Ambulatory Visit: Payer: Self-pay | Admitting: *Deleted

## 2019-11-11 MED ORDER — LEVOTHYROXINE SODIUM 50 MCG PO TABS: 50.0000 ug | ORAL_TABLET | Freq: Every day | ORAL | 0 refills | Status: DC

## 2019-11-23 ENCOUNTER — Other Ambulatory Visit: Payer: Self-pay | Admitting: *Deleted

## 2019-11-23 MED ORDER — LEVOTHYROXINE SODIUM 50 MCG PO TABS: 50.0000 ug | ORAL_TABLET | Freq: Every day | ORAL | 0 refills | Status: DC

## 2019-12-16 ENCOUNTER — Other Ambulatory Visit: Payer: Self-pay

## 2019-12-16 ENCOUNTER — Ambulatory Visit: Payer: BC Managed Care – PPO | Admitting: Nurse Practitioner

## 2019-12-16 DIAGNOSIS — W57XXXA Bitten or stung by nonvenomous insect and other nonvenomous arthropods, initial encounter: Secondary | ICD-10-CM

## 2019-12-16 DIAGNOSIS — S40861A Insect bite (nonvenomous) of right upper arm, initial encounter: Secondary | ICD-10-CM

## 2019-12-16 MED ORDER — SULFAMETHOXAZOLE-TRIMETHOPRIM 800-160 MG PO TABS: 1.0000 | ORAL_TABLET | Freq: Two times a day (BID) | ORAL | 0 refills | Status: AC

## 2019-12-16 NOTE — Progress Notes (Signed)
[facility] note in file

## 2019-12-17 ENCOUNTER — Ambulatory Visit: Payer: BC Managed Care – PPO | Admitting: Nurse Practitioner

## 2019-12-17 ENCOUNTER — Other Ambulatory Visit: Payer: Self-pay

## 2019-12-17 VITALS — BP 124/72 | HR 75 | Temp 97.1°F | Resp 16

## 2019-12-17 DIAGNOSIS — S50861D Insect bite (nonvenomous) of right forearm, subsequent encounter: Secondary | ICD-10-CM

## 2019-12-17 DIAGNOSIS — W57XXXD Bitten or stung by nonvenomous insect and other nonvenomous arthropods, subsequent encounter: Secondary | ICD-10-CM

## 2019-12-23 ENCOUNTER — Telehealth: Payer: Self-pay | Admitting: Family Medicine

## 2019-12-23 MED ORDER — VERAPAMIL HCL ER 240 MG PO TBCR: 240.0000 mg | EXTENDED_RELEASE_TABLET | Freq: Every morning | ORAL | 0 refills | Status: DC

## 2019-12-23 NOTE — Telephone Encounter (Signed)
Pls give 90 tab, no refills thx. Dr. Lovena Le

## 2019-12-23 NOTE — Telephone Encounter (Signed)
[facility] refill on Verapamil 240 mg tablet. Pt last seen 11/03/19 for HTN. Please advise. Thank you

## 2019-12-23 NOTE — Telephone Encounter (Signed)
Refill sent to pharmacy.   

## 2019-12-24 DIAGNOSIS — R202 Paresthesia of skin: Secondary | ICD-10-CM | POA: Diagnosis not present

## 2019-12-24 DIAGNOSIS — G501 Atypical facial pain: Secondary | ICD-10-CM | POA: Diagnosis not present

## 2019-12-24 DIAGNOSIS — R200 Anesthesia of skin: Secondary | ICD-10-CM | POA: Diagnosis not present

## 2019-12-24 DIAGNOSIS — R0683 Snoring: Secondary | ICD-10-CM | POA: Diagnosis not present

## 2020-01-11 ENCOUNTER — Ambulatory Visit: Payer: BC Managed Care – PPO | Admitting: Medical

## 2020-01-11 ENCOUNTER — Other Ambulatory Visit: Payer: Self-pay

## 2020-01-11 ENCOUNTER — Telehealth: Payer: BC Managed Care – PPO | Admitting: Medical

## 2020-01-11 DIAGNOSIS — Z20822 Contact with and (suspected) exposure to covid-19: Secondary | ICD-10-CM

## 2020-01-11 LAB — POC COVID19 BINAXNOW: SARS Coronavirus 2 Ag: NEGATIVE

## 2020-01-11 NOTE — Progress Notes (Signed)
Pt presents today for rapid covid 19 antigen testing; virtual visit with Stacy Gardner, PAC

## 2020-01-11 NOTE — Progress Notes (Signed)
Subjective:    Patient ID: Kaitlin [name], female    DOB: [birth year], 64 y.o.   MRN: [id number]  HPI 64 yo in non acute female.    Started yesterday.Congestinon , sneezing,drainage, phelgm is clear.   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 Taking an antihistimine, OTC   Has had fever and chills.  >  Review of Systems  Constitutional: Positive for chills and fever.  HENT: Positive for congestion, sinus pain and sneezing. Negative for sore throat.   Respiratory: Positive for chest tightness.   Cardiovascular: Positive for chest pain.  Gastrointestinal: Positive for diarrhea and nausea (resolved).  Genitourinary: Negative for frequency, hematuria and vaginal discharge (no itching).  Musculoskeletal: Negative for myalgias.  Skin: Negative for rash.  Neurological: Positive for headaches. Negative for dizziness, syncope and light-headedness.       Objective:   Physical Exam Vitals and nursing note reviewed.  Constitutional:      Appearance: Normal appearance.  HENT:     Head: Normocephalic and atraumatic.  Eyes:     Extraocular Movements: Extraocular movements intact.     Conjunctiva/sclera: Conjunctivae normal.     Pupils: Pupils are equal, round, and reactive to light.  Cardiovascular:     Rate and Rhythm: Normal rate and regular rhythm.     Heart sounds: Normal heart sounds.  Pulmonary:     Effort: Pulmonary effort is normal.     Breath sounds: Normal breath sounds.  Musculoskeletal:        General: Normal range of motion.     Cervical back: Normal range of motion.  Skin:    General: Skin is warm and dry.     Capillary Refill: Capillary refill takes less than 2 seconds.  Neurological:     General: No focal deficit present.     Mental Status: She is alert and oriented to person, place, and time. Mental status is at baseline.  Psychiatric:        Mood and Affect: Mood normal.        Behavior: Behavior normal.        Thought Content: Thought content normal.         Judgment: Judgment normal.           Recent Results (from the past 2160 hour(s))  POC COVID-19     Status: Normal   Collection Time: 01/11/20 12:44 PM  Result Value Ref Range   SARS Coronavirus 2 Ag Negative Negative  CBC     Status: None   Collection Time: 02/08/20 10:03 AM  Result Value Ref Range   WBC 8.0 3.4 - 10.8 x10E3/uL   RBC 4.95 3.77 - 5.28 x10E6/uL   Hemoglobin 14.1 11.1 - 15.9 g/dL   Hematocrit 41.7 34.0 - 46.6 %   MCV 84 79 - 97 fL   MCH 28.5 26.6 - 33.0 pg   MCHC 33.8 31.5 - 35.7 g/dL   RDW 12.6 11.7 - 15.4 %   Platelets 282 150 - 450 x10E3/uL  CMP14+EGFR     Status: Abnormal   Collection Time: 02/08/20 10:03 AM  Result Value Ref Range   Glucose 116 (H) 65 - 99 mg/dL   BUN 11 8 - 27 mg/dL   Creatinine, Ser 0.65 0.57 - 1.00 mg/dL   GFR calc non Af Amer 95 >59 mL/min/1.73   GFR calc Af Amer 110 >59 mL/min/1.73    Comment: **In accordance with recommendations from the NKF-ASN Task force,**   [facility] is in the process of updating  its eGFR calculation to the   2021 CKD-EPI creatinine equation that estimates kidney function   without a race variable.    BUN/Creatinine Ratio 17 12 - 28   Sodium 139 134 - 144 mmol/L   Potassium 4.7 3.5 - 5.2 mmol/L   Chloride 103 96 - 106 mmol/L   CO2 22 20 - 29 mmol/L   Calcium 10.9 (H) 8.7 - 10.3 mg/dL   Total Protein 7.3 6.0 - 8.5 g/dL   Albumin 4.6 3.8 - 4.8 g/dL   Globulin, Total 2.7 1.5 - 4.5 g/dL   Albumin/Globulin Ratio 1.7 1.2 - 2.2   Bilirubin Total 0.2 0.0 - 1.2 mg/dL   Alkaline Phosphatase 96 44 - 121 IU/L    Comment:               **Please note reference interval change**   AST 35 0 - 40 IU/L   ALT 40 (H) 0 - 32 IU/L  Lipid panel     Status: Abnormal   Collection Time: 02/08/20 10:03 AM  Result Value Ref Range   Cholesterol, Total 183 100 - 199 mg/dL   Triglycerides 97 0 - 149 mg/dL   HDL 55 >30 mg/dL   VLDL Cholesterol Cal 18 5 - 40 mg/dL   LDL Chol Calc (NIH) 856 (H) 0 - 99 mg/dL   Chol/HDL Ratio 3.3  0.0 - 4.4 ratio    Comment:                                   T. Chol/HDL Ratio                                             Men  Women                               1/2 Avg.Risk  3.4    3.3                                   Avg.Risk  5.0    4.4                                2X Avg.Risk  9.6    7.1                                3X Avg.Risk 23.4   11.0   TSH     Status: None   Collection Time: 02/08/20 10:03 AM  Result Value Ref Range   TSH 2.160 0.450 - 4.500 uIU/mL  Parathyroid hormone, intact (no Ca)     Status: None   Collection Time: 02/08/20 10:03 AM  Result Value Ref Range   PTH 42 15 - 65 pg/mL  Specimen status report     Status: None   Collection Time: 02/08/20 10:03 AM  Result Value Ref Range   specimen status report Comment     Comment: Verbal Order See below: Comment: Please provide requested information and fax to [phone] or [phone]. The [place] Code of [employer] requires a  written and signed request be forwarded to a laboratory following a verbal order of a laboratory test.  Please assist [name] to meet this requirement and to complete our records. Date:______________________________ ICD-9/10 Diagnosis Code(s):___________________________________________ Physician or Authorized Designee:_____________________________________                                               Please Print Physician or Authorized Designee Signature: ______________________________________________________________________ Your Animal nutritionist Your Order Of The Test(s) Listed   Protein electrophoresis     Status: None   Collection Time: 02/08/20 10:03 AM  Result Value Ref Range   Total Protein 7.4 6.0 - 8.5 g/dL   Albumin ELP 4.0 2.9 - 4.4 g/dL   Alpha 1 0.2 0.0 - 0.4 g/dL   Alpha 2 0.9 0.4 - 1.0 g/dL   Beta 1.2 0.7 - 1.3 g/dL   Gamma Globulin 1.0 0.4 - 1.8 g/dL   M-Spike, % Not Observed Not Observed g/dL   Globulin, Total 3.4 2.2 - 3.9 g/dL   A/G Ratio  1.2 0.7 - 1.7   Please Note: Comment     Comment: Protein electrophoresis scan will follow via computer, mail, or courier delivery.   VITAMIN D 25 Hydroxy (Vit-D Deficiency, Fractures)     Status: Abnormal   Collection Time: 02/08/20 10:03 AM  Result Value Ref Range   Vit D, 25-Hydroxy 16.7 (L) 30.0 - 100.0 ng/mL    Comment: Vitamin D deficiency has been defined by the [place] practice guideline as a level of serum 25-OH vitamin D less than 20 ng/mL (1,2). The Endocrine Society went on to further define vitamin D insufficiency as a level between 21 and 29 ng/mL (2). 1. IOM (Institute of Medicine). 2010. Dietary reference    intakes for calcium and D. [place]: The    [employer]. 2. Holick MF, Binkley [place], Bischoff-Ferrari HA, et al.    Evaluation, treatment, and prevention of vitamin D    deficiency: an Endocrine Society clinical practice    guideline. JCEM. 2011 Jul; 96(7):1911-30.   Specimen status report     Status: None   Collection Time: 02/08/20 10:03 AM  Result Value Ref Range   specimen status report Comment     Comment: Written Authorization Written Authorization Written Authorization Received. Authorization received from [facility] 02-11-2020 Logged by Crystal Ward    Assessment & Plan:  Viral illness Rest, increase fluids, OTC Motrin or Tylenol per package instructions. No orders of the defined types were placed in this encounter. Return if worsening or not improving in 3-7 days, follow up here or with your PCP. Patient verbalizes understandfing and has no questions at discharge.

## 2020-01-15 ENCOUNTER — Other Ambulatory Visit: Payer: Self-pay

## 2020-01-15 ENCOUNTER — Telehealth: Payer: BC Managed Care – PPO | Admitting: Medical

## 2020-01-15 DIAGNOSIS — R059 Cough, unspecified: Secondary | ICD-10-CM

## 2020-01-15 DIAGNOSIS — J069 Acute upper respiratory infection, unspecified: Secondary | ICD-10-CM

## 2020-01-15 MED ORDER — AZITHROMYCIN 250 MG PO TABS: ORAL_TABLET | ORAL | 0 refills | Status: DC

## 2020-01-15 NOTE — Patient Instructions (Signed)

## 2020-01-15 NOTE — Progress Notes (Signed)
Subjective:    Patient ID: Kaitlin [name], female    DOB: 07-14-1955, 64 y.o.   MRN: [id number]  HPI 64 yo female in non acute distress. Consents to telemedicine appointment. Called patient no answer 12:42pm. Left message. Patient returned my phone call. I spoke with patient on Monday. She  worked Tiuesday, Wed, Thursday she started coughing again clear productive.  Allergies  Allergen Reactions   Augmentin [Amoxicillin-Pot Clavulanate] Diarrhea   Ipratropium Other (See Comments)    Causes blood pressure to go up   Levaquin [Levofloxacin In D5w] Other (See Comments)    chestpain   Rocephin [Ceftriaxone Sodium In Dextrose] Hives and Other (See Comments)    blisters   Valtrex [Valacyclovir Hcl] Hives and Other (See Comments)   Zoloft [Sertraline Hcl] Other (See Comments)    Too strong   Past Medical History:  Diagnosis Date   Anxiety    Colon polyp    April 2008   Depression    Essential hypertension    Folliculitis 7/35/3299   Helicobacter pylori gastritis    AUG 2008 PREVPAC: nausea-->HELIDAC: GI UPSET   Hypothyroidism    Irritable bowel syndrome    Kidney stones    Mixed hyperlipidemia    Mixed stress and urge urinary incontinence 09/26/2012   Obesity    Panic attacks    Reflux    Tubulovillous adenoma 08/07/10   Colonoscopy w/ Dr Oneida Alar w/ 7 polyps removed-bx showed tubular adenomas.  Largest 1cm-hepatic flexure polyp->TA.      Current Outpatient Medications:    ALPRAZolam (XANAX) 1 MG tablet, Take 1 tablet (1 mg total) by mouth 3 (three) times daily., Disp: 90 tablet, Rfl: 5   aspirin EC 81 MG tablet, Take 81 mg by mouth daily., Disp: , Rfl:    azithromycin (ZITHROMAX) 250 MG tablet, Take 2 tablets by mouth today then one tablet days 2-5 , take with food, Disp: 6 tablet, Rfl: 0   blood glucose meter kit and supplies, Dispense based on patient and insurance preference. Test once a day E11.9, Disp: 1 each, Rfl: 0   clonazePAM (KLONOPIN) 1  MG tablet, Take 1 tablet (1 mg total) by mouth at bedtime., Disp: 30 tablet, Rfl: 2   CONTOUR TEST test strip, TEST ONCE DAILY AS DIRECTED, Disp: 50 strip, Rfl: 5   ELDERBERRY PO, Take 100 mg by mouth daily at 12 noon., Disp: , Rfl:    enalapril (VASOTEC) 20 MG tablet, TAKE 1 TABLET BY MOUTH EVERYDAY AT BEDTIME, Disp: 90 tablet, Rfl: 1   furosemide (LASIX) 20 MG tablet, Take 10 mg by mouth., Disp: , Rfl:    levothyroxine (SYNTHROID) 50 MCG tablet, Take 1 tablet (50 mcg total) by mouth daily., Disp: 90 tablet, Rfl: 0   lovastatin (MEVACOR) 40 MG tablet, Take 1 tablet (40 mg total) by mouth at bedtime., Disp: 90 tablet, Rfl: 1   Magnesium 250 MG TABS, Take 250 mg by mouth daily. , Disp: , Rfl:    Oxcarbazepine (TRILEPTAL) 300 MG tablet, Take 300 mg by mouth 2 (two) times daily. , Disp: , Rfl:    pantoprazole (PROTONIX) 40 MG tablet, Take 1 tablet (40 mg total) by mouth 2 (two) times daily before a meal., Disp: 180 tablet, Rfl: 3   verapamil (CALAN-SR) 240 MG CR tablet, Take 1 tablet (240 mg total) by mouth every morning. In the morning, Disp: 90 tablet, Rfl: 0   vitamin B-12 (CYANOCOBALAMIN) 500 MCG tablet, Take 1,000 mcg by mouth daily., Disp: ,  Rfl:   Review of Systems  Constitutional: Negative for chills (with the weather change) and fever.  HENT: Positive for congestion, sneezing and sore throat.   Respiratory: Positive for cough (non productive). Negative for wheezing.   Cardiovascular: Negative for chest pain.  Musculoskeletal: Positive for back pain.  Allergic/Immunologic: Positive for environmental allergies (after the first of the year she says she is going to start allergy injections).  Neurological: Negative.        Objective:   Physical Exam AXOX3 Speaks in full sentences Mild cough one time on phone call.  Clearing throat on call.    Assessment & Plan:  URI Cough  OTC Delsym per package instructions for cough Meds ordered this encounter  Medications    azithromycin (ZITHROMAX) 250 MG tablet    Sig: Take 2 tablets by mouth today then one tablet days 2-5 , take with food    Dispense:  6 tablet    Refill:  [facility] patient. Patient to follow up with her doctor if not improving in 3-5 days. If worsening over the weekend she is to seek out medical care at an urgent care like [facility] and [employer] or the Emergency Department.,[place] Patient verbalizes understanding and has no questions at the end of our conversation.

## 2020-01-22 ENCOUNTER — Ambulatory Visit: Payer: BC Managed Care – PPO | Attending: Internal Medicine

## 2020-01-22 DIAGNOSIS — G4733 Obstructive sleep apnea (adult) (pediatric): Secondary | ICD-10-CM | POA: Diagnosis not present

## 2020-01-23 ENCOUNTER — Other Ambulatory Visit: Payer: Self-pay | Admitting: Family Medicine

## 2020-01-25 ENCOUNTER — Other Ambulatory Visit: Payer: Self-pay

## 2020-01-27 ENCOUNTER — Other Ambulatory Visit: Payer: Self-pay | Admitting: *Deleted

## 2020-01-27 MED ORDER — LOVASTATIN 40 MG PO TABS
40.0000 mg | ORAL_TABLET | Freq: Every day | ORAL | 0 refills | Status: DC
Start: 2020-01-27 — End: 2020-02-08

## 2020-01-27 MED ORDER — ENALAPRIL MALEATE 20 MG PO TABS
ORAL_TABLET | ORAL | 0 refills | Status: DC
Start: 2020-01-27 — End: 2020-02-08

## 2020-01-30 DIAGNOSIS — G4730 Sleep apnea, unspecified: Secondary | ICD-10-CM | POA: Diagnosis not present

## 2020-02-02 ENCOUNTER — Ambulatory Visit: Payer: BC Managed Care – PPO | Admitting: Family Medicine

## 2020-02-02 DIAGNOSIS — Z23 Encounter for immunization: Secondary | ICD-10-CM | POA: Diagnosis not present

## 2020-02-08 ENCOUNTER — Ambulatory Visit: Payer: BC Managed Care – PPO | Admitting: Family Medicine

## 2020-02-08 ENCOUNTER — Other Ambulatory Visit: Payer: Self-pay

## 2020-02-08 ENCOUNTER — Encounter: Payer: Self-pay | Admitting: Family Medicine

## 2020-02-08 ENCOUNTER — Ambulatory Visit (HOSPITAL_COMMUNITY)
Admission: RE | Admit: 2020-02-08 | Discharge: 2020-02-08 | Disposition: A | Discharge status: Home / Self Care | Payer: BC Managed Care – PPO | Source: Ambulatory Visit | Attending: Family Medicine | Admitting: Family Medicine

## 2020-02-08 VITALS — BP 129/84 | HR 97 | Temp 97.7°F | Ht 65.0 in | Wt 205.4 lb

## 2020-02-08 DIAGNOSIS — E039 Hypothyroidism, unspecified: Secondary | ICD-10-CM | POA: Diagnosis not present

## 2020-02-08 DIAGNOSIS — M25551 Pain in right hip: Secondary | ICD-10-CM | POA: Diagnosis not present

## 2020-02-08 DIAGNOSIS — F339 Major depressive disorder, recurrent, unspecified: Secondary | ICD-10-CM

## 2020-02-08 DIAGNOSIS — I10 Essential (primary) hypertension: Secondary | ICD-10-CM

## 2020-02-08 IMAGING — DX DG HIP (WITH OR WITHOUT PELVIS) 2-3V*R*
3 series · 3 of 3 positions shown · non-contrast
Comparison: [DATE]

CLINICAL DATA: Chronic right hip pain

EXAM:
DG HIP (WITH OR WITHOUT PELVIS) 2-3V RIGHT

[pelvis ap]
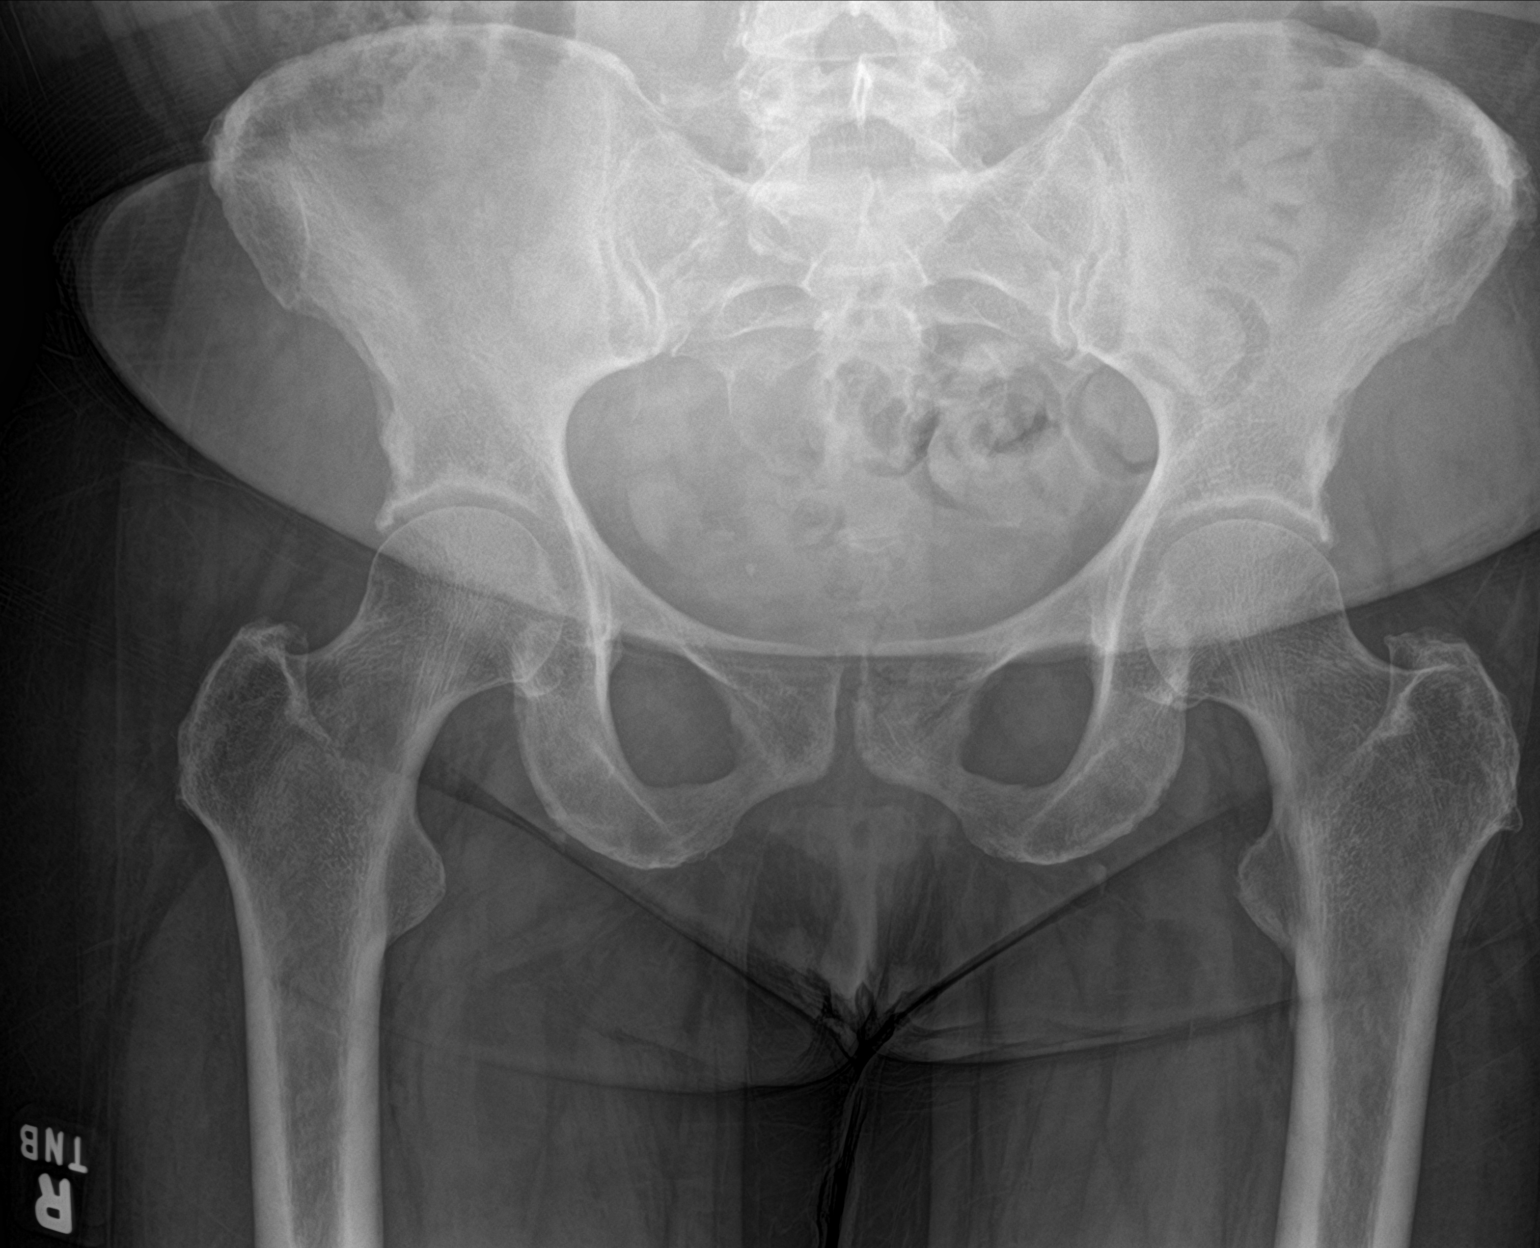

[hip ap]
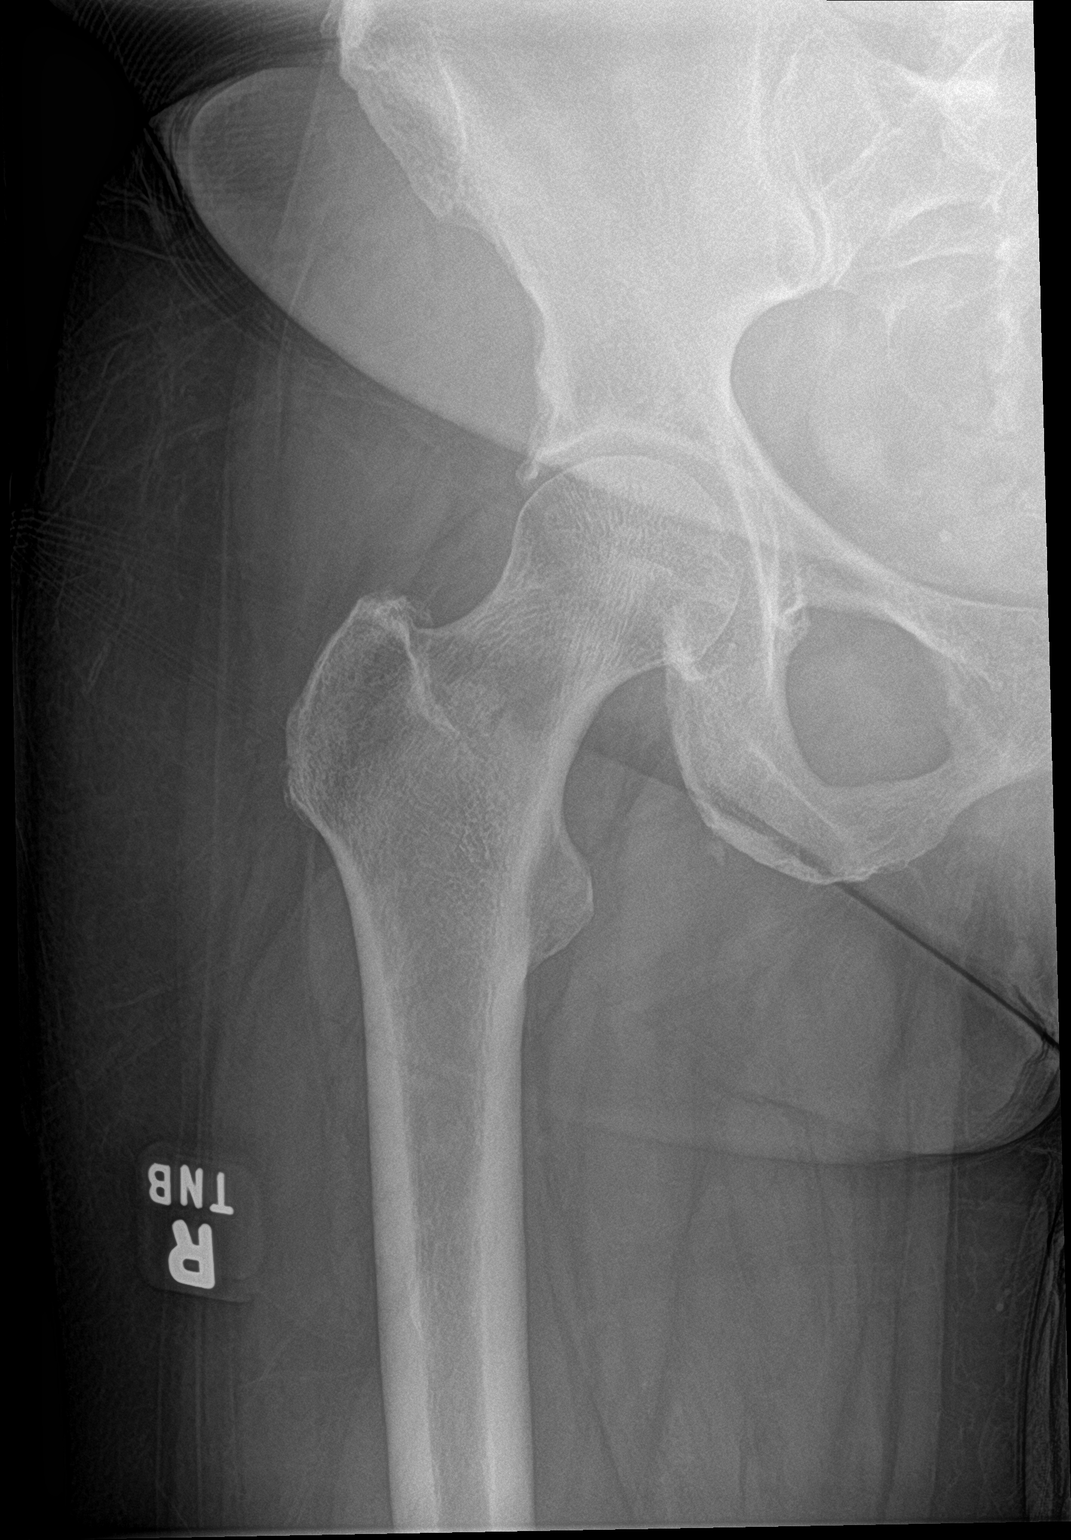

[hip lat]
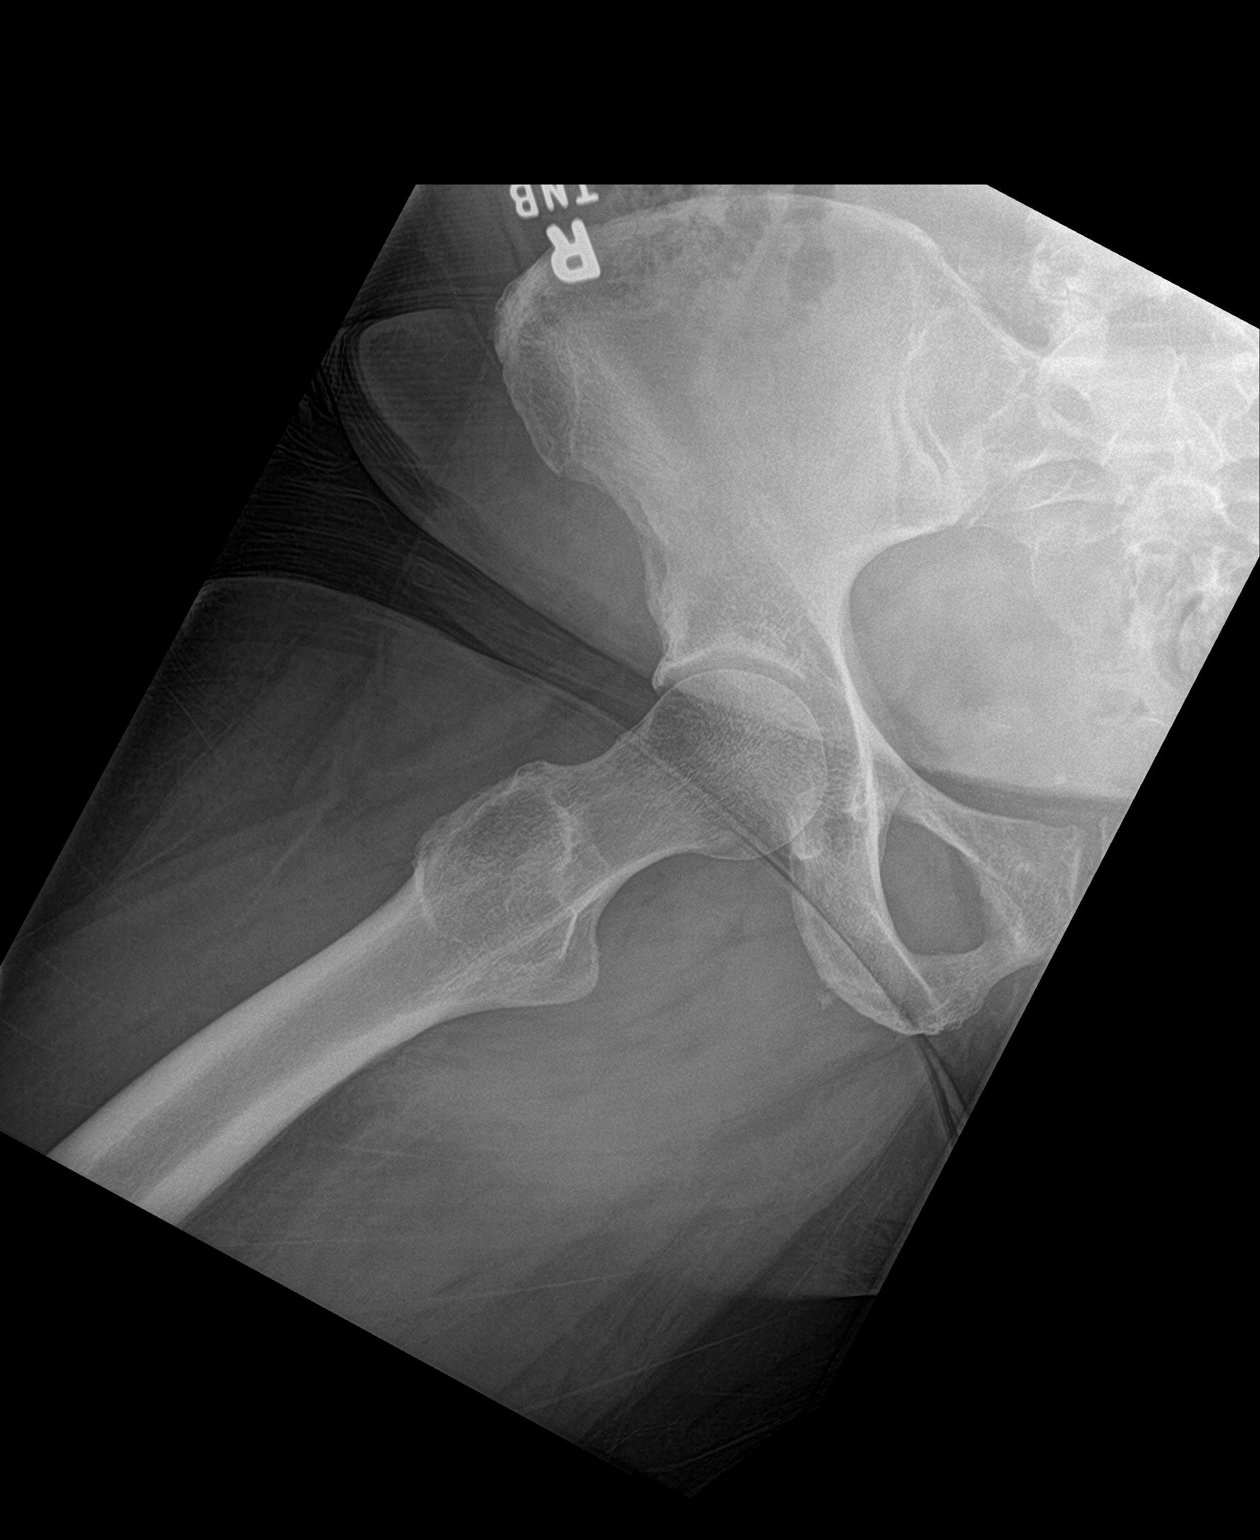

[3 of 3 positions shown; findings below may reference images not displayed]

FINDINGS: No joint space narrowing on either side. No significant osteophyte
formation. No fracture or other focal finding. Symphysis pubis and
sacroiliac joints appear normal. Very similar appearance to the
study of [Z1].
IMPRESSION: Negative radiographs.

## 2020-02-08 MED ORDER — VERAPAMIL HCL ER 240 MG PO TBCR
240.0000 mg | EXTENDED_RELEASE_TABLET | Freq: Every morning | ORAL | 1 refills | Status: DC
Start: 2020-02-08 — End: 2020-08-29

## 2020-02-08 MED ORDER — TRAMADOL HCL 50 MG PO TABS: 50.0000 mg | ORAL_TABLET | Freq: Three times a day (TID) | ORAL | 0 refills | Status: AC | PRN

## 2020-02-08 MED ORDER — LOVASTATIN 40 MG PO TABS
40.0000 mg | ORAL_TABLET | Freq: Every day | ORAL | 1 refills | Status: DC
Start: 2020-02-08 — End: 2020-09-09

## 2020-02-08 MED ORDER — ENALAPRIL MALEATE 20 MG PO TABS
ORAL_TABLET | ORAL | 1 refills | Status: DC
Start: 2020-02-08 — End: 2020-08-15

## 2020-02-08 MED ORDER — LEVOTHYROXINE SODIUM 50 MCG PO TABS
50.0000 ug | ORAL_TABLET | Freq: Every day | ORAL | 1 refills | Status: DC
Start: 2020-02-08 — End: 2020-07-08

## 2020-02-08 NOTE — Progress Notes (Signed)
Patient ID: Kaitlin [name], female    DOB: 1955/11/24, 64 y.o.   MRN: [id number]   Chief Complaint  Patient presents with   Hyperlipidemia    right hip pain   Subjective:   HPI   Pt seen for f/u on HLD, htn and hypothyroid.  Pt stating having right hip pain going on for months. Intermittent pain.  No fall or new injury.  Taking tylenol and had covid booster a few weeks.  Limping.  Pain going on for months.   Having some incontinence.  Never seen urologist.  Seeing Dr. Elonda Husky, gyn for this.   Laying down and on the rt hip is worse.   Anxiety- pt stating having some dreams.  Just taking at night.  occ taking it before work if feeling stress.  No grogginess when waking up. Pt also bringing up wanting to restart xanax.  Medical History Kaitlin [name] has a past medical history of Anxiety, Colon polyp, Depression, Essential hypertension, Folliculitis (06/26/5359), Helicobacter pylori gastritis, Hypothyroidism, Irritable bowel syndrome, Kidney stones, Mixed hyperlipidemia, Mixed stress and urge urinary incontinence (09/26/2012), Obesity, Panic attacks, Reflux, and Tubulovillous adenoma (08/07/10).   Outpatient Encounter Medications as of 02/08/2020  Medication Sig   aspirin EC 81 MG tablet Take 81 mg by mouth daily.   blood glucose meter kit and supplies Dispense based on patient and insurance preference. Test once a day E11.9   clonazePAM (KLONOPIN) 1 MG tablet Take 1 tablet (1 mg total) by mouth at bedtime.   CONTOUR TEST test strip TEST ONCE DAILY AS DIRECTED   ELDERBERRY PO Take 100 mg by mouth daily at 12 noon.   enalapril (VASOTEC) 20 MG tablet TAKE 1 TABLET BY MOUTH EVERYDAY AT BEDTIME   furosemide (LASIX) 20 MG tablet Take 10 mg by mouth.   levothyroxine (SYNTHROID) 50 MCG tablet Take 1 tablet (50 mcg total) by mouth daily.   lovastatin (MEVACOR) 40 MG tablet Take 1 tablet (40 mg total) by mouth at bedtime.   Magnesium 250 MG TABS Take 250 mg by mouth daily.     Oxcarbazepine (TRILEPTAL) 300 MG tablet Take 300 mg by mouth 2 (two) times daily.    pantoprazole (PROTONIX) 40 MG tablet Take 1 tablet (40 mg total) by mouth 2 (two) times daily before a meal.   traMADol (ULTRAM) 50 MG tablet Take 1 tablet (50 mg total) by mouth every 8 (eight) hours as needed for up to 5 days.   verapamil (CALAN-SR) 240 MG CR tablet Take 1 tablet (240 mg total) by mouth every morning. In the morning   vitamin B-12 (CYANOCOBALAMIN) 500 MCG tablet Take 1,000 mcg by mouth daily.   [DISCONTINUED] ALPRAZolam (XANAX) 1 MG tablet Take 1 tablet (1 mg total) by mouth 3 (three) times daily. (Patient not taking: Reported on 02/08/2020)   [DISCONTINUED] azithromycin (ZITHROMAX) 250 MG tablet Take 2 tablets by mouth today then one tablet days 2-5 , take with food   [DISCONTINUED] enalapril (VASOTEC) 20 MG tablet TAKE 1 TABLET BY MOUTH EVERYDAY AT BEDTIME   [DISCONTINUED] levothyroxine (SYNTHROID) 50 MCG tablet TAKE 1 TABLET BY MOUTH EVERY DAY   [DISCONTINUED] lovastatin (MEVACOR) 40 MG tablet Take 1 tablet (40 mg total) by mouth at bedtime.   [DISCONTINUED] verapamil (CALAN-SR) 240 MG CR tablet Take 1 tablet (240 mg total) by mouth every morning. In the morning   No facility-administered encounter medications on file as of 02/08/2020.     Review of Systems  Constitutional: Negative for chills and fever.  HENT: Negative for congestion, rhinorrhea and sore throat.   Respiratory: Negative for cough, shortness of breath and wheezing.   Cardiovascular: Negative for chest pain and leg swelling.  Gastrointestinal: Negative for abdominal pain, diarrhea, nausea and vomiting.  Genitourinary: Negative for dysuria and frequency.       +incontinence.  Musculoskeletal: Negative for arthralgias (rt hip pain) and back pain.  Skin: Negative for rash.  Neurological: Negative for dizziness, weakness and headaches.  Psychiatric/Behavioral: Positive for sleep disturbance. Negative for agitation,  behavioral problems, confusion, dysphoric mood, hallucinations, self-injury and suicidal ideas. The patient is not nervous/anxious.      Vitals BP 129/84    Pulse 97    Temp 97.7 F (36.5 C) (Oral)    Ht $R'5\' 5"'BD$  (1.651 m)    Wt 205 lb 6.4 oz (93.2 kg)    SpO2 99%    BMI 34.18 kg/m   Objective:   Physical Exam Constitutional:      General: She is not in acute distress.    Appearance: Normal appearance. She is not ill-appearing.  Musculoskeletal:        General: Tenderness present. No swelling, deformity or signs of injury.     Right lower leg: No edema.     Left lower leg: No edema.     Comments: +pain with external rotation of rt hip.  +ttp over SI joint on right. No ttp over lumbar or thoracic spinous process. Normal knee and foot rom.  Skin:    General: Skin is warm and dry.     Findings: No rash.  Neurological:     General: No focal deficit present.     Mental Status: She is alert and oriented to person, place, and time.     Cranial Nerves: No cranial nerve deficit.  Psychiatric:        Mood and Affect: Mood normal.        Behavior: Behavior normal.      Assessment and Plan   1. Essential hypertension, benign - CBC - CMP14+EGFR - Lipid panel  2. Hypothyroidism, unspecified type - TSH  3. Hypercalcemia - Parathyroid hormone, intact (no Ca)  4. Major depression, recurrent, chronic ([place])  5. Right hip pain - DG Hip Unilat W OR W/O Pelvis Min 4 Views Right    Rt hip pain-  Xray ordered. Need xray of rt hip.  Gave tramadol prn for pain.    Anxiety- not under good control per her PHQ9.  In room, however pt stating she is not feeling down, depressed, or anxious. Discussed reasons for not restarting xanax again.  Discussed the safety concerns and long term side effects.  Pt declining taking SSRI to help manage anxiety overall.  Pt was given klonopin to see if would help with her anxiety.  It doesn't work the same as the xanax.   Declined at this time to restart  the xanax, Pt decided she will stay on klonapin. Last fill was 11/03/19.  Pt also received $RemoveBefor'1mg'lpzQXZxYNkpB$  xanax 90 tab in 6/21.  Pt was tapered off this medication and started on klonapin. Pt has 2 refills at pharmacy for the [facility].  Pt given option for psychiatry referral, If pt wishes to restart xanax we can refer her to psychiatry,  pt declining.  Pt would like to stay on the klonapin instead.  Pt to f/u with gyn for incontinece issues. Pt stating she is taking a bladder medication from them.  F/u 67mo or prn.

## 2020-02-09 LAB — CBC
Hematocrit: 41.7 % (ref 34.0–46.6)
Hemoglobin: 14.1 g/dL (ref 11.1–15.9)
MCH: 28.5 pg (ref 26.6–33.0)
MCHC: 33.8 g/dL (ref 31.5–35.7)
MCV: 84 fL (ref 79–97)
Platelets: 282 10*3/uL (ref 150–450)
RBC: 4.95 x10E6/uL (ref 3.77–5.28)
RDW: 12.6 % (ref 11.7–15.4)
WBC: 8 10*3/uL (ref 3.4–10.8)

## 2020-02-09 LAB — LIPID PANEL
Chol/HDL Ratio: 3.3 ratio (ref 0.0–4.4)
Cholesterol, Total: 183 mg/dL (ref 100–199)
HDL: 55 mg/dL (ref >39)
LDL Chol Calc (NIH): 110 mg/dL — ABNORMAL HIGH (ref 0–99)
Triglycerides: 97 mg/dL (ref 0–149)
VLDL Cholesterol Cal: 18 mg/dL (ref 5–40)

## 2020-02-09 LAB — CMP14+EGFR
ALT: 40 IU/L — ABNORMAL HIGH (ref 0–32)
AST: 35 IU/L (ref 0–40)
Albumin/Globulin Ratio: 1.7 (ref 1.2–2.2)
Albumin: 4.6 g/dL (ref 3.8–4.8)
Alkaline Phosphatase: 96 IU/L (ref 44–121)
BUN/Creatinine Ratio: 17 (ref 12–28)
BUN: 11 mg/dL (ref 8–27)
Bilirubin Total: 0.2 mg/dL (ref 0.0–1.2)
CO2: 22 mmol/L (ref 20–29)
Calcium: 10.9 mg/dL — ABNORMAL HIGH (ref 8.7–10.3)
Chloride: 103 mmol/L (ref 96–106)
Creatinine, Ser: 0.65 mg/dL (ref 0.57–1.00)
GFR calc Af Amer: 110 mL/min/{1.73_m2} (ref >59)
GFR calc non Af Amer: 95 mL/min/{1.73_m2} (ref >59)
Globulin, Total: 2.7 g/dL (ref 1.5–4.5)
Glucose: 116 mg/dL — ABNORMAL HIGH (ref 65–99)
Potassium: 4.7 mmol/L (ref 3.5–5.2)
Sodium: 139 mmol/L (ref 134–144)
Total Protein: 7.3 g/dL (ref 6.0–8.5)

## 2020-02-09 LAB — PARATHYROID HORMONE, INTACT (NO CA): PTH: 42 pg/mL (ref 15–65)

## 2020-02-09 LAB — TSH: TSH: 2.16 u[IU]/mL (ref 0.450–4.500)

## 2020-02-10 ENCOUNTER — Other Ambulatory Visit: Payer: Self-pay | Admitting: *Deleted

## 2020-02-10 LAB — SPECIMEN STATUS REPORT

## 2020-02-11 LAB — PROTEIN ELECTROPHORESIS
A/G Ratio: 1.2 (ref 0.7–1.7)
Albumin ELP: 4 g/dL (ref 2.9–4.4)
Alpha 1: 0.2 g/dL (ref 0.0–0.4)
Alpha 2: 0.9 g/dL (ref 0.4–1.0)
Beta: 1.2 g/dL (ref 0.7–1.3)
Gamma Globulin: 1 g/dL (ref 0.4–1.8)
Globulin, Total: 3.4 g/dL (ref 2.2–3.9)
Total Protein: 7.4 g/dL (ref 6.0–8.5)

## 2020-02-11 LAB — SPECIMEN STATUS REPORT

## 2020-02-11 LAB — VITAMIN D 25 HYDROXY (VIT D DEFICIENCY, FRACTURES): Vit D, 25-Hydroxy: 16.7 ng/mL — ABNORMAL LOW (ref 30.0–100.0)

## 2020-02-15 DIAGNOSIS — M65331 Trigger finger, right middle finger: Secondary | ICD-10-CM | POA: Diagnosis not present

## 2020-02-24 ENCOUNTER — Other Ambulatory Visit: Payer: Self-pay | Admitting: Family Medicine

## 2020-02-24 LAB — PTH-RELATED PEPTIDE: PTH-related peptide: <2 pmol/L

## 2020-02-24 LAB — VITAMIN D 1,25 DIHYDROXY
Vitamin D 1, 25 (OH)2 Total: 60 pg/mL
Vitamin D2 1, 25 (OH)2: <10 pg/mL
Vitamin D3 1, 25 (OH)2: 59 pg/mL

## 2020-02-29 ENCOUNTER — Telehealth: Payer: Self-pay

## 2020-02-29 NOTE — Telephone Encounter (Signed)
We are referring patient to a thyroid doctor and she doesn't want to see Dr. Dorris Fetch or anyone in [place].  Patient prefers to go to someone in [place] because she is more familiar with that area.

## 2020-03-07 ENCOUNTER — Other Ambulatory Visit: Payer: Self-pay | Admitting: *Deleted

## 2020-03-08 NOTE — Telephone Encounter (Signed)
Call pt to see what is going on for why she is requesting refill on lasix.  Who was the previous prescriber?  Thx. Dr . Karie Schwalbe

## 2020-03-09 ENCOUNTER — Encounter: Payer: Self-pay | Admitting: *Deleted

## 2020-03-09 NOTE — Telephone Encounter (Signed)
It looks like Dr. Brett Canales was refilling this. He took it over from another provider but didn't give a reason why. Sent mychart message to pt.

## 2020-03-09 NOTE — Telephone Encounter (Signed)
Pls see note below. Dr. Ladona Ridgel

## 2020-03-11 ENCOUNTER — Telehealth: Payer: Self-pay

## 2020-03-11 MED ORDER — FUROSEMIDE 20 MG PO TABS: 10.0000 mg | ORAL_TABLET | Freq: Every day | ORAL | 1 refills | Status: DC

## 2020-03-11 NOTE — Telephone Encounter (Signed)
Last med check up 02/08/20. Needs refill on lasix 20mg  takes one half tablet daily. Pt states dr Loanne Drilling originally prescribed it but dr Richardson Landry was doing her refills. [facility]

## 2020-03-11 NOTE — Telephone Encounter (Signed)
Pt.notified

## 2020-03-11 NOTE — Telephone Encounter (Signed)
Pt called nurse question who wrote the meds for furosemide (LASIX) 20 MG tablet [id number] - [place], [place] - [address] AT [facility] [address] Loanne Drilling is one that put her on this pill Dr Richardson Landry was one that kept the refill going? Pt is completley out of meds   Pt call back [phone]

## 2020-03-11 NOTE — Telephone Encounter (Signed)
sent 

## 2020-04-27 ENCOUNTER — Other Ambulatory Visit: Payer: Self-pay

## 2020-04-27 ENCOUNTER — Encounter: Payer: Self-pay | Admitting: Nurse Practitioner

## 2020-04-27 ENCOUNTER — Ambulatory Visit: Payer: BC Managed Care – PPO | Admitting: Nurse Practitioner

## 2020-04-27 VITALS — BP 160/80 | HR 95 | Temp 98.2°F | Resp 18 | Wt 208.0 lb

## 2020-04-27 DIAGNOSIS — I10 Essential (primary) hypertension: Secondary | ICD-10-CM

## 2020-04-27 DIAGNOSIS — F419 Anxiety disorder, unspecified: Secondary | ICD-10-CM

## 2020-04-27 DIAGNOSIS — W57XXXA Bitten or stung by nonvenomous insect and other nonvenomous arthropods, initial encounter: Secondary | ICD-10-CM

## 2020-04-27 DIAGNOSIS — S80862A Insect bite (nonvenomous), left lower leg, initial encounter: Secondary | ICD-10-CM

## 2020-04-28 ENCOUNTER — Telehealth: Payer: BC Managed Care – PPO | Admitting: Nurse Practitioner

## 2020-04-28 ENCOUNTER — Other Ambulatory Visit: Payer: Self-pay

## 2020-04-28 DIAGNOSIS — F419 Anxiety disorder, unspecified: Secondary | ICD-10-CM

## 2020-04-28 NOTE — Progress Notes (Signed)
   Subjective:    Patient ID: Kaitlin [name], female    DOB: Nov 20, 1955, 65 y.o.   MRN: [id number]  HPI  65 year old female, follow up from yesterday's appointment.   Patient has been monitoring her BP at home and it has remained elevated. She was able to get an appointment with her PCP tomorrow.   She was told her [facility] doctor is no longer in practice when she called for follow up.   This was a telehealth appointment with patient/patient consented to phone visit.      Objective:   Physical Exam  There was no physical exam performed as this was a telehealth appointment, patient was in no acute distress during phone conversation with provider.        Assessment & Plan:  Will initiate referral to a new behavioral health doctor for patient.  She will seek immediate medical attention for any acutely worsening symptoms or if BP is greater or equal to 170/100 as discussed.

## 2020-04-28 NOTE — Addendum Note (Signed)
Addended by: Madilyn Hook on: 04/28/2020 03:35 PM   Modules accepted: Orders

## 2020-04-28 NOTE — Progress Notes (Signed)
Subjective:    Patient ID: Kaitlin [name], female    DOB: 23-Jun-1955, 65 y.o.   MRN: [id number]  HPI  65 year old female presenting to [place] at [facility] with complaints of bug bite to left lower extremity. This occurred one week ago, she then noted some swelling just next to the bite about three days after the bite. Denies warmth to extremity, denies pain, denies itching.   She also states that she has been under an increased amount of stress lately and has been more anxious. She uses Klonopin as needed, and takes this regularly- even while at work to control her "nerves". She has some chest discomfort when anxious that resolves when she takes her Klonopin. She has tried Zoloft in the past and states that she experienced hallucinations when taking that medication. She has seen Behavioral health in the past, has not been in over a year and was recently told her provider is no longer with the practice.   She sees Dr. Salli Quarry for BP management, has been to cardiology in the past.    Patient states that she also gets very nervous when she comes to a doctor's office but also feels her BP is elevated today.   Past Medical History:  Diagnosis Date  . Anxiety   . Colon polyp    April 2008  . Depression   . Essential hypertension   . Folliculitis 07/13/2583  . Helicobacter pylori gastritis    AUG 2008 PREVPAC: nausea-->HELIDAC: GI UPSET  . Hypothyroidism   . Irritable bowel syndrome   . Kidney stones   . Mixed hyperlipidemia   . Mixed stress and urge urinary incontinence 09/26/2012  . Obesity   . Panic attacks   . Reflux   . Tubulovillous adenoma 08/07/10   Colonoscopy w/ Dr Oneida Alar w/ 7 polyps removed-bx showed tubular adenomas.  Largest 1cm-hepatic flexure polyp->TA.      Current Outpatient Medications  Medication Instructions  . aspirin EC 81 mg, Oral, Daily  . blood glucose meter kit and supplies Dispense based on patient and insurance preference. Test once a day  E11.9  . clonazePAM (KLONOPIN) 1 mg, Oral, Daily at bedtime  . CONTOUR TEST test strip TEST ONCE DAILY AS DIRECTED  . ELDERBERRY PO 100 mg, Oral, Daily  . enalapril (VASOTEC) 20 MG tablet TAKE 1 TABLET BY MOUTH EVERYDAY AT BEDTIME  . furosemide (LASIX) 10 mg, Oral, Daily  . levothyroxine (SYNTHROID) 50 mcg, Oral, Daily  . lovastatin (MEVACOR) 40 mg, Oral, Daily at bedtime  . Magnesium 250 mg, Oral, Daily  . Oxcarbazepine (TRILEPTAL) 300 mg, Oral, 2 times daily  . pantoprazole (PROTONIX) 40 mg, Oral, 2 times daily before meals  . verapamil (CALAN-SR) 240 mg, Oral, Every morning, In the morning   . vitamin B-12 (CYANOCOBALAMIN) 1,000 mcg, Oral, Daily    Review of Systems  Constitutional: Negative.   HENT: Negative.   Respiratory: Positive for chest tightness.   Cardiovascular: Negative.   Genitourinary: Negative.   Musculoskeletal: Negative.   Skin: Positive for wound.  Neurological: Negative.   Psychiatric/Behavioral: The patient is nervous/anxious.    Today's Vitals   04/27/20 1501  BP: (!) 160/80  Pulse: 95  Resp: 18  Temp: 98.2 F (36.8 C)  TempSrc: Oral  SpO2: 97%  Weight: 208 lb (94.3 kg)   Body mass index is 34.61 kg/m.   Recheck of BP at end of visit 155/82    Objective:   Physical Exam Constitutional:  Appearance: Normal appearance.  HENT:     Head: Normocephalic.  Cardiovascular:     Rate and Rhythm: Normal rate and regular rhythm.     Heart sounds: Normal heart sounds.  Pulmonary:     Effort: Pulmonary effort is normal.  Skin:    General: Skin is warm and dry.     Findings: Wound present.          Comments: Small bite wound to left shin skin intact without signs of infection. Skin temperature uniform throughout extremity, slight swelling just proximal to wound that appears unrelated more likely hematoma unrelated to insect bite. Distal circulation and sensation intact. Pedal pulses 2+.   Neurological:     Mental Status: She is alert and  oriented to person, place, and time.  Psychiatric:        Attention and Perception: Attention normal.        Mood and Affect: Mood is anxious.        Speech: Speech normal.        Behavior: Behavior normal.        Thought Content: Thought content normal.        Cognition and Memory: Cognition and memory normal.        Judgment: Judgment normal.           Assessment & Plan:   Advised patient to schedule with primary care to address blood pressure.  Also discussed keeping record of BP at home and patient has blood pressure monitor at home and will start recording pressures.   Will also initiate a new referral to psychiatry to re evaluate the management of her anxiety. Discussed the EAP counseling available through [facility], patient did not seem interested in that at this time.   Regarding chief complaint of insect bite- appears to be healing appropriately no further intervention recommended for that at this time.   Discussed stress management and importance of self care, sleep etc.   Patient will see PCP 04/29/2020 We will contact with Psychiatry appointment information.

## 2020-04-29 ENCOUNTER — Other Ambulatory Visit: Payer: Self-pay

## 2020-04-29 ENCOUNTER — Encounter: Payer: Self-pay | Admitting: Nurse Practitioner

## 2020-04-29 ENCOUNTER — Ambulatory Visit: Payer: BC Managed Care – PPO | Admitting: Nurse Practitioner

## 2020-04-29 VITALS — BP 145/80 | HR 89 | Temp 97.5°F | Ht 65.0 in | Wt 199.0 lb

## 2020-04-29 DIAGNOSIS — K219 Gastro-esophageal reflux disease without esophagitis: Secondary | ICD-10-CM

## 2020-04-29 DIAGNOSIS — F419 Anxiety disorder, unspecified: Secondary | ICD-10-CM

## 2020-04-29 DIAGNOSIS — F32A Depression, unspecified: Secondary | ICD-10-CM | POA: Diagnosis not present

## 2020-04-29 DIAGNOSIS — I10 Essential (primary) hypertension: Secondary | ICD-10-CM

## 2020-04-29 MED ORDER — ESCITALOPRAM OXALATE 10 MG PO TABS
10.0000 mg | ORAL_TABLET | Freq: Every day | ORAL | 2 refills | Status: DC
Start: 2020-04-29 — End: 2020-05-27

## 2020-04-29 NOTE — Progress Notes (Signed)
Subjective:    Patient ID: Kaitlin [name], female    DOB: [birth year], 65 y.o.   MRN: [id number]  HPI  HPI Patient presents with c/o elevated BP. She was referred from the occupational health office at work where she had gone to be seen about her BP after receiving elevated readings at home on her home BP machine. Denies headaches, chest pain, dizziness, numbness, tingling, weakness, loss of consciousness, shortness of breath. Currently on 10 mg furesomide daily, 20 mg enalapril maleate daily, and verapamil 240 mg daily.  Has not done well with her diet lately. She ate some crackers for work this morning.  Normally eats potato chips and a spam sandwich after she gets home from work at midnight.  She is active during work, she is a Sports coach for [employer], but not doing any exercise at this time. She has lost 8 lbs since her last visit.  She is under a lot of stress.  Her husband received a DWI last summer and lost his license.  History of depression. She saw a psychiatrist in the past who prescribed her lexapro and xanax and this combination worked well for her.  She stopped taking the lexapro years ago because she felt better.  At one point she tried some Zoloft prescribed to her by Dr. Wolfgang Phoenix but she did not tolerate it well. Reports Dr. Lovena Le has taken her off the xanax and prescribed clonopin and this does not help her sleep.  She is having trouble sleeping.  She feels very anxious when she lays down to sleep at night.  Denies caffeine use, smoking.  States she feels anxious, nervous, and depressed, all the time. Denies suicidal or homicidal thoughts or ideation.   She also c/o a 'spot' on her L shin.  She has shown it to the occupational health provider at her work who told her it was a bruise.  It does not bother her.  She recalls hitting it on the dustpan at work.      Review of Systems  Constitutional: Negative for weight loss.  Eyes: Negative for blurred vision and double vision.   Respiratory: Negative for shortness of breath.   Cardiovascular: Negative for chest pain, palpitations and leg swelling.  Gastrointestinal: Negative for constipation, diarrhea, heartburn and nausea.  Genitourinary: Negative for dysuria.  Musculoskeletal:       Patient reports stress-related tightness in shoulders, neck  muscles  Neurological: Negative for dizziness, tingling, tremors, weakness and headaches.  Psychiatric/Behavioral: Positive for depression. Negative for substance abuse. The patient is nervous/anxious.        Positive for sleep disturbance.     [place] Visit from 04/29/2020 in [facility]  PHQ-9 Total Score 15     GAD 7 : Generalized Anxiety Score 04/29/2020  Nervous, Anxious, on Edge 3  Control/stop worrying 3  Worry too much - different things 3  Trouble relaxing 3  Restless 3  Easily annoyed or irritable 3  Afraid - awful might happen 2  Total GAD 7 Score 20  Anxiety Difficulty Somewhat difficult        Objective:   Physical Exam Vitals and nursing note reviewed. Exam conducted with a chaperone present.  Constitutional:      General: She is not in acute distress. Cardiovascular:     Rate and Rhythm: Normal rate and regular rhythm.     Heart sounds: Normal heart sounds. No murmur heard.   Pulmonary:     Effort: No respiratory  distress.     Breath sounds: Normal breath sounds.  Abdominal:     Palpations: Abdomen is soft. There is no mass.     Tenderness: There is no abdominal tenderness. There is no guarding.  Musculoskeletal:        General: No swelling.     Right lower leg: No edema.     Left lower leg: No edema.  Skin:    General: Skin is warm and dry.     Comments: Small soft, fluctuant non tender mass left pretibial area approx 3.0cm x 3.0cm. No warmth noted.  Neurological:     Mental Status: She is alert and oriented to person, place, and time.     Motor: Gait steady. Gets on and off exam table without  difficulty.  Psychiatric:        Mood and Affect: Mood normal.        Thought Content: Thought content normal.        Judgment: Judgment normal.     Comments: Patient tearful at times throughout visit discussing her anxiety but cheerful and positive prior to leaving office. Dressed appropriately. Making good eye contact.      Vitals:   04/29/20 1121  BP: (!) 156/84  Pulse: 89  Temp: (!) 97.5 F (36.4 C)  SpO2: 98%   She reports readings at home over the last few days on her home BP machine of:  173/79 160/79 144/72  BP recheck at end of visit was 145/80.      Assessment & Plan:   Problem List Items Addressed This Visit      Cardiovascular and Mediastinum   Essential hypertension, benign - Primary     Digestive   GERD (gastroesophageal reflux disease)     Other   Anxiety and depression   Relevant Medications   escitalopram (LEXAPRO) 10 MG tablet   Other Relevant Orders   Ambulatory referral to Psychiatry     Meds ordered this encounter  Medications  . escitalopram (LEXAPRO) 10 MG tablet    Sig: Take 1 tablet (10 mg total) by mouth daily.    Dispense:  30 tablet    Refill:  2    Order Specific Question:   Supervising Provider    Answer:   Sallee Lange A [[id number]]       Discussed improving diet and exercise with patient, such as cooking at home more frequently, avoiding fast food, prepackaged and processed snacks, and salty processed meats.  Educated patient on weight loss/nutrition management programs that are low cost and effective such as MyFitnessPal and Weight Watchers.  She also feels positive about trying to walk 10-15 minutes a day.  She is currently on 3 medications to manage her blood pressure.  She was tearful at multiple moments throughout the visit.  She is agreeable to try lexapro and come back in one month for a blood pressure recheck and see how her anxiety is doing.  Will also refer her to Dr. Harrington Challenger for more appropriate management of her anxiety  and depression.  Discussed with patient to see what stress management resources are available through work, such as therapy or massage.  She will try to walk outside daily as well.   Patient to seek help immediately if any suicidal thoughts or ideation. She agrees with this plan.   Return in about 1 month (around 05/27/2020) for BP/Anxiety. 30 minutes was spent with the patient including previsit chart review, time spent with patient, discussion of health issues, review  of data including medical record, and documentation of the visit.

## 2020-04-29 NOTE — Progress Notes (Signed)
   Subjective:    Patient ID: KYM SCANNELL, female    DOB: September 01, 1955, 65 y.o.   MRN: [id number]  Hypertension This is a chronic problem. Compliance problems: started back on diet yesterday.    Pt states she has a knot on left lower leg from where she hit something one week ago.   Would like a referral for depression and anxiety.    Review of Systems     Objective:   Physical Exam        Assessment & Plan:

## 2020-04-30 ENCOUNTER — Encounter: Payer: Self-pay | Admitting: Nurse Practitioner

## 2020-05-09 ENCOUNTER — Other Ambulatory Visit: Payer: Self-pay | Admitting: Family Medicine

## 2020-05-27 ENCOUNTER — Encounter: Payer: Self-pay | Admitting: Nurse Practitioner

## 2020-05-27 ENCOUNTER — Ambulatory Visit: Payer: BC Managed Care – PPO | Admitting: Nurse Practitioner

## 2020-05-27 ENCOUNTER — Telehealth: Payer: Self-pay

## 2020-05-27 ENCOUNTER — Other Ambulatory Visit: Payer: Self-pay

## 2020-05-27 ENCOUNTER — Telehealth: Payer: Self-pay | Admitting: Nurse Practitioner

## 2020-05-27 VITALS — BP 131/70 | Temp 97.1°F | Wt 202.6 lb

## 2020-05-27 DIAGNOSIS — I10 Essential (primary) hypertension: Secondary | ICD-10-CM | POA: Diagnosis not present

## 2020-05-27 DIAGNOSIS — F32A Depression, unspecified: Secondary | ICD-10-CM | POA: Diagnosis not present

## 2020-05-27 DIAGNOSIS — F419 Anxiety disorder, unspecified: Secondary | ICD-10-CM | POA: Diagnosis not present

## 2020-05-27 DIAGNOSIS — F331 Major depressive disorder, recurrent, moderate: Secondary | ICD-10-CM

## 2020-05-27 MED ORDER — ESCITALOPRAM OXALATE 10 MG PO TABS: 10.0000 mg | ORAL_TABLET | Freq: Every day | ORAL | 1 refills | Status: DC

## 2020-05-27 NOTE — Telephone Encounter (Signed)
Patient was seen this morning and took her blood pressure at 1 pm and it was 111/57. Please advise

## 2020-05-27 NOTE — Progress Notes (Addendum)
Subjective:    Patient ID: Kaitlin [name], female    DOB: 06/08/1955, 65 y.o.   MRN: [id number]  HPI  Pt here to recheck up on blood pressure. Pt states her blood pressure has been doing good since starting Lexapro. States BP has improved. Weekly BP since starting lexapro are as follows: 183/96,140, 86,131/80,117/61, 130/68 and 131/70. Last seen by cardiologist on 09/08/19.  Admits to having nausea as the only side effect. States mood has been better and has been feeling good. Nausea is mild and she wants to continue medication.    Denies smoking, alcohol and illicit drug use.   Review of Systems  Constitutional: Negative for activity change, chills, fatigue and fever.          Respiratory: Negative for cough, chest tightness, shortness of breath and wheezing.   Cardiovascular: Negative for chest pain, palpitations and leg swelling.  Gastrointestinal: Positive for nausea. Negative for abdominal distention, abdominal pain, constipation, diarrhea and vomiting.       Nausea with medication   Genitourinary: Negative.   Psychiatric/Behavioral: Negative for suicidal ideas. The patient is nervous/anxious.        States she's anxious today    See previous note.      Objective:   Physical Exam Constitutional:      General: She is not in acute distress.    Appearance: Normal appearance. She is not ill-appearing.  Cardiovascular:     Rate and Rhythm: Normal rate and regular rhythm.     Heart sounds: Murmur heard.      Comments: Grade 2/6 mild soft early systolic murmur noted loudest at 2nd ICS/LSB. Does not radiate into carotids. No bruits or thrills.  Pulmonary:     Effort: Pulmonary effort is normal. No respiratory distress.     Breath sounds: Normal breath sounds. No wheezing.  Abdominal:     General: There is no distension.     Palpations: Abdomen is soft. There is no mass.     Tenderness: There is no abdominal tenderness.  Neurological:     Mental Status: She is alert and  oriented to person, place, and time.  Psychiatric:        Mood and Affect: Mood normal.        Behavior: Behavior normal.        Thought Content: Thought content normal.        Judgment: Judgment normal.    Today's Vitals   05/27/20 0911  BP: (!) 152/82  Temp: (!) 97.1 F (36.2 C)  Weight: 202 lb 9.6 oz (91.9 kg)   Body mass index is 33.71 kg/m.        Assessment & Plan:   Problem List Items Addressed This Visit      Cardiovascular and Mediastinum   Essential hypertension, benign     Other   MDD (major depressive disorder), recurrent episode, moderate (HCC) - Primary   Relevant Medications   escitalopram (LEXAPRO) 10 MG tablet   Anxiety and depression   Relevant Medications   escitalopram (LEXAPRO) 10 MG tablet     Meds ordered this encounter  Medications  . escitalopram (LEXAPRO) 10 MG tablet    Sig: Take 1 tablet (10 mg total) by mouth daily.    Dispense:  90 tablet    Refill:  1    Order Specific Question:   Supervising Provider    Answer:   Sallee Lange A [[id number]]   Plan/Education  Continue Lexapro as directed.   Advised patient  to continue monitoring blood pressure daily with a goal of less than 140/90.   Reviewed lifestyle factors affecting GERD.   Plan to discuss annual labs at next visit.    Return in about 3 months (around 08/27/2020).

## 2020-05-27 NOTE — Telephone Encounter (Signed)
Pt got home rechecked blood pressure and eat some dinner it was 139/60 and heart rate was 70   Pt call [phone]

## 2020-05-27 NOTE — Telephone Encounter (Signed)
Noted  

## 2020-05-27 NOTE — Telephone Encounter (Signed)
Patient notified and verbalized understanding. 

## 2020-05-27 NOTE — Telephone Encounter (Signed)
Much better than in the office. Continue to monitor BP. Record and bring to next visit please. Thanks.

## 2020-05-27 NOTE — Progress Notes (Signed)
   Subjective:    Patient ID: Kaitlin [name], female    DOB: 1955-05-22, 65 y.o.   MRN: [id number]  HPI Pt here for recheck on blood pressure. Pt states her blood pressure has been doing good since being on depression med.    Review of Systems     Objective:   Physical Exam        Assessment & Plan:

## 2020-05-28 ENCOUNTER — Encounter: Payer: Self-pay | Admitting: Nurse Practitioner

## 2020-06-04 ENCOUNTER — Other Ambulatory Visit: Payer: Self-pay | Admitting: Family Medicine

## 2020-06-07 ENCOUNTER — Encounter: Payer: Self-pay | Admitting: Obstetrics & Gynecology

## 2020-06-07 ENCOUNTER — Other Ambulatory Visit (HOSPITAL_COMMUNITY)
Admission: RE | Admit: 2020-06-07 | Discharge: 2020-06-07 | Disposition: A | Discharge status: Home / Self Care | Payer: BC Managed Care – PPO | Source: Ambulatory Visit | Attending: Obstetrics & Gynecology | Admitting: Obstetrics & Gynecology

## 2020-06-07 ENCOUNTER — Other Ambulatory Visit: Payer: Self-pay

## 2020-06-07 ENCOUNTER — Other Ambulatory Visit (HOSPITAL_COMMUNITY): Payer: Self-pay | Admitting: Obstetrics & Gynecology

## 2020-06-07 ENCOUNTER — Ambulatory Visit (INDEPENDENT_AMBULATORY_CARE_PROVIDER_SITE_OTHER): Payer: BC Managed Care – PPO | Admitting: Obstetrics & Gynecology

## 2020-06-07 VITALS — BP 142/80 | HR 76 | Ht 65.0 in | Wt 194.0 lb

## 2020-06-07 DIAGNOSIS — Z1231 Encounter for screening mammogram for malignant neoplasm of breast: Secondary | ICD-10-CM

## 2020-06-07 DIAGNOSIS — Z1211 Encounter for screening for malignant neoplasm of colon: Secondary | ICD-10-CM | POA: Diagnosis not present

## 2020-06-07 DIAGNOSIS — Z1212 Encounter for screening for malignant neoplasm of rectum: Secondary | ICD-10-CM

## 2020-06-07 DIAGNOSIS — Z01419 Encounter for gynecological examination (general) (routine) without abnormal findings: Secondary | ICD-10-CM

## 2020-06-07 NOTE — Progress Notes (Signed)
Subjective:     Kaitlin [name] is a 65 y.o. female here for a routine exam.  No LMP recorded. Patient is postmenopausal. G1P1 Birth Control Method:  menopausal Menstrual Calendar(currently): none  Current complaints: none.   Current acute medical issues:  none   Recent Gynecologic History No LMP recorded. Patient is postmenopausal. Last Pap: 2020,  normal Last mammogram: 2020  normal  Past Medical History:  Diagnosis Date  . Anxiety   . Colon polyp    April 2008  . Depression   . Essential hypertension   . Folliculitis 1/61/0960  . Helicobacter pylori gastritis    AUG 2008 PREVPAC: nausea-->HELIDAC: GI UPSET  . Hypothyroidism   . Irritable bowel syndrome   . Kidney stones   . Mixed hyperlipidemia   . Mixed stress and urge urinary incontinence 09/26/2012  . Obesity   . Panic attacks   . Reflux   . Tubulovillous adenoma 08/07/10   Colonoscopy w/ Dr Oneida Alar w/ 7 polyps removed-bx showed tubular adenomas.  Largest 1cm-hepatic flexure polyp->TA.      Past Surgical History:  Procedure Laterality Date  . CHOLECYSTECTOMY    . COLONOSCOPY  APR 2008   AC/[place] SIMPLE ADENOMA  . COLONOSCOPY  08/07/2010   Dr. Raynald Kemp adenomas and tubulovillous adenoma; needs surveillance 2015  . COLONOSCOPY N/A 09/18/2013   Dr.Rourk- normal rectum, 2 diminutive polys in the mid sigmoid segment o/w the remainder of the colonic mucosa appeared normal.hyperplastic poylps  . COLONOSCOPY WITH PROPOFOL N/A 02/23/2019   Procedure: COLONOSCOPY WITH PROPOFOL;  Surgeon: Daneil Dolin, MD;  Location: [facility];  Service: Endoscopy;  Laterality: N/A;  2:45pm - office unable to reach pt to move her up  . ESOPHAGOGASTRODUODENOSCOPY N/A 11/29/2014   RMR: Gastric erosions. Small hiatal hernia. Status post biopsy.   Marland Kitchen MASS EXCISION  02/28/2011   Procedure: EXCISION MASS;  Surgeon: Donato Heinz, MD;  Location: [facility];  Service: General;  Laterality: Right;  soft tissue mass  . POLYPECTOMY  02/23/2019    Procedure: POLYPECTOMY;  Surgeon: Daneil Dolin, MD;  Location: [facility];  Service: Endoscopy;;  . UPPER GASTROINTESTINAL ENDOSCOPY  AUG 2008 PAIN/NAUSEA   H. PYLORI treated    OB History    Gravida  1   Para  1   Term      Preterm      AB      Living  1     SAB      IAB      Ectopic      Multiple      Live Births  1           Social History   Socioeconomic History  . Marital status: Married    Spouse name: Not on file  . Number of children: 1  . Years of education: Not on file  . Highest education level: Not on file  Occupational History  . Not on file  Tobacco Use  . Smoking status: Never Smoker  . Smokeless tobacco: Never Used  . Tobacco comment: Never Smoked  Vaping Use  . Vaping Use: Never used  Substance and Sexual Activity  . Alcohol use: Not Currently    Comment: 2 glasses of wine a week  . Drug use: No  . Sexual activity: Not Currently    Birth control/protection: Post-menopausal  Other Topics Concern  . Not on file  Social History Narrative  . Not on file   Social Determinants of  Health   Financial Resource Strain: Low Risk   . Difficulty of Paying Living Expenses: Not hard at all  Food Insecurity: No Food Insecurity  . Worried About Charity fundraiser in the Last Year: Never true  . Ran Out of Food in the Last Year: Never true  Transportation Needs: No Transportation Needs  . Lack of Transportation (Medical): No  . Lack of Transportation (Non-Medical): No  Physical Activity: Inactive  . Days of Exercise per Week: 0 days  . Minutes of Exercise per Session: 0 min  Stress: Stress Concern Present  . Feeling of Stress : Rather much  Social Connections: Unknown  . Frequency of Communication with Friends and Family: Three times a week  . Frequency of Social Gatherings with Friends and Family: Never  . Attends Religious Services: Patient refused  . Active Member of Clubs or Organizations: Yes  . Attends Archivist  Meetings: Never  . Marital Status: Married    Family History  Problem Relation Age of Onset  . Coronary artery disease Mother   . Hypertension Mother   . Heart attack Mother   . Heart disease Mother   . Suicidality Mother   . Coronary artery disease Brother        Age 10  . Heart attack Brother   . Heart disease Brother   . Diabetes Sister   . Diabetes Sister   . Colon cancer Neg Hx   . Anesthesia problems Neg Hx   . Hypotension Neg Hx   . Malignant hyperthermia Neg Hx   . Pseudochol deficiency Neg Hx   . Colon polyps Neg Hx      Current Outpatient Medications:  .  aspirin EC 81 MG tablet, Take 81 mg by mouth daily., Disp: , Rfl:  .  blood glucose meter kit and supplies, Dispense based on patient and insurance preference. Test once a day E11.9, Disp: 1 each, Rfl: 0 .  clonazePAM (KLONOPIN) 1 MG tablet, Take 1 tablet (1 mg total) by mouth at bedtime., Disp: 30 tablet, Rfl: 2 .  CONTOUR TEST test strip, TEST ONCE DAILY AS DIRECTED, Disp: 50 strip, Rfl: 5 .  ELDERBERRY PO, Take 100 mg by mouth daily at 12 noon., Disp: , Rfl:  .  enalapril (VASOTEC) 20 MG tablet, TAKE 1 TABLET BY MOUTH EVERYDAY AT BEDTIME, Disp: 90 tablet, Rfl: 1 .  escitalopram (LEXAPRO) 10 MG tablet, Take 1 tablet (10 mg total) by mouth daily., Disp: 90 tablet, Rfl: 1 .  furosemide (LASIX) 20 MG tablet, TAKE 1/2 TABLET BY MOUTH EVERY DAY, Disp: 15 tablet, Rfl: 0 .  levothyroxine (SYNTHROID) 50 MCG tablet, Take 1 tablet (50 mcg total) by mouth daily., Disp: 90 tablet, Rfl: 1 .  lovastatin (MEVACOR) 40 MG tablet, Take 1 tablet (40 mg total) by mouth at bedtime., Disp: 90 tablet, Rfl: 1 .  Magnesium 250 MG TABS, Take 250 mg by mouth daily. , Disp: , Rfl:  .  Oxcarbazepine (TRILEPTAL) 300 MG tablet, Take 300 mg by mouth 2 (two) times daily. , Disp: , Rfl:  .  pantoprazole (PROTONIX) 40 MG tablet, Take 1 tablet (40 mg total) by mouth 2 (two) times daily before a meal., Disp: 180 tablet, Rfl: 3 .  verapamil (CALAN-SR)  240 MG CR tablet, Take 1 tablet (240 mg total) by mouth every morning. In the morning, Disp: 90 tablet, Rfl: 1 .  vitamin B-12 (CYANOCOBALAMIN) 500 MCG tablet, Take 1,000 mcg by mouth daily., Disp: , Rfl:  Review of Systems  Review of Systems  Constitutional: Negative for fever, chills, weight loss, malaise/fatigue and diaphoresis.  HENT: Negative for hearing loss, ear pain, nosebleeds, congestion, sore throat, neck pain, tinnitus and ear discharge.   Eyes: Negative for blurred vision, double vision, photophobia, pain, discharge and redness.  Respiratory: Negative for cough, hemoptysis, sputum production, shortness of breath, wheezing and stridor.   Cardiovascular: Negative for chest pain, palpitations, orthopnea, claudication, leg swelling and PND.  Gastrointestinal: negative for abdominal pain. Negative for heartburn, nausea, vomiting, diarrhea, constipation, blood in stool and melena.  Genitourinary: Negative for dysuria, urgency, frequency, hematuria and flank pain.  Musculoskeletal: Negative for myalgias, back pain, joint pain and falls.  Skin: Negative for itching and rash.  Neurological: Negative for dizziness, tingling, tremors, sensory change, speech change, focal weakness, seizures, loss of consciousness, weakness and headaches.  Endo/Heme/Allergies: Negative for environmental allergies and polydipsia. Does not bruise/bleed easily.  Psychiatric/Behavioral: Negative for depression, suicidal ideas, hallucinations, memory loss and substance abuse. The patient is not nervous/anxious and does not have insomnia.        Objective:  Blood pressure (!) 142/80, pulse 76, height 5' 5" (1.651 m), weight 194 lb (88 kg).   Physical Exam  Vitals reviewed. Constitutional: She is oriented to person, place, and time. She appears well-developed and well-nourished.  HENT:  Head: Normocephalic and atraumatic.        Right Ear: External ear normal.  Left Ear: External ear normal.  Nose: Nose  normal.  Mouth/Throat: Oropharynx is clear and moist.  Eyes: Conjunctivae and EOM are normal. Pupils are equal, round, and reactive to light. Right eye exhibits no discharge. Left eye exhibits no discharge. No scleral icterus.  Neck: Normal range of motion. Neck supple. No tracheal deviation present. No thyromegaly present.  Cardiovascular: Normal rate, regular rhythm, normal heart sounds and intact distal pulses.  Exam reveals no gallop and no friction rub.   No murmur heard. Respiratory: Effort normal and breath sounds normal. No respiratory distress. She has no wheezes. She has no rales. She exhibits no tenderness.  GI: Soft. Bowel sounds are normal. She exhibits no distension and no mass. There is no tenderness. There is no rebound and no guarding.  Genitourinary:  Breasts no masses skin changes or nipple changes bilaterally      Vulva is normal without lesions Vagina is pink moist without discharge Cervix normal in appearance and pap is done Uterus is normal size shape and contour Adnexa is negative with normal sized ovaries  {Rectal    hemoccult negative, normal tone, no masses  Musculoskeletal: Normal range of motion. She exhibits no edema and no tenderness.  Neurological: She is alert and oriented to person, place, and time. She has normal reflexes. She displays normal reflexes. No cranial nerve deficit. She exhibits normal muscle tone. Coordination normal.  Skin: Skin is warm and dry. No rash noted. No erythema. No pallor.  Psychiatric: She has a normal mood and affect. Her behavior is normal. Judgment and thought content normal.       Medications Ordered at today's visit: No orders of the defined types were placed in this encounter.   Other orders placed at today's visit: No orders of the defined types were placed in this encounter.     Assessment:    Normal Gyn exam.    Plan:    Contraception: none. Mammogram ordered. Follow up in: 3 years.     Return in about 3  years (around 06/08/2023) for yearly.

## 2020-06-08 LAB — CYTOLOGY - PAP
Comment: NEGATIVE
Diagnosis: NEGATIVE
High risk HPV: NEGATIVE

## 2020-06-15 DIAGNOSIS — G4733 Obstructive sleep apnea (adult) (pediatric): Secondary | ICD-10-CM | POA: Diagnosis not present

## 2020-06-20 ENCOUNTER — Telehealth: Payer: Self-pay

## 2020-06-20 MED ORDER — PERMETHRIN 5 % EX CREA: TOPICAL_CREAM | CUTANEOUS | 0 refills | Status: DC

## 2020-06-20 NOTE — Telephone Encounter (Signed)
Dermatology wrote a RX for a cream permethrin and she is wanting to see if she can get another RX of this she is unable to get hold to [facility] dermatologic office[facility] [place]   Pt Call back [phone]

## 2020-06-20 NOTE — Telephone Encounter (Signed)
Contacted pt to verify if it was 5% cream or lotion. Pt verifies cream. Refill sent to pharmacy and pt is aware

## 2020-06-20 NOTE — Telephone Encounter (Signed)
May have 1 refill of this Typical prescription is for 2 ounces with applied from scalp/hairline to the soles of the feet in the evening and shower in the morning  If ongoing troubles with her skin she should follow-up with dermatology

## 2020-06-20 NOTE — Addendum Note (Signed)
Addended by: Vicente Males on: 06/20/2020 03:59 PM   Modules accepted: Orders

## 2020-06-20 NOTE — Telephone Encounter (Signed)
Please advise. Thank you

## 2020-06-27 DIAGNOSIS — G501 Atypical facial pain: Secondary | ICD-10-CM | POA: Diagnosis not present

## 2020-06-27 DIAGNOSIS — G4733 Obstructive sleep apnea (adult) (pediatric): Secondary | ICD-10-CM | POA: Diagnosis not present

## 2020-06-27 DIAGNOSIS — M5481 Occipital neuralgia: Secondary | ICD-10-CM | POA: Diagnosis not present

## 2020-06-27 DIAGNOSIS — G4719 Other hypersomnia: Secondary | ICD-10-CM | POA: Diagnosis not present

## 2020-06-28 DIAGNOSIS — L218 Other seborrheic dermatitis: Secondary | ICD-10-CM | POA: Diagnosis not present

## 2020-06-28 DIAGNOSIS — L304 Erythema intertrigo: Secondary | ICD-10-CM | POA: Diagnosis not present

## 2020-06-28 DIAGNOSIS — L298 Other pruritus: Secondary | ICD-10-CM | POA: Diagnosis not present

## 2020-06-28 DIAGNOSIS — S80862A Insect bite (nonvenomous), left lower leg, initial encounter: Secondary | ICD-10-CM | POA: Diagnosis not present

## 2020-07-08 ENCOUNTER — Other Ambulatory Visit: Payer: Self-pay | Admitting: Family Medicine

## 2020-07-08 ENCOUNTER — Telehealth: Payer: Self-pay | Admitting: Family Medicine

## 2020-07-08 NOTE — Telephone Encounter (Signed)
Last seen by dr taylor for check up 02/08/20. Saw carolyn in march for htn.

## 2020-07-08 NOTE — Telephone Encounter (Signed)
Patient is requesting refill on clonazepam 1 mg called [facility]

## 2020-07-11 NOTE — Telephone Encounter (Signed)
Pt needs appt every 85months for controlled substances, klonapin.  Pt to have appt before more refills.   Thx.   Dr. Lovena Le

## 2020-07-11 NOTE — Telephone Encounter (Signed)
Please schedule appt

## 2020-07-12 ENCOUNTER — Other Ambulatory Visit: Payer: Self-pay | Admitting: Family Medicine

## 2020-07-12 NOTE — Telephone Encounter (Signed)
Needs appt, when can she come in?  Dr. Lovena Le

## 2020-07-13 NOTE — Telephone Encounter (Signed)
Appt made 05/16

## 2020-07-14 DIAGNOSIS — E559 Vitamin D deficiency, unspecified: Secondary | ICD-10-CM | POA: Diagnosis not present

## 2020-07-14 DIAGNOSIS — E213 Hyperparathyroidism, unspecified: Secondary | ICD-10-CM | POA: Diagnosis not present

## 2020-07-14 DIAGNOSIS — Z780 Asymptomatic menopausal state: Secondary | ICD-10-CM | POA: Diagnosis not present

## 2020-07-14 DIAGNOSIS — E039 Hypothyroidism, unspecified: Secondary | ICD-10-CM | POA: Diagnosis not present

## 2020-07-15 DIAGNOSIS — G4733 Obstructive sleep apnea (adult) (pediatric): Secondary | ICD-10-CM | POA: Diagnosis not present

## 2020-07-15 DIAGNOSIS — E213 Hyperparathyroidism, unspecified: Secondary | ICD-10-CM | POA: Diagnosis not present

## 2020-07-18 ENCOUNTER — Ambulatory Visit: Payer: BC Managed Care – PPO | Admitting: Family Medicine

## 2020-07-18 ENCOUNTER — Other Ambulatory Visit: Payer: Self-pay

## 2020-07-18 ENCOUNTER — Encounter: Payer: Self-pay | Admitting: Family Medicine

## 2020-07-18 VITALS — BP 128/72 | HR 71 | Ht 65.0 in | Wt 195.0 lb

## 2020-07-18 DIAGNOSIS — F419 Anxiety disorder, unspecified: Secondary | ICD-10-CM

## 2020-07-18 DIAGNOSIS — R7303 Prediabetes: Secondary | ICD-10-CM | POA: Diagnosis not present

## 2020-07-18 DIAGNOSIS — E039 Hypothyroidism, unspecified: Secondary | ICD-10-CM

## 2020-07-18 DIAGNOSIS — I10 Essential (primary) hypertension: Secondary | ICD-10-CM | POA: Diagnosis not present

## 2020-07-18 MED ORDER — CLONAZEPAM 1 MG PO TABS: 1.0000 mg | ORAL_TABLET | Freq: Every day | ORAL | 2 refills | Status: DC

## 2020-07-18 NOTE — Progress Notes (Signed)
Patient ID: Kaitlin [name], female    DOB: Mar 04, 1956, 65 y.o.   MRN: [id number]   Chief Complaint  Patient presents with  . Anxiety   Subjective:    HPI   CC- f/u anxiety. Pt states she has been out of anxiety med and her anxiety is up today.  Pt having some elevated bp in past but improved.  Having some anixety and stress and feeling not as controlled.  Pt was restarted on lexapro 68m.   Taking klonapin 122mprn and when has it dong well. Prn.  Has cpap and went on it few months ago.  Hard to get used to it.  Some nights 4-6hrs per night. Does feel better in am when waking up.  Chronic headache and occipital neuralgia- Seeing neuro and giving her trileptal.  Seeing Dr. ShManuella [name] Occipital neuralgia on left and facial pian and intermittent numbers and on trileptal.   Pt was supposed to see Dr. RoHarrington Challengernd referred to psychiatry. Pt did not go yet.  Medical History [place] a past medical history of Anxiety, Colon polyp, Depression, Essential hypertension, Folliculitis (09/04/45/4259 Helicobacter pylori gastritis, Hypothyroidism, Irritable bowel syndrome, Kidney stones, Mixed hyperlipidemia, Mixed stress and urge urinary incontinence (09/26/2012), Obesity, Panic attacks, Reflux, and Tubulovillous adenoma (08/07/10).   Outpatient Encounter Medications as of 07/18/2020  Medication Sig  . aspirin EC 81 MG tablet Take 81 mg by mouth daily.  . blood glucose meter kit and supplies Dispense based on patient and insurance preference. Test once a day E11.9  . CONTOUR TEST test strip TEST ONCE DAILY AS DIRECTED  . ELDERBERRY PO Take 100 mg by mouth daily at 12 noon.  . enalapril (VASOTEC) 20 MG tablet TAKE 1 TABLET BY MOUTH EVERYDAY AT BEDTIME  . escitalopram (LEXAPRO) 10 MG tablet Take 1 tablet (10 mg total) by mouth daily.  . furosemide (LASIX) 20 MG tablet TAKE 1/2 TABLET BY MOUTH EVERY DAY  . levothyroxine (SYNTHROID) 50 MCG tablet TAKE 1 TABLET BY MOUTH EVERY DAY  . lovastatin  (MEVACOR) 40 MG tablet Take 1 tablet (40 mg total) by mouth at bedtime.  . Magnesium 250 MG TABS Take 250 mg by mouth daily.   . Oxcarbazepine (TRILEPTAL) 300 MG tablet Take 300 mg by mouth 2 (two) times daily.   . pantoprazole (PROTONIX) 40 MG tablet Take 1 tablet (40 mg total) by mouth 2 (two) times daily before a meal.  . permethrin (ELIMITE) 5 % cream Apply from scalp/hairline to the soles of feet in evening and shower in morning.  . verapamil (CALAN-SR) 240 MG CR tablet Take 1 tablet (240 mg total) by mouth every morning. In the morning  . [DISCONTINUED] clonazePAM (KLONOPIN) 1 MG tablet Take 1 tablet (1 mg total) by mouth at bedtime.  . [DISCONTINUED] vitamin B-12 (CYANOCOBALAMIN) 500 MCG tablet Take 1,000 mcg by mouth daily.  . clonazePAM (KLONOPIN) 1 MG tablet Take 1 tablet (1 mg total) by mouth at bedtime.   No facility-administered encounter medications on file as of 07/18/2020.     Review of Systems  Constitutional: Negative for chills and fever.  HENT: Negative for congestion, rhinorrhea and sore throat.   Respiratory: Negative for cough, shortness of breath and wheezing.   Cardiovascular: Negative for chest pain and leg swelling.  Gastrointestinal: Negative for abdominal pain, diarrhea, nausea and vomiting.  Genitourinary: Negative for dysuria and frequency.  Musculoskeletal: Negative for arthralgias and back pain.  Skin: Negative for rash.  Neurological: Negative for dizziness, weakness  and headaches.  Psychiatric/Behavioral: Negative for dysphoric mood, self-injury, sleep disturbance and suicidal ideas. The patient is nervous/anxious.      Vitals BP 128/72   Pulse 71   Ht _0  (1.651 m)   Wt 195 lb (88.5 kg)   SpO2 98%   BMI 32.45 kg/m   Objective:   Physical Exam Vitals and nursing note reviewed.  Constitutional:      General: She is not in acute distress.    Appearance: Normal appearance.  HENT:     Head: Normocephalic and atraumatic.  Cardiovascular:      Rate and Rhythm: Normal rate and regular rhythm.     Pulses: Normal pulses.     Heart sounds: Normal heart sounds.  Pulmonary:     Effort: Pulmonary effort is normal.     Breath sounds: Normal breath sounds. No wheezing, rhonchi or rales.  Musculoskeletal:        General: Normal range of motion.     Right lower leg: No edema.     Left lower leg: No edema.  Skin:    General: Skin is warm and dry.     Findings: No lesion or rash.  Neurological:     General: No focal deficit present.     Mental Status: She is alert and oriented to person, place, and time.     Cranial Nerves: No cranial nerve deficit.  Psychiatric:        Mood and Affect: Mood normal.        Behavior: Behavior normal.        Thought Content: Thought content normal.        Judgment: Judgment normal.      Assessment and Plan   1. Anxiety - clonazePAM (KLONOPIN) 1 MG tablet; Take 1 tablet (1 mg total) by mouth at bedtime.  Dispense: 30 tablet; Refill: 2  2. Hypothyroidism, unspecified type  3. Prediabetes - CMP14+EGFR - Hemoglobin A1c - Microalbumin, urine  4. Essential hypertension, benign - CBC - CMP14+EGFR - Lipid panel   htn- stable.  prediab- will recheck labs.  Hypothyroid- stable. Cont meds.  Anxiety- stable. Cont meds. Cont lexapro and klonapin.  If pt needing further medications, would refer back to psychiatry.  Pt has not established after multiple attempts to refer to psychiatry.  Gave [facility] counseling.   Return in about 3 months (around 10/18/2020) for f/u anxiety.  07/18/2020

## 2020-07-18 NOTE — Patient Instructions (Signed)
[phone]  [facility].  Number for therapist.

## 2020-07-25 ENCOUNTER — Telehealth: Payer: Self-pay | Admitting: Family Medicine

## 2020-07-25 ENCOUNTER — Other Ambulatory Visit: Payer: Self-pay | Admitting: Family Medicine

## 2020-07-25 NOTE — Telephone Encounter (Signed)
Seen 07/18/20 and given one month supply. Labs were ordered. Not sure if you want results before refilling med.

## 2020-07-25 NOTE — Telephone Encounter (Signed)
Lab Results  Component Value Date   HGBA1C 4.9 12/20/2016    Lab Results  Component Value Date   CREATININE 0.65 02/08/2020     Lab Results  Component Value Date   CHOL 183 02/08/2020   HDL 55 02/08/2020   LDLCALC 110 (H) 02/08/2020   TRIG 97 02/08/2020   CHOLHDL 3.3 02/08/2020     BP Readings from Last 3 Encounters:  07/18/20 128/72  06/07/20 (!) 142/80  05/27/20 131/70

## 2020-07-25 NOTE — Telephone Encounter (Signed)
Refill request sent to dr taylor earlier today. Await response

## 2020-07-25 NOTE — Telephone Encounter (Signed)
Patient is requesting a refill furosemide 20 mg called [facility]

## 2020-07-25 NOTE — Telephone Encounter (Signed)
Needs labs before refills on lasix.   Dr. Darene Lamer   Message text   Tried to call no answer

## 2020-07-26 NOTE — Telephone Encounter (Signed)
Tried to call no answer

## 2020-07-28 ENCOUNTER — Encounter: Payer: Self-pay | Admitting: Nurse Practitioner

## 2020-07-28 ENCOUNTER — Ambulatory Visit: Payer: BC Managed Care – PPO | Admitting: Nurse Practitioner

## 2020-07-28 ENCOUNTER — Other Ambulatory Visit: Payer: Self-pay

## 2020-07-28 VITALS — BP 142/78 | HR 73 | Temp 98.2°F | Resp 16 | Wt 201.0 lb

## 2020-07-28 DIAGNOSIS — S0990XA Unspecified injury of head, initial encounter: Secondary | ICD-10-CM

## 2020-07-28 NOTE — Progress Notes (Signed)
   Subjective:    Patient ID: Kaitlin [name], female    DOB: 1956/03/01, 65 y.o.   MRN: [id number]  HPI  65 year old female presenting to [facility] today after getting hit in the head with a heavy piece of glass yesterday while cleaning at her job.   She works as a Sports coach for [employer].   Since the time of injury she has experienced a mild headache and has not taken anything OTC as of yet. She worked until midnight last night. Normal shift is 3pm-midnight.   Denies a history of concussion  Workman's Comp paperwork completed as well    Review of Systems  Constitutional: Negative.   HENT: Negative.   Eyes: Negative.   Musculoskeletal: Negative.   Neurological: Positive for headaches. Negative for weakness.       Objective:   Physical Exam HENT:     Head: Normocephalic.  Eyes:     Extraocular Movements: Extraocular movements intact.     Conjunctiva/sclera: Conjunctivae normal.     Pupils: Pupils are equal, round, and reactive to light.  Musculoskeletal:        General: Normal range of motion.     Cervical back: Normal range of motion.  Neurological:     General: No focal deficit present.     Mental Status: She is alert and oriented to person, place, and time.     Sensory: No sensory deficit.     Motor: No weakness.     Coordination: Romberg sign negative.     Comments: Heel to toe WNL, single leg stand WNL  Psychiatric:        Mood and Affect: Mood normal.    ACE score 2       Assessment & Plan:  Paperwork completed and faxed to HR along with return to work note  OK to return to work today, advised tylenol OTC for headache. RTC with any new or worsening symptoms as discussed.

## 2020-07-29 MED ORDER — FUROSEMIDE 20 MG PO TABS: 10.0000 mg | ORAL_TABLET | Freq: Every day | ORAL | 0 refills | Status: DC

## 2020-07-29 NOTE — Telephone Encounter (Signed)
Patient notified and stated she will have her labs drawn Tuesday 08/02/20

## 2020-07-29 NOTE — Addendum Note (Signed)
Addended by: Erven Colla on: 07/29/2020 12:02 PM   Modules accepted: Orders

## 2020-07-29 NOTE — Telephone Encounter (Signed)
Tried to call no answer

## 2020-08-01 ENCOUNTER — Encounter: Payer: Self-pay | Admitting: Family Medicine

## 2020-08-02 DIAGNOSIS — R7303 Prediabetes: Secondary | ICD-10-CM | POA: Diagnosis not present

## 2020-08-02 DIAGNOSIS — I10 Essential (primary) hypertension: Secondary | ICD-10-CM | POA: Diagnosis not present

## 2020-08-02 NOTE — Telephone Encounter (Signed)
See pt call

## 2020-08-03 LAB — CMP14+EGFR
ALT: 30 IU/L (ref 0–32)
AST: 27 IU/L (ref 0–40)
Albumin/Globulin Ratio: 1.7 (ref 1.2–2.2)
Albumin: 4.5 g/dL (ref 3.8–4.8)
Alkaline Phosphatase: 82 IU/L (ref 44–121)
BUN/Creatinine Ratio: 13 (ref 12–28)
BUN: 9 mg/dL (ref 8–27)
Bilirubin Total: 0.3 mg/dL (ref 0.0–1.2)
CO2: 20 mmol/L (ref 20–29)
Calcium: 10.4 mg/dL — ABNORMAL HIGH (ref 8.7–10.3)
Chloride: 106 mmol/L (ref 96–106)
Creatinine, Ser: 0.7 mg/dL (ref 0.57–1.00)
Globulin, Total: 2.7 g/dL (ref 1.5–4.5)
Glucose: 115 mg/dL — ABNORMAL HIGH (ref 65–99)
Potassium: 4.6 mmol/L (ref 3.5–5.2)
Sodium: 139 mmol/L (ref 134–144)
Total Protein: 7.2 g/dL (ref 6.0–8.5)
eGFR: 97 mL/min/{1.73_m2} (ref >59)

## 2020-08-03 LAB — LIPID PANEL
Chol/HDL Ratio: 3 ratio (ref 0.0–4.4)
Cholesterol, Total: 175 mg/dL (ref 100–199)
HDL: 58 mg/dL (ref >39)
LDL Chol Calc (NIH): 101 mg/dL — ABNORMAL HIGH (ref 0–99)
Triglycerides: 84 mg/dL (ref 0–149)
VLDL Cholesterol Cal: 16 mg/dL (ref 5–40)

## 2020-08-03 LAB — CBC
Hematocrit: 40.8 % (ref 34.0–46.6)
Hemoglobin: 13.4 g/dL (ref 11.1–15.9)
MCH: 28.2 pg (ref 26.6–33.0)
MCHC: 32.8 g/dL (ref 31.5–35.7)
MCV: 86 fL (ref 79–97)
Platelets: 272 10*3/uL (ref 150–450)
RBC: 4.76 x10E6/uL (ref 3.77–5.28)
RDW: 12.8 % (ref 11.7–15.4)
WBC: 7.1 10*3/uL (ref 3.4–10.8)

## 2020-08-03 LAB — HEMOGLOBIN A1C
Est. average glucose Bld gHb Est-mCnc: 140 mg/dL
Hgb A1c MFr Bld: 6.5 % — ABNORMAL HIGH (ref 4.8–5.6)

## 2020-08-03 LAB — MICROALBUMIN, URINE: Microalbumin, Urine: 5.4 ug/mL

## 2020-08-04 ENCOUNTER — Telehealth: Payer: Self-pay | Admitting: Family Medicine

## 2020-08-04 ENCOUNTER — Encounter: Payer: Self-pay | Admitting: Family Medicine

## 2020-08-04 NOTE — Telephone Encounter (Signed)
Blood count normal.  Glucose slight elevation at 115, normal is under 100.  Calcium slight elevation at 10.4, has improved from last visit.  We will continue to monitor.  Hemoglobin A1c-6.5.  Showing patient is in the diabetic range now.  Will offer 2 options, patient can start metformin once daily for diabetes at this point.  Or she can start eating a low-carb diet avoid breads, pasta, rices, noodles, snacks, sodas, sweets.  An increase in exercise.  And then we can recheck it in 3 months.  Have patient let us know what she would like to do.   Cholesterol-stable continue to eat a low-cholesterol diet.   Thanks, Dr. Lovena Le.

## 2020-08-04 NOTE — Telephone Encounter (Signed)
Patient requesting lab results, please advise.

## 2020-08-04 NOTE — Telephone Encounter (Signed)
Called patient and made aware of her lab results. See result note.

## 2020-08-04 NOTE — Telephone Encounter (Signed)
Patient is wanting to discuss blood work results that was told to her. She didn't understand

## 2020-08-13 ENCOUNTER — Other Ambulatory Visit: Payer: Self-pay | Admitting: Family Medicine

## 2020-08-15 DIAGNOSIS — G4733 Obstructive sleep apnea (adult) (pediatric): Secondary | ICD-10-CM | POA: Diagnosis not present

## 2020-08-16 MED ORDER — ENALAPRIL MALEATE 20 MG PO TABS: 20.0000 mg | ORAL_TABLET | Freq: Every day | ORAL | 1 refills | Status: AC

## 2020-08-24 ENCOUNTER — Other Ambulatory Visit: Payer: Self-pay | Admitting: Family Medicine

## 2020-08-28 ENCOUNTER — Other Ambulatory Visit: Payer: Self-pay | Admitting: Family Medicine

## 2020-08-29 ENCOUNTER — Other Ambulatory Visit: Payer: Self-pay

## 2020-08-29 ENCOUNTER — Ambulatory Visit (HOSPITAL_COMMUNITY)
Admission: RE | Admit: 2020-08-29 | Discharge: 2020-08-29 | Disposition: A | Discharge status: Home / Self Care | Payer: BC Managed Care – PPO | Source: Ambulatory Visit | Attending: Obstetrics & Gynecology | Admitting: Obstetrics & Gynecology

## 2020-08-29 ENCOUNTER — Telehealth: Payer: Self-pay | Admitting: Family Medicine

## 2020-08-29 DIAGNOSIS — Z1231 Encounter for screening mammogram for malignant neoplasm of breast: Secondary | ICD-10-CM | POA: Diagnosis not present

## 2020-08-29 IMAGING — MG MM DIGITAL SCREENING BILAT W/ TOMO AND CAD
6 of 12 series · 6 of 36 positions shown · non-contrast
Comparison: Previous exam(s).

CLINICAL DATA: Screening.

EXAM:
DIGITAL SCREENING BILATERAL MAMMOGRAM WITH TOMOSYNTHESIS AND CAD
TECHNIQUE: Bilateral screening digital craniocaudal and mediolateral oblique
mammograms were obtained. Bilateral screening digital breast
tomosynthesis was performed. The images were evaluated with
computer-aided detection.

[L MLO synth-2D]
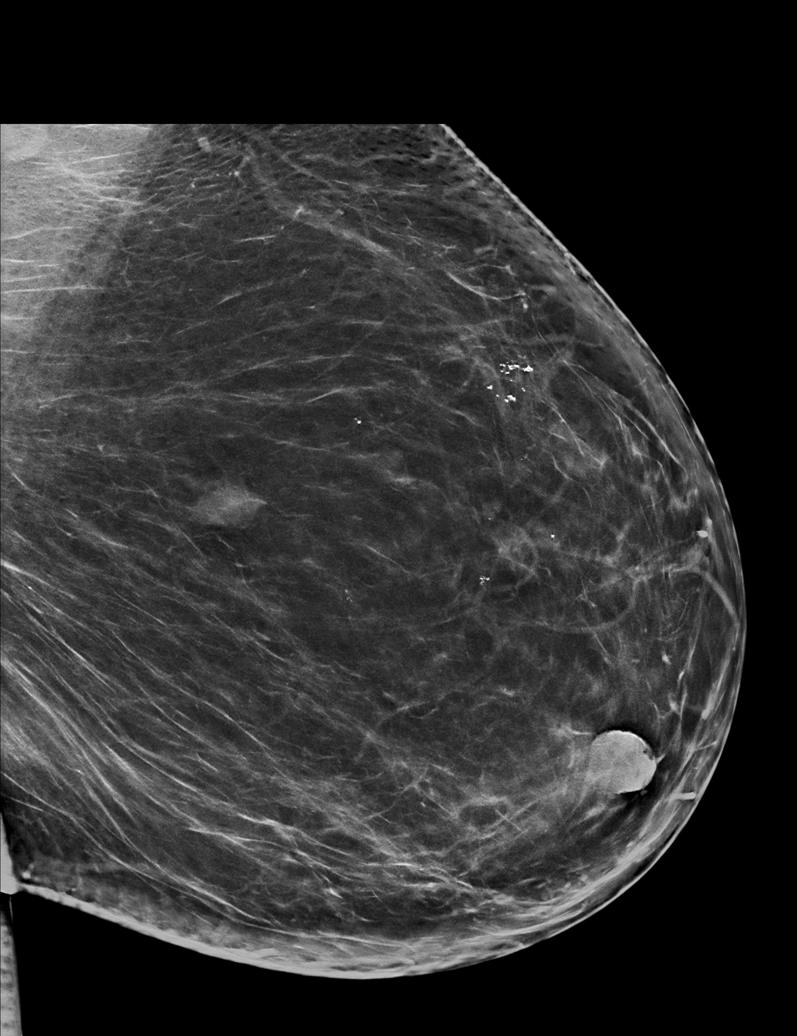

[R CC synth-2D (1 of 2)]
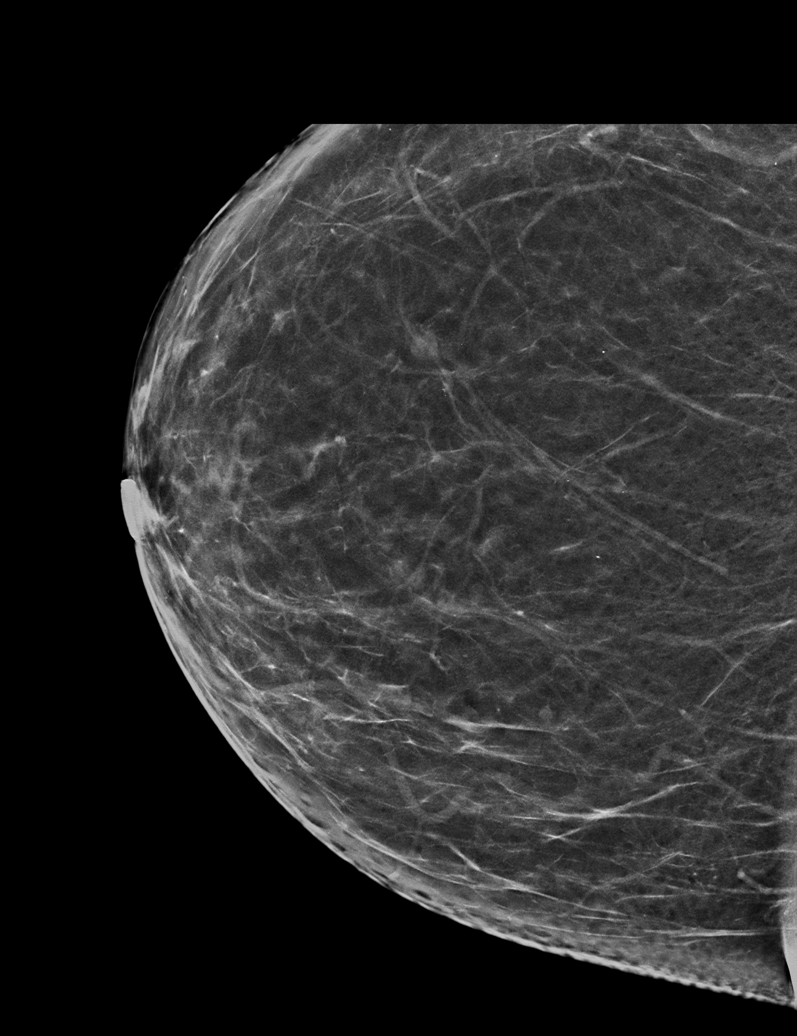

[R CC synth-2D (2 of 2)]
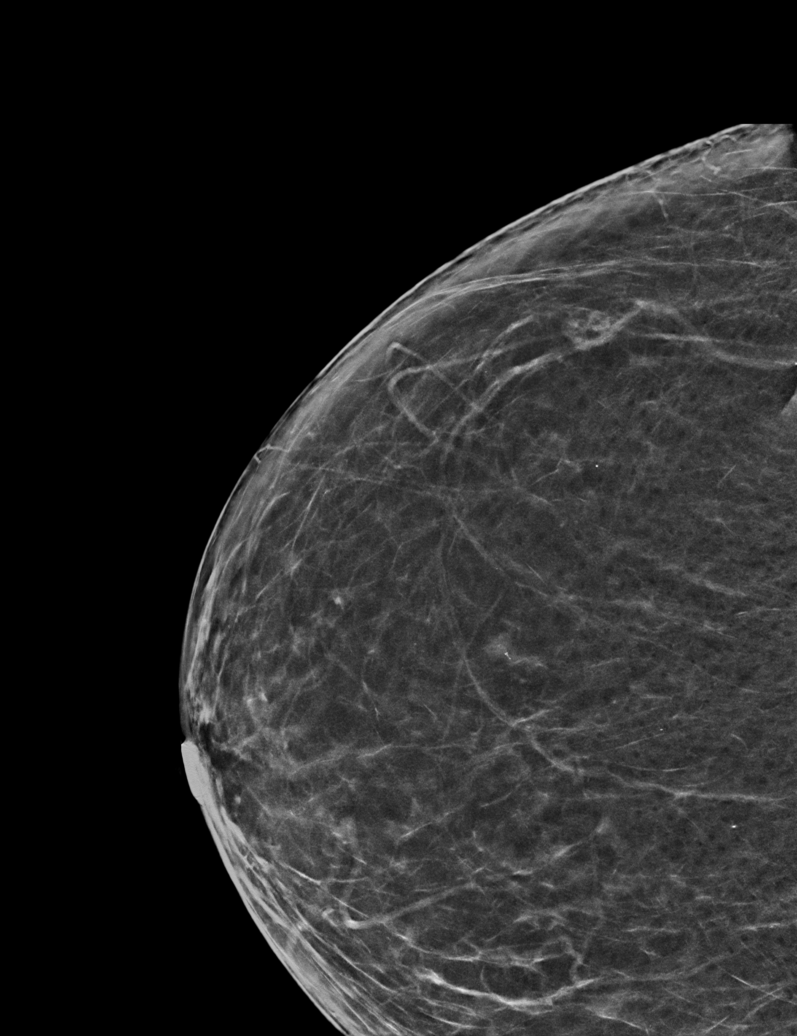

[L CC synth-2D (1 of 2)]
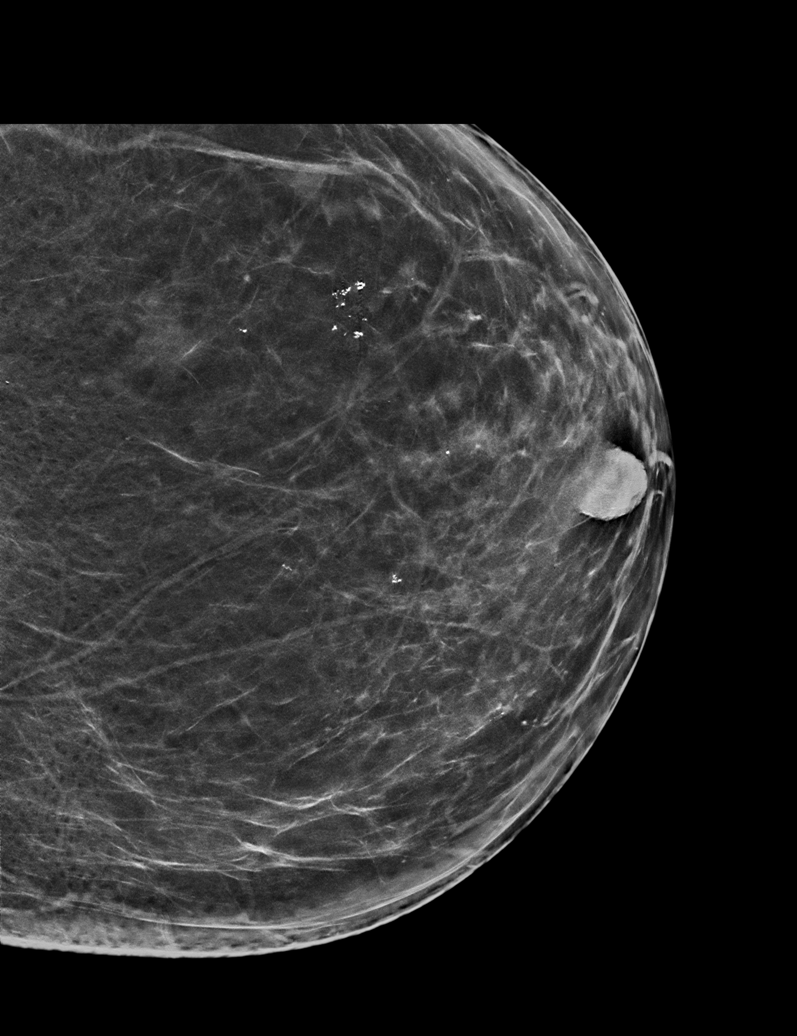

[L CC synth-2D (2 of 2)]
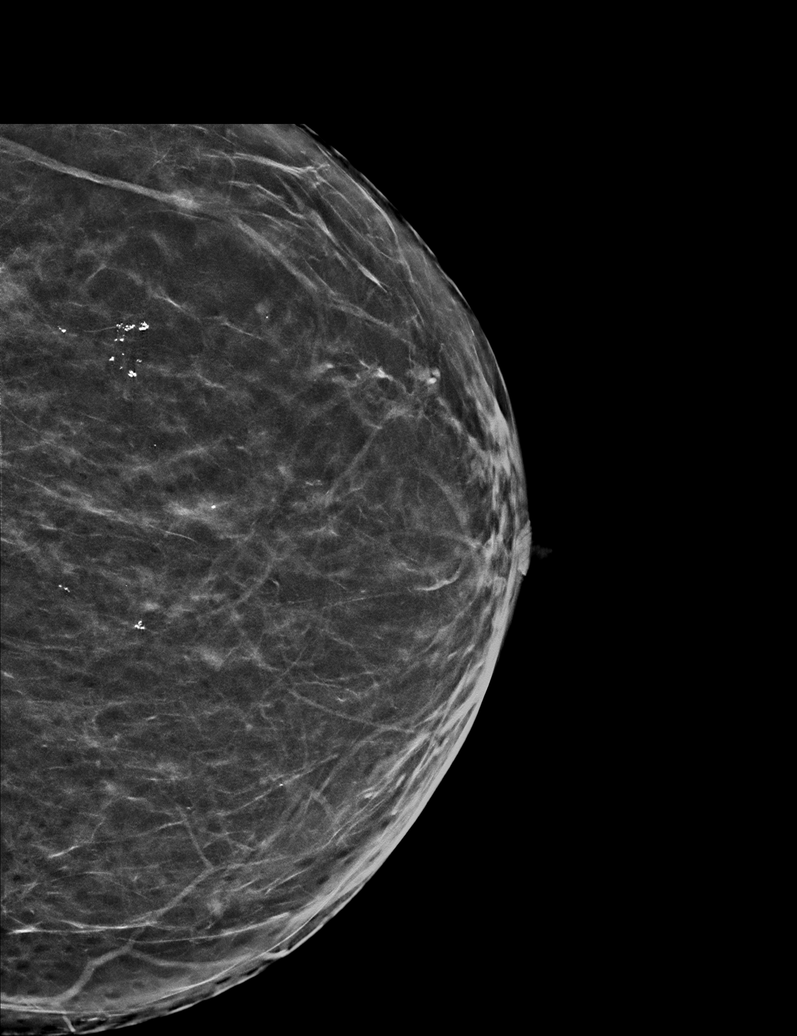

[R MLO synth-2D]
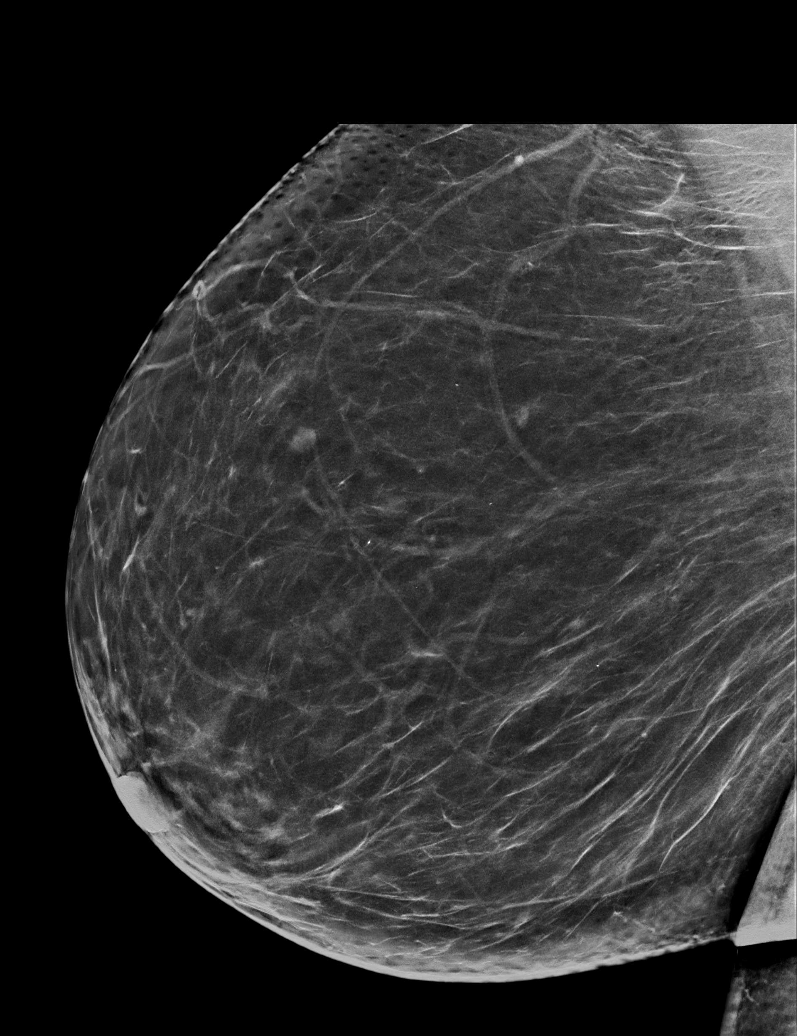

[6 of 36 positions shown; findings below may reference images not displayed]

ACR Breast Density Category b: There are scattered areas of
fibroglandular density.
FINDINGS: There are no findings suspicious for malignancy.
IMPRESSION: No mammographic evidence of malignancy. A result letter of this
screening mammogram will be mailed directly to the patient.

RECOMMENDATION:
Screening mammogram in one year. (Code:[BY])

BI-RADS CATEGORY  1: Negative.

## 2020-08-29 MED ORDER — VERAPAMIL HCL ER 240 MG PO TBCR: 240.0000 mg | EXTENDED_RELEASE_TABLET | Freq: Every morning | ORAL | 0 refills | Status: DC

## 2020-08-29 NOTE — Telephone Encounter (Signed)
Refill sent to pharmacy; left message to return call; pt needs follow up appt

## 2020-08-29 NOTE — Telephone Encounter (Signed)
Patient called to request refill of verapamil (CALAN-SR) 240 MG CR tablet [[id number]]   Pharmacy confirmed as  [id number] [place], [address], [place] [address]  Phone:  [phone]  Fax:  [phone]  DEA #:  [id number]  Please advise patient at [phone]

## 2020-08-30 NOTE — Telephone Encounter (Signed)
Patient called back; appt scheduled for next month.

## 2020-08-30 NOTE — Telephone Encounter (Signed)
Pt called office to schedule a follow up

## 2020-09-01 ENCOUNTER — Telehealth: Payer: Self-pay | Admitting: Family Medicine

## 2020-09-01 NOTE — Telephone Encounter (Signed)
Please advise. Thank you

## 2020-09-01 NOTE — Telephone Encounter (Signed)
Pharmacy contacted. Pt has 4 refills on Lexapro and it is ready for pickup. Klonopin has 2 refills and pharmacy will get that ready also. Attempted to contact patient; no answer

## 2020-09-01 NOTE — Telephone Encounter (Signed)
Pt is wondering if Dr.Taylor can refill her medications until her app. On 09/09/2020 patient is completely out clonazePAM (KLONOPIN) 1 MG tablet  escitalopram (LEXAPRO) 10 MG tablet  [id number] [place] [place] [facility] [address] [facility] [address]  Pt would like a call back if not possible

## 2020-09-02 NOTE — Telephone Encounter (Signed)
Pt contacted and is aware that refills are ready.

## 2020-09-09 ENCOUNTER — Ambulatory Visit: Payer: BC Managed Care – PPO | Admitting: Family Medicine

## 2020-09-09 ENCOUNTER — Encounter: Payer: Self-pay | Admitting: Family Medicine

## 2020-09-09 ENCOUNTER — Other Ambulatory Visit: Payer: Self-pay

## 2020-09-09 VITALS — BP 140/78 | HR 83 | Temp 97.0°F | Ht 65.0 in | Wt 201.0 lb

## 2020-09-09 DIAGNOSIS — F419 Anxiety disorder, unspecified: Secondary | ICD-10-CM

## 2020-09-09 DIAGNOSIS — E782 Mixed hyperlipidemia: Secondary | ICD-10-CM

## 2020-09-09 DIAGNOSIS — M542 Cervicalgia: Secondary | ICD-10-CM

## 2020-09-09 DIAGNOSIS — I10 Essential (primary) hypertension: Secondary | ICD-10-CM

## 2020-09-09 DIAGNOSIS — E119 Type 2 diabetes mellitus without complications: Secondary | ICD-10-CM | POA: Diagnosis not present

## 2020-09-09 DIAGNOSIS — R079 Chest pain, unspecified: Secondary | ICD-10-CM

## 2020-09-09 DIAGNOSIS — F331 Major depressive disorder, recurrent, moderate: Secondary | ICD-10-CM

## 2020-09-09 DIAGNOSIS — E039 Hypothyroidism, unspecified: Secondary | ICD-10-CM

## 2020-09-09 MED ORDER — ESCITALOPRAM OXALATE 10 MG PO TABS: 10.0000 mg | ORAL_TABLET | Freq: Every day | ORAL | 1 refills | Status: DC

## 2020-09-09 MED ORDER — MELOXICAM 15 MG PO TABS: 15.0000 mg | ORAL_TABLET | Freq: Every day | ORAL | 0 refills | Status: DC

## 2020-09-09 MED ORDER — VERAPAMIL HCL ER 240 MG PO TBCR: 240.0000 mg | EXTENDED_RELEASE_TABLET | Freq: Every morning | ORAL | 1 refills | Status: DC

## 2020-09-09 MED ORDER — CLONAZEPAM 1 MG PO TABS: 1.0000 mg | ORAL_TABLET | Freq: Every day | ORAL | 5 refills | Status: DC

## 2020-09-09 MED ORDER — FUROSEMIDE 20 MG PO TABS: 10.0000 mg | ORAL_TABLET | Freq: Every day | ORAL | 0 refills | Status: DC

## 2020-09-09 MED ORDER — LEVOTHYROXINE SODIUM 50 MCG PO TABS: 50.0000 ug | ORAL_TABLET | Freq: Every day | ORAL | 1 refills | Status: AC

## 2020-09-09 MED ORDER — LOVASTATIN 40 MG PO TABS: 40.0000 mg | ORAL_TABLET | Freq: Every day | ORAL | 1 refills | Status: DC

## 2020-09-09 NOTE — Telephone Encounter (Signed)
Tried to call no answer

## 2020-09-09 NOTE — Progress Notes (Signed)
Patient ID: Kaitlin [name], female    DOB: 1956/01/23, 65 y.o.   MRN: [id number]   Chief Complaint  Patient presents with   Hypertension   Neck Pain    Neck soreness    Subjective:    HPI  HTN Pt compliant with BP meds.  No SEs Denies chest pain, sob, LE swelling, or blurry vision.  Anxiety/MDD-pt stating still taking clonazepam at night prn.  Pt is taking 37m lexapro and is helping.  Neck pain- left side, radiating up the sid eof her head,   Chest discomfort in chest 3-4 days ago.  Felt a little pressure.  Took her anxiety meds and it felt a little better.  Chest pain went away, neck pain in back still there.  Not taking meds- neck pain.  Elevated A1c- 6.5 on last visit in 5/22. Showing now in diabetic zone.  Pt stating she has had DM2- diet controlled since 2018.   Hyperparathyroidism- and seeing endo, in [place].  F/u 12 mo with endo.   Anxiety- taking lexapro and clonazepam. On cpap- new and helping, few months.  Medical History BKarlehas a past medical history of Anxiety, Colon polyp, Depression, Essential hypertension, Folliculitis (71/30/8657, Helicobacter pylori gastritis, Hypothyroidism, Irritable bowel syndrome, Kidney stones, Mixed hyperlipidemia, Mixed stress and urge urinary incontinence (09/26/2012), Obesity, Panic attacks, Reflux, and Tubulovillous adenoma (08/07/10).   Outpatient Encounter Medications as of 09/09/2020  Medication Sig   aspirin EC 81 MG tablet Take 81 mg by mouth daily.   blood glucose meter kit and supplies Dispense based on patient and insurance preference. Test once a day E11.9   CONTOUR TEST test strip TEST ONCE DAILY AS DIRECTED   enalapril (VASOTEC) 20 MG tablet Take 1 tablet (20 mg total) by mouth daily.   Magnesium 250 MG TABS Take 250 mg by mouth daily.   meloxicam (MOBIC) 15 MG tablet Take 1 tablet (15 mg total) by mouth daily.   Oxcarbazepine (TRILEPTAL) 300 MG tablet Take 300 mg by mouth 2 (two) times daily.     pantoprazole (PROTONIX) 40 MG tablet Take 1 tablet (40 mg total) by mouth 2 (two) times daily before a meal.   [DISCONTINUED] clonazePAM (KLONOPIN) 1 MG tablet Take 1 tablet (1 mg total) by mouth at bedtime.   [DISCONTINUED] escitalopram (LEXAPRO) 10 MG tablet Take 1 tablet (10 mg total) by mouth daily.   [DISCONTINUED] furosemide (LASIX) 20 MG tablet TAKE 1/2 TABLET BY MOUTH DAILY   [DISCONTINUED] levothyroxine (SYNTHROID) 50 MCG tablet TAKE 1 TABLET BY MOUTH EVERY DAY   [DISCONTINUED] lovastatin (MEVACOR) 40 MG tablet Take 1 tablet (40 mg total) by mouth at bedtime.   [DISCONTINUED] permethrin (ELIMITE) 5 % cream Apply from scalp/hairline to the soles of feet in evening and shower in morning.   [DISCONTINUED] verapamil (CALAN-SR) 240 MG CR tablet Take 1 tablet (240 mg total) by mouth every morning. In the morning   clonazePAM (KLONOPIN) 1 MG tablet Take 1 tablet (1 mg total) by mouth at bedtime.   escitalopram (LEXAPRO) 10 MG tablet Take 1 tablet (10 mg total) by mouth daily.   furosemide (LASIX) 20 MG tablet Take 0.5 tablets (10 mg total) by mouth daily.   levothyroxine (SYNTHROID) 50 MCG tablet Take 1 tablet (50 mcg total) by mouth daily.   lovastatin (MEVACOR) 40 MG tablet Take 1 tablet (40 mg total) by mouth at bedtime.   verapamil (CALAN-SR) 240 MG CR tablet Take 1 tablet (240 mg total) by mouth every morning. In  the morning   [DISCONTINUED] ELDERBERRY PO Take 100 mg by mouth daily at 12 noon.   No facility-administered encounter medications on file as of 09/09/2020.     Review of Systems  Constitutional:  Negative for chills and fever.  HENT:  Negative for congestion, rhinorrhea and sore throat.   Respiratory:  Negative for cough, shortness of breath and wheezing.   Cardiovascular:  Negative for chest pain and leg swelling.  Gastrointestinal:  Negative for abdominal pain, diarrhea, nausea and vomiting.  Genitourinary:  Negative for dysuria and frequency.  Musculoskeletal:  Negative  for arthralgias and back pain.  Skin:  Negative for rash.  Neurological:  Negative for dizziness, weakness and headaches.    Vitals BP 140/78   Pulse 83   Temp (!) 97 F (36.1 C)   Ht _0  (1.651 m)   Wt 201 lb (91.2 kg)   SpO2 97%   BMI 33.45 kg/m   Objective:   Physical Exam Vitals and nursing note reviewed.  Constitutional:      General: She is not in acute distress.    Appearance: Normal appearance. She is not ill-appearing.  HENT:     Head: Normocephalic and atraumatic.     Nose: Nose normal.     Mouth/Throat:     Mouth: Mucous membranes are moist.     Pharynx: Oropharynx is clear.  Eyes:     Extraocular Movements: Extraocular movements intact.     Conjunctiva/sclera: Conjunctivae normal.     Pupils: Pupils are equal, round, and reactive to light.  Neck:     Comments: Dec rom with side bending to rt and rotation to right.  Ttp over left scm and left trapeziums with hypertonicity.  Normal rom of bilateral arms. Normal inspection. No ttp over the cervical or thoracic spinous processes.   Cardiovascular:     Rate and Rhythm: Normal rate and regular rhythm.     Pulses: Normal pulses.     Heart sounds: Normal heart sounds.  Pulmonary:     Effort: Pulmonary effort is normal.     Breath sounds: Normal breath sounds. No wheezing, rhonchi or rales.  Musculoskeletal:        General: Normal range of motion.     Cervical back: Neck supple. Tenderness present.     Right lower leg: No edema.     Left lower leg: No edema.  Skin:    General: Skin is warm and dry.     Findings: No lesion or rash.  Neurological:     General: No focal deficit present.     Mental Status: She is alert and oriented to person, place, and time.     Cranial Nerves: No cranial nerve deficit.     Sensory: No sensory deficit.     Motor: No weakness.  Psychiatric:        Mood and Affect: Mood normal.        Behavior: Behavior normal.     Assessment and Plan   1. Anxiety - clonazePAM  (KLONOPIN) 1 MG tablet; Take 1 tablet (1 mg total) by mouth at bedtime.  Dispense: 30 tablet; Refill: 5 - escitalopram (LEXAPRO) 10 MG tablet; Take 1 tablet (10 mg total) by mouth daily.  Dispense: 90 tablet; Refill: 1  2. Neck pain - meloxicam (MOBIC) 15 MG tablet; Take 1 tablet (15 mg total) by mouth daily.  Dispense: 30 tablet; Refill: 0 - DG Cervical Spine Complete; Future  3. Chest pain, unspecified type - EKG 12-Lead  4. Diabetes mellitus type 2, diet-controlled (HCC) - Basic metabolic panel - Hemoglobin A1c  5. MDD (major depressive disorder), recurrent episode, moderate (HCC) - clonazePAM (KLONOPIN) 1 MG tablet; Take 1 tablet (1 mg total) by mouth at bedtime.  Dispense: 30 tablet; Refill: 5 - escitalopram (LEXAPRO) 10 MG tablet; Take 1 tablet (10 mg total) by mouth daily.  Dispense: 90 tablet; Refill: 1  6. Essential hypertension, benign - verapamil (CALAN-SR) 240 MG CR tablet; Take 1 tablet (240 mg total) by mouth every morning. In the morning  Dispense: 90 tablet; Refill: 1  7. Hypothyroidism, unspecified type - levothyroxine (SYNTHROID) 50 MCG tablet; Take 1 tablet (50 mcg total) by mouth daily.  Dispense: 90 tablet; Refill: 1  8. Mixed hyperlipidemia   DM2- diet controlled.  Cont to dec carbs in the diet. Labs reviewed from 5/22. Stable.   Left cervical pain- Xray ordered for left neck pain. Mobic, heat/ice, topicals otc.   Chest pain- resolved,  Ordered EKG- reviewed.  - no st elevation, no ischemic changes.  -similar to compared to last ekg in 09/08/19. Has LAE, LVH and poor r wave progression, non spec t wave abnormality. -cont to monitor, gave return precautions for chest pain.   Htn- suboptimal. Cont to monitor and dec salt in diet.  Cont meds.  Dm2- diet controlled, work on [employer] in exercising. Checking labs again in early sept.  Return in about 6 months (around 03/12/2021) for f/u htn, hypothyroid, anxiety.

## 2020-09-09 NOTE — Telephone Encounter (Signed)
[place] 7/8

## 2020-09-09 NOTE — Patient Instructions (Addendum)
Xray for neck today or next week as walk in.   Nonspecific Chest Pain Chest pain can be caused by many different conditions. Some causes of chest pain can be life-threatening. These will require treatment right away. Serious causes of chest pain include: Heart attack. A tear in the body's main blood vessel. Redness and swelling (inflammation) around your heart. Blood clot in your lungs. Other causes of chest pain may not be so serious. These include: Heartburn. Anxiety or stress. Damage to bones or muscles in your chest. Lung infections. Chest pain can feel like: Pain or discomfort in your chest. Crushing, pressure, aching, or squeezing pain. Burning or tingling. Dull or sharp pain that is worse when you move, cough, or take a deep breath. Pain or discomfort that is also felt in your back, neck, jaw, shoulder, or arm, or pain that spreads to any of these areas. It is hard to know whether your pain is caused by something that is serious or something that is not so serious. So it is important to see your doctor rightaway if you have chest pain. Follow these instructions at home: Medicines Take over-the-counter and prescription medicines only as told by your doctor. If you were prescribed an antibiotic medicine, take it as told by your doctor. Do not stop taking the antibiotic even if you start to feel better. Lifestyle  Rest as told by your doctor. Do not use any products that contain nicotine or tobacco, such as cigarettes, e-cigarettes, and chewing tobacco. If you need help quitting, ask your doctor. Do not drink alcohol. Make lifestyle changes as told by your doctor. These may include: Getting regular exercise. Ask your doctor what activities are safe for you. Eating a heart-healthy diet. A diet and nutrition specialist (dietitian) can help you to learn healthy eating options. Staying at a healthy weight. Treating diabetes or high blood pressure, if needed. Lowering your stress.  Activities such as yoga and relaxation techniques can help.  General instructions Pay attention to any changes in your symptoms. Tell your doctor about them or any new symptoms. Avoid any activities that cause chest pain. Keep all follow-up visits as told by your doctor. This is important. You may need more testing if your chest pain does not go away. Contact a doctor if: Your chest pain does not go away. You feel depressed. You have a fever. Get help right away if: Your chest pain is worse. You have a cough that gets worse, or you cough up blood. You have very bad (severe) pain in your belly (abdomen). You pass out (faint). You have either of these for no clear reason: Sudden chest discomfort. Sudden discomfort in your arms, back, neck, or jaw. You have shortness of breath at any time. You suddenly start to sweat, or your skin gets clammy. You feel sick to your stomach (nauseous). You throw up (vomit). You suddenly feel lightheaded or dizzy. You feel very weak or tired. Your heart starts to beat fast, or it feels like it is skipping beats. These symptoms may be an emergency. Do not wait to see if the symptoms will go away. Get medical help right away. Call your local emergency services (911 in the [place] Do not drive yourself to the hospital. Summary Chest pain can be caused by many different conditions. The cause may be serious and need treatment right away. If you have chest pain, see your doctor right away. Follow your doctor's instructions for taking medicines and making lifestyle changes. Keep all follow-up  visits as told by your doctor. This includes visits for any further testing if your chest pain does not go away. Be sure to know the signs that show that your condition has become worse. Get help right away if you have these symptoms. This information is not intended to replace advice given to you by your health care provider. Make sure you discuss any questions you have with  your healthcare provider. Document Revised: 08/22/2017 Document Reviewed: 08/22/2017 Elsevier Patient Education  [place].   Cervical Radiculopathy  Cervical radiculopathy means that a nerve in the neck (a cervical nerve) is pinched or bruised. This can happen because of an injury to the cervical spine (vertebrae) in the neck, or as a normal part of getting older. This can cause pain or loss of feeling (numbness) that runs from your neck all the way down to your arm and fingers. Often, this condition gets better with rest. Treatment may be needed if the conditiondoes not get better. What are the causes? A neck injury. A bulging disk in your spine. Muscle movements that you cannot control (muscle spasms). Tight muscles in your neck due to overuse. Arthritis. Breakdown in the bones and joints of the spine (spondylosis) due to getting older. Bone spurs that form near the nerves in the neck. What are the signs or symptoms? Pain. The pain may: Run from the neck to the arm and hand. Be very bad or irritating. Be worse when you move your neck. Loss of feeling or tingling in your arm or hand. Weakness in your arm or hand, in very bad cases. How is this treated? In many cases, treatment is not needed for this condition. With rest, the condition often gets better over time. If treatment is needed, options may include: Wearing a soft neck collar (cervical collar) for short periods of time, as told by your doctor. Doing exercises (physical therapy) to strengthen your neck muscles. Taking medicines. Having shots (injections) in your spine, in very bad cases. Having surgery. This may be needed if other treatments do not help. The type of surgery that is used depends on the cause of your condition. Follow these instructions at home: If you have a soft neck collar: Wear it as told by your doctor. Remove it only as told by your doctor. Ask your doctor if you can remove the collar for  cleaning and bathing. If you are allowed to remove the collar for cleaning or bathing: Follow instructions from your doctor about how to remove the collar safely. Clean the collar by wiping it with mild soap and water and drying it completely. Take out any removable pads in the collar every 1-2 days. Wash them by hand with soap and water. Let them air-dry completely before you put them back in the collar. Check your skin under the collar for redness or sores. If you see any, tell your doctor. Managing pain     Take over-the-counter and prescription medicines only as told by your doctor. If told, put ice on the painful area. If you have a soft neck collar, remove it as told by your doctor. Put ice in a plastic bag. Place a towel between your skin and the bag. Leave the ice on for 20 minutes, 2-3 times a day. If using ice does not help, you can try using heat. Use the heat source that your doctor recommends, such as a moist heat pack or a heating pad. Place a towel between your skin and the heat  source. Leave the heat on for 20-30 minutes. Remove the heat if your skin turns bright red. This is very important if you are unable to feel pain, heat, or cold. You may have a greater risk of getting burned. You may try a gentle neck and shoulder rub (massage). Activity Rest as needed. Return to your normal activities as told by your doctor. Ask your doctor what activities are safe for you. Do exercises as told by your doctor or physical therapist. Do not lift anything that is heavier than 10 lb (4.5 kg) until your doctor tells you that it is safe. General instructions Use a flat pillow when you sleep. Do not drive while wearing a soft neck collar. If you do not have a soft neck collar, ask your doctor if it is safe to drive while your neck heals. Ask your doctor if the medicine prescribed to you requires you to avoid driving or using heavy machinery. Do not use any products that contain nicotine  or tobacco, such as cigarettes, e-cigarettes, and chewing tobacco. These can delay healing. If you need help quitting, ask your doctor. Keep all follow-up visits as told by your doctor. This is important. Contact a doctor if: Your condition does not get better with treatment. Get help right away if: Your pain gets worse and is not helped with medicine. You lose feeling or feel weak in your hand, arm, face, or leg. You have a high fever. You have a stiff neck. You cannot control when you poop or pee (have incontinence). You have trouble with walking, balance, or talking. Summary Cervical radiculopathy means that a nerve in the neck is pinched or bruised. A nerve can get pinched from a bulging disk, arthritis, an injury to the neck, or other causes. Symptoms include pain, tingling, or loss of feeling that goes from the neck into the arm or hand. Weakness in your arm or hand can happen in very bad cases. Treatment may include resting, wearing a soft neck collar, and doing exercises. You might need to take medicines for pain. In very bad cases, shots or surgery may be needed. This information is not intended to replace advice given to you by your health care provider. Make sure you discuss any questions you have with your healthcare provider. Document Revised: 01/10/2018 Document Reviewed: 01/10/2018 Elsevier Patient Education  2022 [employer].

## 2020-09-14 DIAGNOSIS — G4733 Obstructive sleep apnea (adult) (pediatric): Secondary | ICD-10-CM | POA: Diagnosis not present

## 2020-09-20 ENCOUNTER — Telehealth (INDEPENDENT_AMBULATORY_CARE_PROVIDER_SITE_OTHER): Payer: BC Managed Care – PPO | Admitting: Family Medicine

## 2020-09-20 ENCOUNTER — Other Ambulatory Visit: Payer: Self-pay

## 2020-09-20 DIAGNOSIS — U071 COVID-19: Secondary | ICD-10-CM

## 2020-09-20 MED ORDER — NIRMATRELVIR/RITONAVIR (PAXLOVID)TABLET: 3.0000 | ORAL_TABLET | Freq: Two times a day (BID) | ORAL | 0 refills | Status: AC

## 2020-09-20 NOTE — Progress Notes (Signed)
Patient ID: Kaitlin [name], female    DOB: September 17, 1955, 65 y.o.   MRN: [id number]   Chief Complaint  Patient presents with   Covid Positive    Scratchy throat , anxiety and chills  took tylenol Symptoms Started Sunday    Nasal Congestion   Subjective:  I connected with  Kaitlin [name] on 10/05/20 by a phone enabled telemedicine application and verified that I am speaking with the correct person using two identifiers.   I discussed the limitations of evaluation and management by telemedicine. The patient expressed understanding and agreed to proceed.  Patient location: home  Provider location: in office  I provided 15 minutes of non face - to - face time during this encounter.   HPI  Pt is positive covid test yesterday. Last night with throat pain, chills, congestion and coughing up sputum. No fever.  Having stomach upset.  No diarrhea or vomiting. Had lozenge.  Call back on Monday- let us know how feeling.  Had 3 vaccines for covid.    Medical History [name] has a past medical history of Anxiety, Colon polyp, Depression, Essential hypertension, Folliculitis (0/35/5974), Helicobacter pylori gastritis, Hypothyroidism, Irritable bowel syndrome, Kidney stones, Mixed hyperlipidemia, Mixed stress and urge urinary incontinence (09/26/2012), Obesity, Panic attacks, Reflux, and Tubulovillous adenoma (08/07/10).   Outpatient Encounter Medications as of 09/20/2020  Medication Sig   [EXPIRED] nirmatrelvir/ritonavir EUA (PAXLOVID) TABS Take 3 tablets by mouth 2 (two) times daily for 5 days. (Take nirmatrelvir 150 mg two tablets twice daily for 5 days and ritonavir 100 mg one tablet twice daily for 5 days) Patient GFR is 110.   aspirin EC 81 MG tablet Take 81 mg by mouth daily.   blood glucose meter kit and supplies Dispense based on patient and insurance preference. Test once a day E11.9   clonazePAM (KLONOPIN) 1 MG tablet Take 1 tablet (1 mg total) by mouth at bedtime.   CONTOUR  TEST test strip TEST ONCE DAILY AS DIRECTED   enalapril (VASOTEC) 20 MG tablet Take 1 tablet (20 mg total) by mouth daily.   escitalopram (LEXAPRO) 10 MG tablet Take 1 tablet (10 mg total) by mouth daily.   levothyroxine (SYNTHROID) 50 MCG tablet Take 1 tablet (50 mcg total) by mouth daily.   lovastatin (MEVACOR) 40 MG tablet Take 1 tablet (40 mg total) by mouth at bedtime.   Magnesium 250 MG TABS Take 250 mg by mouth daily.   Oxcarbazepine (TRILEPTAL) 300 MG tablet Take 300 mg by mouth 2 (two) times daily.    pantoprazole (PROTONIX) 40 MG tablet Take 1 tablet (40 mg total) by mouth 2 (two) times daily before a meal.   verapamil (CALAN-SR) 240 MG CR tablet Take 1 tablet (240 mg total) by mouth every morning. In the morning   [DISCONTINUED] furosemide (LASIX) 20 MG tablet Take 0.5 tablets (10 mg total) by mouth daily.   [DISCONTINUED] meloxicam (MOBIC) 15 MG tablet Take 1 tablet (15 mg total) by mouth daily.   No facility-administered encounter medications on file as of 09/20/2020.     Review of Systems  Constitutional:  Positive for chills. Negative for fever.  HENT:  Positive for congestion and sore throat. Negative for ear pain, rhinorrhea, sinus pressure and sinus pain.   Eyes:  Negative for pain, discharge and itching.  Respiratory:  Positive for cough.   Gastrointestinal:  Negative for constipation, diarrhea, nausea and vomiting.    Vitals There were no vitals taken for this visit.  Objective:  Physical Exam  No PE due to phone visit.  Assessment and Plan   1. COVID-19 - nirmatrelvir/ritonavir EUA (PAXLOVID) TABS; Take 3 tablets by mouth 2 (two) times daily for 5 days. (Take nirmatrelvir 150 mg two tablets twice daily for 5 days and ritonavir 100 mg one tablet twice daily for 5 days) Patient GFR is 110.  Dispense: 30 tablet; Refill: 0   Paxlovid 5 day medication ordered.  Reviewed side effects.  Stop lovastatin medication for 5 days.  Pt to use otc meds for coughing, inc  fluids and anti-pyretics prn. Pt to call or rto if not improving.  Return if symptoms worsen or fail to improve.

## 2020-09-25 DIAGNOSIS — E782 Mixed hyperlipidemia: Secondary | ICD-10-CM | POA: Insufficient documentation

## 2020-09-26 ENCOUNTER — Encounter: Payer: Self-pay | Admitting: Family Medicine

## 2020-09-26 ENCOUNTER — Telehealth: Payer: Self-pay | Admitting: Family Medicine

## 2020-09-26 NOTE — Telephone Encounter (Signed)
Pt is stating that Dr.Taylor would write her a note for being out of work. For patient returning back to work today 09/26/2020. Pt would like a call back when the note is ready to be picked up

## 2020-09-26 NOTE — Telephone Encounter (Signed)
   Yes, pls give the work note. Thx. Dr. Lovena Le

## 2020-10-05 ENCOUNTER — Telehealth: Payer: Self-pay | Admitting: Family Medicine

## 2020-10-05 ENCOUNTER — Other Ambulatory Visit: Payer: Self-pay | Admitting: Family Medicine

## 2020-10-05 DIAGNOSIS — M542 Cervicalgia: Secondary | ICD-10-CM

## 2020-10-05 NOTE — Telephone Encounter (Signed)
sent 

## 2020-10-05 NOTE — Telephone Encounter (Signed)
Patient had Covid on 7/19 but now still has bad cough wanting something called in for cough to [facility]

## 2020-10-05 NOTE — Telephone Encounter (Signed)
Would recommend pt having a visit to check lungs.  Urgent care or with [name].   Thanks,   Dr. Lovena Le

## 2020-10-05 NOTE — Telephone Encounter (Signed)
Pt contacted. Pt advised that next available appt would Friday at 2:50. Pt states she does not get off until 6; works from 2-6pm. Pt advised it may be in her best interest to go to Urgent Care to have lungs checked. Pt states "im not going to Urgent Care, I want to speak with my provider". Pt advised the next available appt would be Friday at 2:50. Pt verbalized understanding and accepted appt.

## 2020-10-07 ENCOUNTER — Ambulatory Visit: Payer: BC Managed Care – PPO | Admitting: Family Medicine

## 2020-10-07 ENCOUNTER — Other Ambulatory Visit: Payer: Self-pay

## 2020-10-07 VITALS — BP 120/84 | HR 75 | Temp 97.3°F | Ht 65.0 in | Wt 201.0 lb

## 2020-10-07 DIAGNOSIS — U071 COVID-19: Secondary | ICD-10-CM

## 2020-10-07 DIAGNOSIS — R058 Other specified cough: Secondary | ICD-10-CM | POA: Diagnosis not present

## 2020-10-07 MED ORDER — GUAIFENESIN-CODEINE 100-10 MG/5ML PO SOLN
5.0000 mL | Freq: Three times a day (TID) | ORAL | 0 refills | Status: DC | PRN
Start: 2020-10-07 — End: 2021-01-10

## 2020-10-07 NOTE — Progress Notes (Signed)
 Patient ID: Kaitlin [name], female    DOB: 06/28/1955, 65 y.o.   MRN: [id number]   Chief Complaint  Patient presents with    continued cough S/p Covid infection 09/20/20   Subjective:    HPI Cc- coughing The coughing is having coughing fits.  Having some nausea with it. Dry coughing. Took paxlovid and finished it. Sore in the chest from coughing. No fever.  On cpap machine at night and hard to cough a lot.  Long time ago used inhaler.  Coughing a lot and horse.   Medical History Alleyah has a past medical history of Anxiety, Colon polyp, Depression, Essential hypertension, Folliculitis (09/26/2012), Helicobacter pylori gastritis, Hypothyroidism, Irritable bowel syndrome, Kidney stones, Mixed hyperlipidemia, Mixed stress and urge urinary incontinence (09/26/2012), Obesity, Panic attacks, Reflux, and Tubulovillous adenoma (08/07/10).   Outpatient Encounter Medications as of 10/07/2020  Medication Sig   guaiFENesin-codeine 100-10 MG/5ML syrup Take 5 mLs by mouth 3 (three) times daily as needed for cough.   aspirin EC 81 MG tablet Take 81 mg by mouth daily.   blood glucose meter kit and supplies Dispense based on patient and insurance preference. Test once a day E11.9   clonazePAM (KLONOPIN) 1 MG tablet Take 1 tablet (1 mg total) by mouth at bedtime.   CONTOUR TEST test strip TEST ONCE DAILY AS DIRECTED   enalapril (VASOTEC) 20 MG tablet Take 1 tablet (20 mg total) by mouth daily.   escitalopram (LEXAPRO) 10 MG tablet Take 1 tablet (10 mg total) by mouth daily.   furosemide (LASIX) 20 MG tablet TAKE 1/2 TABLET BY MOUTH DAILY   levothyroxine (SYNTHROID) 50 MCG tablet Take 1 tablet (50 mcg total) by mouth daily.   lovastatin (MEVACOR) 40 MG tablet Take 1 tablet (40 mg total) by mouth at bedtime.   Magnesium 250 MG TABS Take 250 mg by mouth daily.   meloxicam (MOBIC) 15 MG tablet TAKE 1 TABLET (15 MG TOTAL) BY MOUTH DAILY.   Oxcarbazepine (TRILEPTAL) 300 MG tablet Take 300 mg by mouth  2 (two) times daily.    pantoprazole (PROTONIX) 40 MG tablet Take 1 tablet (40 mg total) by mouth 2 (two) times daily before a meal.   verapamil (CALAN-SR) 240 MG CR tablet Take 1 tablet (240 mg total) by mouth every morning. In the morning   No facility-administered encounter medications on file as of 10/07/2020.     Review of Systems  Constitutional:  Negative for chills and fever.  HENT:  Positive for voice change. Negative for congestion, rhinorrhea and sore throat.   Respiratory:  Positive for cough. Negative for shortness of breath and wheezing.   Cardiovascular:  Negative for chest pain and leg swelling.  Gastrointestinal:  Negative for abdominal pain, diarrhea, nausea and vomiting.  Genitourinary:  Negative for dysuria and frequency.  Musculoskeletal:  Negative for arthralgias and back pain.  Skin:  Negative for rash.  Neurological:  Negative for dizziness, weakness and headaches.    Vitals BP 120/84   Pulse 75   Temp (!) 97.3 F (36.3 C) (Oral)   Ht 5' 5" (1.651 m)   Wt 201 lb (91.2 kg)   SpO2 97%   BMI 33.45 kg/m   Objective:   Physical Exam Vitals and nursing note reviewed.  Constitutional:      General: She is not in acute distress.    Appearance: Normal appearance. She is not ill-appearing or toxic-appearing.  HENT:     Head: Normocephalic and atraumatic.       Right Ear: Tympanic membrane, ear canal and external ear normal.     Left Ear: Tympanic membrane, ear canal and external ear normal.     Nose: Nose normal. No congestion or rhinorrhea.     Mouth/Throat:     Mouth: Mucous membranes are moist.     Pharynx: Oropharynx is clear. No oropharyngeal exudate or posterior oropharyngeal erythema.  Eyes:     Extraocular Movements: Extraocular movements intact.     Conjunctiva/sclera: Conjunctivae normal.     Pupils: Pupils are equal, round, and reactive to light.  Cardiovascular:     Rate and Rhythm: Normal rate and regular rhythm.     Pulses: Normal pulses.      Heart sounds: Normal heart sounds.  Pulmonary:     Effort: Pulmonary effort is normal. No respiratory distress.     Breath sounds: Normal breath sounds. No wheezing, rhonchi or rales.  Musculoskeletal:     Cervical back: Normal range of motion.  Lymphadenopathy:     Cervical: No cervical adenopathy.  Skin:    General: Skin is warm and dry.     Findings: No rash.  Neurological:     Mental Status: She is alert and oriented to person, place, and time.  Psychiatric:        Mood and Affect: Mood normal.        Behavior: Behavior normal.     Assessment and Plan   1. Post-viral cough syndrome - guaiFENesin-codeine 100-10 MG/5ML syrup; Take 5 mLs by mouth 3 (three) times daily as needed for cough.  Dispense: 120 mL; Refill: 0  2. COVID-19   Post viral coughing- Offered tessalon perles, inahler and cough syrup.  Pt decided just wanting to try the cough syrup to see if will help at night with her sleep.  Pt to call back in next 2-3 days if worsening or not improving. Gave urgent care precautions.   No follow-ups on file.  

## 2020-10-11 ENCOUNTER — Telehealth: Payer: Self-pay

## 2020-10-11 NOTE — Telephone Encounter (Signed)
Talked with patient and she had asked Dr Posey Pronto to reconsider new PCP.  He said ok but when I called to make appt she said she can not stop taking Klonopin.  She said she will look for another provider.

## 2020-10-12 DIAGNOSIS — G4733 Obstructive sleep apnea (adult) (pediatric): Secondary | ICD-10-CM | POA: Diagnosis not present

## 2020-10-24 ENCOUNTER — Ambulatory Visit (HOSPITAL_COMMUNITY)
Admission: RE | Admit: 2020-10-24 | Discharge: 2020-10-24 | Disposition: A | Discharge status: Home / Self Care | Payer: BC Managed Care – PPO | Source: Ambulatory Visit | Attending: Family Medicine | Admitting: Family Medicine

## 2020-10-24 ENCOUNTER — Other Ambulatory Visit: Payer: Self-pay

## 2020-10-24 DIAGNOSIS — M542 Cervicalgia: Secondary | ICD-10-CM | POA: Insufficient documentation

## 2020-10-24 IMAGING — DX DG CERVICAL SPINE COMPLETE 4+V
6 series · 6 of 6 positions shown · non-contrast
Comparison: None.

CLINICAL DATA: Left neck pain

EXAM:
CERVICAL SPINE - COMPLETE 4+ VIEW

[c-spine lat]
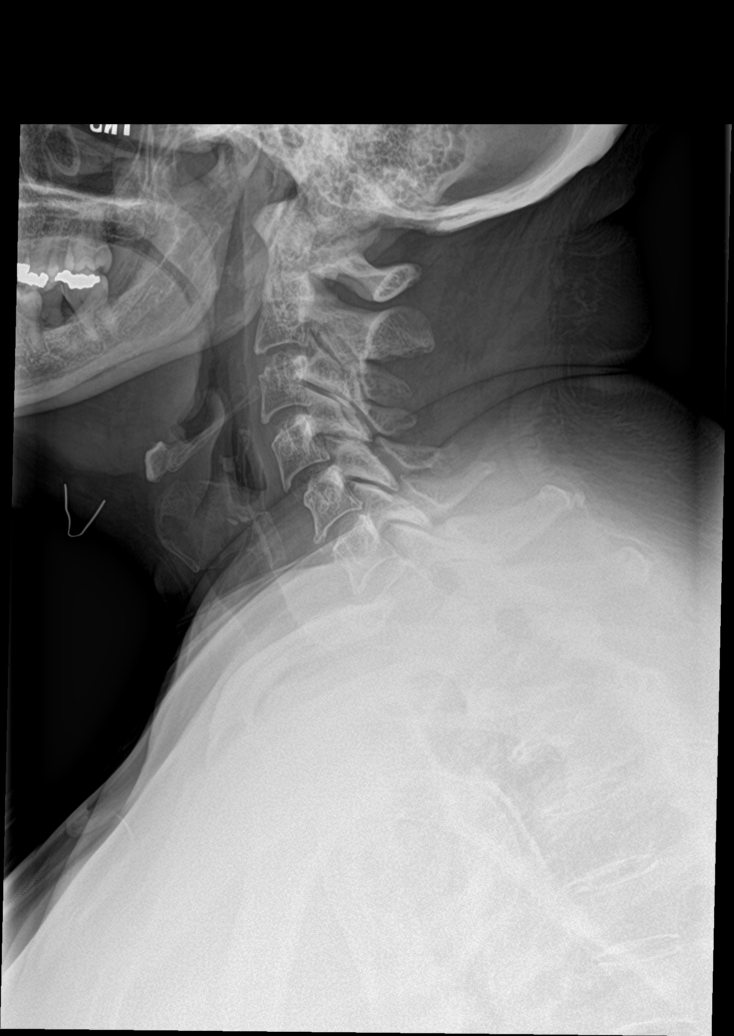

[c-spine obl (1 of 2)]
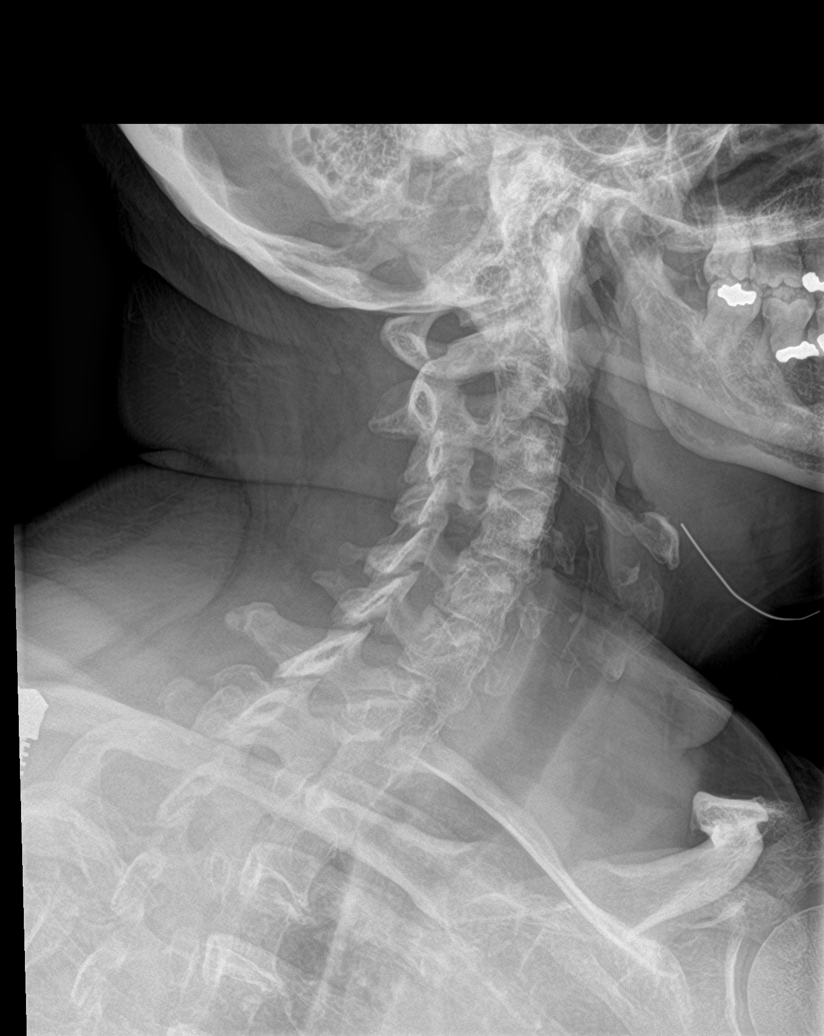

[c-spine obl (2 of 2)]
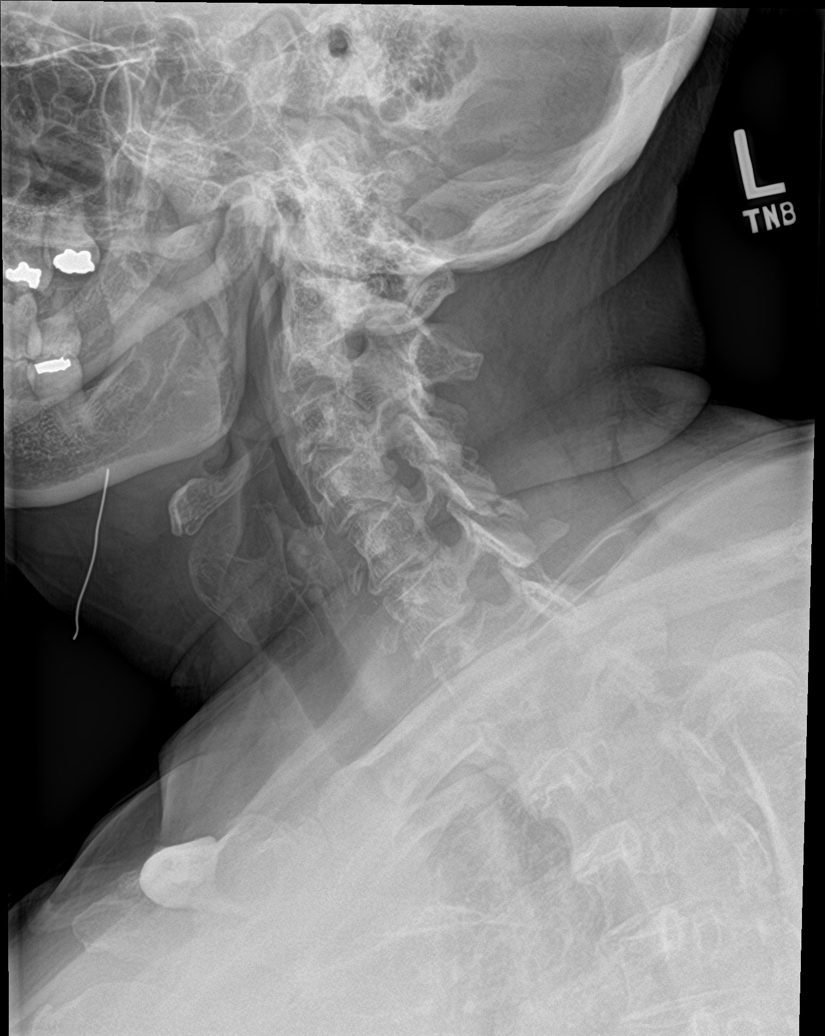

[c-spine ap]
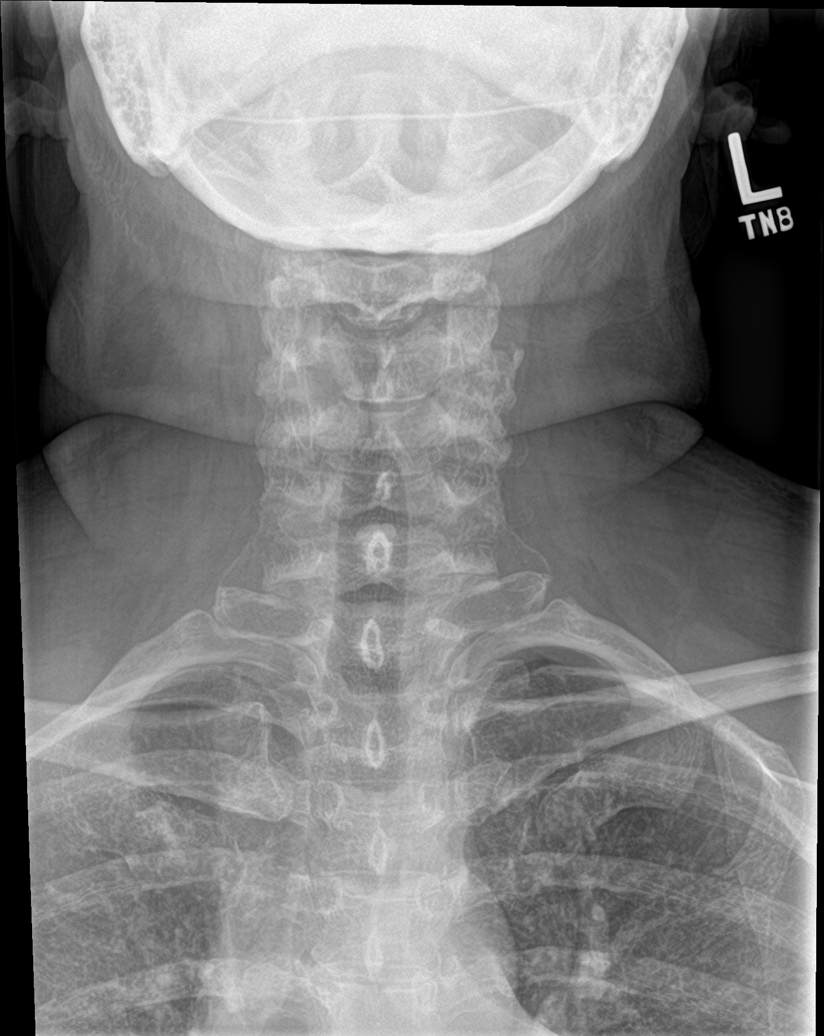

[c-spine open mouth]
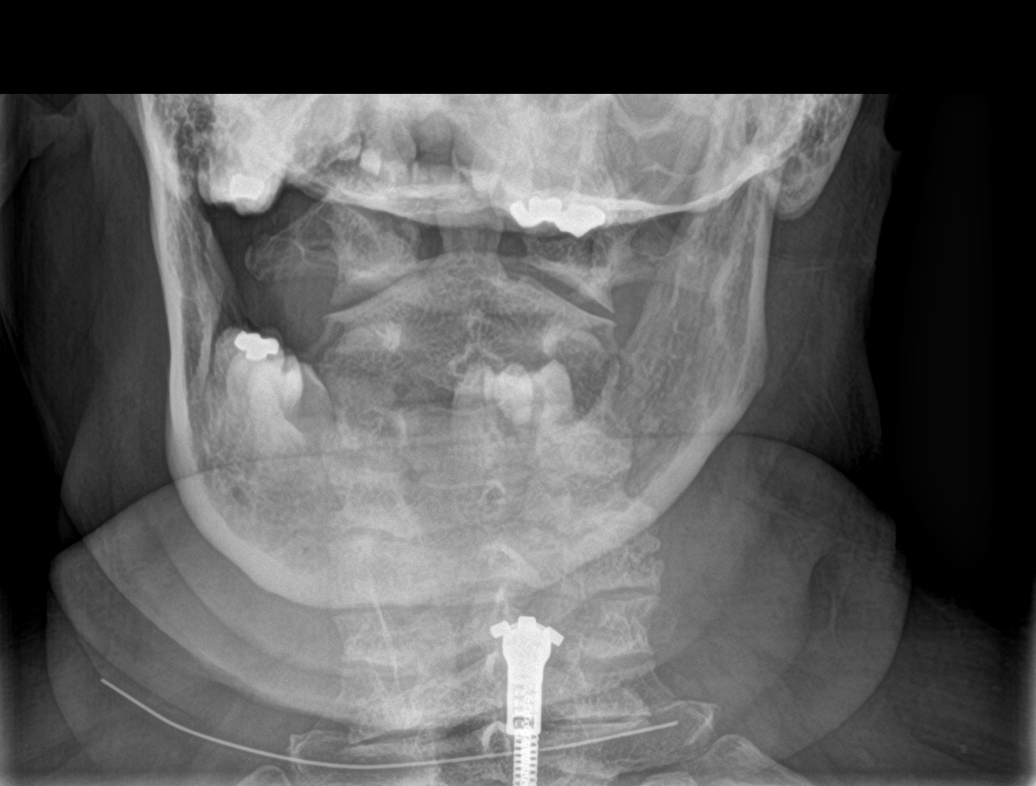

[c-spine swimmers trauma]
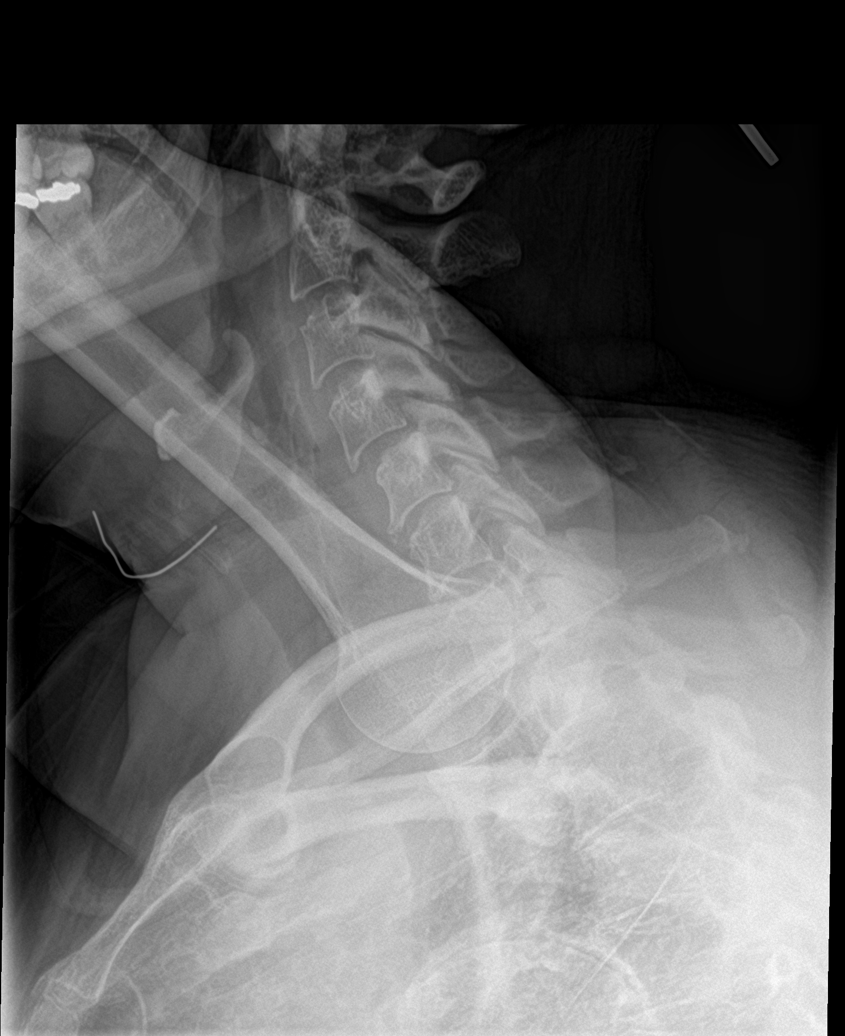

[6 of 6 positions shown; findings below may reference images not displayed]

FINDINGS: Normal cervical lordosis. 2 mm anterolisthesis of C4 upon C5 is
likely degenerative in nature. No acute fracture or traumatic
listhesis of the cervical spine. Lateral masses of C1 are well
aligned with the body of C2. Spinal canal is widely patent.
Prevertebral soft tissues are not thickened. Right neural foramina
are widely patent. Advanced facet arthrosis on the left at C3-4 and
C4-5 results in at least mild left neuroforaminal narrowing at C4-5.
Left C3-4 neural foramina is not well profiled due to obliquity.
IMPRESSION: No acute fracture or listhesis.

Asymmetric left facet arthrosis C3-C5. This may be better assessed
with dedicated MRI examination

## 2020-10-28 NOTE — Telephone Encounter (Signed)
Patient states it hurts when she turns her neck- no tingling or numbness. She states she gets a little relief from the Mobic but she cant sleep good because her neck hurts when she turns her neck. Patient stated she is seeing her neurologist on November 03, 2020

## 2020-10-28 NOTE — Telephone Encounter (Signed)
Pls call pt to get more info, about what is "bad" about the neck?   Numbness tingilging?  Pain sharp stabbing?  What makes worse or better?  How much relief with mobic?   Thx.  Dr. Lovena Le

## 2020-10-30 MED ORDER — CYCLOBENZAPRINE HCL 10 MG PO TABS: 10.0000 mg | ORAL_TABLET | Freq: Every day | ORAL | 0 refills | Status: DC

## 2020-11-03 DIAGNOSIS — M5481 Occipital neuralgia: Secondary | ICD-10-CM | POA: Diagnosis not present

## 2020-11-03 DIAGNOSIS — G501 Atypical facial pain: Secondary | ICD-10-CM | POA: Diagnosis not present

## 2020-11-03 DIAGNOSIS — R519 Headache, unspecified: Secondary | ICD-10-CM | POA: Diagnosis not present

## 2020-11-03 DIAGNOSIS — G4733 Obstructive sleep apnea (adult) (pediatric): Secondary | ICD-10-CM | POA: Diagnosis not present

## 2020-11-09 ENCOUNTER — Other Ambulatory Visit: Payer: Self-pay | Admitting: Neurology

## 2020-11-09 ENCOUNTER — Other Ambulatory Visit: Payer: Self-pay | Admitting: *Deleted

## 2020-11-09 DIAGNOSIS — R519 Headache, unspecified: Secondary | ICD-10-CM

## 2020-11-09 DIAGNOSIS — M5481 Occipital neuralgia: Secondary | ICD-10-CM

## 2020-11-09 MED ORDER — CONTOUR TEST VI STRP: ORAL_STRIP | 0 refills | Status: DC

## 2020-11-16 DIAGNOSIS — L218 Other seborrheic dermatitis: Secondary | ICD-10-CM | POA: Diagnosis not present

## 2020-11-16 DIAGNOSIS — S40861A Insect bite (nonvenomous) of right upper arm, initial encounter: Secondary | ICD-10-CM | POA: Diagnosis not present

## 2020-12-01 ENCOUNTER — Other Ambulatory Visit: Payer: Self-pay | Admitting: Family Medicine

## 2020-12-05 DIAGNOSIS — Z23 Encounter for immunization: Secondary | ICD-10-CM | POA: Diagnosis not present

## 2020-12-12 ENCOUNTER — Telehealth: Payer: Self-pay | Admitting: Family Medicine

## 2020-12-12 NOTE — Telephone Encounter (Signed)
Patient states she is out of her BP meds. She has made an appointment with Dr. Lacinda Axon for 11/8 but needs meds to hold her over until appointment.   [facility]  CB# [phone]

## 2020-12-12 NOTE — Telephone Encounter (Signed)
Patient has 3 months worth refills available on both blood pressure meds per [facility]  Left message to return call

## 2020-12-12 NOTE — Telephone Encounter (Signed)
Patient notified and stated she will run by pharmacy.

## 2020-12-14 ENCOUNTER — Other Ambulatory Visit: Payer: Self-pay

## 2020-12-14 ENCOUNTER — Ambulatory Visit: Payer: BC Managed Care – PPO

## 2020-12-14 DIAGNOSIS — Z23 Encounter for immunization: Secondary | ICD-10-CM

## 2020-12-29 ENCOUNTER — Other Ambulatory Visit: Payer: Self-pay

## 2020-12-29 ENCOUNTER — Ambulatory Visit: Payer: BC Managed Care – PPO | Admitting: Nurse Practitioner

## 2020-12-29 DIAGNOSIS — Z23 Encounter for immunization: Secondary | ICD-10-CM

## 2021-01-04 NOTE — Progress Notes (Signed)
TDAp vaccine needed

## 2021-01-10 ENCOUNTER — Other Ambulatory Visit: Payer: Self-pay

## 2021-01-10 ENCOUNTER — Ambulatory Visit: Payer: BC Managed Care – PPO | Admitting: Family Medicine

## 2021-01-10 VITALS — BP 130/72 | HR 71 | Temp 97.4°F | Ht 65.0 in | Wt 197.0 lb

## 2021-01-10 DIAGNOSIS — M7918 Myalgia, other site: Secondary | ICD-10-CM | POA: Diagnosis not present

## 2021-01-10 DIAGNOSIS — F419 Anxiety disorder, unspecified: Secondary | ICD-10-CM

## 2021-01-10 DIAGNOSIS — E119 Type 2 diabetes mellitus without complications: Secondary | ICD-10-CM

## 2021-01-10 DIAGNOSIS — E782 Mixed hyperlipidemia: Secondary | ICD-10-CM | POA: Diagnosis not present

## 2021-01-10 DIAGNOSIS — I10 Essential (primary) hypertension: Secondary | ICD-10-CM

## 2021-01-10 DIAGNOSIS — F331 Major depressive disorder, recurrent, moderate: Secondary | ICD-10-CM

## 2021-01-10 DIAGNOSIS — Z130 Encounter for screening for diseases of the blood and blood-forming organs and certain disorders involving the immune mechanism: Secondary | ICD-10-CM

## 2021-01-10 DIAGNOSIS — M722 Plantar fascial fibromatosis: Secondary | ICD-10-CM

## 2021-01-10 DIAGNOSIS — E039 Hypothyroidism, unspecified: Secondary | ICD-10-CM

## 2021-01-10 MED ORDER — ESCITALOPRAM OXALATE 10 MG PO TABS: 10.0000 mg | ORAL_TABLET | Freq: Every day | ORAL | 5 refills | Status: AC

## 2021-01-10 MED ORDER — FUROSEMIDE 20 MG PO TABS: 10.0000 mg | ORAL_TABLET | Freq: Every day | ORAL | 2 refills | Status: DC

## 2021-01-10 MED ORDER — VERAPAMIL HCL ER 240 MG PO TBCR: 240.0000 mg | EXTENDED_RELEASE_TABLET | Freq: Every morning | ORAL | 5 refills | Status: DC

## 2021-01-10 NOTE — Assessment & Plan Note (Signed)
Advised ice, shoe inserts or heel cups, NSAIDs.

## 2021-01-10 NOTE — Patient Instructions (Signed)
Please go to PT for the neck pain. This is muscular in nature.  Use the Mobic daily as needed for the plantar fasciitis.  Labs today.   Follow up in 6 months.  Take care  Dr. Lacinda Axon

## 2021-01-10 NOTE — Progress Notes (Signed)
Subjective:  Patient ID: Kaitlin [name], female    DOB: 05-14-1955  Age: 65 y.o. MRN: [id number]  CC: Chief Complaint  Patient presents with   Establish Care   Fatigue    Fatigue, Cold chills  Neck pain,  foot pain- being sent to physical therapy - patient not sure of reason why she needs PT    HPI:  65 year female with an extensive PMH presents for follow up.  Chronic neck pain Patient reports ongoing left-sided neck pain for the past 3 months. Located at the left trapezius.  She reports decreased range of motion of the neck. She follows with neurology and has been ordered to go to physical therapy.  She has not done so.  She states that she has not done physical therapy as she has not been told what is wrong with her neck. No relieving factors.  Foot pain Patient reports a 3-week history of pain to the right foot at the heel. She reports that it is more painful with ambulation.  She states that she is on her feet for work. No medications or interventions tried. No known relieving factors.   DM-2 Diet controlled.  Last A1c was 6.5. She is on no medications at this time.  Hypertension Blood pressures are well controlled on enalapril, Lasix, and verapamil.  Anxiety Patient reports that her anxiety is not controlled on Klonopin.  She was previously changed to clonazepam from alprazolam.  Patient is requesting to go back on her alprazolam as it "worked better". Per review of electronic medical record, this has been an ongoing issue.   Patient Active Problem List   Diagnosis Date Noted   Hypercalcemia 01/10/2021   Anxiety 01/10/2021   Musculoskeletal pain 01/10/2021   Plantar fasciitis 01/10/2021   Mixed hyperlipidemia 09/25/2020   Major depression, recurrent, chronic ([place]) 09/30/2019   Osteoarthritis of knee 04/07/2018   Loss of weight 05/27/2017   PTSD (post-traumatic stress disorder) 04/09/2017   MDD (major depressive disorder), recurrent episode, moderate ([place])  04/09/2017   Diabetes mellitus type 2, diet-controlled ([place]) 12/20/2016   Chondromalacia of left knee 12/11/2016   GERD (gastroesophageal reflux disease) 02/20/2016   Hiatal hernia    Mixed stress and urge urinary incontinence 09/26/2012   Subserous leiomyoma of uterus 09/01/2012   Hypothyroidism 06/12/2012   Venous stasis 06/12/2012   Essential hypertension, benign 12/11/2010   IBS (irritable bowel syndrome) 07/05/2010   Hx of adenomatous colonic polyps 07/05/2010    Social Hx   Social History   Socioeconomic History   Marital status: Married    Spouse name: Not on file   Number of children: 1   Years of education: Not on file   Highest education level: Not on file  Occupational History   Not on file  Tobacco Use   Smoking status: Never   Smokeless tobacco: Never   Tobacco comments:    Never Smoked  Vaping Use   Vaping Use: Never used  Substance and Sexual Activity   Alcohol use: Not Currently    Comment: 2 glasses of wine a week   Drug use: No   Sexual activity: Not Currently    Birth control/protection: Post-menopausal  Other Topics Concern   Not on file  Social History Narrative   Not on file   Social Determinants of Health   Financial Resource Strain: Low Risk    Difficulty of Paying Living Expenses: Not hard at all  Food Insecurity: No Food Insecurity   Worried About  Running Out of Food in the Last Year: Never true   Ran Out of Food in the Last Year: Never true  Transportation Needs: No Transportation Needs   Lack of Transportation (Medical): No   Lack of Transportation (Non-Medical): No  Physical Activity: Inactive   Days of Exercise per Week: 0 days   Minutes of Exercise per Session: 0 min  Stress: Stress Concern Present   Feeling of Stress : Rather much  Social Connections: Unknown   Frequency of Communication with Friends and Family: Three times a week   Frequency of Social Gatherings with Friends and Family: Never   Attends Religious Services:  Patient refused   Marine scientist or Organizations: Yes   Attends Archivist Meetings: Never   Marital Status: Married    Review of Systems Per HPI  Objective:  BP 130/72   Pulse 71   Temp (!) 97.4 F (36.3 C)   Ht _0  (1.651 m)   Wt 197 lb (89.4 kg)   SpO2 96%   BMI 32.78 kg/m   BP/Weight 01/10/2021 05/05/9922 04/11/8339  Systolic BP 962 229 798  Diastolic BP 72 84 78  Wt. (Lbs) 197 201 201  BMI 32.78 33.45 33.45  Some encounter information is confidential and restricted. Go to Review Flowsheets activity to see all data.    Physical Exam Constitutional:      General: She is not in acute distress.    Appearance: Normal appearance. She is obese. She is not ill-appearing.  HENT:     Head: Normocephalic and atraumatic.  Eyes:     General:        Right eye: No discharge.        Left eye: No discharge.     Conjunctiva/sclera: Conjunctivae normal.  Neck:     Comments: Tenderness over the left trapezius.  Decreased range of motion of the neck. Cardiovascular:     Rate and Rhythm: Normal rate and regular rhythm.  Pulmonary:     Effort: Pulmonary effort is normal.     Breath sounds: Normal breath sounds. No wheezing, rhonchi or rales.  Musculoskeletal:     Comments: Right foot with tenderness over the plantar aspect of the heel at the attachment site of the plantar fascia.  Neurological:     Mental Status: She is alert.  Psychiatric:        Mood and Affect: Mood normal.        Behavior: Behavior normal.    Lab Results  Component Value Date   WBC 7.1 08/02/2020   HGB 13.4 08/02/2020   HCT 40.8 08/02/2020   PLT 272 08/02/2020   GLUCOSE 115 (H) 08/02/2020   CHOL 175 08/02/2020   TRIG 84 08/02/2020   HDL 58 08/02/2020   LDLCALC 101 (H) 08/02/2020   ALT 30 08/02/2020   AST 27 08/02/2020   NA 139 08/02/2020   K 4.6 08/02/2020   CL 106 08/02/2020   CREATININE 0.70 08/02/2020   BUN 9 08/02/2020   CO2 20 08/02/2020   TSH 2.160 02/08/2020   HGBA1C  6.5 (H) 08/02/2020   MICROALBUR 0.4 02/20/2016     Assessment & Plan:   Problem List Items Addressed This Visit       Cardiovascular and Mediastinum   Essential hypertension, benign    Stable.  Continue current medications.      Relevant Medications   verapamil (CALAN-SR) 240 MG CR tablet   furosemide (LASIX) 20 MG tablet   Other  Relevant Orders   CMP14+EGFR     Endocrine   Hypothyroidism   Relevant Orders   TSH   Diabetes mellitus type 2, diet-controlled ([place])    Has been diet controlled.  A1c today.      Relevant Orders   Hemoglobin A1c     Musculoskeletal and Integument   Plantar fasciitis    Advised ice, shoe inserts or heel cups, NSAIDs.        Other   Musculoskeletal pain - Primary    Patient experiencing the trapezius tension and spasm.  Advised that she should proceed with physical therapy as this is MSK in nature.      Mixed hyperlipidemia    Rechecking lipid panel today.  Last LDL was 101.  She is currently on lovastatin.  Given diabetes and other risk factors, we will plan on escalating statin therapy after lipid panel is back.      Relevant Medications   verapamil (CALAN-SR) 240 MG CR tablet   furosemide (LASIX) 20 MG tablet   Other Relevant Orders   Lipid panel   MDD (major depressive disorder), recurrent episode, moderate ([facility])   Relevant Medications   escitalopram (LEXAPRO) 10 MG tablet   Hypercalcemia    Rechecking today.  Has longstanding history of hypercalcemia.  Previously seen by endocrinology.  Thought to be [place] versus mild primary hyperparathyroidism.  We will continue to monitor closely.      Anxiety    We will continue Klonopin.  Discussed with the patient that I will make no changes at this time.      Relevant Medications   escitalopram (LEXAPRO) 10 MG tablet   Other Visit Diagnoses     Screening for deficiency anemia       Relevant Orders   CBC       Meds ordered this encounter  Medications   verapamil (CALAN-SR)  240 MG CR tablet    Sig: Take 1 tablet (240 mg total) by mouth every morning. In the morning    Dispense:  30 tablet    Refill:  5    Put on file, if has refills.   escitalopram (LEXAPRO) 10 MG tablet    Sig: Take 1 tablet (10 mg total) by mouth daily.    Dispense:  30 tablet    Refill:  5   furosemide (LASIX) 20 MG tablet    Sig: Take 0.5 tablets (10 mg total) by mouth daily.    Dispense:  30 tablet    Refill:  2    Follow-up:  Return in about 6 months (around 07/10/2021) for Follow up Chronic medical issues.  [place]

## 2021-01-10 NOTE — Assessment & Plan Note (Signed)
Rechecking today.  Has longstanding history of hypercalcemia.  Previously seen by endocrinology.  Thought to be [place] versus mild primary hyperparathyroidism.  We will continue to monitor closely.

## 2021-01-10 NOTE — Assessment & Plan Note (Signed)
Has been diet controlled.  A1c today.

## 2021-01-10 NOTE — Assessment & Plan Note (Signed)
We will continue Klonopin.  Discussed with the patient that I will make no changes at this time.

## 2021-01-10 NOTE — Assessment & Plan Note (Signed)
Rechecking lipid panel today.  Last LDL was 101.  She is currently on lovastatin.  Given diabetes and other risk factors, we will plan on escalating statin therapy after lipid panel is back.

## 2021-01-10 NOTE — Assessment & Plan Note (Signed)
Stable.  Continue current medications.

## 2021-01-10 NOTE — Assessment & Plan Note (Signed)
Patient experiencing the trapezius tension and spasm.  Advised that she should proceed with physical therapy as this is MSK in nature.

## 2021-01-11 LAB — CMP14+EGFR
ALT: 34 IU/L — ABNORMAL HIGH (ref 0–32)
AST: 34 IU/L (ref 0–40)
Albumin/Globulin Ratio: 1.8 (ref 1.2–2.2)
Albumin: 5 g/dL — ABNORMAL HIGH (ref 3.8–4.8)
Alkaline Phosphatase: 73 IU/L (ref 44–121)
BUN/Creatinine Ratio: 21 (ref 12–28)
BUN: 15 mg/dL (ref 8–27)
Bilirubin Total: 0.4 mg/dL (ref 0.0–1.2)
CO2: 23 mmol/L (ref 20–29)
Calcium: 10.9 mg/dL — ABNORMAL HIGH (ref 8.7–10.3)
Chloride: 100 mmol/L (ref 96–106)
Creatinine, Ser: 0.73 mg/dL (ref 0.57–1.00)
Globulin, Total: 2.8 g/dL (ref 1.5–4.5)
Glucose: 113 mg/dL — ABNORMAL HIGH (ref 70–99)
Potassium: 4.4 mmol/L (ref 3.5–5.2)
Sodium: 136 mmol/L (ref 134–144)
Total Protein: 7.8 g/dL (ref 6.0–8.5)
eGFR: 92 mL/min/{1.73_m2} (ref >59)

## 2021-01-11 LAB — LIPID PANEL
Chol/HDL Ratio: 3.4 ratio (ref 0.0–4.4)
Cholesterol, Total: 199 mg/dL (ref 100–199)
HDL: 59 mg/dL (ref >39)
LDL Chol Calc (NIH): 123 mg/dL — ABNORMAL HIGH (ref 0–99)
Triglycerides: 96 mg/dL (ref 0–149)
VLDL Cholesterol Cal: 17 mg/dL (ref 5–40)

## 2021-01-11 LAB — CBC
Hematocrit: 40.9 % (ref 34.0–46.6)
Hemoglobin: 13.9 g/dL (ref 11.1–15.9)
MCH: 28.7 pg (ref 26.6–33.0)
MCHC: 34 g/dL (ref 31.5–35.7)
MCV: 85 fL (ref 79–97)
Platelets: 282 10*3/uL (ref 150–450)
RBC: 4.84 x10E6/uL (ref 3.77–5.28)
RDW: 12.2 % (ref 11.7–15.4)
WBC: 8.2 10*3/uL (ref 3.4–10.8)

## 2021-01-11 LAB — HEMOGLOBIN A1C
Est. average glucose Bld gHb Est-mCnc: 140 mg/dL
Hgb A1c MFr Bld: 6.5 % — ABNORMAL HIGH (ref 4.8–5.6)

## 2021-01-11 LAB — TSH: TSH: 4.01 u[IU]/mL (ref 0.450–4.500)

## 2021-01-17 ENCOUNTER — Other Ambulatory Visit: Payer: Self-pay | Admitting: Family Medicine

## 2021-01-17 MED ORDER — ROSUVASTATIN CALCIUM 10 MG PO TABS: 10.0000 mg | ORAL_TABLET | Freq: Every day | ORAL | 1 refills | Status: AC

## 2021-01-31 ENCOUNTER — Other Ambulatory Visit: Payer: Self-pay | Admitting: Neurology

## 2021-01-31 DIAGNOSIS — R519 Headache, unspecified: Secondary | ICD-10-CM

## 2021-02-02 ENCOUNTER — Other Ambulatory Visit: Payer: Self-pay | Admitting: Family Medicine

## 2021-02-02 DIAGNOSIS — F331 Major depressive disorder, recurrent, moderate: Secondary | ICD-10-CM

## 2021-02-02 DIAGNOSIS — F419 Anxiety disorder, unspecified: Secondary | ICD-10-CM

## 2021-02-02 MED ORDER — CLONAZEPAM 1 MG PO TABS
1.0000 mg | ORAL_TABLET | Freq: Every day | ORAL | 5 refills | Status: DC
Start: 2021-02-02 — End: 2021-10-13

## 2021-02-02 NOTE — Telephone Encounter (Signed)
Patient has MRI on 12/4 and needing refill on clon[facility]ep[facility]

## 2021-02-03 ENCOUNTER — Encounter: Payer: Self-pay | Admitting: Internal Medicine

## 2021-02-03 ENCOUNTER — Ambulatory Visit: Payer: BC Managed Care – PPO | Admitting: Internal Medicine

## 2021-02-03 ENCOUNTER — Other Ambulatory Visit: Payer: Self-pay

## 2021-02-03 VITALS — BP 142/68 | HR 83 | Temp 97.3°F | Ht 65.0 in | Wt 199.8 lb

## 2021-02-03 DIAGNOSIS — K219 Gastro-esophageal reflux disease without esophagitis: Secondary | ICD-10-CM | POA: Diagnosis not present

## 2021-02-03 DIAGNOSIS — K921 Melena: Secondary | ICD-10-CM | POA: Diagnosis not present

## 2021-02-03 NOTE — Progress Notes (Signed)
Primary Care Physician:  Tommie Sams, DO Primary Gastroenterologist:  Dr. Jena Gauss  Pre-Procedure History & Physical: HPI:  Kaitlin [name] is a 65 y.o. female here for to further evaluate an episode of rectal discomfort and single episode of toilet tissue hematochezia.  Has been sitting on a hard seat recently at work.  Occasionally constipated.  Occasionally has to pass a hard stool.  Uses MiraLAX on occasion. This is only a bleeding she has had and this was several weeks ago.  History of advanced adenoma multiple colonic adenomas removed over time last colonoscopy 2 years ago-normal rectum 2 small adenomas removed.  See me surveillance due 2025.  No abdominal pain no upper GI tract symptoms.  Her GERD is well controlled on pantoprazole 40 mg once daily.  No dysphagia.  Past Medical History:  Diagnosis Date   Anxiety    Colon polyp    April 2008   Depression    Essential hypertension    Folliculitis 09/26/2012   Helicobacter pylori gastritis    AUG 2008 PREVPAC: nausea-->HELIDAC: GI UPSET   Hypothyroidism    Irritable bowel syndrome    Kidney stones    Mixed hyperlipidemia    Mixed stress and urge urinary incontinence 09/26/2012   Obesity    Panic attacks    Reflux    Tubulovillous adenoma 08/07/10   Colonoscopy w/ Dr Darrick Penna w/ 7 polyps removed-bx showed tubular adenomas.  Largest 1cm-hepatic flexure polyp->TA.      Past Surgical History:  Procedure Laterality Date   CHOLECYSTECTOMY     COLONOSCOPY  APR 2008   AC/[place] SIMPLE ADENOMA   COLONOSCOPY  08/07/2010   Dr. Lorane Gell adenomas and tubulovillous adenoma; needs surveillance 2015   COLONOSCOPY N/A 09/18/2013   Dr.Abdulloh Ullom- normal rectum, 2 diminutive polys in the mid sigmoid segment o/w the remainder of the colonic mucosa appeared normal.hyperplastic poylps   COLONOSCOPY WITH PROPOFOL N/A 02/23/2019   Procedure: COLONOSCOPY WITH PROPOFOL;  Surgeon: Corbin Ade, MD;  Location: [facility];  Service: Endoscopy;   Laterality: N/A;  2:45pm - office unable to reach pt to move her up   ESOPHAGOGASTRODUODENOSCOPY N/A 11/29/2014   RMR: Gastric erosions. Small hiatal hernia. Status post biopsy.    MASS EXCISION  02/28/2011   Procedure: EXCISION MASS;  Surgeon: Fabio Bering, MD;  Location: [facility];  Service: General;  Laterality: Right;  soft tissue mass   POLYPECTOMY  02/23/2019   Procedure: POLYPECTOMY;  Surgeon: Corbin Ade, MD;  Location: [facility];  Service: Endoscopy;;   UPPER GASTROINTESTINAL ENDOSCOPY  AUG 2008 PAIN/NAUSEA   H. PYLORI treated    Prior to Admission medications   Medication Sig Start Date End Date Taking? Authorizing Provider  aspirin EC 81 MG tablet Take 81 mg by mouth daily.   Yes [provider]  blood glucose meter kit and supplies Dispense based on patient and insurance preference. Test once a day E11.9 07/04/15  Yes Merlyn Albert, MD  clonazePAM (KLONOPIN) 1 MG tablet Take 1 tablet (1 mg total) by mouth at bedtime. Patient taking differently: Take 1 mg by mouth as needed. 02/02/21  Yes Cook, [place] G, DO  CONTOUR TEST test strip TEST ONCE DAILY AS DIRECTED 12/02/20  Yes Campbell Riches, NP  enalapril (VASOTEC) 20 MG tablet Take 1 tablet (20 mg total) by mouth daily. 08/16/20  Yes Ladona Ridgel, Malena M, DO  escitalopram (LEXAPRO) 10 MG tablet Take 1 tablet (10 mg total) by mouth daily.  01/10/21 01/10/22 Yes Cook, Jayce G, DO  furosemide (LASIX) 20 MG tablet Take 0.5 tablets (10 mg total) by mouth daily. 01/10/21  Yes Cook, Jayce G, DO  levothyroxine (SYNTHROID) 50 MCG tablet Take 1 tablet (50 mcg total) by mouth daily. 09/09/20  Yes Lovena Le, Malena M, DO  Magnesium 250 MG TABS Take 250 mg by mouth as needed.   Yes [provider]  Oxcarbazepine (TRILEPTAL) 300 MG tablet Take 300 mg by mouth 2 (two) times daily.  10/06/18  Yes [provider]  pantoprazole (PROTONIX) 40 MG tablet Take 1 tablet (40 mg total) by mouth 2 (two) times daily before a meal. Patient  taking differently: Take 40 mg by mouth 2 (two) times daily before a meal. Takes sometimes. 12/11/18  Yes Aliene Altes S, PA-C  polyethylene glycol (MIRALAX / GLYCOLAX) 17 g packet Take 17 g by mouth daily as needed.   Yes [provider]  rosuvastatin (CRESTOR) 10 MG tablet Take 1 tablet (10 mg total) by mouth daily. 01/17/21  Yes Cook, Jayce G, DO  verapamil (CALAN-SR) 240 MG CR tablet Take 1 tablet (240 mg total) by mouth every morning. In the morning 01/10/21  Yes Cook, [place] G, DO    Allergies as of 02/03/2021 - Review Complete 02/03/2021  Allergen Reaction Noted   Augmentin [amoxicillin-pot clavulanate] Diarrhea 06/09/2012   Ipratropium Other (See Comments) 02/16/2019   Levaquin [levofloxacin in d5w] Other (See Comments) 07/14/2012   Rocephin [ceftriaxone sodium in dextrose] Hives and Other (See Comments) 06/09/2012   Sertraline Other (See Comments) 06/26/2019   Valtrex [valacyclovir hcl] Hives and Other (See Comments) 06/09/2012   Zoloft [sertraline hcl] Other (See Comments) 06/09/2012    Family History  Problem Relation Age of Onset   Coronary artery disease Mother    Hypertension Mother    Heart attack Mother    Heart disease Mother    Suicidality Mother    Coronary artery disease Brother        Age 35   Heart attack Brother    Heart disease Brother    Diabetes Sister    Diabetes Sister    Colon cancer Neg Hx    Anesthesia problems Neg Hx    Hypotension Neg Hx    Malignant hyperthermia Neg Hx    Pseudochol deficiency Neg Hx    Colon polyps Neg Hx     Social History   Socioeconomic History   Marital status: Married    Spouse name: Not on file   Number of children: 1   Years of education: Not on file   Highest education level: Not on file  Occupational History   Not on file  Tobacco Use   Smoking status: Never   Smokeless tobacco: Never   Tobacco comments:    Never Smoked  Vaping Use   Vaping Use: Never used  Substance and Sexual Activity    Alcohol use: Not Currently    Comment: 2 glasses of wine a week   Drug use: No   Sexual activity: Not Currently    Birth control/protection: Post-menopausal  Other Topics Concern   Not on file  Social History Narrative   Not on file   Social Determinants of Health   Financial Resource Strain: Low Risk    Difficulty of Paying Living Expenses: Not hard at all  Food Insecurity: No Food Insecurity   Worried About Estate manager/land agent of Food in the Last Year: Never true   Ran Out of Food in the Last  Year: Never true  Transportation Needs: No Transportation Needs   Lack of Transportation (Medical): No   Lack of Transportation (Non-Medical): No  Physical Activity: Inactive   Days of Exercise per Week: 0 days   Minutes of Exercise per Session: 0 min  Stress: Stress Concern Present   Feeling of Stress : Rather much  Social Connections: Unknown   Frequency of Communication with Friends and Family: Three times a week   Frequency of Social Gatherings with Friends and Family: Never   Attends Religious Services: Patient refused   Marine scientist or Organizations: Yes   Attends Archivist Meetings: Never   Marital Status: Married  Human resources officer Violence: Not At Risk   Fear of Current or Ex-Partner: No   Emotionally Abused: No   Physically Abused: No   Sexually Abused: No    Review of Systems: See HPI, otherwise negative ROS  Physical Exam: BP (!) 142/68   Pulse 83   Temp (!) 97.3 F (36.3 C) (Temporal)   Ht $R'5\' 5"'br$  (1.651 m)   Wt 199 lb 12.8 oz (90.6 kg)   BMI 33.25 kg/m  General:   Alert,  Well-developed, well-nourished, pleasant and cooperative in NAD Neck:  Supple; no masses or thyromegaly. No significant cervical adenopathy. Lungs:  Clear throughout to auscultation.   No wheezes, crackles, or rhonchi. No acute distress. Heart:  Regular rate and rhythm; no murmurs, clicks, rubs,  or gallops. Abdomen: Non-distended, normal bowel sounds.  Soft and nontender without  appreciable mass or hepatosplenomegaly.  Pulses:  Normal pulses noted. Extremities:  Without clubbing or edema. Rectal: No external lesions.  Digital exam nontender.  No masses appreciated scant brown stool in the rectal vault Hemoccult negative.   Impression/Plan: Pleasant 65 year old lady with a history of multiple adenomas/advanced adenomas removed from her colon over time.  Recent report of vague rectal pain and single episode of hematochezia in the setting of occasional constipation.  I suspect benign anorectal bleeding.  Hemoccult negative today.  She needs adjustment in her bowel regimen.  Recommendations:  Continue to use a seat cushion  Spend no more time in 2 to 5 minutes on the toilet attempting to have a bowel movement  Begin Colace 100 mg twice daily (over-the-counter)  Begin Benefiber 1 tablespoon daily for 3 weeks and increase to 2 tablespoons daily daily thereafter-can take at the same time as Colace  Please report any further bleeding  At this time, I think we can keep her on the schedule for repeat colonoscopy in 2025.  Continue pantoprazole 40 mg once daily for GERD  Office visit with [name] in 6 months      Continue to use a seat cushion  Spend no more time in 2 to 5 minutes on the toilet attempting to have a bowel movement  Begin Colace 100 mg twice daily (over-the-counter)  Begin Benefiber 1 tablespoon daily for 3 weeks and increase to 2 tablespoons daily daily thereafter-can take at the same time as Colace  Please report to me if you have any further bleeding  At this time, I think we can keep you on the schedule for repeat colonoscopy in 2025.  Continue pantoprazole 40 mg once daily for GERD  Office visit with [name] in 6 months Notice: This dictation was prepared with Dragon dictation along with smaller phrase technology. Any transcriptional errors that result from this process are unintentional and may not be corrected upon review.

## 2021-02-03 NOTE — Patient Instructions (Signed)
It was good seeing you again today!  There was no blood in your stool found today.  Your rectal exam was normal.  I suspect a little irritation from sitting on a hard seat in the setting of some constipation.  Recommend the following:  Continue to use a seat cushion  Spend no more time in 2 to 5 minutes on the toilet attempting to have a bowel movement  Begin Colace 100 mg twice daily (over-the-counter)  Begin Benefiber 1 tablespoon daily for 3 weeks and increase to 2 tablespoons daily daily thereafter-can take at the same time as Colace  Please report to me if you have any further bleeding  At this time, I think we can keep you on the schedule for repeat colonoscopy in 2025.  Continue pantoprazole 40 mg once daily for GERD  Office visit with [name] in 6 months

## 2021-02-05 ENCOUNTER — Ambulatory Visit
Admission: RE | Admit: 2021-02-05 | Discharge: 2021-02-05 | Disposition: A | Discharge status: Home / Self Care | Payer: BC Managed Care – PPO | Source: Ambulatory Visit | Attending: Neurology | Admitting: Neurology

## 2021-02-05 DIAGNOSIS — R519 Headache, unspecified: Secondary | ICD-10-CM | POA: Diagnosis not present

## 2021-02-05 DIAGNOSIS — M542 Cervicalgia: Secondary | ICD-10-CM | POA: Diagnosis not present

## 2021-02-05 IMAGING — MR MR HEAD WO/W CM
13 series · 48 of 48 positions shown · IV contrast (gadavist)
Comparison: [DATE]

CLINICAL DATA: Headache and upper neck pain

EXAM:
MRI HEAD WITHOUT AND WITH CONTRAST
TECHNIQUE: Multiplanar, multiecho pulse sequences of the brain and surrounding
structures were obtained without and with intravenous contrast.
CONTRAST:  10mL GADAVIST GADOBUTROL 1 MMOL/ML IV SOLN

[Series 5: ax dwi_tracew · axial · 3.0mm · 0.65mm/px · z∈[-95,+60]mm · 4 of 48 slices shown]
[im 1/48]
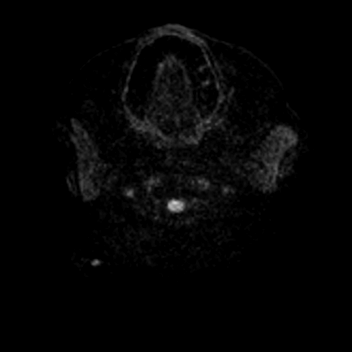
[im 16/48]
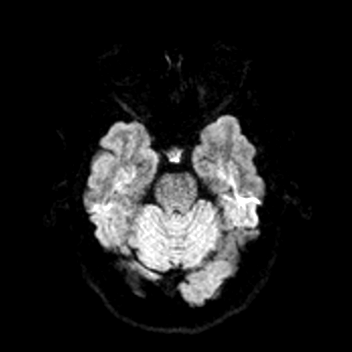
[im 32/48]
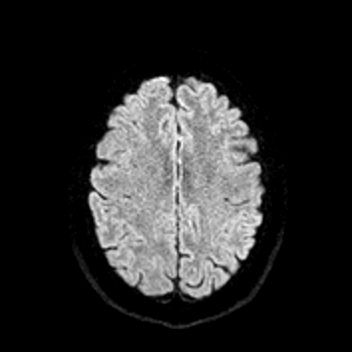
[im 48/48]
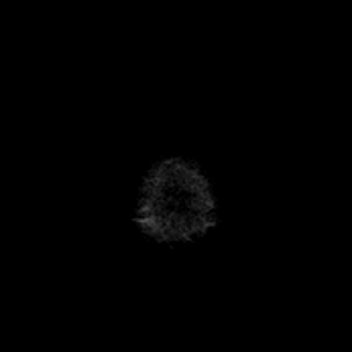

[Series 6: ax dwi_adc · axial · 3.0mm · 0.65mm/px · z∈[-95,+60]mm · 3 of 47 slices shown]
[im 1/47]
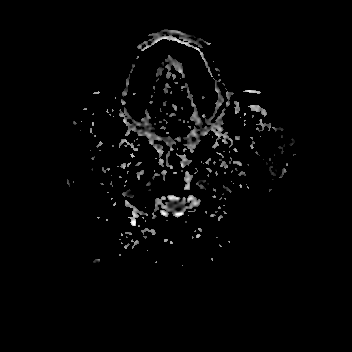
[im 24/47]
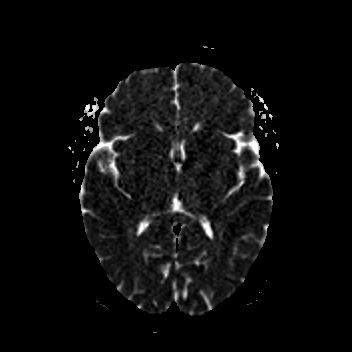
[im 47/47]
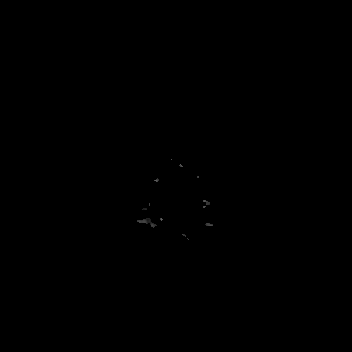

[Series 7: cor dwi_tracew · coronal · 5.0mm · 0.60mm/px · 2 of 38 slices shown]
[im 1/38]
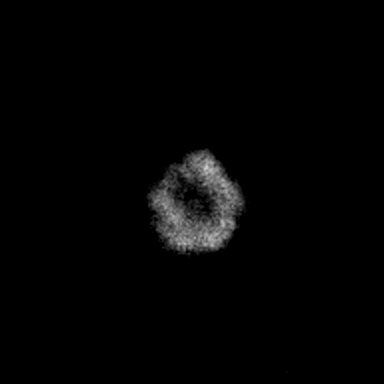
[im 38/38]
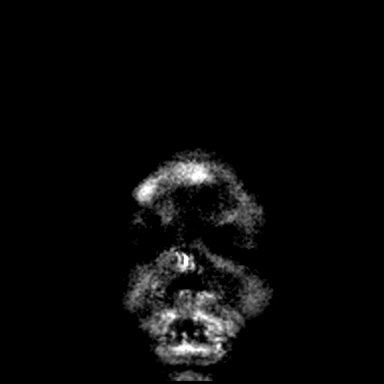

[Series 8: cor dwi_adc · coronal · 5.0mm · 0.60mm/px · 2 of 38 slices shown]
[im 1/38]
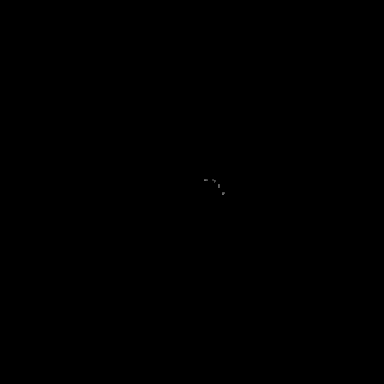
[im 38/38]
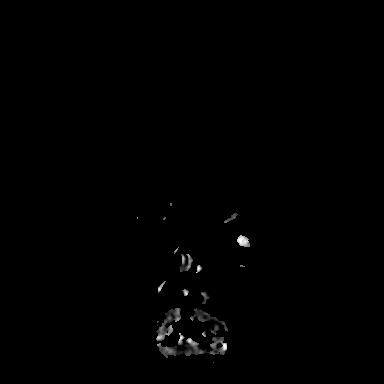

[Series 10: T1 · sagittal · 5.0mm · 0.62mm/px · 1 of 21 slices shown (1 of 2)]
[im 1/21]
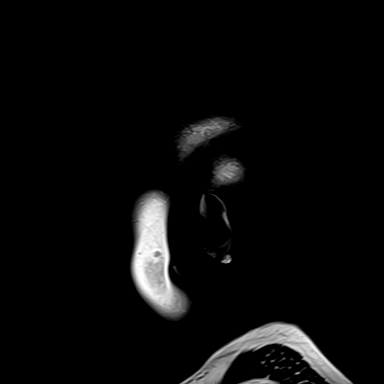

[Series 11: T2 · axial · 5.0mm · 0.53mm/px · z∈[-101,+55]mm · 2 of 27 slices shown]
[im 1/27]
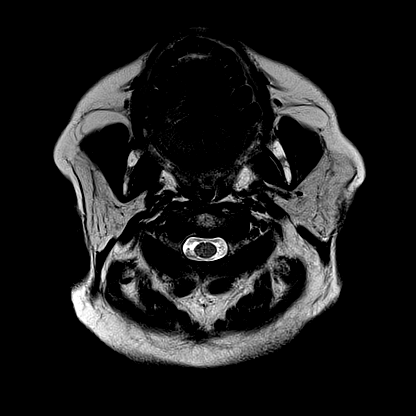
[im 27/27]
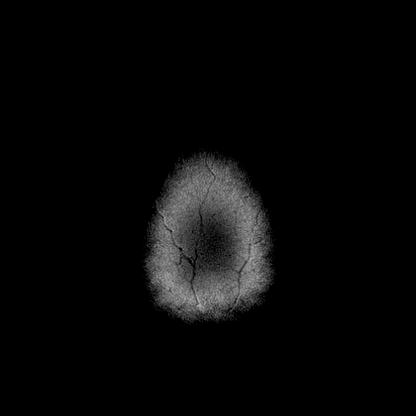

[Series 13: pha_images · axial · 3.0mm · 0.90mm/px · z∈[-103,+59]mm · 3 of 55 slices shown]
[im 1/55]
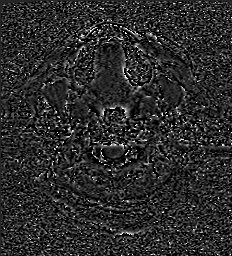
[im 28/55]
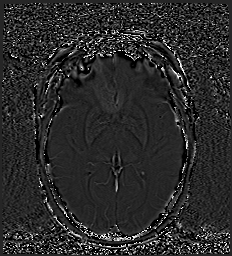
[im 55/55]
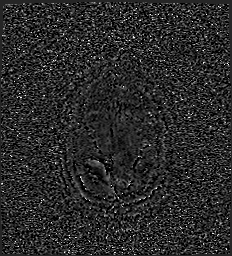

[Series 14: swi_images · axial · 3.0mm · 0.90mm/px · z∈[-106,+59]mm · 4 of 56 slices shown]
[im 1/56]
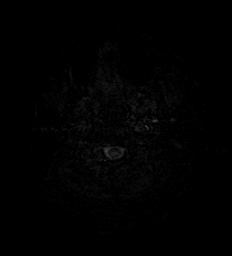
[im 19/56]
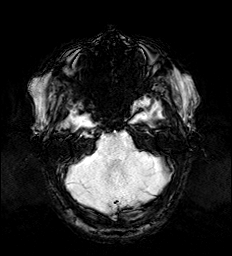
[im 37/56]
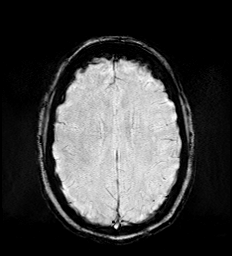
[im 56/56]
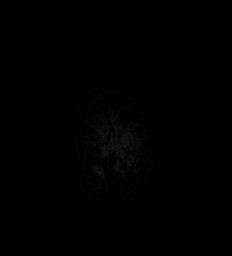

[Series 16: FLAIR · axial · 3.0mm · 0.53mm/px · z∈[-104,+58]mm · 3 of 55 slices shown]
[im 1/55]
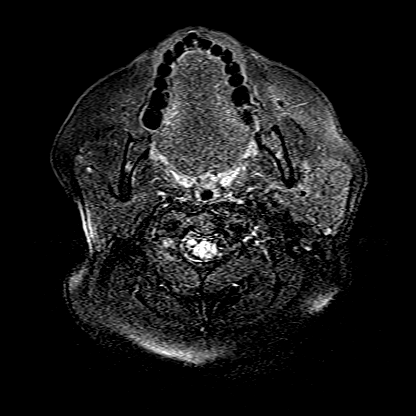
[im 28/55]
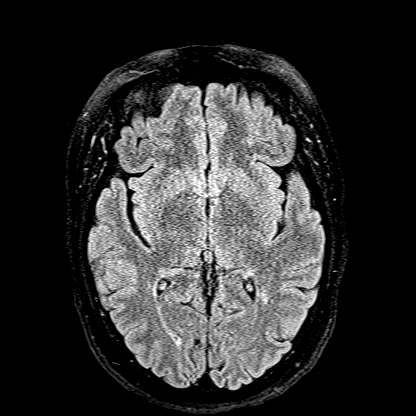
[im 55/55]
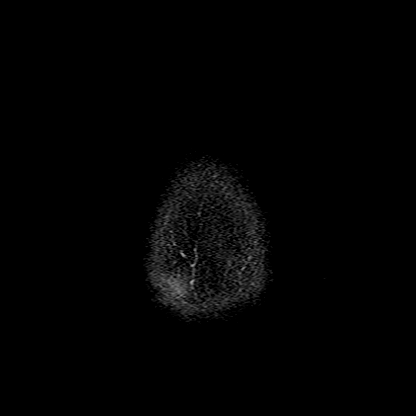

[Series 17: T1 · axial · 1.0mm · 0.98mm/px · z∈[-102,+57]mm · 10 of 160 slices shown (2 of 2)]
[im 1/160]
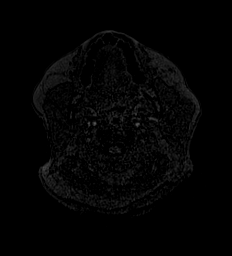
[im 18/160]
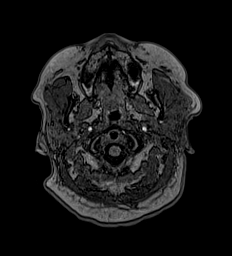
[im 36/160]
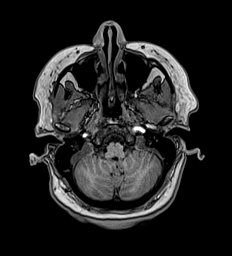
[im 54/160]
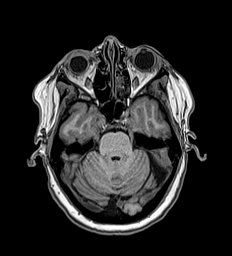
[im 71/160]
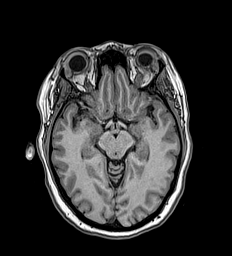
[im 89/160]
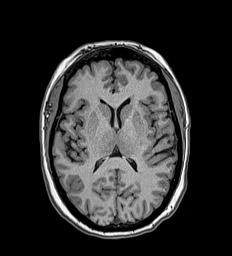
[im 107/160]
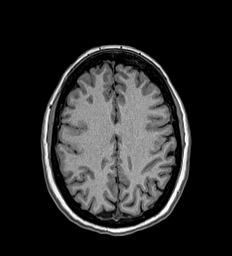
[im 124/160]
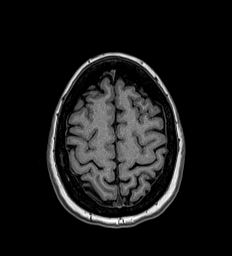
[im 142/160]
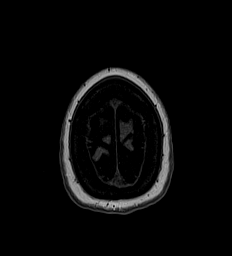
[im 160/160]
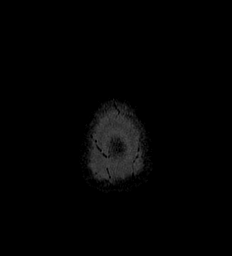

[Series 18: T2 post-contrast · coronal · 5.0mm · 0.57mm/px · 2 of 29 slices shown]
[im 1/29]
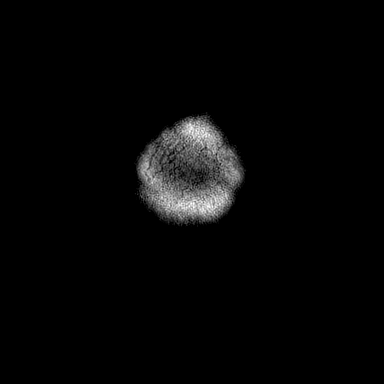
[im 29/29]
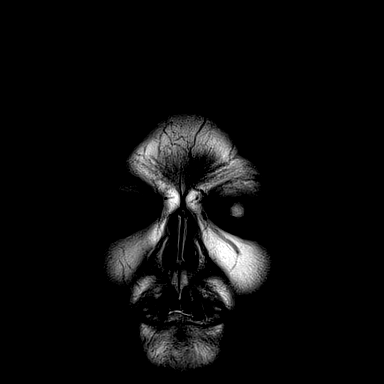

[Series 19: T1 post-contrast · axial · 1.0mm · 0.98mm/px · z∈[-102,+57]mm · 10 of 160 slices shown (1 of 2)]
[im 1/160]
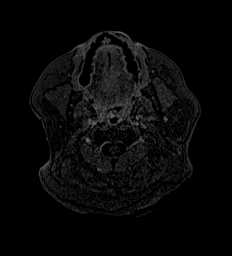
[im 18/160]
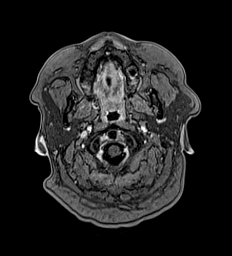
[im 36/160]
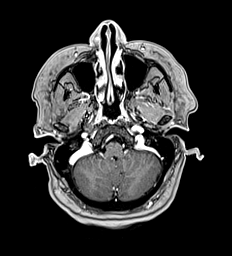
[im 54/160]
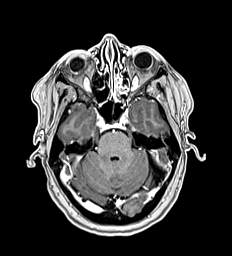
[im 71/160]
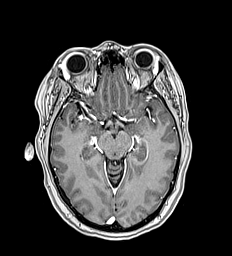
[im 89/160]
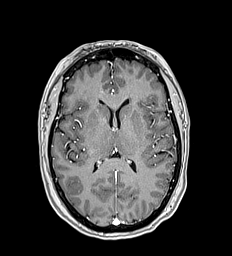
[im 107/160]
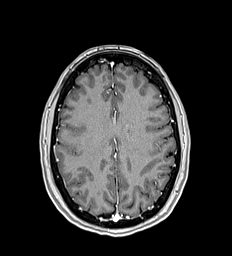
[im 124/160]
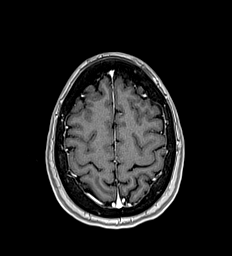
[im 142/160]
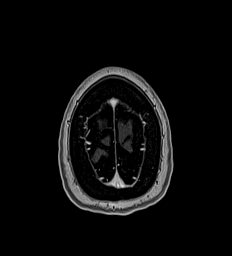
[im 160/160]
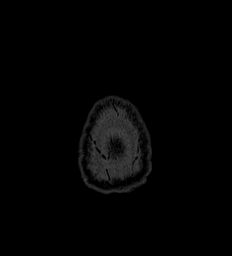

[Series 20: T1 post-contrast · coronal · 5.0mm · 0.57mm/px · 2 of 29 slices shown (2 of 2)]
[im 1/29]
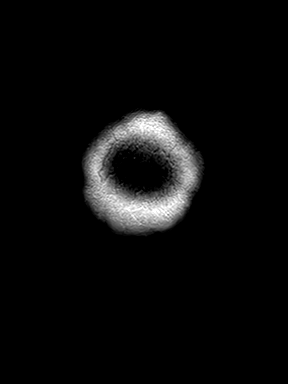
[im 29/29]
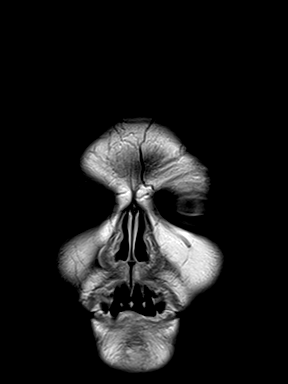

[48 of 48 positions shown; findings below may reference images not displayed]

FINDINGS: Brain: No acute infarct, mass effect or extra-axial collection. No
acute or chronic hemorrhage. Minimal multifocal hyperintense
T2-weight signal within the white matter. The midline structures are
normal. There is no abnormal contrast enhancement.

Vascular: Major flow voids are preserved.

Skull and upper cervical spine: Normal calvarium and skull base.
Visualized upper cervical spine and soft tissues are normal.

Sinuses/Orbits:No paranasal sinus fluid levels or advanced mucosal
thickening. No mastoid or middle ear effusion. Normal orbits.
IMPRESSION: Normal MRI of the brain for age.

## 2021-02-05 MED ORDER — GADOBUTROL 1 MMOL/ML IV SOLN
9.0000 mL | Freq: Once | INTRAVENOUS | Status: AC | PRN
  Administered 2021-02-05: 17:00:00 10 mL via INTRAVENOUS

## 2021-02-14 DIAGNOSIS — R42 Dizziness and giddiness: Secondary | ICD-10-CM | POA: Diagnosis not present

## 2021-02-14 DIAGNOSIS — G4733 Obstructive sleep apnea (adult) (pediatric): Secondary | ICD-10-CM | POA: Diagnosis not present

## 2021-02-14 DIAGNOSIS — M5481 Occipital neuralgia: Secondary | ICD-10-CM | POA: Diagnosis not present

## 2021-02-14 DIAGNOSIS — G501 Atypical facial pain: Secondary | ICD-10-CM | POA: Diagnosis not present

## 2021-02-28 ENCOUNTER — Other Ambulatory Visit: Payer: Self-pay | Admitting: Physical Medicine & Rehabilitation

## 2021-02-28 DIAGNOSIS — M5412 Radiculopathy, cervical region: Secondary | ICD-10-CM | POA: Diagnosis not present

## 2021-02-28 DIAGNOSIS — M542 Cervicalgia: Secondary | ICD-10-CM

## 2021-03-10 DIAGNOSIS — F339 Major depressive disorder, recurrent, unspecified: Secondary | ICD-10-CM | POA: Diagnosis not present

## 2021-03-10 DIAGNOSIS — Z780 Asymptomatic menopausal state: Secondary | ICD-10-CM | POA: Diagnosis not present

## 2021-03-10 DIAGNOSIS — Z0000 Encounter for general adult medical examination without abnormal findings: Secondary | ICD-10-CM | POA: Diagnosis not present

## 2021-03-10 DIAGNOSIS — E039 Hypothyroidism, unspecified: Secondary | ICD-10-CM | POA: Diagnosis not present

## 2021-03-13 DIAGNOSIS — E782 Mixed hyperlipidemia: Secondary | ICD-10-CM | POA: Diagnosis not present

## 2021-03-13 DIAGNOSIS — I10 Essential (primary) hypertension: Secondary | ICD-10-CM | POA: Diagnosis not present

## 2021-03-13 DIAGNOSIS — R079 Chest pain, unspecified: Secondary | ICD-10-CM | POA: Diagnosis not present

## 2021-03-13 DIAGNOSIS — R42 Dizziness and giddiness: Secondary | ICD-10-CM | POA: Diagnosis not present

## 2021-03-14 ENCOUNTER — Other Ambulatory Visit: Payer: Self-pay

## 2021-03-14 ENCOUNTER — Ambulatory Visit
Admission: RE | Admit: 2021-03-14 | Discharge: 2021-03-14 | Disposition: A | Discharge status: Home / Self Care | Payer: BC Managed Care – PPO | Source: Ambulatory Visit | Attending: Physical Medicine & Rehabilitation | Admitting: Physical Medicine & Rehabilitation

## 2021-03-14 DIAGNOSIS — M2578 Osteophyte, vertebrae: Secondary | ICD-10-CM | POA: Diagnosis not present

## 2021-03-14 DIAGNOSIS — M542 Cervicalgia: Secondary | ICD-10-CM | POA: Diagnosis not present

## 2021-03-14 DIAGNOSIS — M47812 Spondylosis without myelopathy or radiculopathy, cervical region: Secondary | ICD-10-CM | POA: Diagnosis not present

## 2021-03-14 DIAGNOSIS — R202 Paresthesia of skin: Secondary | ICD-10-CM | POA: Diagnosis not present

## 2021-03-14 IMAGING — MR MR CERVICAL SPINE W/O CM
5 series · 35 of 48 positions shown · non-contrast
Comparison: Brain MRI [DATE].

CLINICAL DATA: Provided history: Cervicalgia. Additional history
provided: Patient reports neck injury 1 year ago, neck pain,
bilateral hand tingling.

EXAM:
MRI CERVICAL SPINE WITHOUT CONTRAST
TECHNIQUE: Multiplanar, multisequence MR imaging of the cervical spine was
performed. No intravenous contrast was administered.

[Series 5: T2 · sagittal · 3.0mm · 0.62mm/px · 6 of 15 slices shown (1 of 2)]
[im 1/15]
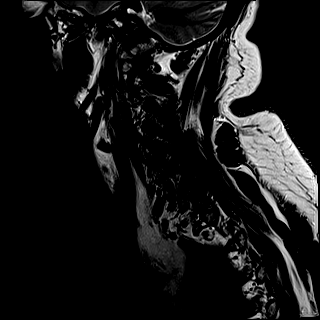
[im 3/15]
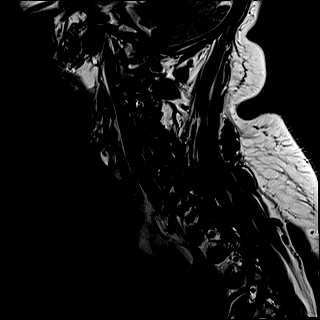
[im 6/15]
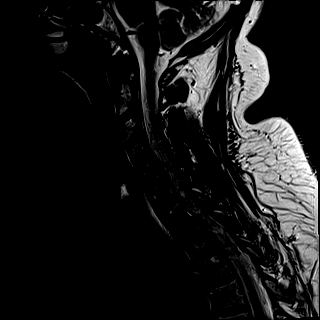
[im 9/15]
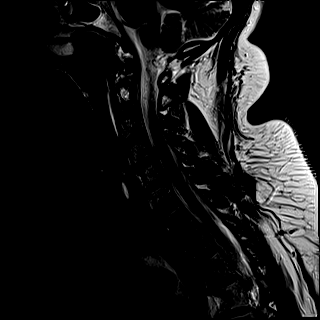
[im 12/15]
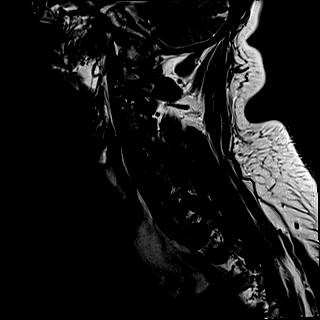
[im 15/15]
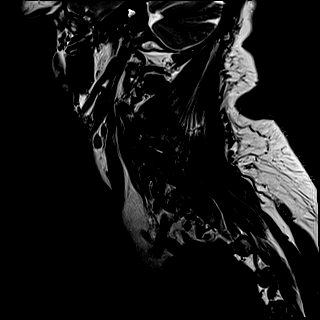

[Series 6: FLAIR · sagittal · 3.0mm · 0.78mm/px · 7 of 15 slices shown]
[im 1/15]
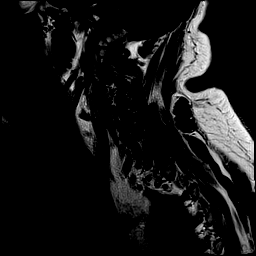
[im 3/15]
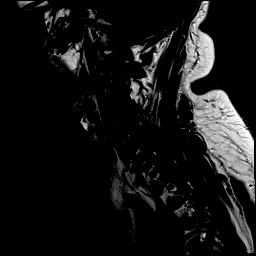
[im 5/15]
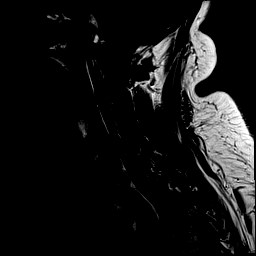
[im 8/15]
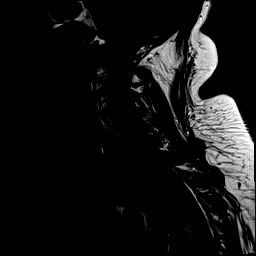
[im 10/15]
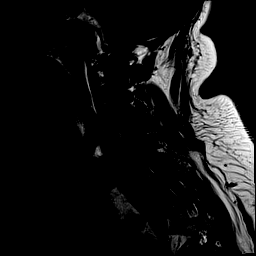
[im 12/15]
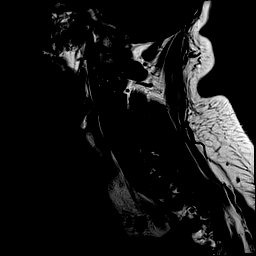
[im 15/15]
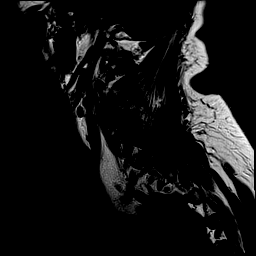

[Series 7: STIR · sagittal · 3.0mm · 0.62mm/px · 7 of 15 slices shown]
[im 1/15]
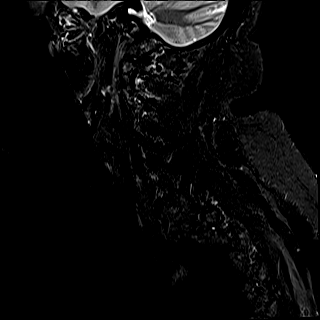
[im 3/15]
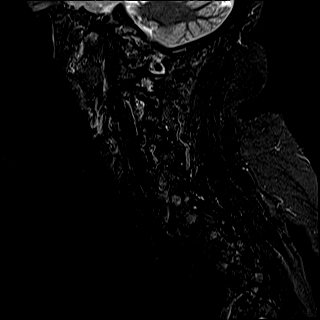
[im 5/15]
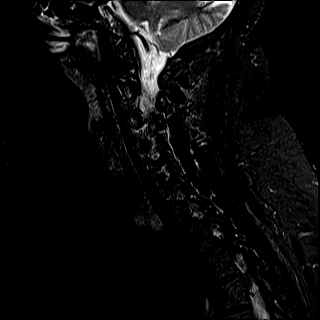
[im 8/15]
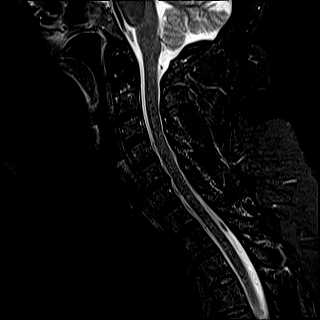
[im 10/15]
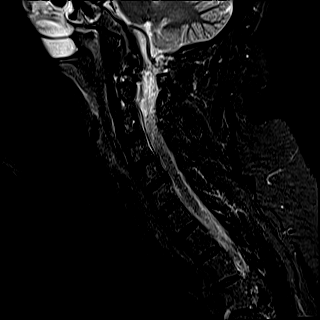
[im 12/15]
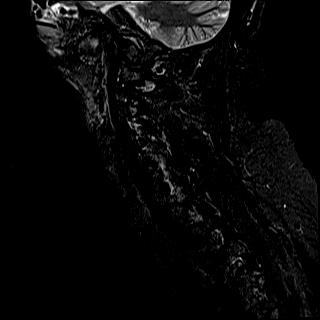
[im 15/15]
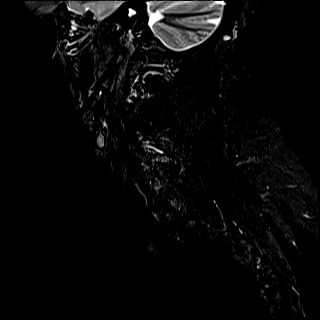

[Series 8: T2 · axial · 3.0mm · 0.70mm/px · z∈[-147,-56]mm · 8 of 29 slices shown (2 of 2)]
[im 1/29]
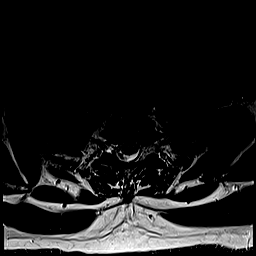
[im 5/29]
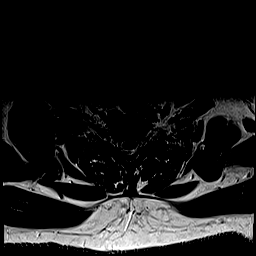
[im 9/29]
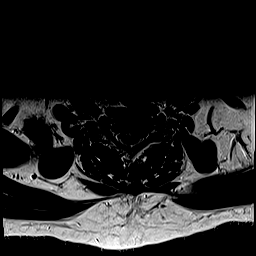
[im 13/29]
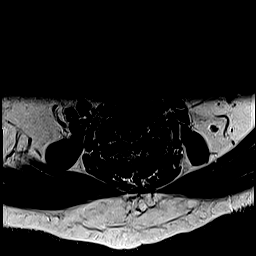
[im 16/29]
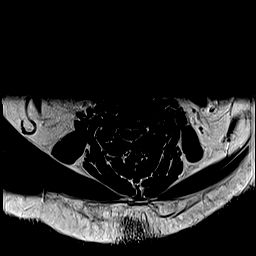
[im 20/29]
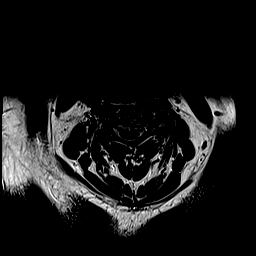
[im 24/29]
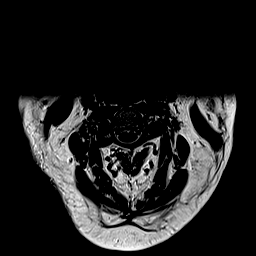
[im 29/29]
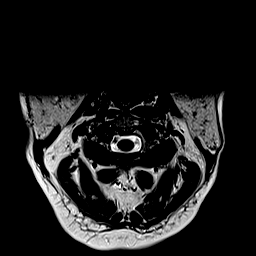

[Series 9: ax mpgr · axial · 3.0mm · 0.35mm/px · z∈[-147,-72]mm · 7 of 29 slices shown]
[im 1/29]
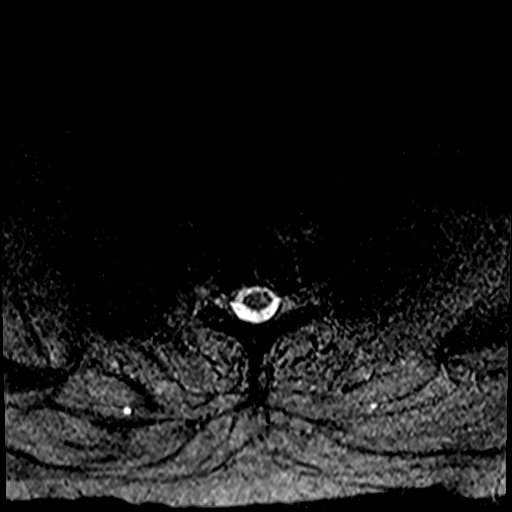
[im 5/29]
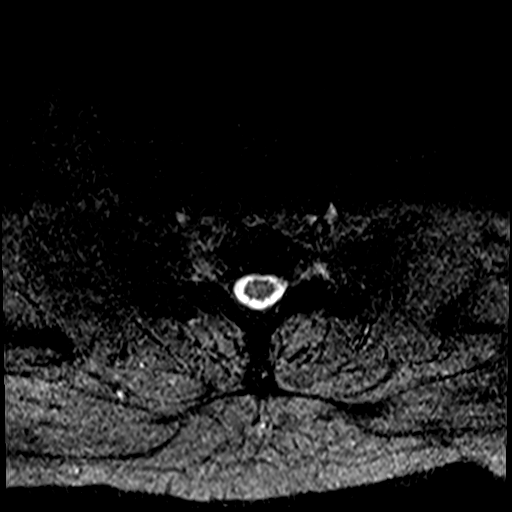
[im 9/29]
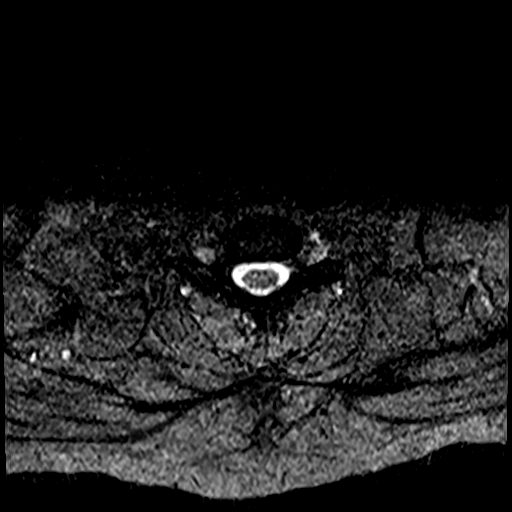
[im 13/29]
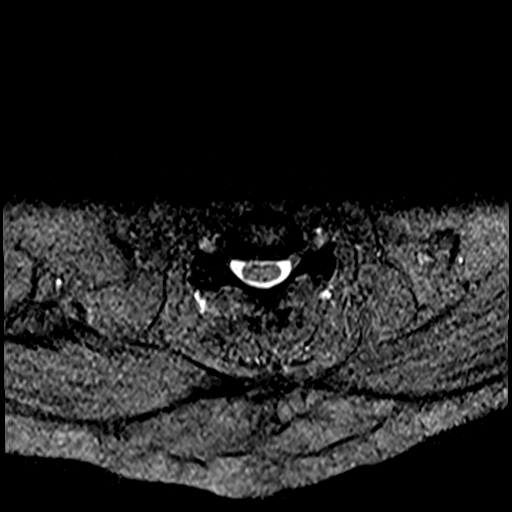
[im 16/29]
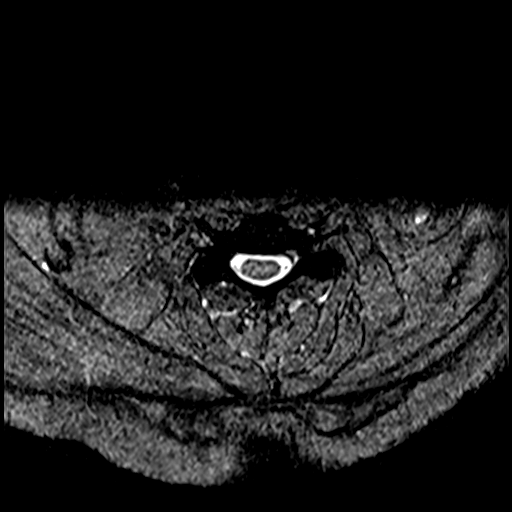
[im 20/29]
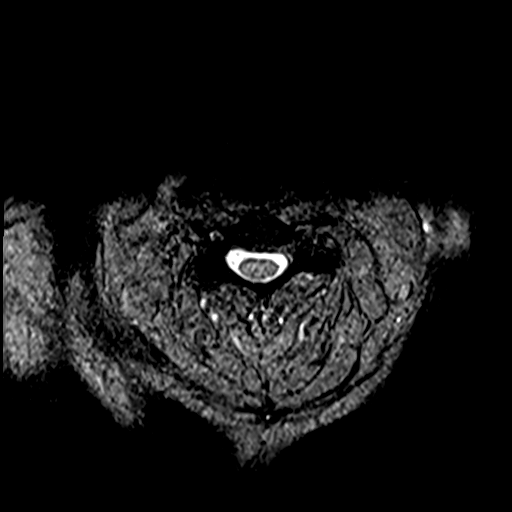
[im 24/29]
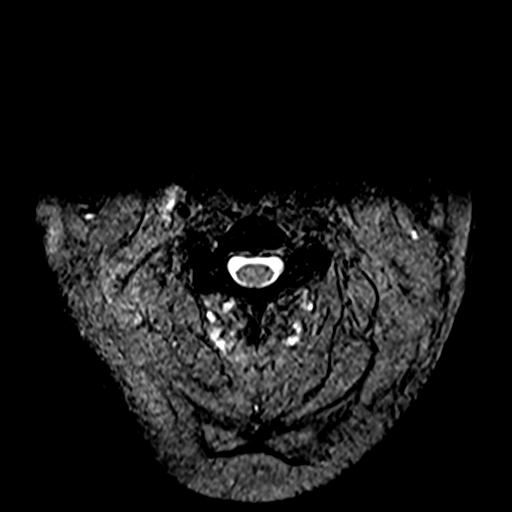

[35 of 48 positions shown; findings below may reference images not displayed]

cervical spine radiographs
[DATE]. Report from MRI of the cervical spine [DATE] (images
unavailable).
FINDINGS: Alignment: Slight cervical levocurvature. Straightening of the
expected cervical lordosis. Trace C4-C5 grade 1 anterolisthesis.
Trace C5-C6 grade 1 retrolisthesis.

Vertebrae: Vertebral body height is maintained. Edema within the
left C3 through C6 articular pillars, likely degenerative and
related to facet arthrosis. Elsewhere, no significant marrow edema
or focal suspicious osseous lesion is identified.

Cord: No signal abnormality identified within the cervical spinal
cord.

Posterior Fossa, vertebral arteries, paraspinal tissues: No
abnormality identified within included portions of the posterior
fossa. Flow voids preserved within the imaged cervical vertebral
arteries. Paraspinal soft tissues unremarkable.

Disc levels:

Mild multilevel disc degeneration, greatest at C5-C6.

C2-C3: No significant disc herniation or spinal canal stenosis. Mild
uncovertebral hypertrophy on the right. Mild facet arthrosis. No
significant spinal canal or foraminal stenosis.

C3-C4: No significant disc herniation or spinal canal stenosis.
Facet arthrosis. Mild relative right neural foraminal narrowing.

C4-C5: Trace grade 1 anterolisthesis. Slight disc uncovering and
disc bulge. Uncovertebral hypertrophy on the left. Facet arthrosis
(greater on the left and fairly advanced on the left). Small left
facet joint effusion. No significant spinal canal stenosis. Moderate
left neural foraminal narrowing.

C5-C6: Trace grade 1 retrolisthesis. Disc bulge. Right-sided disc
osteophyte ridge/uncinate hypertrophy. Minimal uncovertebral
hypertrophy also present on the left. Facet arthrosis. The disc
bulge contributes to mild spinal canal narrowing and contacts the
ventral spinal cord. Moderate right neural foraminal narrowing.

C6-C7: Facet arthrosis (predominantly on the left). No significant
disc herniation or stenosis.

C7-T1: Facet arthrosis. No significant disc herniation or stenosis.
IMPRESSION: Cervical spondylosis, as outlined and with findings most notably as
follows.

At C5-C6, there is trace grade 1 retrolisthesis. Disc bulge.
Right-sided disc osteophyte ridge/uncinate hypertrophy. Minimal
uncovertebral hypertrophy also present on the left. Facet arthrosis.
The disc bulge contributes to mild spinal canal narrowing, and
contacts the ventral spinal cord. Moderate right neural foraminal
narrowing.

At C4-C5, there is trace grade 1 anterolisthesis. Slight disc
uncovering and disc bulge. Uncovertebral hypertrophy on the left.
Facet arthrosis (greater on the left and fairly advanced on the
left). Small left facet joint effusion. Resultant moderate left
neural foraminal narrowing. No significant spinal canal stenosis.

No significant spinal canal stenosis, and no more than mild neural
foraminal narrowing, at the remaining levels.

Marrow edema within the left C3-C6 articular pillars, likely
degenerative and related to facet arthrosis.

## 2021-03-28 DIAGNOSIS — R9431 Abnormal electrocardiogram [ECG] [EKG]: Secondary | ICD-10-CM | POA: Diagnosis not present

## 2021-03-28 DIAGNOSIS — R42 Dizziness and giddiness: Secondary | ICD-10-CM | POA: Diagnosis not present

## 2021-03-28 DIAGNOSIS — R0602 Shortness of breath: Secondary | ICD-10-CM | POA: Diagnosis not present

## 2021-04-04 DIAGNOSIS — R9431 Abnormal electrocardiogram [ECG] [EKG]: Secondary | ICD-10-CM | POA: Diagnosis not present

## 2021-04-04 DIAGNOSIS — M542 Cervicalgia: Secondary | ICD-10-CM | POA: Diagnosis not present

## 2021-04-04 DIAGNOSIS — I10 Essential (primary) hypertension: Secondary | ICD-10-CM | POA: Diagnosis not present

## 2021-04-04 DIAGNOSIS — E782 Mixed hyperlipidemia: Secondary | ICD-10-CM | POA: Diagnosis not present

## 2021-04-04 DIAGNOSIS — E119 Type 2 diabetes mellitus without complications: Secondary | ICD-10-CM | POA: Diagnosis not present

## 2021-04-04 DIAGNOSIS — M5412 Radiculopathy, cervical region: Secondary | ICD-10-CM | POA: Diagnosis not present

## 2021-04-04 DIAGNOSIS — M9931 Osseous stenosis of neural canal of cervical region: Secondary | ICD-10-CM | POA: Diagnosis not present

## 2021-05-03 DIAGNOSIS — G44209 Tension-type headache, unspecified, not intractable: Secondary | ICD-10-CM | POA: Diagnosis not present

## 2021-05-03 DIAGNOSIS — J060 Acute laryngopharyngitis: Secondary | ICD-10-CM | POA: Diagnosis not present

## 2021-05-03 DIAGNOSIS — E039 Hypothyroidism, unspecified: Secondary | ICD-10-CM | POA: Diagnosis not present

## 2021-05-15 DIAGNOSIS — M852 Hyperostosis of skull: Secondary | ICD-10-CM | POA: Diagnosis not present

## 2021-05-15 DIAGNOSIS — R519 Headache, unspecified: Secondary | ICD-10-CM | POA: Diagnosis not present

## 2021-05-15 DIAGNOSIS — M5481 Occipital neuralgia: Secondary | ICD-10-CM | POA: Diagnosis not present

## 2021-05-15 DIAGNOSIS — R200 Anesthesia of skin: Secondary | ICD-10-CM | POA: Diagnosis not present

## 2021-07-03 DIAGNOSIS — Z780 Asymptomatic menopausal state: Secondary | ICD-10-CM | POA: Diagnosis not present

## 2021-07-03 DIAGNOSIS — E559 Vitamin D deficiency, unspecified: Secondary | ICD-10-CM | POA: Diagnosis not present

## 2021-07-03 DIAGNOSIS — E782 Mixed hyperlipidemia: Secondary | ICD-10-CM | POA: Diagnosis not present

## 2021-07-03 DIAGNOSIS — E039 Hypothyroidism, unspecified: Secondary | ICD-10-CM | POA: Diagnosis not present

## 2021-07-03 DIAGNOSIS — E119 Type 2 diabetes mellitus without complications: Secondary | ICD-10-CM | POA: Diagnosis not present

## 2021-07-03 DIAGNOSIS — Z0000 Encounter for general adult medical examination without abnormal findings: Secondary | ICD-10-CM | POA: Diagnosis not present

## 2021-07-05 ENCOUNTER — Encounter: Payer: Self-pay | Admitting: Internal Medicine

## 2021-07-10 ENCOUNTER — Ambulatory Visit: Payer: BC Managed Care – PPO | Admitting: Family Medicine

## 2021-07-10 DIAGNOSIS — R200 Anesthesia of skin: Secondary | ICD-10-CM | POA: Diagnosis not present

## 2021-07-10 DIAGNOSIS — E039 Hypothyroidism, unspecified: Secondary | ICD-10-CM | POA: Diagnosis not present

## 2021-07-10 DIAGNOSIS — E119 Type 2 diabetes mellitus without complications: Secondary | ICD-10-CM | POA: Diagnosis not present

## 2021-08-02 DIAGNOSIS — R9431 Abnormal electrocardiogram [ECG] [EKG]: Secondary | ICD-10-CM | POA: Diagnosis not present

## 2021-08-02 DIAGNOSIS — E119 Type 2 diabetes mellitus without complications: Secondary | ICD-10-CM | POA: Diagnosis not present

## 2021-08-02 DIAGNOSIS — I878 Other specified disorders of veins: Secondary | ICD-10-CM | POA: Diagnosis not present

## 2021-08-02 DIAGNOSIS — I10 Essential (primary) hypertension: Secondary | ICD-10-CM | POA: Diagnosis not present

## 2021-08-13 ENCOUNTER — Other Ambulatory Visit: Payer: Self-pay | Admitting: Nurse Practitioner

## 2021-08-25 ENCOUNTER — Other Ambulatory Visit (HOSPITAL_COMMUNITY): Payer: Self-pay | Admitting: Obstetrics & Gynecology

## 2021-08-25 DIAGNOSIS — Z1231 Encounter for screening mammogram for malignant neoplasm of breast: Secondary | ICD-10-CM

## 2021-09-01 ENCOUNTER — Ambulatory Visit (HOSPITAL_COMMUNITY)
Admission: RE | Admit: 2021-09-01 | Discharge: 2021-09-01 | Disposition: A | Discharge status: Home / Self Care | Payer: BC Managed Care – PPO | Source: Ambulatory Visit | Attending: Obstetrics & Gynecology | Admitting: Obstetrics & Gynecology

## 2021-09-01 DIAGNOSIS — Z1231 Encounter for screening mammogram for malignant neoplasm of breast: Secondary | ICD-10-CM | POA: Insufficient documentation

## 2021-09-07 DIAGNOSIS — E119 Type 2 diabetes mellitus without complications: Secondary | ICD-10-CM | POA: Diagnosis not present

## 2021-09-07 DIAGNOSIS — E039 Hypothyroidism, unspecified: Secondary | ICD-10-CM | POA: Diagnosis not present

## 2021-09-07 DIAGNOSIS — E213 Hyperparathyroidism, unspecified: Secondary | ICD-10-CM | POA: Diagnosis not present

## 2021-09-07 DIAGNOSIS — E559 Vitamin D deficiency, unspecified: Secondary | ICD-10-CM | POA: Diagnosis not present

## 2021-09-19 DIAGNOSIS — E119 Type 2 diabetes mellitus without complications: Secondary | ICD-10-CM | POA: Diagnosis not present

## 2021-10-04 DIAGNOSIS — R200 Anesthesia of skin: Secondary | ICD-10-CM | POA: Diagnosis not present

## 2021-10-04 DIAGNOSIS — E119 Type 2 diabetes mellitus without complications: Secondary | ICD-10-CM | POA: Diagnosis not present

## 2021-10-10 DIAGNOSIS — F411 Generalized anxiety disorder: Secondary | ICD-10-CM | POA: Diagnosis not present

## 2021-10-10 DIAGNOSIS — E039 Hypothyroidism, unspecified: Secondary | ICD-10-CM | POA: Diagnosis not present

## 2021-10-10 DIAGNOSIS — E119 Type 2 diabetes mellitus without complications: Secondary | ICD-10-CM | POA: Diagnosis not present

## 2021-10-13 ENCOUNTER — Other Ambulatory Visit: Payer: Self-pay | Admitting: Family Medicine

## 2021-10-13 DIAGNOSIS — F419 Anxiety disorder, unspecified: Secondary | ICD-10-CM

## 2021-10-13 DIAGNOSIS — F331 Major depressive disorder, recurrent, moderate: Secondary | ICD-10-CM

## 2021-12-12 ENCOUNTER — Telehealth (HOSPITAL_COMMUNITY): Payer: Self-pay

## 2021-12-12 NOTE — Telephone Encounter (Signed)
Pt called back she thought the referral was sent to the [place] location advised her that this is the [place] location and we are only doing virtual appt with Dr Nehemiah Settle, pt states that she will reach out to her doctors office and have them send the referral to [place]

## 2021-12-12 NOTE — Telephone Encounter (Signed)
Called pt 3 times to schedule appt for med management no answer left vm

## 2022-01-17 DIAGNOSIS — E119 Type 2 diabetes mellitus without complications: Secondary | ICD-10-CM | POA: Diagnosis not present

## 2022-01-17 DIAGNOSIS — E559 Vitamin D deficiency, unspecified: Secondary | ICD-10-CM | POA: Diagnosis not present

## 2022-01-17 DIAGNOSIS — E039 Hypothyroidism, unspecified: Secondary | ICD-10-CM | POA: Diagnosis not present

## 2022-01-17 DIAGNOSIS — E213 Hyperparathyroidism, unspecified: Secondary | ICD-10-CM | POA: Diagnosis not present

## 2022-01-22 ENCOUNTER — Other Ambulatory Visit: Payer: Self-pay | Admitting: Family Medicine

## 2022-01-22 DIAGNOSIS — I10 Essential (primary) hypertension: Secondary | ICD-10-CM

## 2022-01-31 DIAGNOSIS — E782 Mixed hyperlipidemia: Secondary | ICD-10-CM | POA: Diagnosis not present

## 2022-01-31 DIAGNOSIS — E119 Type 2 diabetes mellitus without complications: Secondary | ICD-10-CM | POA: Diagnosis not present

## 2022-01-31 DIAGNOSIS — I10 Essential (primary) hypertension: Secondary | ICD-10-CM | POA: Diagnosis not present

## 2022-01-31 DIAGNOSIS — R079 Chest pain, unspecified: Secondary | ICD-10-CM | POA: Diagnosis not present

## 2022-02-02 ENCOUNTER — Ambulatory Visit
Admission: EM | Admit: 2022-02-02 | Discharge: 2022-02-02 | Disposition: A | Discharge status: Home / Self Care | Payer: BC Managed Care – PPO | Attending: Family Medicine | Admitting: Family Medicine

## 2022-02-02 DIAGNOSIS — J069 Acute upper respiratory infection, unspecified: Secondary | ICD-10-CM | POA: Insufficient documentation

## 2022-02-02 DIAGNOSIS — R300 Dysuria: Secondary | ICD-10-CM | POA: Diagnosis not present

## 2022-02-02 DIAGNOSIS — Z1152 Encounter for screening for COVID-19: Secondary | ICD-10-CM | POA: Insufficient documentation

## 2022-02-02 DIAGNOSIS — R058 Other specified cough: Secondary | ICD-10-CM | POA: Diagnosis not present

## 2022-02-02 LAB — POCT URINALYSIS DIP (MANUAL ENTRY)
Bilirubin, UA: NEGATIVE
Blood, UA: NEGATIVE
Glucose, UA: NEGATIVE mg/dL
Ketones, POC UA: NEGATIVE mg/dL
Leukocytes, UA: NEGATIVE
Nitrite, UA: NEGATIVE
Protein Ur, POC: NEGATIVE mg/dL
Spec Grav, UA: 1.02 (ref 1.010–1.025)
Urobilinogen, UA: 1 E.U./dL
pH, UA: 7.5 (ref 5.0–8.0)

## 2022-02-02 LAB — RESP PANEL BY RT-PCR (FLU A&B, COVID) ARPGX2
Influenza A by PCR: NEGATIVE
Influenza B by PCR: NEGATIVE
SARS Coronavirus 2 by RT PCR: NEGATIVE

## 2022-02-02 MED ORDER — PROMETHAZINE-DM 6.25-15 MG/5ML PO SYRP: 5.0000 mL | ORAL_SOLUTION | Freq: Four times a day (QID) | ORAL | 0 refills | Status: DC | PRN

## 2022-02-02 MED ORDER — AZELASTINE HCL 0.1 % NA SOLN: 1.0000 | Freq: Two times a day (BID) | NASAL | 0 refills | Status: DC

## 2022-02-02 NOTE — ED Triage Notes (Signed)
Pt reports she got sick Monday and she had a headache. She is coughing and produces white flem. She took tylenol which provided some releif. She is also having some low back pain and frequent urination which started Tuesday.

## 2022-02-04 NOTE — ED Provider Notes (Signed)
[facility]    CSN: [id number] Arrival date & time: 02/02/22  1036      History   Chief Complaint No chief complaint on file.   HPI Kaitlin [name] is a 66 y.o. female.   Presenting today with productive cough, headache, fatigue, malaise for the past 4 days.  Denies fever, body aches, chest pain, shortness of breath, abdominal pain, nausea vomiting or diarrhea.  Now also having some lower back aching, urinary frequency the past few days as well.  So far trying Tylenol with minimal relief.    Past Medical History:  Diagnosis Date   Anxiety    Colon polyp    April 2008   Depression    Essential hypertension    Folliculitis 1/85/6314   Helicobacter pylori gastritis    AUG 2008 PREVPAC: nausea-->HELIDAC: GI UPSET   Hypothyroidism    Irritable bowel syndrome    Kidney stones    Mixed hyperlipidemia    Mixed stress and urge urinary incontinence 09/26/2012   Obesity    Panic attacks    Reflux    Tubulovillous adenoma 08/07/10   Colonoscopy w/ Dr Oneida Alar w/ 7 polyps removed-bx showed tubular adenomas.  Largest 1cm-hepatic flexure polyp->TA.      Patient Active Problem List   Diagnosis Date Noted   Hypercalcemia 01/10/2021   Anxiety 01/10/2021   Musculoskeletal pain 01/10/2021   Plantar fasciitis 01/10/2021   Mixed hyperlipidemia 09/25/2020   Major depression, recurrent, chronic (HCC) 09/30/2019   Osteoarthritis of knee 04/07/2018   Loss of weight 05/27/2017   PTSD (post-traumatic stress disorder) 04/09/2017   MDD (major depressive disorder), recurrent episode, moderate ([place]) 04/09/2017   Diabetes mellitus type 2, diet-controlled ([place]) 12/20/2016   Chondromalacia of left knee 12/11/2016   GERD (gastroesophageal reflux disease) 02/20/2016   Hiatal hernia    Mixed stress and urge urinary incontinence 09/26/2012   Subserous leiomyoma of uterus 09/01/2012   Hypothyroidism 06/12/2012   Venous stasis 06/12/2012   Essential hypertension, benign 12/11/2010    IBS (irritable bowel syndrome) 07/05/2010   Hx of adenomatous colonic polyps 07/05/2010    Past Surgical History:  Procedure Laterality Date   CHOLECYSTECTOMY     COLONOSCOPY  APR 2008   AC/[place] SIMPLE ADENOMA   COLONOSCOPY  08/07/2010   Dr. Raynald Kemp adenomas and tubulovillous adenoma; needs surveillance 2015   COLONOSCOPY N/A 09/18/2013   Dr.Rourk- normal rectum, 2 diminutive polys in the mid sigmoid segment o/w the remainder of the colonic mucosa appeared normal.hyperplastic poylps   COLONOSCOPY WITH PROPOFOL N/A 02/23/2019   Procedure: COLONOSCOPY WITH PROPOFOL;  Surgeon: Daneil Dolin, MD;  [facility]: [facility];  Service: Endoscopy;  Laterality: N/A;  2:45pm - office unable to reach pt to move her up   ESOPHAGOGASTRODUODENOSCOPY N/A 11/29/2014   RMR: Gastric erosions. Small hiatal hernia. Status post biopsy.    MASS EXCISION  02/28/2011   Procedure: EXCISION MASS;  Surgeon: Donato Heinz, MD;  Location: [facility];  Service: General;  Laterality: Right;  soft tissue mass   POLYPECTOMY  02/23/2019   Procedure: POLYPECTOMY;  Surgeon: Daneil Dolin, MD;  Location: [facility];  Service: Endoscopy;;   UPPER GASTROINTESTINAL ENDOSCOPY  AUG 2008 PAIN/NAUSEA   H. PYLORI treated    OB History     Gravida  1   Para  1   Term      Preterm      AB      Living  1  SAB      IAB      Ectopic      Multiple      Live Births  1            Home Medications    Prior to Admission medications   Medication Sig Start Date End Date Taking? Authorizing Provider  azelastine (ASTELIN) 0.1 % nasal spray Place 1 spray into both nostrils 2 (two) times daily. Use in each nostril as directed 02/02/22  Yes Volney American, PA-C  promethazine-dextromethorphan (PROMETHAZINE-DM) 6.25-15 MG/5ML syrup Take 5 mLs by mouth 4 (four) times daily as needed. 02/02/22  Yes Volney American, PA-C  aspirin EC 81 MG tablet Take 81 mg by mouth daily.    [provider]  blood glucose meter kit and supplies Dispense based on patient and insurance preference. Test once a day E11.9 07/04/15   [name], MD  clonazePAM (KLONOPIN) 1 MG tablet Take 1 tablet (1 mg total) by mouth as needed. 10/13/21   Coral Spikes, DO  enalapril (VASOTEC) 20 MG tablet Take 1 tablet (20 mg total) by mouth daily. 08/16/20   Lovena Le, Malena M, DO  escitalopram (LEXAPRO) 10 MG tablet Take 1 tablet (10 mg total) by mouth daily. 01/10/21 01/10/22  Coral Spikes, DO  furosemide (LASIX) 20 MG tablet Take 0.5 tablets (10 mg total) by mouth daily. 01/10/21   Thersa Salt G, DO  glucose blood (CONTOUR NEXT TEST) test strip TEST ONCE DAILY AS DIRECTED 08/14/21   Coral Spikes, DO  levothyroxine (SYNTHROID) 50 MCG tablet Take 1 tablet (50 mcg total) by mouth daily. 09/09/20   Elvia Collum M, DO  Magnesium 250 MG TABS Take 250 mg by mouth as needed.    [provider]  Oxcarbazepine (TRILEPTAL) 300 MG tablet Take 300 mg by mouth 2 (two) times daily.  10/06/18   [provider]  pantoprazole (PROTONIX) 40 MG tablet Take 1 tablet (40 mg total) by mouth 2 (two) times daily before a meal. Patient taking differently: Take 40 mg by mouth 2 (two) times daily before a meal. Takes sometimes. 12/11/18   Erenest Rasher, PA-C  polyethylene glycol (MIRALAX / GLYCOLAX) 17 g packet Take 17 g by mouth daily as needed.    [provider]  rosuvastatin (CRESTOR) 10 MG tablet Take 1 tablet (10 mg total) by mouth daily. 01/17/21   Coral Spikes, DO  verapamil (CALAN-SR) 240 MG CR tablet TAKE 1 TABLET (240 MG TOTAL) BY MOUTH EVERY MORNING. IN THE MORNING 01/22/22   Coral Spikes, DO    Family History Family History  Problem Relation Age of Onset   Coronary artery disease Mother    Hypertension Mother    Heart attack Mother    Heart disease Mother    Suicidality Mother    Coronary artery disease Brother        Age 80   Heart attack Brother    Heart disease Brother    Diabetes Sister     Diabetes Sister    Colon cancer Neg Hx    Anesthesia problems Neg Hx    Hypotension Neg Hx    Malignant hyperthermia Neg Hx    Pseudochol deficiency Neg Hx    Colon polyps Neg Hx     Social History Social History   Tobacco Use   Smoking status: Never   Smokeless tobacco: Never   Tobacco comments:    Never Smoked  Vaping Use  Vaping Use: Never used  Substance Use Topics   Alcohol use: Not Currently    Comment: 2 glasses of wine a week   Drug use: No   Allergies   Augmentin [amoxicillin-pot clavulanate], Ipratropium, Levaquin [levofloxacin in d5w], Rocephin [ceftriaxone sodium in dextrose], Sertraline, Valtrex [valacyclovir hcl], and Zoloft [sertraline hcl]  Review of Systems Review of Systems PER HPI  Physical Exam Triage Vital Signs ED Triage Vitals  Enc Vitals Group     BP 02/02/22 1054 (!) 147/83     Pulse Rate 02/02/22 1054 78     Resp 02/02/22 1054 20     Temp 02/02/22 1054 98 F (36.7 C)     Temp Source 02/02/22 1054 Oral     SpO2 02/02/22 1054 96 %     Weight --      Height --      Head Circumference --      Peak Flow --      Pain Score 02/02/22 1057 0     Pain Loc --      Pain Edu? --      Excl. in [place]? --    No data found.  Updated Vital Signs BP (!) 147/83 (BP Location: Right Arm)   Pulse 78   Temp 98 F (36.7 C) (Oral)   Resp 20   SpO2 96%   Visual Acuity Right Eye Distance:   Left Eye Distance:   Bilateral Distance:    Right Eye Near:   Left Eye Near:    Bilateral Near:     Physical Exam Vitals and nursing note reviewed.  Constitutional:      Appearance: Normal appearance.  HENT:     Head: Atraumatic.     Right Ear: Tympanic membrane and external ear normal.     Left Ear: Tympanic membrane and external ear normal.     Nose: Rhinorrhea present.     Mouth/Throat:     Mouth: Mucous membranes are moist.     Pharynx: Posterior oropharyngeal erythema present.  Eyes:     Extraocular Movements: Extraocular movements intact.      Conjunctiva/sclera: Conjunctivae normal.  Cardiovascular:     Rate and Rhythm: Normal rate and regular rhythm.     Heart sounds: Normal heart sounds.  Pulmonary:     Effort: Pulmonary effort is normal.     Breath sounds: Normal breath sounds. No wheezing or rales.  Abdominal:     General: Bowel sounds are normal. There is no distension.     Palpations: Abdomen is soft.     Tenderness: There is no abdominal tenderness. There is no right CVA tenderness, left CVA tenderness or guarding.  Musculoskeletal:        General: Normal range of motion.     Cervical back: Normal range of motion and neck supple.  Skin:    General: Skin is warm and dry.  Neurological:     Mental Status: She is alert and oriented to person, place, and time.  Psychiatric:        Mood and Affect: Mood normal.        Thought Content: Thought content normal.      UC Treatments / Results  Labs (all labs ordered are listed, but only abnormal results are displayed) Labs Reviewed  RESP PANEL BY RT-PCR (FLU A&B, COVID) [id number]  POCT URINALYSIS DIP (MANUAL ENTRY)    EKG   Radiology No results found.  Procedures Procedures (including critical care time)  Medications Ordered in UC  Medications - No data to display  Initial Impression / Assessment and Plan / UC Course  I have reviewed the triage vital signs and the nursing notes.  Pertinent labs & imaging results that were available during my care of the patient were reviewed by me and considered in my medical decision making (see chart for details).     Vital signs benign and reassuring today, urinalysis without evidence of urinary tract infection.  Suspect viral upper respiratory infection, respiratory panel pending, treat symptoms with Phenergan DM, Astelin and good hydration for the dysuria.  Return for worsening symptoms.  Final Clinical Impressions(s) / UC Diagnoses   Final diagnoses:  Viral URI with cough  Dysuria   Discharge Instructions    None    ED Prescriptions     Medication Sig Dispense Auth. Provider   promethazine-dextromethorphan (PROMETHAZINE-DM) 6.25-15 MG/5ML syrup Take 5 mLs by mouth 4 (four) times daily as needed. 100 mL Volney American, PA-C   azelastine (ASTELIN) 0.1 % nasal spray Place 1 spray into both nostrils 2 (two) times daily. Use in each nostril as directed 30 mL Volney American, PA-C      PDMP not reviewed this encounter.   Volney American, [place] 02/04/22 1009

## 2022-02-05 ENCOUNTER — Other Ambulatory Visit: Payer: Self-pay | Admitting: Family Medicine

## 2022-03-16 ENCOUNTER — Other Ambulatory Visit: Payer: Self-pay | Admitting: Family Medicine

## 2022-04-08 ENCOUNTER — Encounter (HOSPITAL_COMMUNITY): Payer: Self-pay

## 2022-04-08 ENCOUNTER — Emergency Department (HOSPITAL_COMMUNITY): Payer: Self-pay

## 2022-04-08 ENCOUNTER — Other Ambulatory Visit: Payer: Self-pay

## 2022-04-08 ENCOUNTER — Emergency Department (HOSPITAL_COMMUNITY)
Admission: EM | Admit: 2022-04-08 | Discharge: 2022-04-09 | Disposition: A | Discharge status: Home / Self Care | Payer: Self-pay | Attending: Student | Admitting: Student

## 2022-04-08 DIAGNOSIS — I10 Essential (primary) hypertension: Secondary | ICD-10-CM

## 2022-04-08 DIAGNOSIS — Y92512 Supermarket, store or market as the place of occurrence of the external cause: Secondary | ICD-10-CM | POA: Insufficient documentation

## 2022-04-08 DIAGNOSIS — S8002XA Contusion of left knee, initial encounter: Secondary | ICD-10-CM | POA: Insufficient documentation

## 2022-04-08 DIAGNOSIS — M25511 Pain in right shoulder: Secondary | ICD-10-CM | POA: Insufficient documentation

## 2022-04-08 DIAGNOSIS — W19XXXA Unspecified fall, initial encounter: Secondary | ICD-10-CM

## 2022-04-08 DIAGNOSIS — W010XXA Fall on same level from slipping, tripping and stumbling without subsequent striking against object, initial encounter: Secondary | ICD-10-CM | POA: Insufficient documentation

## 2022-04-08 DIAGNOSIS — M25551 Pain in right hip: Secondary | ICD-10-CM | POA: Insufficient documentation

## 2022-04-08 DIAGNOSIS — Z7982 Long term (current) use of aspirin: Secondary | ICD-10-CM | POA: Insufficient documentation

## 2022-04-08 MED ORDER — LIDOCAINE 5 % EX PTCH
1.0000 | MEDICATED_PATCH | Freq: Once | CUTANEOUS | Status: DC
  Administered 2022-04-08: 1 via TRANSDERMAL
  Filled 2022-04-08: qty 1

## 2022-04-08 MED ORDER — ACETAMINOPHEN 325 MG PO TABS
650.0000 mg | ORAL_TABLET | Freq: Once | ORAL | Status: AC
  Administered 2022-04-08: 650 mg via ORAL
  Filled 2022-04-08: qty 2

## 2022-04-08 NOTE — ED Provider Notes (Signed)
[facility] Provider Note   CSN: [id number] Arrival date & time: 04/08/22  2019     History  Chief Complaint  Patient presents with   Lytle Michaels    ASTON LAWHORN is a 67 y.o. female who presents emergency department with concerns for fall onset 5 days. She notes that she slipped on wet floor at a store. Denies being on anticoagulants at this time. Notes that she hit her head.  Denies LOC, chest pain, shortness of breath, nausea, vomiting, back pain, headache.  The history is provided by the patient. No language interpreter was used.       Home Medications Prior to Admission medications   Medication Sig Start Date End Date Taking? Authorizing Provider  aspirin EC 81 MG tablet Take 81 mg by mouth daily.    [provider]  azelastine (ASTELIN) 0.1 % nasal spray Place 1 spray into both nostrils 2 (two) times daily. Use in each nostril as directed 02/02/22   Volney American, PA-C  blood glucose meter kit and supplies Dispense based on patient and insurance preference. Test once a day E11.9 07/04/15   [name], MD  clonazePAM (KLONOPIN) 1 MG tablet Take 1 tablet (1 mg total) by mouth as needed. 10/13/21   Coral Spikes, DO  enalapril (VASOTEC) 20 MG tablet Take 1 tablet (20 mg total) by mouth daily. 08/16/20   Lovena Le, Malena M, DO  escitalopram (LEXAPRO) 10 MG tablet Take 1 tablet (10 mg total) by mouth daily. 01/10/21 01/10/22  Coral Spikes, DO  furosemide (LASIX) 20 MG tablet Take 0.5 tablets (10 mg total) by mouth daily. 01/10/21   Thersa Salt G, DO  glucose blood (CONTOUR NEXT TEST) test strip TEST ONCE DAILY AS DIRECTED 08/14/21   Coral Spikes, DO  levothyroxine (SYNTHROID) 50 MCG tablet Take 1 tablet (50 mcg total) by mouth daily. 09/09/20   Elvia Collum M, DO  Magnesium 250 MG TABS Take 250 mg by mouth as needed.    [provider]  Oxcarbazepine (TRILEPTAL) 300 MG tablet Take 300 mg by mouth 2 (two) times daily.   10/06/18   [provider]  pantoprazole (PROTONIX) 40 MG tablet Take 1 tablet (40 mg total) by mouth 2 (two) times daily before a meal. Patient taking differently: Take 40 mg by mouth 2 (two) times daily before a meal. Takes sometimes. 12/11/18   Erenest Rasher, PA-C  polyethylene glycol (MIRALAX / GLYCOLAX) 17 g packet Take 17 g by mouth daily as needed.    [provider]  promethazine-dextromethorphan (PROMETHAZINE-DM) 6.25-15 MG/5ML syrup Take 5 mLs by mouth 4 (four) times daily as needed. 02/02/22   Volney American, PA-C  rosuvastatin (CRESTOR) 10 MG tablet Take 1 tablet (10 mg total) by mouth daily. 01/17/21   Coral Spikes, DO  verapamil (CALAN-SR) 240 MG CR tablet TAKE 1 TABLET (240 MG TOTAL) BY MOUTH EVERY MORNING. IN THE MORNING 01/22/22   Coral Spikes, DO      Allergies    Augmentin [amoxicillin-pot clavulanate], Ipratropium, Levaquin [levofloxacin in d5w], Rocephin [ceftriaxone sodium in dextrose], Sertraline, Valtrex [valacyclovir hcl], and Zoloft [sertraline hcl]    Review of Systems   Review of Systems  All other systems reviewed and are negative.   Physical Exam Updated Vital Signs BP (!) 186/77 (BP Location: Right Arm)   Pulse 83   Temp 98.1 F (36.7 C) (Oral)   Resp 16   Ht  $'5\' 5"'g$  (1.651 m)   Wt 85.6 kg   SpO2 98%   BMI 31.40 kg/m  Physical Exam Vitals and nursing note reviewed.  Constitutional:      General: She is not in acute distress.    Appearance: Normal appearance.  Eyes:     General: No scleral icterus.    Extraocular Movements: Extraocular movements intact.  Cardiovascular:     Rate and Rhythm: Normal rate and regular rhythm.     Pulses: Normal pulses.     Heart sounds: Normal heart sounds.  Pulmonary:     Effort: Pulmonary effort is normal. No respiratory distress.     Breath sounds: Normal breath sounds.  Abdominal:     Palpations: Abdomen is soft. There is no mass.     Tenderness: There is no abdominal tenderness.   Musculoskeletal:        General: Normal range of motion.     Cervical back: Neck supple.     Comments: No spinal tenderness to palpation.  Tenderness to palpation noted to right upper trapezius.  Mild tenderness to palpation noted to right anterior hip with normal flexion and extension against resistance.  Ecchymosis noted to left anterior knee without significant tenderness to palpation.  Normal flexion and extension against resistance.  Skin:    General: Skin is warm and dry.     Findings: No rash.  Neurological:     Mental Status: She is alert.     Sensory: Sensation is intact.     Motor: Motor function is intact.  Psychiatric:        Behavior: Behavior normal.     ED Results / Procedures / Treatments   Labs (all labs ordered are listed, but only abnormal results are displayed) Labs Reviewed - No data to display  EKG None  Radiology DG Knee Complete 4 Views Left  Result Date: 04/08/2022 CLINICAL DATA:  Fall EXAM: LEFT KNEE - COMPLETE 4+ VIEW COMPARISON:  08/17/2016 FINDINGS: No evidence of fracture, dislocation, or joint effusion. No evidence of arthropathy or other focal bone abnormality. Soft tissues are unremarkable. IMPRESSION: Negative. Electronically Signed   By: Rolm Baptise M.D.   On: 04/08/2022 21:17   DG Cervical Spine 2-3 Views  Result Date: 04/08/2022 CLINICAL DATA:  Fall EXAM: CERVICAL SPINE - 2-3 VIEW COMPARISON:  10/24/2020 FINDINGS: Degenerative facet disease diffusely. Normal alignment. Prevertebral soft tissues are normal. No fracture or subluxation. IMPRESSION: No acute bony abnormality. Diffuse degenerative facet disease. Electronically Signed   By: Rolm Baptise M.D.   On: 04/08/2022 21:16   DG Hip Unilat W or Wo Pelvis 2-3 Views Right  Result Date: 04/08/2022 CLINICAL DATA:  Fall EXAM: DG HIP (WITH OR WITHOUT PELVIS) 2-3V RIGHT COMPARISON:  02/08/2020 FINDINGS: There is no evidence of hip fracture or dislocation. There is no evidence of arthropathy or other  focal bone abnormality. IMPRESSION: Negative. Electronically Signed   By: Rolm Baptise M.D.   On: 04/08/2022 21:15    Procedures Procedures    Medications Ordered in ED Medications  lidocaine (LIDODERM) 5 % 1 patch (1 patch Transdermal Patch Applied 04/08/22 2257)  acetaminophen (TYLENOL) tablet 650 mg (650 mg Oral Given 04/08/22 2258)    ED Course/ Medical Decision Making/ A&P Clinical Course as of 04/08/22 2306  Sun Apr 08, 2022  2224 Pt declines CT head at this time. Offered tylenol or ibuprofen to patient. Pt declines at this time. Pt notes that she would like to go home and take her tylenol  at home. Patient inquired if she can be discharged home with medication for her anxiety. Pt is driving at this time, discussed with patient that we cannot proceed with medications at this time.  [SB]  2230 Pt agreeable to CT scans at this time. Scans ordered [SB]    Clinical Course User Index [SB] Quyen Cutsforth A, PA-C                             Medical Decision Making Amount and/or Complexity of Data Reviewed Radiology: ordered.  Risk OTC drugs. Prescription drug management.   Pt with fall occurring 5 days ago. Denies hitting their head, LOC, headache, vision changes. Vital signs pt afebrile. On exam, patient with No spinal tenderness to palpation.  Tenderness to palpation noted to right upper trapezius.  Mild tenderness to palpation noted to right anterior hip with normal flexion and extension against resistance.  Ecchymosis noted to left anterior knee without significant tenderness to palpation.  Normal flexion and extension against resistance. No acute cardiovascular, respiratory, or abdominal exam findings. Differential diagnosis includes fracture, dislocation, contusion.    Imaging: I ordered imaging studies including left knee, right hip, cervical x-ray I independently visualized and interpreted imaging which showed: No acute findings on x-ray studies. CT head and cervical spine  ordered with results pending at time of signout I agree with the radiologist interpretation  Medications:  I ordered medication including Tylenol, warm compress, Lidoderm patch for symptom management I have reviewed the patients home medicines and have made adjustments as needed  Patient case discussed with Dr. Dayna Barker, at sign-out. Plan at sign-out is CT scans, likely Discharge home, however, plans may change as per oncoming team. Patient care transferred at sign out.    This chart was dictated using voice recognition software, Dragon. Despite the best efforts of this provider to proofread and correct errors, errors may still occur which can change documentation meaning.   Final Clinical Impression(s) / ED Diagnoses Final diagnoses:  Fall, initial encounter    Rx / DC Orders ED Discharge Orders     None         Lillien Petronio A, PA-C 04/08/22 2307    Teressa Lower, MD 04/09/22 1249

## 2022-04-08 NOTE — ED Triage Notes (Signed)
Pt reports she fell last Tuesday at a store because the floor was wet,  c/o neck pain, left knee and right hip are still sore.

## 2022-04-08 NOTE — Discharge Instructions (Addendum)
Your imaging studies did not show any emergent findings today.  You will be sent a prescription for Lidoderm patch, take as directed.  You may take over-the-counter 500 mg Tylenol every 6 hours and alternate with 600 mg ibuprofen every 6 hours as needed for pain.  You may place ice or heat to the affected areas for up to 15 minutes at a time, sure to place a barrier between your skin and ice/heat.  You may follow-up with your primary care provider regarding today's ED visit.  Return to the emergency department if you experience increasing/worsening symptoms.

## 2022-04-09 NOTE — ED Provider Notes (Signed)
12:07 AM Assumed care from [facility] and Dr. Matilde Sprang, please see their note for full history, physical and decision making until this point. In brief this is a 67 y.o. year old female who presented to the ED tonight with Fall     Pendign ct scans for disposition. Low suspicion for injury.   Scans interpreted by myself withotu obvious blood or fracture, radiology read reveiwed as well.   Will fu w/ pcp for BP management.   Discharge instructions, including strict return precautions for new or worsening symptoms, given. Patient and/or family verbalized understanding and agreement with the plan as described.   Labs, studies and imaging reviewed by myself and considered in medical decision making if ordered. Imaging interpreted by radiology.  Labs Reviewed - No data to display  DG Hip Unilat W or Wo Pelvis 2-3 Views Right  Final Result    DG Knee Complete 4 Views Left  Final Result    DG Cervical Spine 2-3 Views  Final Result    CT Head Wo Contrast    (Results Pending)  CT Cervical Spine Wo Contrast    (Results Pending)    No follow-ups on file.    Omarie Parcell, Corene Cornea, MD 04/09/22 [phone]
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
## 2022-04-26 ENCOUNTER — Ambulatory Visit: Payer: BC Managed Care – PPO | Admitting: Psychiatry

## 2022-04-26 ENCOUNTER — Encounter: Payer: Self-pay | Admitting: Psychiatry

## 2022-04-26 VITALS — BP 127/74 | HR 85 | Temp 98.1°F | Ht 65.0 in | Wt 191.0 lb

## 2022-04-26 DIAGNOSIS — F4310 Post-traumatic stress disorder, unspecified: Secondary | ICD-10-CM

## 2022-04-26 DIAGNOSIS — F411 Generalized anxiety disorder: Secondary | ICD-10-CM | POA: Diagnosis not present

## 2022-04-26 DIAGNOSIS — F1320 Sedative, hypnotic or anxiolytic dependence, uncomplicated: Secondary | ICD-10-CM | POA: Diagnosis not present

## 2022-04-26 DIAGNOSIS — F331 Major depressive disorder, recurrent, moderate: Secondary | ICD-10-CM

## 2022-04-26 NOTE — Patient Instructions (Signed)
Please make sure to contact your primary care provider to continue your medication, and consider another referral to see a psychiatrist.

## 2022-05-03 ENCOUNTER — Ambulatory Visit: Payer: Self-pay | Admitting: Adult Health

## 2022-05-08 ENCOUNTER — Other Ambulatory Visit: Payer: Self-pay | Admitting: Internal Medicine

## 2022-05-08 ENCOUNTER — Ambulatory Visit
Admission: RE | Admit: 2022-05-08 | Discharge: 2022-05-08 | Disposition: A | Discharge status: Home / Self Care | Payer: BC Managed Care – PPO | Source: Ambulatory Visit | Attending: Internal Medicine | Admitting: Internal Medicine

## 2022-05-08 DIAGNOSIS — R051 Acute cough: Secondary | ICD-10-CM

## 2022-07-03 ENCOUNTER — Encounter: Payer: Self-pay | Admitting: Internal Medicine

## 2022-07-03 ENCOUNTER — Ambulatory Visit: Payer: BC Managed Care – PPO | Admitting: Internal Medicine

## 2022-07-03 VITALS — BP 128/76 | HR 64 | Temp 97.2°F | Ht 65.0 in | Wt 192.6 lb

## 2022-07-03 DIAGNOSIS — K219 Gastro-esophageal reflux disease without esophagitis: Secondary | ICD-10-CM

## 2022-07-03 DIAGNOSIS — Z86010 Personal history of colonic polyps: Secondary | ICD-10-CM | POA: Diagnosis not present

## 2022-07-03 NOTE — Progress Notes (Unsigned)
Primary Care Physician:  Enid Baas, MD Primary Gastroenterologist:  Dr. Jena Gauss  Pre-Procedure History & Physical: HPI:  Kaitlin [name] is a 67 y.o. female here for general follow-up.  GERD and history of colonic adenoma.  She is working on better [employer];  hemoglobin A1c down from 7.5-6.5 she reports Lipid profile.  Need for reflux meds is diminished only takes Protonix once daily as needed no dysphagia.  History of multiple advanced adenomas removed from her colon previously; due for surveillance 2025.  Denies any bowel dysfunction or rectal bleeding. Has fleeting left-sided abdominal pain only when she gets "stressed".  History of H. pylori treated.  H. pylori negative on histology 2016.  Past Medical History:  Diagnosis Date   Anxiety    Colon polyp    April 2008   Depression    Essential hypertension    Folliculitis 09/26/2012   Helicobacter pylori gastritis    AUG 2008 PREVPAC: nausea-->HELIDAC: GI UPSET   Hypothyroidism    Irritable bowel syndrome    Kidney stones    Mixed hyperlipidemia    Mixed stress and urge urinary incontinence 09/26/2012   Obesity    Panic attacks    Reflux    Tubulovillous adenoma 08/07/10   Colonoscopy w/ Dr Darrick Penna w/ 7 polyps removed-bx showed tubular adenomas.  Largest 1cm-hepatic flexure polyp->TA.      Past Surgical History:  Procedure Laterality Date   CHOLECYSTECTOMY     COLONOSCOPY  APR 2008   AC/[place] SIMPLE ADENOMA   COLONOSCOPY  08/07/2010   Dr. Lorane Gell adenomas and tubulovillous adenoma; needs surveillance 2015   COLONOSCOPY N/A 09/18/2013   Dr.Wendelin Reader- normal rectum, 2 diminutive polys in the mid sigmoid segment o/w the remainder of the colonic mucosa appeared normal.hyperplastic poylps   COLONOSCOPY WITH PROPOFOL N/A 02/23/2019   Procedure: COLONOSCOPY WITH PROPOFOL;  Surgeon: Corbin Ade, MD;  [facility];  Service: Endoscopy;  Laterality: N/A;  2:45pm - office unable to reach pt to move her up    ESOPHAGOGASTRODUODENOSCOPY N/A 11/29/2014   RMR: Gastric erosions. Small hiatal hernia. Status post biopsy.    MASS EXCISION  02/28/2011   Procedure: EXCISION MASS;  Surgeon: Fabio Bering, MD;  Location: [facility];  Service: General;  Laterality: Right;  soft tissue mass   POLYPECTOMY  02/23/2019   Procedure: POLYPECTOMY;  Surgeon: Corbin Ade, MD;  [facility];  Service: Endoscopy;;   UPPER GASTROINTESTINAL ENDOSCOPY  AUG 2008 PAIN/NAUSEA   H. PYLORI treated    Prior to Admission medications   Medication Sig Start Date End Date Taking? Authorizing Provider  aspirin EC 81 MG tablet Take 81 mg by mouth daily.   Yes [provider]  blood glucose meter kit and supplies Dispense based on patient and insurance preference. Test once a day E11.9 07/04/15  Yes Merlyn Albert, MD  clonazePAM (KLONOPIN) 1 MG tablet Take 1 tablet (1 mg total) by mouth as needed. 10/13/21  Yes Cook, Jayce G, DO  enalapril (VASOTEC) 20 MG tablet Take 1 tablet (20 mg total) by mouth daily. 08/16/20  Yes Ladona Ridgel, Malena M, DO  escitalopram (LEXAPRO) 10 MG tablet Take 1 tablet (10 mg total) by mouth daily. 01/10/21 07/03/22 Yes Cook, Jayce G, DO  furosemide (LASIX) 20 MG tablet Take 0.5 tablets (10 mg total) by mouth daily. 01/10/21  Yes Cook, Jayce G, DO  glucose blood (CONTOUR NEXT TEST) test strip TEST ONCE DAILY AS DIRECTED 08/14/21  Yes Adriana Simas,  Jayce G, DO  levothyroxine (SYNTHROID) 50 MCG tablet Take 1 tablet (50 mcg total) by mouth daily. 09/09/20  Yes Ladona Ridgel, Malena M, DO  metformin (FORTAMET) 500 MG (OSM) 24 hr tablet Take 500 mg by mouth daily with breakfast.   Yes [provider]  Oxcarbazepine (TRILEPTAL) 300 MG tablet Take 300 mg by mouth 2 (two) times daily.  10/06/18  Yes [provider]  pantoprazole (PROTONIX) 40 MG tablet Take 1 tablet (40 mg total) by mouth 2 (two) times daily before a meal. Patient taking differently: Take 40 mg by mouth 2 (two) times daily before a meal.  Takes sometimes. 12/11/18  Yes Ermalinda Memos S, PA-C  rosuvastatin (CRESTOR) 10 MG tablet Take 1 tablet (10 mg total) by mouth daily. 01/17/21  Yes Cook, Jayce G, DO  verapamil (CALAN-SR) 240 MG CR tablet TAKE 1 TABLET (240 MG TOTAL) BY MOUTH EVERY MORNING. IN THE MORNING 01/22/22  Yes Cook, Massena G, DO    Allergies as of 07/03/2022 - Review Complete 07/03/2022  Allergen Reaction Noted   Augmentin [amoxicillin-pot clavulanate] Diarrhea 06/09/2012   Ipratropium Other (See Comments) 02/16/2019   Levaquin [levofloxacin in d5w] Other (See Comments) 07/14/2012   Rocephin [ceftriaxone sodium in dextrose] Hives and Other (See Comments) 06/09/2012   Sertraline Other (See Comments) 06/26/2019   Valtrex [valacyclovir hcl] Hives and Other (See Comments) 06/09/2012   Zoloft [sertraline hcl] Other (See Comments) 06/09/2012    Family History  Problem Relation Age of Onset   Coronary artery disease Mother    Hypertension Mother    Heart attack Mother    Heart disease Mother    Suicidality Mother    Diabetes Sister    Diabetes Sister    Coronary artery disease Brother        Age 52   Heart attack Brother    Heart disease Brother    Colon cancer Neg Hx    Anesthesia problems Neg Hx    Hypotension Neg Hx    Malignant hyperthermia Neg Hx    Pseudochol deficiency Neg Hx    Colon polyps Neg Hx     Social History   Socioeconomic History   Marital status: Married    Spouse name: Not on file   Number of children: 1   Years of education: Not on file   Highest education level: 12th grade  Occupational History   Not on file  Tobacco Use   Smoking status: Never   Smokeless tobacco: Never   Tobacco comments:    Never Smoked  Vaping Use   Vaping Use: Never used  Substance and Sexual Activity   Alcohol use: Not Currently   Drug use: No   Sexual activity: Not Currently    Birth control/protection: Post-menopausal  Other Topics Concern   Not on file  Social History Narrative   Not on  file   Social Determinants of Health   Financial Resource Strain: Low Risk  (06/07/2020)   Overall Financial Resource Strain (CARDIA)    Difficulty of Paying Living Expenses: Not hard at all  Food Insecurity: No Food Insecurity (06/07/2020)   Hunger Vital Sign    Worried About Running Out of Food in the Last Year: Never true    Ran Out of Food in the Last Year: Never true  Transportation Needs: No Transportation Needs (06/07/2020)   PRAPARE - Administrator, Civil Service (Medical): No    Lack of Transportation (Non-Medical): No  Physical Activity: Inactive (06/07/2020)  Exercise Vital Sign    Days of Exercise per Week: 0 days    Minutes of Exercise per Session: 0 min  Stress: Stress Concern Present (06/07/2020)   [employer] of Occupational Health - Occupational Stress Questionnaire    Feeling of Stress : Rather much  Social Connections: Unknown (06/07/2020)   Social Connection and Isolation Panel [NHANES]    Frequency of Communication with Friends and Family: Three times a week    Frequency of Social Gatherings with Friends and Family: Never    Attends Religious Services: Patient declined    Active Member of Clubs or Organizations: Yes    Attends Banker Meetings: Never    Marital Status: Married  Catering manager Violence: Not At Risk (06/07/2020)   Humiliation, Afraid, Rape, and Kick questionnaire    Fear of Current or Ex-Partner: No    Emotionally Abused: No    Physically Abused: No    Sexually Abused: No    Review of Systems: See HPI, otherwise negative ROS  Physical Exam: BP 128/76 (BP Location: Right Arm, Patient Position: Sitting, Cuff Size: Large)   Pulse 64   Temp (!) 97.2 F (36.2 C) (Temporal)   Ht 5\' 5"  (1.651 m)   Wt 192 lb 9.6 oz (87.4 kg)   SpO2 98%   BMI 32.05 kg/m  General:   Alert,  Well-developed, well-nourished, pleasant and cooperative in NAD Abdomen: Non-distended, normal bowel sounds.  Soft and nontender without appreciable  mass or hepatosplenomegaly.    Impression/Plan: Very pleasant 67 year old lady with a history of multiple colonic adenomas, some advanced, removed over time.  She is due for surveillance colonoscopy next year.  No alarm symptoms at this time.  She was congratulated on improvement in her hemoglobin A1c and lipid profile  GERD is also improved in parallel to developing a more healthy lifestyle.  Recommendations:  I recommend decrease Protonix daily 30 minutes before breakfast.  Will plan to see you back in January 2025 to set up a surveillance colonoscopy  If, in the interim, anything comes up, please do not hesitate to contact me      Notice: This dictation was prepared with Dragon dictation along with smaller phrase technology. Any transcriptional errors that result from this process are unintentional and may not be corrected upon review.

## 2022-07-03 NOTE — Patient Instructions (Addendum)
It was good to see again today!  Congratulations on better control of blood sugars and overall improvement in your health!  As discussed, your need for Protonix decreased given your healthy lifestyle.  I recommend you drop back to 1 Protonix daily 30 minutes before breakfast.  Will plan to see you back in January 2025 to set up a surveillance colonoscopy  If, in the interim, anything comes up, please do not hesitate to contact me

## 2022-07-18 ENCOUNTER — Other Ambulatory Visit: Payer: Self-pay | Admitting: Family Medicine

## 2022-07-18 DIAGNOSIS — I10 Essential (primary) hypertension: Secondary | ICD-10-CM

## 2022-08-11 ENCOUNTER — Ambulatory Visit
Admission: EM | Admit: 2022-08-11 | Discharge: 2022-08-11 | Disposition: A | Discharge status: Home / Self Care | Payer: BC Managed Care – PPO

## 2022-08-11 ENCOUNTER — Other Ambulatory Visit: Payer: Self-pay

## 2022-08-11 DIAGNOSIS — M79609 Pain in unspecified limb: Secondary | ICD-10-CM | POA: Diagnosis not present

## 2022-08-11 DIAGNOSIS — Z87898 Personal history of other specified conditions: Secondary | ICD-10-CM

## 2022-08-11 DIAGNOSIS — R202 Paresthesia of skin: Secondary | ICD-10-CM | POA: Diagnosis not present

## 2022-08-11 DIAGNOSIS — Z8639 Personal history of other endocrine, nutritional and metabolic disease: Secondary | ICD-10-CM

## 2022-08-11 LAB — POCT FASTING CBG KUC MANUAL ENTRY: POCT Glucose (KUC): 103 mg/dL — AB (ref 70–99)

## 2022-08-11 MED ORDER — KETOROLAC TROMETHAMINE 30 MG/ML IJ SOLN
30.0000 mg | Freq: Once | INTRAMUSCULAR | Status: AC
  Administered 2022-08-11: 30 mg via INTRAMUSCULAR

## 2022-08-11 MED ORDER — DEXAMETHASONE SODIUM PHOSPHATE 10 MG/ML IJ SOLN
10.0000 mg | INTRAMUSCULAR | Status: AC
  Administered 2022-08-11: 10 mg via INTRAMUSCULAR

## 2022-08-11 NOTE — Discharge Instructions (Addendum)
You were given Decadron 10 mg and Toradol 30 mg in your muscle today. Continue Tylenol as needed for pain or discomfort. May apply ice or heat to the right upper extremity as needed for pain or discomfort.  Apply for 20 minutes, remove for 1 hour, then repeat as needed. Continue to massage the right upper extremity to promote circulation. If you develop worsening numbness, tingling, chest pain, or new symptoms of slurred speech, change in your vision, weakness in your legs or feet, shortness of breath, or difficulty breathing, please go to the emergency department immediately for further evaluation. Please follow-up with your neurologist within the next 5 to 7 days for reevaluation of your symptoms.  As discussed, it is recommended that you follow through with the nerve conduction study previously recommended. Follow-up as needed.

## 2022-08-11 NOTE — ED Provider Notes (Signed)
[facility]    CSN: [id number] Arrival date & time: 08/11/22  0901      History   Chief Complaint No chief complaint on file.   HPI Kaitlin [name] is a 67 y.o. female.   The history is provided by the patient.   The patient presents for complaints of right hand numbness and tingling.  Patient states symptoms started over the past 24 hours.  Patient states that initially the pain started in her right lower back and moved up her back into the right shoulder into the right hand.  Patient denies injury or trauma, swelling, bruising, fever, chills, abdominal pain, vomiting, or diarrhea.  Patient states that she is nauseated, and has pain in her mid chest.  She states she took Tylenol for her symptoms with no relief.  Review of the patient's chart shows that she does have a history of occipital neuralgia, she has been seen by neurology at [facility].  Patient was last seen in 2019.  At that time, she complained of the same or similar symptoms and was offered a nerve conduction study.  Patient states she did not move forward with that testing as her symptoms went away.  Patient reports that her most recent A1c was around 6 or 6.5.  Past Medical History:  Diagnosis Date   Anxiety    Colon polyp    April 2008   Depression    Essential hypertension    Folliculitis 09/26/2012   Helicobacter pylori gastritis    AUG 2008 PREVPAC: nausea-->HELIDAC: GI UPSET   Hypothyroidism    Irritable bowel syndrome    Kidney stones    Mixed hyperlipidemia    Mixed stress and urge urinary incontinence 09/26/2012   Obesity    Panic attacks    Reflux    Tubulovillous adenoma 08/07/10   Colonoscopy w/ Dr Darrick Penna w/ 7 polyps removed-bx showed tubular adenomas.  Largest 1cm-hepatic flexure polyp->TA.      Patient Active Problem List   Diagnosis Date Noted   Hypercalcemia 01/10/2021   Anxiety 01/10/2021   Musculoskeletal pain 01/10/2021   Plantar fasciitis 01/10/2021   Mixed hyperlipidemia 09/25/2020    Major depression, recurrent, chronic (HCC) 09/30/2019   Osteoarthritis of knee 04/07/2018   Loss of weight 05/27/2017   PTSD (post-traumatic stress disorder) 04/09/2017   MDD (major depressive disorder), recurrent episode, moderate (HCC) 04/09/2017   Diabetes mellitus type 2, diet-controlled (HCC) 12/20/2016   Chondromalacia of left knee 12/11/2016   GERD (gastroesophageal reflux disease) 02/20/2016   Hiatal hernia    Mixed stress and urge urinary incontinence 09/26/2012   Subserous leiomyoma of uterus 09/01/2012   Hypothyroidism 06/12/2012   Venous stasis 06/12/2012   Essential hypertension, benign 12/11/2010   IBS (irritable bowel syndrome) 07/05/2010   Hx of adenomatous colonic polyps 07/05/2010    Past Surgical History:  Procedure Laterality Date   CHOLECYSTECTOMY     COLONOSCOPY  APR 2008   AC/[place] SIMPLE ADENOMA   COLONOSCOPY  08/07/2010   Dr. Lorane Gell adenomas and tubulovillous adenoma; needs surveillance 2015   COLONOSCOPY N/A 09/18/2013   Dr.Rourk- normal rectum, 2 diminutive polys in the mid sigmoid segment o/w the remainder of the colonic mucosa appeared normal.hyperplastic poylps   COLONOSCOPY WITH PROPOFOL N/A 02/23/2019   Procedure: COLONOSCOPY WITH PROPOFOL;  Surgeon: Corbin Ade, MD;  [facility]: [facility];  Service: Endoscopy;  Laterality: N/A;  2:45pm - office unable to reach pt to move her up   ESOPHAGOGASTRODUODENOSCOPY N/A 11/29/2014  RMR: Gastric erosions. Small hiatal hernia. Status post biopsy.    MASS EXCISION  02/28/2011   Procedure: EXCISION MASS;  Surgeon: Fabio Bering, MD;  Location: [facility];  Service: General;  Laterality: Right;  soft tissue mass   POLYPECTOMY  02/23/2019   Procedure: POLYPECTOMY;  Surgeon: Corbin Ade, MD;  [facility];  Service: Endoscopy;;   UPPER GASTROINTESTINAL ENDOSCOPY  AUG 2008 PAIN/NAUSEA   H. PYLORI treated    OB History     Gravida  1   Para  1   Term      Preterm      AB       Living  1      SAB      IAB      Ectopic      Multiple      Live Births  1            Home Medications    Prior to Admission medications   Medication Sig Start Date End Date Taking? Authorizing Provider  Cholecalciferol (D 1000) 25 MCG (1000 UT) capsule Take 1,000 Units by mouth. 09/07/16  Yes [provider]  aspirin EC 81 MG tablet Take 81 mg by mouth daily.    [provider]  blood glucose meter kit and supplies Dispense based on patient and insurance preference. Test once a day E11.9 07/04/15   Merlyn Albert, MD  clonazePAM (KLONOPIN) 1 MG tablet Take 1 tablet (1 mg total) by mouth as needed. 10/13/21   Tommie Sams, DO  enalapril (VASOTEC) 20 MG tablet Take 1 tablet (20 mg total) by mouth daily. 08/16/20   Ladona Ridgel, Malena M, DO  escitalopram (LEXAPRO) 10 MG tablet Take 1 tablet (10 mg total) by mouth daily. 01/10/21 07/03/22  Tommie Sams, DO  furosemide (LASIX) 20 MG tablet Take 0.5 tablets (10 mg total) by mouth daily. 01/10/21   Everlene Other G, DO  glucose blood (CONTOUR NEXT TEST) test strip TEST ONCE DAILY AS DIRECTED 08/14/21   Tommie Sams, DO  levothyroxine (SYNTHROID) 50 MCG tablet Take 1 tablet (50 mcg total) by mouth daily. 09/09/20   Annalee Genta, DO  metformin (FORTAMET) 500 MG (OSM) 24 hr tablet Take 500 mg by mouth daily with breakfast.    [provider]  Oxcarbazepine (TRILEPTAL) 300 MG tablet Take 300 mg by mouth 2 (two) times daily.  10/06/18   [provider]  pantoprazole (PROTONIX) 40 MG tablet Take 1 tablet (40 mg total) by mouth 2 (two) times daily before a meal. Patient taking differently: Take 40 mg by mouth 2 (two) times daily before a meal. Takes sometimes. 12/11/18   Letta Median, PA-C  rosuvastatin (CRESTOR) 10 MG tablet Take 1 tablet (10 mg total) by mouth daily. 01/17/21   Tommie Sams, DO  verapamil (CALAN-SR) 240 MG CR tablet TAKE 1 TABLET (240 MG TOTAL) BY MOUTH EVERY MORNING. IN THE MORNING 07/20/22    Tommie Sams, DO    Family History Family History  Problem Relation Age of Onset   Coronary artery disease Mother    Hypertension Mother    Heart attack Mother    Heart disease Mother    Suicidality Mother    Diabetes Sister    Diabetes Sister    Coronary artery disease Brother        Age 33   Heart attack Brother    Heart disease Brother    Colon  cancer Neg Hx    Anesthesia problems Neg Hx    Hypotension Neg Hx    Malignant hyperthermia Neg Hx    Pseudochol deficiency Neg Hx    Colon polyps Neg Hx     Social History Social History   Tobacco Use   Smoking status: Never   Smokeless tobacco: Never   Tobacco comments:    Never Smoked  Vaping Use   Vaping Use: Never used  Substance Use Topics   Alcohol use: Not Currently   Drug use: No     Allergies   Augmentin [amoxicillin-pot clavulanate], Ipratropium, Levaquin [levofloxacin in d5w], Rocephin [ceftriaxone sodium in dextrose], Sertraline, Valtrex [valacyclovir hcl], and Zoloft [sertraline hcl]   Review of Systems Review of Systems Per HPI  Physical Exam Triage Vital Signs ED Triage Vitals  Enc Vitals Group     BP 08/11/22 1051 (!) 145/84     Pulse Rate 08/11/22 1051 79     Resp 08/11/22 1051 16     Temp 08/11/22 1051 97.7 F (36.5 C)     Temp Source 08/11/22 1051 Oral     SpO2 08/11/22 1051 95 %     Weight --      Height --      Head Circumference --      Peak Flow --      Pain Score 08/11/22 1054 9     Pain Loc --      Pain Edu? --      Excl. in GC? --    No data found.  Updated Vital Signs BP (!) 145/84 (BP Location: Left Arm)   Pulse 79   Temp 97.7 F (36.5 C) (Oral)   Resp 16   SpO2 95%   Visual Acuity Right Eye Distance:   Left Eye Distance:   Bilateral Distance:    Right Eye Near:   Left Eye Near:    Bilateral Near:     Physical Exam Vitals and nursing note reviewed.  Constitutional:      General: She is not in acute distress.    Appearance: Normal appearance.  HENT:      Head: Normocephalic.  Eyes:     Extraocular Movements: Extraocular movements intact.     Conjunctiva/sclera: Conjunctivae normal.     Pupils: Pupils are equal, round, and reactive to light.  Cardiovascular:     Rate and Rhythm: Normal rate and regular rhythm.     Pulses: Normal pulses.     Heart sounds: Normal heart sounds.  Pulmonary:     Effort: Pulmonary effort is normal. No respiratory distress.     Breath sounds: Normal breath sounds. No stridor. No wheezing, rhonchi or rales.  Abdominal:     General: Bowel sounds are normal.     Palpations: Abdomen is soft.     Tenderness: There is no abdominal tenderness.  Musculoskeletal:     Right shoulder: Tenderness present. No swelling. Normal range of motion.     Right upper arm: Normal. No swelling or tenderness.     Right elbow: Normal.     Right forearm: Normal.     Right wrist: Normal.     Right hand: No swelling, deformity or tenderness. Normal range of motion. Normal capillary refill. Normal pulse.     Cervical back: Normal range of motion.     Lumbar back: Tenderness (right lower back) present. No swelling, deformity, lacerations or spasms.  Lymphadenopathy:     Cervical: No cervical adenopathy.  Skin:  General: Skin is warm and dry.  Neurological:     General: No focal deficit present.     Mental Status: She is alert and oriented to person, place, and time.  Psychiatric:        Mood and Affect: Mood normal.        Behavior: Behavior normal.      UC Treatments / Results  Labs (all labs ordered are listed, but only abnormal results are displayed) Labs Reviewed  POCT FASTING CBG [facility] MANUAL ENTRY - Abnormal; Notable for the following components:      Result Value   POCT Glucose ([facility]) 103 (*)    All other components within normal limits    EKG: Normal sinus rhythm, no STEMI.  Compared to EKGs performed on 09/09/2020 and 09/08/2019.   Radiology No results found.  Procedures Procedures (including critical care  time)  Medications Ordered in UC Medications  ketorolac (TORADOL) 30 MG/ML injection 30 mg (30 mg Intramuscular Given 08/11/22 1136)  dexamethasone (DECADRON) injection 10 mg (10 mg Intramuscular Given 08/11/22 1136)    Initial Impression / Assessment and Plan / UC Course  I have reviewed the triage vital signs and the nursing notes.  Pertinent labs & imaging results that were available during my care of the patient were reviewed by me and considered in my medical decision making (see chart for details).  The patient is well-appearing, she is in no acute distress, vital signs are stable.  EKG was negative for STEMI, shows normal sinus rhythm, fingerstick blood glucose was 103.  Patient with complaints of right upper extremity numbness and tingling.  Review of patient's chart shows she does have a history of the same or similar symptoms caused by underlying history of occipital neuralgia.  Patient was given Decadron 10 mg IM and Toradol 30 mg IM for pain.  Supportive care recommendations were provided and discussed with the patient to include use of ice or heat to help with pain or discomfort, and gentle massage to promote circulation.  Patient was advised to follow-up with her neurologist for further evaluation.  Patient was given strict ER follow-up precautions to include worsening numbness and tingling, slurred speech, change in her mental status, worsening chest pain, or other concerns.  Patient is in agreement with this plan of care and verbalizes understanding.  All questions were answered.  Patient stable for discharge.   Final Clinical Impressions(s) / UC Diagnoses   Final diagnoses:  Paresthesia and pain of right extremity  History of diabetes mellitus  History of chest pain     Discharge Instructions      You were given Decadron 10 mg and Toradol 30 mg in your muscle today. Continue Tylenol as needed for pain or discomfort. May apply ice or heat to the right upper extremity as  needed for pain or discomfort.  Apply for 20 minutes, remove for 1 hour, then repeat as needed. Continue to massage the right upper extremity to promote circulation. If you develop worsening numbness, tingling, chest pain, or new symptoms of slurred speech, change in your vision, weakness in your legs or feet, shortness of breath, or difficulty breathing, please go to the emergency department immediately for further evaluation. Please follow-up with your neurologist within the next 5 to 7 days for reevaluation of your symptoms.  As discussed, it is recommended that you follow through with the nerve conduction study previously recommended. Follow-up as needed.     ED Prescriptions   None    PDMP  not reviewed this encounter.   Abran Cantor, NP 08/11/22 1145

## 2022-08-11 NOTE — ED Triage Notes (Addendum)
Pt c/o right hand pain, that started in the lower back and radiates up to te shoulder into the hand, x 1 week pt states hand has been numb all night.

## 2022-08-18 ENCOUNTER — Other Ambulatory Visit: Payer: Self-pay | Admitting: Family Medicine

## 2022-08-18 DIAGNOSIS — I10 Essential (primary) hypertension: Secondary | ICD-10-CM

## 2022-08-30 ENCOUNTER — Other Ambulatory Visit (HOSPITAL_COMMUNITY): Payer: Self-pay | Admitting: Obstetrics & Gynecology

## 2022-08-30 DIAGNOSIS — Z1231 Encounter for screening mammogram for malignant neoplasm of breast: Secondary | ICD-10-CM

## 2022-09-12 ENCOUNTER — Ambulatory Visit (HOSPITAL_COMMUNITY)
Admission: RE | Admit: 2022-09-12 | Discharge: 2022-09-12 | Disposition: A | Discharge status: Home / Self Care | Payer: BC Managed Care – PPO | Source: Ambulatory Visit | Attending: Obstetrics & Gynecology | Admitting: Obstetrics & Gynecology

## 2022-09-12 DIAGNOSIS — Z1231 Encounter for screening mammogram for malignant neoplasm of breast: Secondary | ICD-10-CM | POA: Diagnosis not present

## 2022-10-29 ENCOUNTER — Other Ambulatory Visit: Payer: Self-pay | Admitting: Family Medicine

## 2022-12-24 ENCOUNTER — Ambulatory Visit (INDEPENDENT_AMBULATORY_CARE_PROVIDER_SITE_OTHER): Payer: Self-pay | Admitting: Physician Assistant

## 2022-12-24 ENCOUNTER — Encounter: Payer: Self-pay | Admitting: Physician Assistant

## 2022-12-24 ENCOUNTER — Other Ambulatory Visit: Payer: Self-pay

## 2022-12-24 VITALS — BP 150/70 | HR 86 | Temp 98.7°F | Ht 65.0 in | Wt 193.0 lb

## 2022-12-24 DIAGNOSIS — B002 Herpesviral gingivostomatitis and pharyngotonsillitis: Secondary | ICD-10-CM

## 2022-12-24 MED ORDER — ACYCLOVIR 800 MG PO TABS
800.0000 mg | ORAL_TABLET | Freq: Every day | ORAL | 0 refills | Status: DC
Start: 2022-12-24 — End: 2022-12-24

## 2022-12-24 NOTE — Progress Notes (Signed)
[facility] [address] [place], [place] [address]   Office Visit Note  Patient Name: Kaitlin [name] Date of Birth [id number]  Medical Record number [id number]  Date of Service: 12/24/2022  Chief Complaint  Patient presents with   Acute Visit    Patient states she has been having chills, nasal drainage, and headaches since getting a high-dose flu shot one week ago. She reports a lack of energy as well. She also states she broke out with fever blisters around her mouth yesterday. She has not taken any OTC meds.      67 y/o F presents to the clinic for c/o oral outbreak of cold sores x 1 day. Pt had URI symptoms last week. Denies painful eating or drinking.       Current Medication:  Outpatient Encounter Medications as of 12/24/2022  Medication Sig   aspirin EC 81 MG tablet Take 81 mg by mouth daily.   blood glucose meter kit and supplies Dispense based on patient and insurance preference. Test once a day E11.9   busPIRone (BUSPAR) 5 MG tablet Take 1 tablet by mouth 2 (two) times daily.   Cholecalciferol (D 1000) 25 MCG (1000 UT) capsule Take 1,000 Units by mouth daily.   clonazePAM (KLONOPIN) 1 MG tablet Take 1 tablet (1 mg total) by mouth as needed.   enalapril (VASOTEC) 20 MG tablet Take 1 tablet (20 mg total) by mouth daily.   escitalopram (LEXAPRO) 10 MG tablet Take 1 tablet (10 mg total) by mouth daily.   eszopiclone (LUNESTA) 2 MG TABS tablet Take 2 mg by mouth at bedtime as needed.   furosemide (LASIX) 20 MG tablet Take 0.5 tablets (10 mg total) by mouth daily.   glucose blood (CONTOUR NEXT TEST) test strip TEST ONCE DAILY AS DIRECTED   levothyroxine (SYNTHROID) 50 MCG tablet Take 1 tablet (50 mcg total) by mouth daily.   metformin (FORTAMET) 500 MG (OSM) 24 hr tablet Take 500 mg by mouth daily with breakfast.   pantoprazole (PROTONIX) 40 MG tablet Take 1 tablet (40 mg total) by mouth 2 (two) times daily before a meal. (Patient taking differently: Take 40 mg by  mouth 2 (two) times daily as needed. Takes sometimes.)   rosuvastatin (CRESTOR) 10 MG tablet Take 1 tablet (10 mg total) by mouth daily.   verapamil (CALAN-SR) 240 MG CR tablet TAKE 1 TABLET (240 MG TOTAL) BY MOUTH EVERY MORNING. IN THE MORNING   [DISCONTINUED] acyclovir (ZOVIRAX) 800 MG tablet Take 1 tablet (800 mg total) by mouth 5 (five) times daily.   [DISCONTINUED] Oxcarbazepine (TRILEPTAL) 300 MG tablet Take 300 mg by mouth 2 (two) times daily.    No facility-administered encounter medications on file as of 12/24/2022.      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  Largest 1cm-hepatic flexure polyp->TA.       Vital Signs: BP (!) 150/70  Pulse 86   Temp 98.7 F (37.1 C)   Ht 5\' 5"  (1.651 m)   Wt 193 lb (87.5 kg)   SpO2 96%   BMI 32.12 kg/m    Review of Systems  Constitutional: Negative.   HENT: Negative.    Skin:  Positive for rash.  Neurological: Negative.     Physical Exam Constitutional:      Appearance: Normal appearance.  HENT:     Head: Normocephalic and atraumatic.     Right Ear: External ear normal.     Left Ear: External ear normal.  Eyes:     Extraocular Movements: Extraocular movements intact.  Skin:    General: Skin is warm and dry.     Comments: Lips: 4 single scattered vesicular lesions on upper and lower lips.   Neurological:     Mental Status: She is alert and oriented to person, place, and time.  Psychiatric:        Mood and Affect: Mood normal.         Behavior: Behavior normal.       Assessment/Plan:  1. Oral herpes simplex infection  Apply otc Abreva to the lesions as directed on the box. Will defer oral anti-viral medicine since she had an allergic reaction(generalized hives) to Valacyclovir.  Outbreak should resolve in 7-14 days. Pt verbalized understanding and in agreement.  RTC in 2 days for a follow up.  General Counseling: donisha fracassi understanding of the findings of todays visit and agrees with plan of treatment. I have discussed any further diagnostic evaluation that may be needed or ordered today. We also reviewed her medications today. she has been encouraged to call the office with any questions or concerns that should arise related to todays visit.    Time spent:20 Minutes    Gilberto Better, [place] Physician Assistant

## 2022-12-26 ENCOUNTER — Other Ambulatory Visit: Payer: Self-pay

## 2022-12-26 ENCOUNTER — Encounter: Payer: Self-pay | Admitting: Physician Assistant

## 2022-12-26 ENCOUNTER — Ambulatory Visit (INDEPENDENT_AMBULATORY_CARE_PROVIDER_SITE_OTHER): Payer: Self-pay | Admitting: Physician Assistant

## 2022-12-26 VITALS — BP 110/70 | HR 74 | Temp 97.8°F

## 2022-12-26 DIAGNOSIS — B002 Herpesviral gingivostomatitis and pharyngotonsillitis: Secondary | ICD-10-CM

## 2022-12-26 NOTE — Progress Notes (Signed)
[facility] [address] [place], [place] [address]   Office Visit Note  Patient Name: Kaitlin [name] Date of Birth [id number]  Medical Record number [id number]  Date of Service: 12/26/2022  Chief Complaint  Patient presents with   Follow-up    Patient states she is feeling better and her oral blisters are starting to heal.     67 y/o F presents to the clinic for a f/u for oral herpes infection. She admits to feeling better. Currently applying otc Abreva. Has had allergic reaction to Valacyclovir, so she is not taking any anti-viral medicine currently.       Current Medication:  Outpatient Encounter Medications as of 12/26/2022  Medication Sig   aspirin EC 81 MG tablet Take 81 mg by mouth daily.   blood glucose meter kit and supplies Dispense based on patient and insurance preference. Test once a day E11.9   busPIRone (BUSPAR) 5 MG tablet Take 1 tablet by mouth 2 (two) times daily.   Cholecalciferol (D 1000) 25 MCG (1000 UT) capsule Take 1,000 Units by mouth daily.   clonazePAM (KLONOPIN) 1 MG tablet Take 1 tablet (1 mg total) by mouth as needed.   enalapril (VASOTEC) 20 MG tablet Take 1 tablet (20 mg total) by mouth daily.   escitalopram (LEXAPRO) 10 MG tablet Take 1 tablet (10 mg total) by mouth daily.   eszopiclone (LUNESTA) 2 MG TABS tablet Take 2 mg by mouth at bedtime as needed.   furosemide (LASIX) 20 MG tablet Take 0.5 tablets (10 mg total) by mouth daily.   glucose blood (CONTOUR NEXT TEST) test strip TEST ONCE DAILY AS DIRECTED   levothyroxine (SYNTHROID) 50 MCG tablet Take 1 tablet (50 mcg total) by mouth daily.   metformin (FORTAMET) 500 MG (OSM) 24 hr tablet Take 500 mg by mouth daily with breakfast.   pantoprazole (PROTONIX) 40 MG tablet Take 1 tablet (40 mg total) by mouth 2 (two) times daily before a meal. (Patient taking differently: Take 40 mg by mouth 2 (two) times daily as needed. Takes sometimes.)   rosuvastatin (CRESTOR) 10 MG tablet Take 1  tablet (10 mg total) by mouth daily.   verapamil (CALAN-SR) 240 MG CR tablet TAKE 1 TABLET (240 MG TOTAL) BY MOUTH EVERY MORNING. IN THE MORNING   No facility-administered encounter medications on file as of 12/26/2022.      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  Largest 1cm-hepatic flexure polyp->TA.       Vital Signs: BP 110/70   Pulse 74   Temp 97.8 F (36.6 C)   SpO2 93%    Review of Systems  Skin:  Positive for rash.    Physical Exam Constitutional:      Appearance: Normal appearance.  HENT:     Head: Normocephalic and atraumatic.     Right Ear: External ear normal.     Left Ear: External  ear normal.  Eyes:     Extraocular Movements: Extraocular movements intact.  Skin:    General: Skin is warm and dry.     Comments: Healing vesicular lesions on left side of upper and lower lip, worse lesion on the left lower lip.  Neurological:     Mental Status: She is alert and oriented to person, place, and time.  Psychiatric:        Mood and Affect: Mood normal.        Behavior: Behavior normal.       Assessment/Plan:  1. Oral herpes simplex infection  Continue with otc Abreva as directed on the box.  RTC if symptoms don't continue to improve. Pt verbalized understanding and in agreement.    General Counseling: [facility] pokorny understanding of the findings of todays visit and agrees with plan of treatment. I have discussed any further diagnostic evaluation that may be  needed or ordered today. We also reviewed her medications today. she has been encouraged to call the office with any questions or concerns that should arise related to todays visit.    Time spent:20 Minutes    Gilberto Better, [place] Physician Assistant

## 2023-02-28 ENCOUNTER — Encounter: Payer: Self-pay | Admitting: Internal Medicine

## 2023-04-19 ENCOUNTER — Encounter: Payer: Self-pay | Admitting: *Deleted

## 2023-04-19 ENCOUNTER — Other Ambulatory Visit: Payer: Self-pay | Admitting: *Deleted

## 2023-04-19 ENCOUNTER — Ambulatory Visit: Payer: BC Managed Care – PPO | Admitting: Internal Medicine

## 2023-04-19 ENCOUNTER — Encounter: Payer: Self-pay | Admitting: Internal Medicine

## 2023-04-19 VITALS — BP 128/74 | HR 77 | Temp 98.6°F | Ht 65.0 in | Wt 195.2 lb

## 2023-04-19 DIAGNOSIS — Z860101 Personal history of adenomatous and serrated colon polyps: Secondary | ICD-10-CM | POA: Diagnosis not present

## 2023-04-19 DIAGNOSIS — K219 Gastro-esophageal reflux disease without esophagitis: Secondary | ICD-10-CM | POA: Diagnosis not present

## 2023-04-19 MED ORDER — PEG 3350-KCL-NA BICARB-NACL 420 G PO SOLR: 4000.0000 mL | Freq: Once | ORAL | 0 refills | Status: AC

## 2023-04-19 NOTE — Progress Notes (Signed)
Primary Care Physician:  Enid Baas, MD Primary Gastroenterologist:  Dr. Jena Gauss  Pre-Procedure History & Physical: HPI:  Kaitlin [name] is a 68 y.o. female here for follow-up GERD and history of advanced adenomas.  She has had multiple colonic polyps removed over time.  She is due for surveillance colonoscopy at this time.  She complains of increased gas and flatulence from time to time but no bleeding no other change in bowel habits.  GERD well-controlled on Protonix 40 g twice daily.  No dysphagia.  She has gained 2 pounds since her last visit.  She remains significantly overweight.  Past Medical History:  Diagnosis Date   Anxiety    Colon polyp    April 2008   Depression    Essential hypertension    Folliculitis 09/26/2012   Helicobacter pylori gastritis    AUG 2008 PREVPAC: nausea-->HELIDAC: GI UPSET   Hypothyroidism    Irritable bowel syndrome    Kidney stones    Mixed hyperlipidemia    Mixed stress and urge urinary incontinence 09/26/2012   Obesity    Panic attacks    Reflux    Tubulovillous adenoma 08/07/10   Colonoscopy w/ Dr Darrick Penna w/ 7 polyps removed-bx showed tubular adenomas.  Largest 1cm-hepatic flexure polyp->TA.      Past Surgical History:  Procedure Laterality Date   CHOLECYSTECTOMY     COLONOSCOPY  APR 2008   AC/[place] SIMPLE ADENOMA   COLONOSCOPY  08/07/2010   Dr. Lorane Gell adenomas and tubulovillous adenoma; needs surveillance 2015   COLONOSCOPY N/A 09/18/2013   Dr.Malli Falotico- normal rectum, 2 diminutive polys in the mid sigmoid segment o/w the remainder of the colonic mucosa appeared normal.hyperplastic poylps   COLONOSCOPY WITH PROPOFOL N/A 02/23/2019   Procedure: COLONOSCOPY WITH PROPOFOL;  Surgeon: Corbin Ade, MD;  Location: [facility];  Service: Endoscopy;  Laterality: N/A;  2:45pm - office unable to reach pt to move her up   ESOPHAGOGASTRODUODENOSCOPY N/A 11/29/2014   RMR: Gastric erosions. Small hiatal hernia. Status post biopsy.    MASS  EXCISION  02/28/2011   Procedure: EXCISION MASS;  Surgeon: Fabio Bering, MD;  Location: [facility];  Service: General;  Laterality: Right;  soft tissue mass   POLYPECTOMY  02/23/2019   Procedure: POLYPECTOMY;  Surgeon: Corbin Ade, MD;  Location: [facility];  Service: Endoscopy;;   UPPER GASTROINTESTINAL ENDOSCOPY  AUG 2008 PAIN/NAUSEA   H. PYLORI treated    Prior to Admission medications   Medication Sig Start Date End Date Taking? Authorizing Provider  aspirin EC 81 MG tablet Take 81 mg by mouth daily.   Yes [provider]  blood glucose meter kit and supplies Dispense based on patient and insurance preference. Test once a day E11.9 07/04/15  Yes Merlyn Albert, MD  busPIRone (BUSPAR) 5 MG tablet Take 1 tablet by mouth 2 (two) times daily. 12/19/22  Yes [provider]  Cholecalciferol (D 1000) 25 MCG (1000 UT) capsule Take 1,000 Units by mouth daily. 09/07/16  Yes [provider]  clonazePAM (KLONOPIN) 1 MG tablet Take 1 tablet (1 mg total) by mouth as needed. 10/13/21  Yes Cook, Jayce G, DO  enalapril (VASOTEC) 20 MG tablet Take 1 tablet (20 mg total) by mouth daily. 08/16/20  Yes Ladona Ridgel, Malena M, DO  escitalopram (LEXAPRO) 10 MG tablet Take 1 tablet (10 mg total) by mouth daily. 01/10/21  Yes Cook, Jayce G, DO  levothyroxine (SYNTHROID) 50 MCG tablet Take 1 tablet (50 mcg  total) by mouth daily. 09/09/20  Yes Ladona Ridgel, Malena M, DO  metformin (FORTAMET) 500 MG (OSM) 24 hr tablet Take 500 mg by mouth daily with breakfast.   Yes [provider]  rosuvastatin (CRESTOR) 10 MG tablet Take 1 tablet (10 mg total) by mouth daily. 01/17/21  Yes Cook, Jayce G, DO  verapamil (CALAN-SR) 240 MG CR tablet TAKE 1 TABLET (240 MG TOTAL) BY MOUTH EVERY MORNING. IN THE MORNING 07/20/22  Yes Cook, Jayce G, DO  eszopiclone (LUNESTA) 2 MG TABS tablet Take 2 mg by mouth at bedtime as needed. Patient not taking: Reported on 04/19/2023 09/10/22   [provider]  furosemide  (LASIX) 20 MG tablet Take 0.5 tablets (10 mg total) by mouth daily. Patient not taking: Reported on 04/19/2023 01/10/21   Tommie Sams, DO  glucose blood (CONTOUR NEXT TEST) test strip TEST ONCE DAILY AS DIRECTED Patient not taking: Reported on 04/19/2023 08/14/21   Tommie Sams, DO  pantoprazole (PROTONIX) 40 MG tablet Take 1 tablet (40 mg total) by mouth 2 (two) times daily before a meal. Patient not taking: Reported on 04/19/2023 12/11/18   Letta Median, PA-C    Allergies as of 04/19/2023 - Review Complete 04/19/2023  Allergen Reaction Noted   Augmentin [amoxicillin-pot clavulanate] Diarrhea 06/09/2012   Ipratropium Other (See Comments) 02/16/2019   Levaquin [levofloxacin in d5w] Other (See Comments) 07/14/2012   Rocephin [ceftriaxone sodium in dextrose] Hives and Other (See Comments) 06/09/2012   Sertraline Other (See Comments) 06/26/2019   Valtrex [valacyclovir hcl] Hives and Other (See Comments) 06/09/2012   Zoloft [sertraline hcl] Other (See Comments) 06/09/2012    Family History  Problem Relation Age of Onset   Coronary artery disease Mother    Hypertension Mother    Heart attack Mother    Heart disease Mother    Suicidality Mother    Diabetes Sister    Diabetes Sister    Coronary artery disease Brother        Age 70   Heart attack Brother    Heart disease Brother    Colon cancer Neg Hx    Anesthesia problems Neg Hx    Hypotension Neg Hx    Malignant hyperthermia Neg Hx    Pseudochol deficiency Neg Hx    Colon polyps Neg Hx     Social History   Socioeconomic History   Marital status: Married    Spouse name: Not on file   Number of children: 1   Years of education: Not on file   Highest education level: 12th grade  Occupational History   Not on file  Tobacco Use   Smoking status: Never   Smokeless tobacco: Never   Tobacco comments:    Never Smoked  Vaping Use   Vaping status: Never Used  Substance and Sexual Activity   Alcohol use: Not Currently    Drug use: No   Sexual activity: Not Currently    Birth control/protection: Post-menopausal  Other Topics Concern   Not on file  Social History Narrative   Not on file   Social Drivers of Health   Financial Resource Strain: Low Risk  (11/19/2022)   Received from [facility]   Overall Financial Resource Strain (CARDIA)    Difficulty of Paying Living Expenses: Not hard at all  Food Insecurity: Food Insecurity Present (11/19/2022)   Received from [facility]   Hunger Vital Sign    Worried About Running Out of Food in the Last  Year: Often true    Ran Out of Food in the Last Year: Often true  Transportation Needs: No Transportation Needs (11/19/2022)   Received from [facility] - Transportation    In the past 12 months, has lack of transportation kept you from medical appointments or from getting medications?: No    Lack of Transportation (Non-Medical): No  Physical Activity: Inactive (06/07/2020)   Exercise Vital Sign    Days of Exercise per Week: 0 days    Minutes of Exercise per Session: 0 min  Stress: Stress Concern Present (06/07/2020)   [employer] of Occupational Health - Occupational Stress Questionnaire    Feeling of Stress : Rather much  Social Connections: Unknown (06/07/2020)   Social Connection and Isolation Panel [NHANES]    Frequency of Communication with Friends and Family: Three times a week    Frequency of Social Gatherings with Friends and Family: Never    Attends Religious Services: Patient declined    Active Member of Clubs or Organizations: Yes    Attends Banker Meetings: Never    Marital Status: Married  Catering manager Violence: Not At Risk (06/07/2020)   Humiliation, Afraid, Rape, and Kick questionnaire    Fear of Current or Ex-Partner: No    Emotionally Abused: No    Physically Abused: No    Sexually Abused: No    Review of Systems: See HPI, otherwise negative ROS  Physical  Exam: BP 128/74   Pulse 77   Temp 98.6 F (37 C)   Ht 5\' 5"  (1.651 m)   Wt 195 lb 3.2 oz (88.5 kg)   BMI 32.48 kg/m  General:   Alert,  Well-developed, well-nourished, pleasant and cooperative in NAD Lungs:  Clear throughout to auscultation.   No wheezes, crackles, or rhonchi. No acute distress. Heart:  Regular rate and rhythm; no murmurs, clicks, rubs,  or gallops. Abdomen: Non-distended, normal bowel sounds.  Soft and nontender without appreciable mass or hepatosplenomegaly.   Impression/Plan: 68 year old lady with obesity, GERD and history of advanced adenomas removed previously.  Overall doing well.  Has increased flatulence from time to time  -  well-controlled.  She is due for a surveillance colonoscopy at this time.  I have offered her surveillance colonoscopy in the near future.    The risks, benefits, limitations, alternatives and imponderables have been reviewed with the patient. Questions have been answered. All parties are agreeable.    Weight Loss and regular exercise recommended.  I like to see her lose 10 pounds in the next 6 months  May continue Protonix 40 mg twice daily at this time.  Will provide information on bloat/gas diet.  Could try one of the  probiotics for symptoms (digestive advantage, align, Vear Clock colon health)  Further recommendations to follow.     Notice: This dictation was prepared with Dragon dictation along with smaller phrase technology. Any transcriptional errors that result from this process are unintentional and may not be corrected upon review.

## 2023-04-19 NOTE — Patient Instructions (Addendum)
It was good to see you today.  As discussed we will set up a surveillance colonoscopy (history of polyps) ASA 2.  Continue Protonix twice daily for control of reflux  As discussed, weight loss would be of great benefit in helping with all of your health issues.  Would like to see you lose 10 pounds in the next 6 months.  Gas bloat will provide antibloat gas diet information  Try probiotic such as digestive advantage for gas bloat, align or Vear Clock colon health all over-the-counter  Further recommendations to follow.

## 2023-04-19 NOTE — H&P (View-Only) (Signed)
 Primary Care Physician:  Enid Baas, MD Primary Gastroenterologist:  Dr. Jena Gauss  Pre-Procedure History & Physical: HPI:  Kaitlin [name] is a 68 y.o. female here for follow-up GERD and history of advanced adenomas.  She has had multiple colonic polyps removed over time.  She is due for surveillance colonoscopy at this time.  She complains of increased gas and flatulence from time to time but no bleeding no other change in bowel habits.  GERD well-controlled on Protonix 40 g twice daily.  No dysphagia.  She has gained 2 pounds since her last visit.  She remains significantly overweight.  Past Medical History:  Diagnosis Date   Anxiety    Colon polyp    April 2008   Depression    Essential hypertension    Folliculitis 09/26/2012   Helicobacter pylori gastritis    AUG 2008 PREVPAC: nausea-->HELIDAC: GI UPSET   Hypothyroidism    Irritable bowel syndrome    Kidney stones    Mixed hyperlipidemia    Mixed stress and urge urinary incontinence 09/26/2012   Obesity    Panic attacks    Reflux    Tubulovillous adenoma 08/07/10   Colonoscopy w/ Dr Darrick Penna w/ 7 polyps removed-bx showed tubular adenomas.  Largest 1cm-hepatic flexure polyp->TA.      Past Surgical History:  Procedure Laterality Date   CHOLECYSTECTOMY     COLONOSCOPY  APR 2008   AC/[place] SIMPLE ADENOMA   COLONOSCOPY  08/07/2010   Dr. Lorane Gell adenomas and tubulovillous adenoma; needs surveillance 2015   COLONOSCOPY N/A 09/18/2013   Dr.Malli Falotico- normal rectum, 2 diminutive polys in the mid sigmoid segment o/w the remainder of the colonic mucosa appeared normal.hyperplastic poylps   COLONOSCOPY WITH PROPOFOL N/A 02/23/2019   Procedure: COLONOSCOPY WITH PROPOFOL;  Surgeon: Corbin Ade, MD;  Location: [facility];  Service: Endoscopy;  Laterality: N/A;  2:45pm - office unable to reach pt to move her up   ESOPHAGOGASTRODUODENOSCOPY N/A 11/29/2014   RMR: Gastric erosions. Small hiatal hernia. Status post biopsy.    MASS  EXCISION  02/28/2011   Procedure: EXCISION MASS;  Surgeon: Fabio Bering, MD;  Location: [facility];  Service: General;  Laterality: Right;  soft tissue mass   POLYPECTOMY  02/23/2019   Procedure: POLYPECTOMY;  Surgeon: Corbin Ade, MD;  Location: [facility];  Service: Endoscopy;;   UPPER GASTROINTESTINAL ENDOSCOPY  AUG 2008 PAIN/NAUSEA   H. PYLORI treated    Prior to Admission medications   Medication Sig Start Date End Date Taking? Authorizing Provider  aspirin EC 81 MG tablet Take 81 mg by mouth daily.   Yes [provider]  blood glucose meter kit and supplies Dispense based on patient and insurance preference. Test once a day E11.9 07/04/15  Yes Merlyn Albert, MD  busPIRone (BUSPAR) 5 MG tablet Take 1 tablet by mouth 2 (two) times daily. 12/19/22  Yes [provider]  Cholecalciferol (D 1000) 25 MCG (1000 UT) capsule Take 1,000 Units by mouth daily. 09/07/16  Yes [provider]  clonazePAM (KLONOPIN) 1 MG tablet Take 1 tablet (1 mg total) by mouth as needed. 10/13/21  Yes Cook, Jayce G, DO  enalapril (VASOTEC) 20 MG tablet Take 1 tablet (20 mg total) by mouth daily. 08/16/20  Yes Ladona Ridgel, Malena M, DO  escitalopram (LEXAPRO) 10 MG tablet Take 1 tablet (10 mg total) by mouth daily. 01/10/21  Yes Cook, Jayce G, DO  levothyroxine (SYNTHROID) 50 MCG tablet Take 1 tablet (50 mcg  total) by mouth daily. 09/09/20  Yes Ladona Ridgel, Malena M, DO  metformin (FORTAMET) 500 MG (OSM) 24 hr tablet Take 500 mg by mouth daily with breakfast.   Yes [provider]  rosuvastatin (CRESTOR) 10 MG tablet Take 1 tablet (10 mg total) by mouth daily. 01/17/21  Yes Cook, Jayce G, DO  verapamil (CALAN-SR) 240 MG CR tablet TAKE 1 TABLET (240 MG TOTAL) BY MOUTH EVERY MORNING. IN THE MORNING 07/20/22  Yes Cook, Jayce G, DO  eszopiclone (LUNESTA) 2 MG TABS tablet Take 2 mg by mouth at bedtime as needed. Patient not taking: Reported on 04/19/2023 09/10/22   [provider]  furosemide  (LASIX) 20 MG tablet Take 0.5 tablets (10 mg total) by mouth daily. Patient not taking: Reported on 04/19/2023 01/10/21   Tommie Sams, DO  glucose blood (CONTOUR NEXT TEST) test strip TEST ONCE DAILY AS DIRECTED Patient not taking: Reported on 04/19/2023 08/14/21   Tommie Sams, DO  pantoprazole (PROTONIX) 40 MG tablet Take 1 tablet (40 mg total) by mouth 2 (two) times daily before a meal. Patient not taking: Reported on 04/19/2023 12/11/18   Letta Median, PA-C    Allergies as of 04/19/2023 - Review Complete 04/19/2023  Allergen Reaction Noted   Augmentin [amoxicillin-pot clavulanate] Diarrhea 06/09/2012   Ipratropium Other (See Comments) 02/16/2019   Levaquin [levofloxacin in d5w] Other (See Comments) 07/14/2012   Rocephin [ceftriaxone sodium in dextrose] Hives and Other (See Comments) 06/09/2012   Sertraline Other (See Comments) 06/26/2019   Valtrex [valacyclovir hcl] Hives and Other (See Comments) 06/09/2012   Zoloft [sertraline hcl] Other (See Comments) 06/09/2012    Family History  Problem Relation Age of Onset   Coronary artery disease Mother    Hypertension Mother    Heart attack Mother    Heart disease Mother    Suicidality Mother    Diabetes Sister    Diabetes Sister    Coronary artery disease Brother        Age 70   Heart attack Brother    Heart disease Brother    Colon cancer Neg Hx    Anesthesia problems Neg Hx    Hypotension Neg Hx    Malignant hyperthermia Neg Hx    Pseudochol deficiency Neg Hx    Colon polyps Neg Hx     Social History   Socioeconomic History   Marital status: Married    Spouse name: Not on file   Number of children: 1   Years of education: Not on file   Highest education level: 12th grade  Occupational History   Not on file  Tobacco Use   Smoking status: Never   Smokeless tobacco: Never   Tobacco comments:    Never Smoked  Vaping Use   Vaping status: Never Used  Substance and Sexual Activity   Alcohol use: Not Currently    Drug use: No   Sexual activity: Not Currently    Birth control/protection: Post-menopausal  Other Topics Concern   Not on file  Social History Narrative   Not on file   Social Drivers of Health   Financial Resource Strain: Low Risk  (11/19/2022)   Received from [facility]   Overall Financial Resource Strain (CARDIA)    Difficulty of Paying Living Expenses: Not hard at all  Food Insecurity: Food Insecurity Present (11/19/2022)   Received from [facility]   Hunger Vital Sign    Worried About Running Out of Food in the Last  Year: Often true    Ran Out of Food in the Last Year: Often true  Transportation Needs: No Transportation Needs (11/19/2022)   Received from [facility] - Transportation    In the past 12 months, has lack of transportation kept you from medical appointments or from getting medications?: No    Lack of Transportation (Non-Medical): No  Physical Activity: Inactive (06/07/2020)   Exercise Vital Sign    Days of Exercise per Week: 0 days    Minutes of Exercise per Session: 0 min  Stress: Stress Concern Present (06/07/2020)   [employer] of Occupational Health - Occupational Stress Questionnaire    Feeling of Stress : Rather much  Social Connections: Unknown (06/07/2020)   Social Connection and Isolation Panel [NHANES]    Frequency of Communication with Friends and Family: Three times a week    Frequency of Social Gatherings with Friends and Family: Never    Attends Religious Services: Patient declined    Active Member of Clubs or Organizations: Yes    Attends Banker Meetings: Never    Marital Status: Married  Catering manager Violence: Not At Risk (06/07/2020)   Humiliation, Afraid, Rape, and Kick questionnaire    Fear of Current or Ex-Partner: No    Emotionally Abused: No    Physically Abused: No    Sexually Abused: No    Review of Systems: See HPI, otherwise negative ROS  Physical  Exam: BP 128/74   Pulse 77   Temp 98.6 F (37 C)   Ht 5\' 5"  (1.651 m)   Wt 195 lb 3.2 oz (88.5 kg)   BMI 32.48 kg/m  General:   Alert,  Well-developed, well-nourished, pleasant and cooperative in NAD Lungs:  Clear throughout to auscultation.   No wheezes, crackles, or rhonchi. No acute distress. Heart:  Regular rate and rhythm; no murmurs, clicks, rubs,  or gallops. Abdomen: Non-distended, normal bowel sounds.  Soft and nontender without appreciable mass or hepatosplenomegaly.   Impression/Plan: 68 year old lady with obesity, GERD and history of advanced adenomas removed previously.  Overall doing well.  Has increased flatulence from time to time  -  well-controlled.  She is due for a surveillance colonoscopy at this time.  I have offered her surveillance colonoscopy in the near future.    The risks, benefits, limitations, alternatives and imponderables have been reviewed with the patient. Questions have been answered. All parties are agreeable.    Weight Loss and regular exercise recommended.  I like to see her lose 10 pounds in the next 6 months  May continue Protonix 40 mg twice daily at this time.  Will provide information on bloat/gas diet.  Could try one of the  probiotics for symptoms (digestive advantage, align, Vear Clock colon health)  Further recommendations to follow.     Notice: This dictation was prepared with Dragon dictation along with smaller phrase technology. Any transcriptional errors that result from this process are unintentional and may not be corrected upon review.

## 2023-05-06 ENCOUNTER — Encounter: Payer: Self-pay | Admitting: Oncology

## 2023-05-06 ENCOUNTER — Other Ambulatory Visit: Payer: Self-pay

## 2023-05-06 ENCOUNTER — Ambulatory Visit (INDEPENDENT_AMBULATORY_CARE_PROVIDER_SITE_OTHER): Payer: Self-pay | Admitting: Oncology

## 2023-05-06 VITALS — BP 144/62 | HR 80 | Temp 97.5°F | Ht 65.0 in

## 2023-05-06 DIAGNOSIS — J302 Other seasonal allergic rhinitis: Secondary | ICD-10-CM

## 2023-05-06 NOTE — Progress Notes (Signed)
 [facility] [address] [place], [place] [address] Phone: [phone] Fax: [phone]   Office Visit Note  Patient Name: Kaitlin [name]  Date of Birth:05/19/2055  Med Rec number [id number]  Date of Service: 05/06/2023  Augmentin [amoxicillin-pot clavulanate], Ipratropium, Levaquin [levofloxacin in d5w], Rocephin [ceftriaxone sodium in dextrose], Sertraline, Valtrex [valacyclovir hcl], and Zoloft [sertraline hcl]  No chief complaint on file.  HPI Patient is an 68 y.o. female who presents to clinic for complaints of PND and watery eyes since last Thursday. Has tried tylenol and an allergy mediations x 1. Mild cough. No other sick contacts.  Has an occasional temporal headache.  No fevers.  No recent antibiotics.   Current Medication:  Outpatient Encounter Medications as of 05/06/2023  Medication Sig   aspirin EC 81 MG tablet Take 81 mg by mouth daily.   blood glucose meter kit and supplies Dispense based on patient and insurance preference. Test once a day E11.9   busPIRone (BUSPAR) 5 MG tablet Take 1 tablet by mouth 2 (two) times daily.   Cholecalciferol (D 1000) 25 MCG (1000 UT) capsule Take 1,000 Units by mouth daily.   clonazePAM (KLONOPIN) 1 MG tablet Take 1 tablet (1 mg total) by mouth as needed.   enalapril (VASOTEC) 20 MG tablet Take 1 tablet (20 mg total) by mouth daily.   escitalopram (LEXAPRO) 10 MG tablet Take 1 tablet (10 mg total) by mouth daily.   eszopiclone (LUNESTA) 2 MG TABS tablet Take 2 mg by mouth at bedtime as needed. (Patient not taking: Reported on 04/19/2023)   furosemide (LASIX) 20 MG tablet Take 0.5 tablets (10 mg total) by mouth daily. (Patient not taking: Reported on 04/19/2023)   glucose blood (CONTOUR NEXT TEST) test strip TEST ONCE DAILY AS DIRECTED (Patient not taking: Reported on 04/19/2023)   levothyroxine (SYNTHROID) 50 MCG tablet Take 1 tablet (50 mcg total) by mouth daily.   metformin (FORTAMET) 500 MG (OSM) 24 hr tablet Take 500 mg by  mouth daily with breakfast.   pantoprazole (PROTONIX) 40 MG tablet Take 1 tablet (40 mg total) by mouth 2 (two) times daily before a meal. (Patient not taking: Reported on 04/19/2023)   rosuvastatin (CRESTOR) 10 MG tablet Take 1 tablet (10 mg total) by mouth daily.   verapamil (CALAN-SR) 240 MG CR tablet TAKE 1 TABLET (240 MG TOTAL) BY MOUTH EVERY MORNING. IN THE MORNING   No facility-administered encounter medications on file as of 05/06/2023.      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  Largest 1cm-hepatic flexure polyp->TA.       Vital Signs: There were no vitals taken for this visit.  ROS: As per HPI.  All other pertinent ROS negative.     Review of Systems  Constitutional:  Negative for diaphoresis, fatigue and fever.  HENT:  Positive for congestion, postnasal drip, rhinorrhea, sinus pressure, sinus pain and sore throat.  Eyes:  Positive for itching.  Respiratory:  Positive for cough.   Allergic/Immunologic: Positive for environmental allergies.  Neurological:  Positive for headaches.    Physical Exam Constitutional:      Appearance: Normal appearance.  HENT:     Right Ear: A middle ear effusion is present.     Left Ear: A middle ear effusion is present.     Nose: Congestion and rhinorrhea present.     Right Turbinates: Swollen.     Left Turbinates: Swollen.     Right Sinus: No maxillary sinus tenderness or frontal sinus tenderness.     Left Sinus: No maxillary sinus tenderness or  frontal sinus tenderness.     Mouth/Throat:     Mouth: Mucous membranes are moist.     Pharynx: Postnasal drip present.     Tonsils: 0 on the right. 0 on the left.  Eyes:     General: Lids are normal. Allergic shiner present.  Pulmonary:     Effort: Pulmonary effort is normal.     Breath sounds: Normal breath sounds.  Neurological:     Mental Status: She is alert.     No results found for this or any previous visit (from the past 24 hours).  Assessment/Plan: 1. Seasonal allergic rhinitis, unspecified trigger (Primary) Exam is consistent with allergic rhinitis.  We discussed Flonase 2 sprays each nostril once or twice a day, and antihistamine such as Claritin or Allegra once daily along with antihistamine Pataday eyedrops as needed for itchy watery eyes.  Discussed that all of these medications can dry you out and cause constipation so be mindful of this.  May use saline drops for her eyes should they feel dry or itchy.  Please let me know if your symptoms worsen or fail to improve with above OTC medications.  General Counseling: shelisa fern understanding of the findings of todays visit and agrees with plan of treatment. I have discussed any further diagnostic evaluation that may be needed or ordered today. We also reviewed her medications today. she has been encouraged to call the office with any questions or concerns that should arise related to todays visit.   No orders of the defined types were placed in this encounter.   No orders of the defined types were placed in this encounter.   I spent 20 minutes dedicated to the care of this patient (face-to-face and non-face-to-face) on the date of the encounter to include what is described in the assessment and plan.   Durenda Hurt, NP 05/06/2023 2:43 PM

## 2023-05-16 ENCOUNTER — Other Ambulatory Visit: Payer: Self-pay

## 2023-05-16 ENCOUNTER — Ambulatory Visit (HOSPITAL_COMMUNITY)
Admission: RE | Admit: 2023-05-16 | Discharge: 2023-05-16 | Disposition: A | Discharge status: Home / Self Care | Payer: BC Managed Care – PPO | Attending: Internal Medicine | Admitting: Internal Medicine

## 2023-05-16 ENCOUNTER — Encounter (HOSPITAL_COMMUNITY): Payer: Self-pay | Admitting: Internal Medicine

## 2023-05-16 ENCOUNTER — Ambulatory Visit (HOSPITAL_COMMUNITY): Admitting: Anesthesiology

## 2023-05-16 ENCOUNTER — Encounter (HOSPITAL_COMMUNITY)
Admission: RE | Disposition: A | Discharge status: Home / Self Care | Payer: Self-pay | Source: Home / Self Care | Attending: Internal Medicine

## 2023-05-16 DIAGNOSIS — Z6832 Body mass index (BMI) 32.0-32.9, adult: Secondary | ICD-10-CM | POA: Insufficient documentation

## 2023-05-16 DIAGNOSIS — E782 Mixed hyperlipidemia: Secondary | ICD-10-CM | POA: Diagnosis not present

## 2023-05-16 DIAGNOSIS — K219 Gastro-esophageal reflux disease without esophagitis: Secondary | ICD-10-CM | POA: Diagnosis not present

## 2023-05-16 DIAGNOSIS — E119 Type 2 diabetes mellitus without complications: Secondary | ICD-10-CM | POA: Insufficient documentation

## 2023-05-16 DIAGNOSIS — F419 Anxiety disorder, unspecified: Secondary | ICD-10-CM | POA: Insufficient documentation

## 2023-05-16 DIAGNOSIS — Z79899 Other long term (current) drug therapy: Secondary | ICD-10-CM | POA: Diagnosis not present

## 2023-05-16 DIAGNOSIS — I10 Essential (primary) hypertension: Secondary | ICD-10-CM | POA: Diagnosis not present

## 2023-05-16 DIAGNOSIS — D123 Benign neoplasm of transverse colon: Secondary | ICD-10-CM | POA: Diagnosis not present

## 2023-05-16 DIAGNOSIS — Z1211 Encounter for screening for malignant neoplasm of colon: Secondary | ICD-10-CM | POA: Diagnosis not present

## 2023-05-16 DIAGNOSIS — F32A Depression, unspecified: Secondary | ICD-10-CM | POA: Insufficient documentation

## 2023-05-16 DIAGNOSIS — E663 Overweight: Secondary | ICD-10-CM | POA: Diagnosis not present

## 2023-05-16 DIAGNOSIS — K449 Diaphragmatic hernia without obstruction or gangrene: Secondary | ICD-10-CM | POA: Insufficient documentation

## 2023-05-16 DIAGNOSIS — E039 Hypothyroidism, unspecified: Secondary | ICD-10-CM | POA: Diagnosis not present

## 2023-05-16 DIAGNOSIS — Z833 Family history of diabetes mellitus: Secondary | ICD-10-CM | POA: Insufficient documentation

## 2023-05-16 DIAGNOSIS — F418 Other specified anxiety disorders: Secondary | ICD-10-CM | POA: Insufficient documentation

## 2023-05-16 HISTORY — PX: COLONOSCOPY WITH PROPOFOL: SHX5780

## 2023-05-16 HISTORY — PX: POLYPECTOMY: SHX5525

## 2023-05-16 HISTORY — DX: Type 2 diabetes mellitus without complications: E11.9

## 2023-05-16 LAB — GLUCOSE, CAPILLARY: Glucose-Capillary: 107 mg/dL — ABNORMAL HIGH (ref 70–99)

## 2023-05-16 SURGERY — COLONOSCOPY WITH PROPOFOL
Anesthesia: General

## 2023-05-16 MED ORDER — PROPOFOL 500 MG/50ML IV EMUL
INTRAVENOUS | Status: DC | PRN
  Administered 2023-05-16: 150 ug/kg/min via INTRAVENOUS

## 2023-05-16 MED ORDER — LACTATED RINGERS IV SOLN: INTRAVENOUS | Status: DC | PRN

## 2023-05-16 MED ORDER — LIDOCAINE HCL (PF) 2 % IJ SOLN
INTRAMUSCULAR | Status: AC
  Filled 2023-05-16: qty 5

## 2023-05-16 MED ORDER — PROPOFOL 10 MG/ML IV BOLUS
INTRAVENOUS | Status: DC | PRN
  Administered 2023-05-16: 80 mg via INTRAVENOUS

## 2023-05-16 MED ORDER — LACTATED RINGERS IV SOLN: INTRAVENOUS | Status: DC

## 2023-05-16 MED ORDER — LIDOCAINE HCL (PF) 2 % IJ SOLN
INTRAMUSCULAR | Status: DC | PRN
  Administered 2023-05-16: 60 mg via INTRADERMAL

## 2023-05-16 NOTE — Op Note (Signed)
 [facility] Patient Name: Kaitlin [name] Procedure Date: 05/16/2023 9:44 AM MRN: [id number] Date of Birth: June 13, 1955 Attending MD: Gennette Pac , MD, [id number] CSN: [id number] Age: 68 Admit Type: Outpatient Procedure:                Colonoscopy Indications:              High risk colon cancer surveillance: Personal                            history of colonic polyps Providers:                Gennette Pac, MD, Crystal Page, Dyann Ruddle Referring MD:              Medicines:                Propofol per Anesthesia Complications:            No immediate complications. Estimated Blood Loss:     Estimated blood loss was minimal. Procedure:                Pre-Anesthesia Assessment:                           - Prior to the procedure, a History and Physical                            was performed, and patient medications and                            allergies were reviewed. The patient's tolerance of                            previous anesthesia was also reviewed. The risks                            and benefits of the procedure and the sedation                            options and risks were discussed with the patient.                            All questions were answered, and informed consent                            was obtained. Prior Anticoagulants: The patient has                            taken no anticoagulant or antiplatelet agents. ASA                            Grade Assessment: II - A patient with mild systemic                            disease. After reviewing the risks and benefits,  the patient was deemed in satisfactory condition to                            undergo the procedure.                           After obtaining informed consent, the colonoscope                            was passed under direct vision. Throughout the                            procedure, the patient's blood pressure, pulse, and                             oxygen saturations were monitored continuously. The                            [id number] scope was introduced through the                            anus and advanced to the the cecum, identified by                            appendiceal orifice and ileocecal valve. The                            colonoscopy was performed without difficulty. The                            patient tolerated the procedure well. The quality                            of the bowel preparation was good. The ileocecal                            valve, appendiceal orifice, and rectum were                            photographed. The quality of the bowel preparation                            was adequate. Scope In: 10:02:32 AM Scope Out: 10:15:26 AM Scope Withdrawal Time: 0 hours 7 minutes 57 seconds  Total Procedure Duration: 0 hours 12 minutes 54 seconds  Findings:      The perianal and digital rectal examinations were normal.      A 5 mm polyp was found in the splenic flexure. The polyp was sessile.       The polyp was removed with a cold snare. Resection and retrieval were       complete. Estimated blood loss was minimal.      The exam was otherwise without abnormality on direct and retroflexion       views. Impression:               -  One 5 mm polyp at the splenic flexure, removed                            with a cold snare. Resected and retrieved.                           - The examination was otherwise normal on direct                            and retroflexion views. Moderate Sedation:      Moderate (conscious) sedation was personally administered by an       anesthesia professional. The following parameters were monitored: oxygen       saturation, heart rate, blood pressure, respiratory rate, EKG, adequacy       of pulmonary ventilation, and response to care. Recommendation:           - Patient has a contact number available for                            emergencies.  The signs and symptoms of potential                            delayed complications were discussed with the                            patient. Return to normal activities tomorrow.                            Written discharge instructions were provided to the                            patient.                           - Advance diet as tolerated.                           - Continue present medications.                           - Repeat colonoscopy date to be determined after                            pending pathology results are reviewed for                            surveillance.                           - Return to GI office (date not yet determined). Procedure Code(s):        --- Professional ---                           [phone], Colonoscopy, flexible; with removal of  tumor(s), polyp(s), or other lesion(s) by snare                            technique Diagnosis Code(s):        --- Professional ---                           Z86.010, Personal history of colonic polyps                           D12.3, Benign neoplasm of transverse colon (hepatic                            flexure or splenic flexure) CPT copyright 2022 American Medical Association. All rights reserved. The codes documented in this report are preliminary and upon coder review may  be revised to meet current compliance requirements. Gerrit Friends. Avry Roedl, MD Gennette Pac, MD 05/16/2023 10:21:08 AM This report has been signed electronically. Number of Addenda: 0

## 2023-05-16 NOTE — Discharge Instructions (Signed)
  Colonoscopy Discharge Instructions  Read the instructions outlined below and refer to this sheet in the next few weeks. These discharge instructions provide you with general information on caring for yourself after you leave the hospital. Your doctor may also give you specific instructions. While your treatment has been planned according to the most current medical practices available, unavoidable complications occasionally occur. If you have any problems or questions after discharge, call Dr. Jena Gauss at [phone]. ACTIVITY You may resume your regular activity, but move at a slower pace for the next 24 hours.  Take frequent rest periods for the next 24 hours.  Walking will help get rid of the air and reduce the bloated feeling in your belly (abdomen).  No driving for 24 hours (because of the medicine (anesthesia) used during the test).   Do not sign any important legal documents or operate any machinery for 24 hours (because of the anesthesia used during the test).  NUTRITION Drink plenty of fluids.  You may resume your normal diet as instructed by your doctor.  Begin with a light meal and progress to your normal diet. Heavy or fried foods are harder to digest and may make you feel sick to your stomach (nauseated).  Avoid alcoholic beverages for 24 hours or as instructed.  MEDICATIONS You may resume your normal medications unless your doctor tells you otherwise.  WHAT YOU CAN EXPECT TODAY Some feelings of bloating in the abdomen.  Passage of more gas than usual.  Spotting of blood in your stool or on the toilet paper.  IF YOU HAD POLYPS REMOVED DURING THE COLONOSCOPY: No aspirin products for 7 days or as instructed.  No alcohol for 7 days or as instructed.  Eat a soft diet for the next 24 hours.  FINDING OUT THE RESULTS OF YOUR TEST Not all test results are available during your visit. If your test results are not back during the visit, make an appointment with your caregiver to find out the  results. Do not assume everything is normal if you have not heard from your caregiver or the medical facility. It is important for you to follow up on all of your test results.  SEEK IMMEDIATE MEDICAL ATTENTION IF: You have more than a spotting of blood in your stool.  Your belly is swollen (abdominal distention).  You are nauseated or vomiting.  You have a temperature over 101.  You have abdominal pain or discomfort that is severe or gets worse throughout the day.       Only 1 small polyp found today.  It was removed.      Further recommendations to follow pending review of pathology report

## 2023-05-16 NOTE — Anesthesia Preprocedure Evaluation (Signed)
 Anesthesia Evaluation  Patient identified by MRN, date of birth, ID band Patient awake    Reviewed: Allergy & Precautions, H&P , NPO status , Patient's Chart, lab work & pertinent test results, reviewed documented beta blocker date and time   Airway Mallampati: II  TM Distance: >3 FB Neck ROM: full    Dental no notable dental hx.    Pulmonary neg pulmonary ROS   Pulmonary exam normal breath sounds clear to auscultation       Cardiovascular Exercise Tolerance: Good hypertension,  Rhythm:regular Rate:Normal     Neuro/Psych  PSYCHIATRIC DISORDERS Anxiety Depression     Neuromuscular disease    GI/Hepatic Neg liver ROS, hiatal hernia,GERD  ,,  Endo/Other  diabetesHypothyroidism    Renal/GU Renal disease  negative genitourinary   Musculoskeletal   Abdominal   Peds  Hematology negative hematology ROS (+)   Anesthesia Other Findings   Reproductive/Obstetrics negative OB ROS                             Anesthesia Physical Anesthesia Plan  ASA: 2  Anesthesia Plan: General   Post-op Pain Management:    Induction:   PONV Risk Score and Plan: Propofol infusion  Airway Management Planned:   Additional Equipment:   Intra-op Plan:   Post-operative Plan:   Informed Consent: I have reviewed the patients History and Physical, chart, labs and discussed the procedure including the risks, benefits and alternatives for the proposed anesthesia with the patient or authorized representative who has indicated his/her understanding and acceptance.     Dental Advisory Given  Plan Discussed with: CRNA  Anesthesia Plan Comments:        Anesthesia Quick Evaluation

## 2023-05-16 NOTE — Transfer of Care (Addendum)
 Immediate Anesthesia Transfer of Care Note  Patient: Kaitlin [name]  Procedure(s) Performed: COLONOSCOPY WITH PROPOFOL POLYPECTOMY  Patient Location: Endoscopy Unit  Anesthesia Type:General  Level of Consciousness: drowsy and patient cooperative  Airway & Oxygen Therapy: Patient Spontanous Breathing and Patient connected to nasal cannula oxygen  Post-op Assessment: Report given to RN and Post -op Vital signs reviewed and stable  Post vital signs: Reviewed and stable  Last Vitals:  Vitals Value Taken Time  BP 110/70 05/16/23   1020  Temp 36.6 05/16/23   1020  Pulse 63 05/16/23   1020  Resp 19 05/16/23   1020  SpO2 86% 05/16/23   1020    Last Pain:  Vitals:   05/16/23 0957  TempSrc:   PainSc: 0-No pain      Patients Stated Pain Goal: 5 (05/16/23 0925)  Complications: Subsequent SpO2 95% RA

## 2023-05-16 NOTE — Interval H&P Note (Signed)
 History and Physical Interval Note:  05/16/2023 9:50 AM  Kaitlin [name]  has presented today for surgery, with the diagnosis of history of polyps.  The various methods of treatment have been discussed with the patient and family. After consideration of risks, benefits and other options for treatment, the patient has consented to  Procedure(s) with comments: COLONOSCOPY WITH PROPOFOL (N/A) - 9:45 am, asa 2 as a surgical intervention.  The patient's history has been reviewed, patient examined, no change in status, stable for surgery.  I have reviewed the patient's chart and labs.  Questions were answered to the patient's satisfaction.     Debroah Shuttleworth   no change here.  Surveillance colonoscopy today per plan.  The risks, benefits, limitations, alternatives and imponderables have been reviewed with the patient. Questions have been answered. All parties are agreeable.

## 2023-05-17 ENCOUNTER — Encounter (HOSPITAL_COMMUNITY): Payer: Self-pay | Admitting: Internal Medicine

## 2023-05-17 LAB — SURGICAL PATHOLOGY

## 2023-05-17 NOTE — Anesthesia Postprocedure Evaluation (Signed)
 Anesthesia Post Note  Patient: Kaitlin [name]  Procedure(s) Performed: COLONOSCOPY WITH PROPOFOL POLYPECTOMY  Patient location during evaluation: Phase II Anesthesia Type: General Level of consciousness: awake Pain management: pain level controlled Vital Signs Assessment: post-procedure vital signs reviewed and stable Respiratory status: spontaneous breathing and respiratory function stable Cardiovascular status: blood pressure returned to baseline and stable Postop Assessment: no headache and no apparent nausea or vomiting Anesthetic complications: no Comments: Late entry   No notable events documented.   Last Vitals:  Vitals:   05/16/23 1020 05/16/23 1024  BP: 110/70 105/69  Pulse: 63   Resp: 19   Temp: 36.6 C   SpO2: (!) 86%     Last Pain:  Vitals:   05/16/23 1024  TempSrc:   PainSc: 0-No pain                 Windell Norfolk

## 2023-05-19 ENCOUNTER — Encounter: Payer: Self-pay | Admitting: Internal Medicine

## 2023-07-23 ENCOUNTER — Other Ambulatory Visit (HOSPITAL_COMMUNITY): Payer: Self-pay | Admitting: Obstetrics & Gynecology

## 2023-07-23 DIAGNOSIS — Z1231 Encounter for screening mammogram for malignant neoplasm of breast: Secondary | ICD-10-CM

## 2023-08-22 ENCOUNTER — Encounter: Admitting: Obstetrics & Gynecology

## 2023-09-16 ENCOUNTER — Encounter: Payer: Self-pay | Admitting: Obstetrics & Gynecology

## 2023-09-16 ENCOUNTER — Other Ambulatory Visit (HOSPITAL_COMMUNITY)
Admission: RE | Admit: 2023-09-16 | Discharge: 2023-09-16 | Disposition: A | Discharge status: Home / Self Care | Source: Ambulatory Visit | Attending: Obstetrics & Gynecology | Admitting: Obstetrics & Gynecology

## 2023-09-16 ENCOUNTER — Ambulatory Visit (HOSPITAL_COMMUNITY)
Admission: RE | Admit: 2023-09-16 | Discharge: 2023-09-16 | Disposition: A | Discharge status: Home / Self Care | Source: Ambulatory Visit | Attending: Obstetrics & Gynecology | Admitting: Obstetrics & Gynecology

## 2023-09-16 ENCOUNTER — Ambulatory Visit: Admitting: Obstetrics & Gynecology

## 2023-09-16 VITALS — BP 116/72 | HR 80 | Ht 65.0 in | Wt 191.0 lb

## 2023-09-16 DIAGNOSIS — N3941 Urge incontinence: Secondary | ICD-10-CM | POA: Diagnosis not present

## 2023-09-16 DIAGNOSIS — Z1231 Encounter for screening mammogram for malignant neoplasm of breast: Secondary | ICD-10-CM | POA: Diagnosis present

## 2023-09-16 DIAGNOSIS — N3281 Overactive bladder: Secondary | ICD-10-CM

## 2023-09-16 DIAGNOSIS — Z01419 Encounter for gynecological examination (general) (routine) without abnormal findings: Secondary | ICD-10-CM

## 2023-09-16 MED ORDER — MIRABEGRON ER 50 MG PO TB24: 50.0000 mg | ORAL_TABLET | Freq: Every day | ORAL | 11 refills | Status: AC

## 2023-09-16 NOTE — Progress Notes (Signed)
 Subjective:     Kaitlin [name] is a 68 y.o. female here for a routine exam.  No LMP recorded. Patient is postmenopausal. G1P1 Birth Control Method:  postmenoapusal Menstrual Calendar(currently): none  Current complaints: urge incontinence.   Current acute medical issues:  diabetes   Recent Gynecologic History No LMP recorded. Patient is postmenopausal. Last Pap: 2022,  normal Last mammogram: 09/2023,  normal  Past Medical History:  Diagnosis Date   Anxiety    Colon polyp    April 2008   Depression    Diabetes mellitus without complication (HCC)    Essential hypertension    Folliculitis 09/26/2012   Helicobacter pylori gastritis    AUG 2008 PREVPAC: nausea-->HELIDAC: GI UPSET   Hypothyroidism    Irritable bowel syndrome    Kidney stones    Mixed hyperlipidemia    Mixed stress and urge urinary incontinence 09/26/2012   Obesity    Panic attacks    Reflux    Tubulovillous adenoma 08/07/2010   Colonoscopy w/ Dr Harvey w/ 7 polyps removed-bx showed tubular adenomas.  Largest 1cm-hepatic flexure polyp->TA.      Past Surgical History:  Procedure Laterality Date   CHOLECYSTECTOMY     COLONOSCOPY  APR 2008   AC/[place] SIMPLE ADENOMA   COLONOSCOPY  08/07/2010   Dr. Parks adenomas and tubulovillous adenoma; needs surveillance 2015   COLONOSCOPY N/A 09/18/2013   Dr.Rourk- normal rectum, 2 diminutive polys in the mid sigmoid segment o/w the remainder of the colonic mucosa appeared normal.hyperplastic poylps   COLONOSCOPY WITH PROPOFOL  N/A 02/23/2019   Procedure: COLONOSCOPY WITH PROPOFOL ;  Surgeon: Shaaron Lamar HERO, MD;  Location: [facility];  Service: Endoscopy;  Laterality: N/A;  2:45pm - office unable to reach pt to move her up   COLONOSCOPY WITH PROPOFOL  N/A 05/16/2023   Procedure: COLONOSCOPY WITH PROPOFOL ;  Surgeon: Shaaron Lamar HERO, MD;  Location: [facility];  Service: Endoscopy;  Laterality: N/A;  9:45 am, asa 2   ESOPHAGOGASTRODUODENOSCOPY N/A 11/29/2014   RMR:  Gastric erosions. Small hiatal hernia. Status post biopsy.    MASS EXCISION  02/28/2011   Procedure: EXCISION MASS;  Surgeon: Thresa JAYSON Pulling, MD;  Location: [facility];  Service: General;  Laterality: Right;  soft tissue mass   POLYPECTOMY  02/23/2019   Procedure: POLYPECTOMY;  Surgeon: Shaaron Lamar HERO, MD;  Location: [facility];  Service: Endoscopy;;   POLYPECTOMY  05/16/2023   Procedure: POLYPECTOMY;  Surgeon: Shaaron Lamar HERO, MD;  Location: [facility];  Service: Endoscopy;;   UPPER GASTROINTESTINAL ENDOSCOPY  AUG 2008 PAIN/NAUSEA   H. PYLORI treated    OB History     Gravida  1   Para  1   Term      Preterm      AB      Living  1      SAB      IAB      Ectopic      Multiple      Live Births  1           Social History   Socioeconomic History   Marital status: Married    Spouse name: Not on file   Number of children: 1   Years of education: Not on file   Highest education level: 12th grade  Occupational History   Not on file  Tobacco Use   Smoking status: Never   Smokeless tobacco: Never   Tobacco comments:    Never Smoked  Vaping Use   Vaping status: Never Used  Substance and Sexual Activity   Alcohol use: Not Currently   Drug use: No   Sexual activity: Not Currently    Birth control/protection: Post-menopausal  Other Topics Concern   Not on file  Social History Narrative   Not on file   Social Drivers of Health   Financial Resource Strain: Low Risk  (05/15/2023)   Received [facility]   Overall Financial Resource Strain (CARDIA)    Difficulty of Paying Living Expenses: Not hard at all  Food Insecurity: No Food Insecurity (05/15/2023)   Received from [facility]   Hunger Vital Sign    Within the past 12 months, you worried that your food would run out before you got the money to buy more.: Never true    Within the past 12 months, the food you bought just didn't last and you didn't have money to  get more.: Never true  Transportation Needs: No Transportation Needs (05/15/2023)   Received from [facility] - Transportation    In the past 12 months, has lack of transportation kept you from medical appointments or from getting medications?: No    Lack of Transportation (Non-Medical): No  Physical Activity: Inactive (06/07/2020)   Exercise Vital Sign    Days of Exercise per Week: 0 days    Minutes of Exercise per Session: 0 min  Stress: Stress Concern Present (06/07/2020)   [employer] - Occupational Stress Questionnaire    Feeling of Stress : Rather much  Social Connections: Unknown (06/07/2020)   Social Connection and Isolation Panel    Frequency of Communication with Friends and Family: Three times a week    Frequency of Social Gatherings with Friends and Family: Never    Attends Religious Services: Patient declined    Database administrator or Organizations: Yes    Attends Banker Meetings: Never    Marital Status: Married    Family History  Problem Relation Age of Onset   Coronary artery disease Mother    Hypertension Mother    Heart attack Mother    Heart disease Mother    Suicidality Mother    Diabetes Sister    Diabetes Sister    Coronary artery disease Brother        Age 8   Heart attack Brother    Heart disease Brother    Colon cancer Neg Hx    Anesthesia problems Neg Hx    Hypotension Neg Hx    Malignant hyperthermia Neg Hx    Pseudochol deficiency Neg Hx    Colon polyps Neg Hx      Current Outpatient Medications:    aspirin EC 81 MG tablet, Take 81 mg by mouth daily., Disp: , Rfl:    blood glucose meter kit and supplies, Dispense based on patient and insurance preference. Test once a day E11.9, Disp: 1 each, Rfl: 0   busPIRone (BUSPAR) 5 MG tablet, Take 1 tablet by mouth 2 (two) times daily., Disp: , Rfl:    Cholecalciferol (D 1000) 25 MCG (1000 UT) capsule, Take 1,000 Units by mouth  daily., Disp: , Rfl:    clonazePAM  (KLONOPIN ) 1 MG tablet, Take 1 tablet (1 mg total) by mouth as needed., Disp: 30 tablet, Rfl: 0   enalapril  (VASOTEC ) 20 MG tablet, Take 1 tablet (20 mg total) by mouth daily., Disp: 90 tablet, Rfl: 1   escitalopram  (  LEXAPRO ) 10 MG tablet, Take 1 tablet (10 mg total) by mouth daily., Disp: 30 tablet, Rfl: 5   glucose blood (CONTOUR NEXT TEST) test strip, TEST ONCE DAILY AS DIRECTED, Disp: 100 strip, Rfl: 0   levothyroxine  (SYNTHROID ) 50 MCG tablet, Take 1 tablet (50 mcg total) by mouth daily., Disp: 90 tablet, Rfl: 1   metformin (FORTAMET) 500 MG (OSM) 24 hr tablet, Take 500 mg by mouth daily with breakfast., Disp: , Rfl:    mirabegron  ER (MYRBETRIQ ) 50 MG TB24 tablet, Take 1 tablet (50 mg total) by mouth daily. At bedtime, Disp: 30 tablet, Rfl: 11   pantoprazole  (PROTONIX ) 40 MG tablet, Take 1 tablet (40 mg total) by mouth 2 (two) times daily before a meal., Disp: 180 tablet, Rfl: 3   rosuvastatin  (CRESTOR ) 10 MG tablet, Take 1 tablet (10 mg total) by mouth daily., Disp: 90 tablet, Rfl: 1   verapamil  (CALAN -SR) 240 MG CR tablet, TAKE 1 TABLET (240 MG TOTAL) BY MOUTH EVERY MORNING. IN THE MORNING, Disp: 30 tablet, Rfl: 0  Review of Systems  Review of Systems  Constitutional: Negative for fever, chills, weight loss, malaise/fatigue and diaphoresis.  HENT: Negative for hearing loss, ear pain, nosebleeds, congestion, sore throat, neck pain, tinnitus and ear discharge.   Eyes: Negative for blurred vision, double vision, photophobia, pain, discharge and redness.  Respiratory: Negative for cough, hemoptysis, sputum production, shortness of breath, wheezing and stridor.   Cardiovascular: Negative for chest pain, palpitations, orthopnea, claudication, leg swelling and PND.  Gastrointestinal: negative for abdominal pain. Negative for heartburn, nausea, vomiting, diarrhea, constipation, blood in stool and melena.  Genitourinary: Negative for dysuria, urgency, frequency,  hematuria and flank pain.  Musculoskeletal: Negative for myalgias, back pain, joint pain and falls.  Skin: Negative for itching and rash.  Neurological: Negative for dizziness, tingling, tremors, sensory change, speech change, focal weakness, seizures, loss of consciousness, weakness and headaches.  Endo/Heme/Allergies: Negative for environmental allergies and polydipsia. Does not bruise/bleed easily.  Psychiatric/Behavioral: Negative for depression, suicidal ideas, hallucinations, memory loss and substance abuse. The patient is not nervous/anxious and does not have insomnia.        Objective:  Blood pressure 116/72, pulse 80, height 5' 5 (1.651 m), weight 191 lb (86.6 kg).   Physical Exam  Vitals reviewed. Constitutional: She is oriented to person, place, and time. She appears well-developed and well-nourished.  HENT:  Head: Normocephalic and atraumatic.        Right Ear: External ear normal.  Left Ear: External ear normal.  Nose: Nose normal.  Mouth/Throat: Oropharynx is clear and moist.  Eyes: Conjunctivae and EOM are normal. Pupils are equal, round, and reactive to light. Right eye exhibits no discharge. Left eye exhibits no discharge. No scleral icterus.  Neck: Normal range of motion. Neck supple. No tracheal deviation present. No thyromegaly present.  Cardiovascular: Normal rate, regular rhythm, normal heart sounds and intact distal pulses.  Exam reveals no gallop and no friction rub.   No murmur heard. Respiratory: Effort normal and breath sounds normal. No respiratory distress. She has no wheezes. She has no rales. She exhibits no tenderness.  GI: Soft. Bowel sounds are normal. She exhibits no distension and no mass. There is no tenderness. There is no rebound and no guarding.  Genitourinary:  Breasts no masses skin changes or nipple changes bilaterally      Vulva is normal without lesions Vagina is pink moist without discharge Cervix normal in appearance and pap is  done Uterus is normal size  shape and contour Adnexa is negative with normal sized ovaries   Musculoskeletal: Normal range of motion. She exhibits no edema and no tenderness.  Neurological: She is alert and oriented to person, place, and time. She has normal reflexes. She displays normal reflexes. No cranial nerve deficit. She exhibits normal muscle tone. Coordination normal.  Skin: Skin is warm and dry. No rash noted. No erythema. No pallor.  Psychiatric: She has a normal mood and affect. Her behavior is normal. Judgment and thought content normal.       Medications Ordered at today's visit: Meds ordered this encounter  Medications   mirabegron  ER (MYRBETRIQ ) 50 MG TB24 tablet    Sig: Take 1 tablet (50 mg total) by mouth daily. At bedtime    Dispense:  30 tablet    Refill:  11    Other orders placed at today's visit: No orders of the defined types were placed in this encounter.    ASSESSMENT + PLAN:    ICD-10-CM   1. Well woman exam with routine gynecological exam  Z01.419     2. Encounter for gynecological examination with Papanicolaou smear of cervix  Z01.419 Cytology - PAP( [facility])    3. OAB (overactive bladder): Gemtasa 75 mg samples given(did not have myrbetriq ) but her insurance favors myrbetriq , prescribed 50 mg  N32.81     4. Urge incontinence  N39.41           Return in about 3 months (around 12/17/2023) for Follow up, with Dr Jayne.

## 2023-09-19 LAB — CYTOLOGY - PAP
Comment: NEGATIVE
Diagnosis: NEGATIVE
High risk HPV: NEGATIVE

## 2023-11-27 ENCOUNTER — Ambulatory Visit (INDEPENDENT_AMBULATORY_CARE_PROVIDER_SITE_OTHER): Payer: Self-pay | Admitting: Medical

## 2023-11-27 ENCOUNTER — Encounter: Payer: Self-pay | Admitting: Medical

## 2023-11-27 ENCOUNTER — Other Ambulatory Visit: Payer: Self-pay

## 2023-11-27 VITALS — BP 130/80 | HR 73 | Temp 97.2°F | Ht 65.0 in | Wt 190.0 lb

## 2023-11-27 DIAGNOSIS — R051 Acute cough: Secondary | ICD-10-CM

## 2023-11-27 DIAGNOSIS — J019 Acute sinusitis, unspecified: Secondary | ICD-10-CM

## 2023-11-27 MED ORDER — DOXYCYCLINE HYCLATE 100 MG PO CAPS: 100.0000 mg | ORAL_CAPSULE | Freq: Two times a day (BID) | ORAL | 0 refills | Status: AC

## 2023-11-27 MED ORDER — PROMETHAZINE-DM 6.25-15 MG/5ML PO SYRP: 5.0000 mL | ORAL_SOLUTION | Freq: Four times a day (QID) | ORAL | 0 refills | Status: AC | PRN

## 2023-11-27 NOTE — Patient Instructions (Addendum)
-  Take complete course of antibiotics as prescribed.  Take with food.   -Try the prescription cough syrup at bedtime to help relieve her cough.  It may cause drowsiness. -You may use a cough suppressant like Delsym or Robitussin during the day as needed for your cough. -You may continue Tylenol  as needed for pain. -Try Flonase /Fluticasone  nasal spray, 2 sprays to each nostril once a day. - You may use saline nasal spray as needed to help clear mucus from your nose. -Rest and stay well hydrated (by drinking water  and other liquids).  -For sore throat/cough, use cough drops/throat lozenges, gargle warm salt water  and/or drink warm liquids (like tea with honey). -Call, send message to provider or schedule follow-up visit as needed for new/worsening symptoms (i.e. fever, shortness of breath, chest pain) or if symptoms are not improving over the next 2 to 3 days with recommended/prescribed treatment.

## 2023-11-27 NOTE — Progress Notes (Signed)
 [facility] [address] [place], [place] [address]   Office Visit Note  Patient Name: Kaitlin [name] Date of Birth [id number]  Medical Record number [id number]  Date of Service: 11/27/2023  Chief Complaint  Patient presents with   Sick visit    Patient c/o cough with gray mucus production and lower back pain. Denies fever but states she had chills and some mild ear pain when her symptoms initially started. Symptoms began 8 days ago and worsened over time. She has been taking Tylenol .     HPI 68 y.o. female presents with respiratory symptoms x 8 days.  Noted runny nose, cough and chills beginning 8 days ago. 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 Cough was productive of clear, then green/brown and now gray mucus. No sore throat but some swollen glands in neck. Cough woke her up this AM and has been a problem when more active at work. No fever she knows of, no loss of appetite. Nasal congestion/runny nose some improved. Mild frontal HA at times. Voice remains hoarse. Felt a little out of breath this AM when she woke up, better after taking anxiety med (Clonazepam ). Not otherwise SOB. No chest pain. No nausea, vomiting or diarrhea. Occasional body ache.   Blood sugars slightly low last couple days in 70s, responsive to drinking some orange juice.  Taking Tylenol  when needed.  Was treated with Zpack 10/16/23 for respiratory illness.  PMH significant for type 2 DM, HTN, hypothyroidism and anxiety. Denies hx of asthma or COPD. Has never been a smoker.   Current Medication:  Outpatient Encounter Medications as of 11/27/2023  Medication Sig   aspirin EC 81 MG tablet Take 81 mg by mouth daily.   blood glucose meter kit and supplies Dispense based on patient and insurance preference. Test once a day E11.9   busPIRone (BUSPAR) 5 MG tablet Take 1 tablet by mouth 2 (two) times daily. (Patient taking differently: Take 1 tablet by  mouth 2 (two) times daily as needed.)   Cholecalciferol (D 1000) 25 MCG (1000 UT) capsule Take 1,000 Units by mouth daily. (Patient taking differently: Take 1,000 Units by mouth once a week.)   clonazePAM  (KLONOPIN ) 1 MG tablet Take 1 tablet (1 mg total) by mouth as needed.   enalapril  (VASOTEC ) 20 MG tablet Take 1 tablet (20 mg total) by mouth daily.   escitalopram  (LEXAPRO ) 10 MG tablet Take 1 tablet (10 mg total) by mouth daily.   glucose blood (CONTOUR NEXT TEST) test strip TEST ONCE DAILY AS DIRECTED   levothyroxine  (SYNTHROID ) 50 MCG tablet Take 1 tablet (50 mcg total) by mouth daily.   metformin (FORTAMET) 500 MG (OSM) 24 hr tablet Take 500 mg by mouth daily with breakfast. (Patient taking differently: Take 500 mg by mouth 2 (two) times daily with a meal.)   mirabegron  ER (MYRBETRIQ ) 50 MG TB24 tablet Take 1 tablet (50 mg total) by mouth daily. At bedtime (Patient taking differently: Take 50 mg by mouth as needed. At bedtime)   pantoprazole  (PROTONIX ) 40 MG tablet Take 1 tablet (40 mg total) by mouth 2 (two) times daily before a meal. (Patient taking differently: Take 40 mg by mouth 2 (two) times daily as needed.)   rosuvastatin  (CRESTOR ) 10 MG tablet Take 1 tablet (10 mg total) by mouth daily.   verapamil  (CALAN -SR) 240 MG CR tablet TAKE 1 TABLET (240 MG TOTAL) BY MOUTH EVERY MORNING. IN THE MORNING  No facility-administered encounter medications on file as of 11/27/2023.      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  Largest 1cm-hepatic flexure polyp->TA.       Vital Signs: BP 130/80   Pulse 73   Temp (!) 97.2 F (36.2 C)   Ht 5' 5 (1.651 m)   Wt 190 lb (86.2 kg)   SpO2 98%   BMI 31.62 kg/m    Review of Systems  Constitutional:  Positive for chills (near onset of illness, not in last few days) and fatigue. Negative for fever.  HENT:  Positive for congestion, ear pain (occasional), sinus pressure, sore throat and voice change (hoarse). Negative for ear discharge, rhinorrhea and trouble swallowing.   Respiratory:  Positive for cough. Negative for chest tightness and shortness of breath.   Cardiovascular:  Negative for chest pain.  Gastrointestinal:  Negative for diarrhea, nausea and vomiting.  Musculoskeletal:  Positive for myalgias. Negative for neck stiffness.  Neurological:  Positive for headaches.    Physical Exam Vitals reviewed.  Constitutional:      General: She is not in acute distress.    Comments: Tired, mildly unwell appearing  HENT:     Head: Normocephalic.     Right Ear: Ear canal and external ear normal. A middle ear effusion (mild serous) is present. Tympanic membrane is not injected, perforated, erythematous or bulging.     Left Ear: Ear canal and external ear normal. A middle ear effusion (moderate cloudy fluid) is present. Tympanic membrane is bulging (slightly). Tympanic membrane is not injected, perforated or erythematous.     Ears:     Comments: Right TM not as well visualized due to cerumen in EAC    Nose: Mucosal edema, congestion and rhinorrhea (cloudy) present.     Right Turbinates: Swollen.     Left Turbinates: Swollen.     Comments: Slightly tender to maxillary and frontal sinuses    Mouth/Throat:     Mouth: Mucous membranes are moist. No oral lesions.     Pharynx: No pharyngeal swelling or posterior oropharyngeal erythema.     Tonsils: No tonsillar exudate.  Cardiovascular:      Rate and Rhythm: Normal rate and regular rhythm.     Heart sounds: No murmur heard.    No friction rub. No gallop.  Pulmonary:     Effort: Pulmonary effort is normal.     Breath sounds: Normal breath sounds. No wheezing, rhonchi or rales.     Comments: Frequent wet-sounding cough, productive of thick green phlegm. Musculoskeletal:     Cervical back: Neck supple. No rigidity.  Lymphadenopathy:     Cervical: Cervical adenopathy (small anterior nodes) present.  Neurological:     Mental Status: She is alert.      Assessment/Plan: 1. Acute non-recurrent sinusitis, unspecified location (Primary) 2. Acute cough Some concern based on clinical  findings for secondary bacterial infection (likely sinusitis/early otitis media). Lungs clear to auscultation, no significant SOB. Will treat with Doxycycline  given allergy/intolerance of Augmentin. Will trial promethazine -DM cough syrup, recommended this for nighttime given it may cause drowsiness. May take Robitussin or Delsym during day for cough as needed. Discussed nasal saline spray as needed. May use Flonase , 2 sprays to each nostril once daily until ear/sinus congestion resolve. Patient encouraged to call, send message to provider or schedule follow-up visit as needed for new/worsening symptoms (i.e. fever, shortness of breath, chest pain) or if symptoms are not improving over the next 2 to 3 days with recommended/prescribed treatment.  Patient declined sick note for work.  - doxycycline  (VIBRAMYCIN ) 100 MG capsule; Take 1 capsule (100 mg total) by mouth 2 (two) times daily for 10 days.  Dispense: 20 capsule; Refill: 0 - promethazine -dextromethorphan (PROMETHAZINE -DM) 6.25-15 MG/5ML syrup; Take 5 mLs by mouth 4 (four) times daily as needed for cough.  Dispense: 118 mL; Refill: 0  Patient Instructions  -Take complete course of antibiotics as prescribed.  Take with food.   -Try the prescription cough syrup at bedtime to help relieve her cough.  It may  cause drowsiness. -You may use a cough suppressant like Delsym or Robitussin during the day as needed for your cough. -You may continue Tylenol  as needed for pain. -Try Flonase /Fluticasone  nasal spray, 2 sprays to each nostril once a day. - You may use saline nasal spray as needed to help clear mucus from your nose. -Rest and stay well hydrated (by drinking water  and other liquids).  -For sore throat/cough, use cough drops/throat lozenges, gargle warm salt water  and/or drink warm liquids (like tea with honey). -Call, send message to provider or schedule follow-up visit as needed for new/worsening symptoms (i.e. fever, shortness of breath, chest pain) or if symptoms are not improving over the next 2 to 3 days with recommended/prescribed treatment.        General Counseling: baya lentz understanding of the findings of todays visit and plan of treatment. she has been encouraged to call the office with any questions or concerns that should arise related to todays visit.    Time spent:20 Minutes    Joen Arts PA-C Physician Assistant

## 2023-12-31 NOTE — Progress Notes (Signed)
 HISTORY OF PRESENT ILLNESS:   Kaitlin [name] is a 68 y.o. female that is presenting to clinic for their follow up of DX:   Encounter Diagnoses  Name Primary?  . Posterior tibial tendonitis, left Yes  . 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  She endorses using fairly supportive shoes, Brooks.  Prior to the medial foot pain occurring she did note that her last pair of Burnetta were starting to wear out and she began developing pain to the left foot gradually.  She did obtain a new pair of Brooks same model which seem to be assisting the foot pain, however was residual pain is still present.  Denies trauma.    At prior appointment at our offices the following treatment was recommended / prescribed to the patient:    *Of note: Discussed with the patient my usual conservative treatment escalation for this condition, include cam boot and strict usage for 2 to 3 weeks.  Patient seemed resistant to this option and would like to have de-escalated treatment to figure 8 ankle brace and NSAID.  I advised the patient that this may prolong her treatment, and she still may need to go into the boot at next appointment should her symptoms not improve.  She is more amenable to this solution.  I demonstrated how to strap the brace, and advised that if she is having increased or continued pain within the next week she is to call our offices.  Patient is aware and agreeable.   - Activity: awactivity: WBAT  - Immobilization / Support: Supportive shoe + ASO - PO Medication: NSAID only  - Injection at last visit: no ; Any improvement noted: no  - Topical Medication: Voltaren   Has Patient Been Compliant with Recommendations?:  NO ; patient reports that her brace felt better post appointment, last appointment when provider applied the brace.  However when she got home and walked around the  brace became a little tight and upon removal that night and reapplication in the morning, the brace began to hurt she says that the brace hurt with her application and ended up removing the brace, and has not been wearing it since.  Her foot condition has progressed, now she feels that the ipsilateral knee hurts as well, and contralateral foot is beginning to hurt.  She continues to wear Burnetta.  Overall, the patient perceives their condition course has: WORSENING   No interval health changes.  No other acute pedal complaints at this time.  Of note: Patient is diabetic, her diagnosis stems back 3 to 4 years.  She is fairly well-controlled last A1c 3 months ago at 6.6.  She checks her blood sugars every other day, and sugars are controlled with metformin.  She has previously had steroid taper, and managed well.  Past Medical History:  Diagnosis Date  . Depression   . Hyperlipidemia   . Hypertension   . Thyroid  disease      Your Medication List       * Accurate as of January 01, 2024  9:51 AM. Always use your most recent med list.              Instructions 8 am 12 pm 5 pm 8 pm Notes   aspirin 81 MG chewable tablet  Take 81 mg by mouth once daily  busPIRone 5 MG tablet Commonly known as: BUSPAR  Take 1 tablet (5 mg total) by mouth 2 (two) times daily              carBAMazepine 100 MG XR tablet Commonly known as: TEGretol XR  Take 1 tablet (100 mg total) by mouth 2 (two) times daily              clonazePAM  1 MG tablet Commonly known as: KlonoPIN   Take 0.5 tablets (0.5 mg total) by mouth at bedtime as needed for Anxiety              CONTOUR NEXT TEST STRIPS test strip Generic drug: blood glucose diagnostic  TEST ONCE DAILY AS DIRECTED              diclofenac  1 % topical gel Commonly known as: VOLTAREN   Apply 2 g topically 3 (three) times daily              enalapril  20 MG tablet Commonly known as: VASOTEC   TAKE 1 TABLET BY  MOUTH EVERYDAY AT BEDTIME              ergocalciferol  (vitamin D2) 1,250 mcg (50,000 unit) capsule  Take 1 capsule (50,000 Units total) by mouth once a week              escitalopram  oxalate 20 MG tablet Commonly known as: LEXAPRO   TAKE 1 TABLET BY MOUTH EVERY DAY              levothyroxine  50 MCG tablet Commonly known as: SYNTHROID   TAKE 1 TAB BY MOUTH DAILY ON EMPTY STOMACH WITH A GLASS OF WATER  30-60 MINUTES BEFORE BREAKFAST.              lovastatin  40 MG tablet Commonly known as: MEVACOR   Take 40 mg by mouth at bedtime              meloxicam  15 MG tablet Commonly known as: MOBIC   Take 1 tablet (15 mg total) by mouth once daily for 30 days              metFORMIN 500 MG XR tablet Commonly known as: GLUCOPHAGE-XR  TAKE 1 TABLET BY MOUTH TWICE A DAY             * methylPREDNISolone 4 mg tablet Commonly known as: MEDROL DOSEPACK Started by: Greig Marry Blush, DPM  Follow package directions. AFTER completion of the steroid dose pack (I.e. day where there are zero pills remaining in the packet), you can begin taking the once a day NSAID (anti-inflammatory) if that was prescribed by your doctor. DO NOT take both the steroid and the anti-inflammatory medication (I.e. both prescriptions) at the same time.              mirabegron  50 mg ER tablet Commonly known as: MYRBETRIQ   Take 50 mg by mouth at bedtime              rosuvastatin  10 MG tablet Commonly known as: CRESTOR   TAKE 1 TABLET BY MOUTH EVERY DAY              verapamiL  240 MG SR tablet Commonly known as: CALAN -SR  TAKE 1 TABLET BY MOUTH AT BEDTIME.                 Allergies  Allergen Reactions  . Valacyclovir Hcl Hives  . Amoxicillin -Pot Clavulanate Unknown  . Aspirin Unknown  . Ceftriaxone  Sodium Hives    blisters  .  Ipratropium Unknown    Causes blood pressure to go up  . Sertraline Other (See Comments)  . Ceftriaxone  Rash    Blisters   No past  surgical history on file.   PHYSICAL EXAMINATION:  BP 139/72   Ht 165.1 cm (5' 5)   Wt 87.1 kg (192 lb)   BMI 31.95 kg/m   Constitutional: Patient appears of normal development and good nutritional status for stated age, and well-groomed. Phonating appropriately.  Respiratory: Normal RR = 14 Psychiatric: Alert and oriented to time, place, person and situation. The patient is noted to have good judgment and insight into the reason for today's visit and treatments.   FOCUSED LOWER EXTREMITY EXAMINATION:   Neurological:  Gross protective sensation intact.  No focal motor or sensory deficits identified.  No Tinel's or Valliex's sign.   Vascular:  Dorsalis Pedis artery on palpation:  Posterior Tibial artery on palpation:  Capillary Filling Times: to all digits  Peripheral edema: LOCALIZED to medial foot at navicular tuberosity  Dependency Rubor: -  Varicosities: - Pedal Hair: decreasde  Dermatological:  Skin quality: WNL - normal temperature texture turgor No ulcerations, open wounds, or pre-ulcerative lesions noted.  Interdigital spaces: c/d/i   Musculoskeletal:  Muscle strength: 5/5 in all 4 quadrants.  Difficulty with PF/Inversion against resistance.  Ankle Joint ROM: Decreased, equinus noted bilaterally. STJ ROM: Decreased no pain with range of motion. 1st MPJ ROM: wnl   [++ ] TTP along PT course -persistent 01/01/2024 [+++ ] TTP to navicular insertion -persistent 01/01/2024 [+ ] TTP to plantar arch -persistent 01/01/2024 [- ] TTP to retro-medial malleolar area  Following tendons were palpated and found to be intact/ able to activate/ and without TTP: Achilles tendon, peroneal tendons, AT, EHL/FHL, EDB/FDB   ---WEIGHT BEARING EXAM---  Overall Foot Structure: Pes Planus on WB  [-] Valgus appearance of ankle  [- ] Able to conduct single heel rise -she is able to minimally conduct this on the right side, has a tens and inability to conduct on left [+ ] Collapse of  arch on WB  [+ ] Pronation of midfoot noted  [+ ] 'Too many toes' sign - i.e. FF ABDuction  ASSESSMENT AND PLAN I have determined the complexity of the problem and Condition(s), etiologies, and options for care, treatment plan, and prognosis were reviewed with the patient. Upon discussion of above, treatment plan was devised and agreed upon by provider and patient. All questions were answered in detail.   Encounter Diagnoses  Name Primary?  . Posterior tibial tendonitis, left Yes  . Left foot pain      12/11/2023 Radiographs: 3 Views taken and reviewed of the LEFT FOOT: AP, Lat, Oblique  Soft Tissue Density: WNL  Bone Density: WNL  Fracture: None. No discrete/ focal fracture or dislocation noted.   [++] Decreased calcaneal inclination angle.  Osseous spurring noted of the plantar and posterior calcaneus [+] Anterior break of CYMA line.  [+] CORA / sag noted at TNJ on lateral view.  [-] Talar head uncoverage:  [-] Arthritic changes, noted to:   Impression: Appearance of overall architecture of foot consistent with pes planus. No acute fx/ dislocation.   Conservative treatment options discussed with patient, including but not limited to: ORTHOTICS/ SHOE GEAR / STRETCHING / BRACING/ MEDICATIONS / PHYSICAL THERAPY   - Discussed usual escalation of treatment for this acute issue, primarily the conservative modalities able to employ including: immobilization versus supportive shoe gear/orthotics/bracing, contrast baths, icing, PO medication (NSAID vs. steroid), Physical  therapy, and last resort of surgical intervention.    ----SUPPORT/PROTECTION---  - WBAT in CAM boot x 2-3 weeks ; STRICT - in house/ out house use.   Plans to transition to supportive ankle brace at next visit if symptoms permit (advised on where patient can purchase brace OTC). May consider transition to brace with PT support.  - Discussed the contralateral &/or proximal limb symptoms that can arise from LLD 2/2 boot.  Recommended OTC purchase of even-up / level up. Pt verbalized understanding of recommendations and sequela of prolonged or inappropriate boot use.   A  CAM  (non-pneumatic) was dispensed to patient in the office for assistance in treatment plan of the above noted diagnosis. I hereby certify that the appliance described above is, in my opinion, both reasonable and necessary in reference to accepted standards of medical practice in the treatment of the patient condition and rehabilitation. The patient was offered the supplier standards and warranty information. The proper use, wear and maintenance was discussed and/or demonstrated in office with the patient. Patient verbalized understanding of the medical necessity of such a device for current condition.   - Educated on importance of proper / supportive shoe gear. Demonstrated / discussed shoe stability test and relationship to pressure distribution in the foot.    **Patient was placed in a boot, she confirmed that it did hurt when she had the application of the boot.  Additional foam padding was placed in the boot in the area of pain, she relayed that this felt more comfortable and did not hurt her anymore.  She confirmed that the boot felt comfortable.  MA relays that she reported 'I should have done this in the first place' -and seemed comfortable with instruction and wearing the boot consistently.  On ambulation out of the office, we did not note a significant antalgic gait with the boot.  01/01/2024  ----MEDICATION----  - Rx Medrol/ Steroid dose pack. *only to use when in boot! To begin today or tomorrow. NOT to take with concurrent anti-inflammatories.  - Rx Mobic  15mg  Take one PO QD x 30 days. This is to begin AFTER completion of steroid to bridge to interim appointment. NOT to take with concurrent anti-inflammatories or steroids. *She was told to stop her current medication of meloxicam , start the Medrol Dosepak, and then resume the meloxicam  post  completion of steroids.  She was instructed not to take this concurrently with the steroid.  Patient verbalized her understanding.  01/01/2024 Pt verbalized understanding of instructions. Patient has been educated about the Moderate risks associated with oral medications. . Patient educated about possible side effects possible adverse reactions and to stop medication and call office if any arise. A prescription was written today and will be medically managed.  - Recommended OTC Voltaren  1% gel/ cream. To apply daily, up to 4x/day - 2 minute application time to affected area. Should patient not get relief from this medication, will consider Pain Compound at next visit. Should no improvement be seen after employing these modalities, patient advised to escalate to next level of treatment.   *Work note was dispensed to provide accommodation for boot wearing.  01/01/2024  RTC 2 weeks to re-evaluate clinical symptoms and ability to transition into brace successfully   *Some images could not be shown.

## 2024-03-01 ENCOUNTER — Ambulatory Visit
Admission: EM | Admit: 2024-03-01 | Discharge: 2024-03-01 | Disposition: A | Discharge status: Home / Self Care | Attending: Family Medicine | Admitting: Family Medicine

## 2024-03-01 DIAGNOSIS — J069 Acute upper respiratory infection, unspecified: Secondary | ICD-10-CM

## 2024-03-01 DIAGNOSIS — H65193 Other acute nonsuppurative otitis media, bilateral: Secondary | ICD-10-CM | POA: Diagnosis not present

## 2024-03-01 MED ORDER — AZELASTINE HCL 0.1 % NA SOLN: 1.0000 | Freq: Two times a day (BID) | NASAL | 0 refills | Status: AC

## 2024-03-01 MED ORDER — PREDNISONE 20 MG PO TABS: 40.0000 mg | ORAL_TABLET | Freq: Every day | ORAL | 0 refills | Status: DC

## 2024-03-01 NOTE — Discharge Instructions (Signed)
 I suspect a viral respiratory infection to be causing your symptoms today including the fluid buildup in your ears from sinus pressure and congestion which is causing your ear pain.  To help with the symptoms, I have sent over a course of steroid for inflammation and a good nasal spray.  I also recommend taking Coricidin HBP as a safe decongestant until feeling much better and using saline sinus rinses, humidifiers.  Follow-up for worsening or unresolving symptoms.

## 2024-03-01 NOTE — ED Provider Notes (Signed)
 " [facility]    CSN: [id number] Arrival date & time: 03/01/24  9187      History   Chief Complaint No chief complaint on file.   HPI Kaitlin [name] is a 68 y.o. female.   Patient presenting today with 4-day history of nasal congestion, left eye drainage, headache, sore throat, cough, right ear pain and fullness, body aches.  Denies fever, chest pain, shortness of breath, abdominal pain, vomiting, diarrhea.  So far tried Tylenol  with minimal relief.  No known history of chronic pulmonary disease.    Past Medical History:  Diagnosis Date   Anxiety    Colon polyp    April 2008   Depression    Diabetes mellitus without complication [facility])    Essential hypertension    Folliculitis 09/26/2012   Helicobacter pylori gastritis    AUG 2008 PREVPAC: nausea-->HELIDAC: GI UPSET   Hypothyroidism    Irritable bowel syndrome    Kidney stones    Mixed hyperlipidemia    Mixed stress and urge urinary incontinence 09/26/2012   Obesity    Panic attacks    Reflux    Tubulovillous adenoma 08/07/2010   Colonoscopy w/ Dr Harvey w/ 7 polyps removed-bx showed tubular adenomas.  Largest 1cm-hepatic flexure polyp->TA.      Patient Active Problem List   Diagnosis Date Noted   OAB (overactive bladder) 09/16/2023   Hypercalcemia 01/10/2021   Anxiety 01/10/2021   Musculoskeletal pain 01/10/2021   Plantar fasciitis 01/10/2021   Mixed hyperlipidemia 09/25/2020   Major depression, recurrent, chronic 09/30/2019   Osteoarthritis of knee 04/07/2018   Loss of weight 05/27/2017   PTSD (post-traumatic stress disorder) 04/09/2017   MDD (major depressive disorder), recurrent episode, moderate (HCC) 04/09/2017   Diabetes mellitus type 2, diet-controlled (HCC) 12/20/2016   Chondromalacia of left knee 12/11/2016   GERD (gastroesophageal reflux disease) 02/20/2016   Hiatal hernia    Mixed stress and urge urinary incontinence 09/26/2012   Subserous leiomyoma of uterus 09/01/2012    Hypothyroidism 06/12/2012   Venous stasis 06/12/2012   Essential hypertension, benign 12/11/2010   IBS (irritable bowel syndrome) 07/05/2010   Hx of adenomatous colonic polyps 07/05/2010    Past Surgical History:  Procedure Laterality Date   CHOLECYSTECTOMY     COLONOSCOPY  APR 2008   AC/[facility] SIMPLE ADENOMA   COLONOSCOPY  08/07/2010   Dr. Parks adenomas and tubulovillous adenoma; needs surveillance 2015   COLONOSCOPY N/A 09/18/2013   Dr.Rourk- normal rectum, 2 diminutive polys in the mid sigmoid segment o/w the remainder of the colonic mucosa appeared normal.hyperplastic poylps   COLONOSCOPY WITH PROPOFOL  N/A 02/23/2019   Procedure: COLONOSCOPY WITH PROPOFOL ;  Surgeon: Shaaron Lamar HERO, MD;  [facility]: [facility];  Service: Endoscopy;  Laterality: N/A;  2:45pm - office unable to reach pt to move her up   COLONOSCOPY WITH PROPOFOL  N/A 05/16/2023   Procedure: COLONOSCOPY WITH PROPOFOL ;  Surgeon: Shaaron Lamar HERO, MD;  Location: [facility];  Service: Endoscopy;  Laterality: N/A;  9:45 am, asa 2   ESOPHAGOGASTRODUODENOSCOPY N/A 11/29/2014   RMR: Gastric erosions. Small hiatal hernia. Status post biopsy.    MASS EXCISION  02/28/2011   Procedure: EXCISION MASS;  Surgeon: Thresa JAYSON Pulling, MD;  Location: [facility];  Service: General;  Laterality: Right;  soft tissue mass   POLYPECTOMY  02/23/2019   Procedure: POLYPECTOMY;  Surgeon: Shaaron Lamar HERO, MD;  Location: [facility];  Service: Endoscopy;;   POLYPECTOMY  05/16/2023   Procedure: POLYPECTOMY;  Surgeon: Shaaron Lamar HERO, MD;  Location: [facility];  Service: Endoscopy;;   UPPER GASTROINTESTINAL ENDOSCOPY  AUG 2008 PAIN/NAUSEA   H. PYLORI treated    OB History     Gravida  1   Para  1   Term      Preterm      AB      Living  1      SAB      IAB      Ectopic      Multiple      Live Births  1            Home Medications    Prior to Admission medications  Medication Sig Start Date End Date Taking?  Authorizing Provider  azelastine  (ASTELIN ) 0.1 % nasal spray Place 1 spray into both nostrils 2 (two) times daily. Use in each nostril as directed 03/01/24  Yes Stuart Vernell Norris, PA-C  predniSONE  (DELTASONE ) 20 MG tablet Take 2 tablets (40 mg total) by mouth daily with breakfast. 03/01/24  Yes Stuart Vernell Norris, PA-C  aspirin EC 81 MG tablet Take 81 mg by mouth daily.    [provider]  blood glucose meter kit and supplies Dispense based on patient and insurance preference. Test once a day E11.9 07/04/15   Alphonsa Elsie RAMAN, MD  busPIRone (BUSPAR) 5 MG tablet Take 1 tablet by mouth 2 (two) times daily. Patient taking differently: Take 1 tablet by mouth 2 (two) times daily as needed. 12/19/22   [provider]  Cholecalciferol (D 1000) 25 MCG (1000 UT) capsule Take 1,000 Units by mouth daily. Patient taking differently: Take 1,000 Units by mouth once a week. 09/07/16   [provider]  clonazePAM  (KLONOPIN ) 1 MG tablet Take 1 tablet (1 mg total) by mouth as needed. 10/13/21   Cook, Jayce G, DO  enalapril  (VASOTEC ) 20 MG tablet Take 1 tablet (20 mg total) by mouth daily. 08/16/20   Waddell Catholic M, DO  escitalopram  (LEXAPRO ) 10 MG tablet Take 1 tablet (10 mg total) by mouth daily. 01/10/21   Cook, Jayce G, DO  glucose blood (CONTOUR NEXT TEST) test strip TEST ONCE DAILY AS DIRECTED 08/14/21   Cook, Jayce G, DO  levothyroxine  (SYNTHROID ) 50 MCG tablet Take 1 tablet (50 mcg total) by mouth daily. 09/09/20   Waddell Catholic HERO, DO  metformin (FORTAMET) 500 MG (OSM) 24 hr tablet Take 500 mg by mouth daily with breakfast. Patient taking differently: Take 500 mg by mouth 2 (two) times daily with a meal.    [provider]  mirabegron  ER (MYRBETRIQ ) 50 MG TB24 tablet Take 1 tablet (50 mg total) by mouth daily. At bedtime Patient taking differently: Take 50 mg by mouth as needed. At bedtime 09/16/23   Jayne Vonn DEL, MD  pantoprazole  (PROTONIX ) 40 MG tablet Take 1 tablet  (40 mg total) by mouth 2 (two) times daily before a meal. Patient taking differently: Take 40 mg by mouth 2 (two) times daily as needed. 12/11/18   Rudy Josette RAMAN, PA-C  promethazine -dextromethorphan (PROMETHAZINE -DM) 6.25-15 MG/5ML syrup Take 5 mLs by mouth 4 (four) times daily as needed for cough. 11/27/23   Towana Joen HERO, PA-C  rosuvastatin  (CRESTOR ) 10 MG tablet Take 1 tablet (10 mg total) by mouth daily. 01/17/21   Cook, Jayce G, DO  verapamil  (CALAN -SR) 240 MG CR tablet TAKE 1 TABLET (240 MG TOTAL) BY MOUTH EVERY MORNING. IN THE MORNING 07/20/22   Cook, Jayce G, DO  Family History Family History  Problem Relation Age of Onset   Coronary artery disease Mother    Hypertension Mother    Heart attack Mother    Heart disease Mother    Suicidality Mother    Diabetes Sister    Diabetes Sister    Coronary artery disease Brother        Age 30   Heart attack Brother    Heart disease Brother    Colon cancer Neg Hx    Anesthesia problems Neg Hx    Hypotension Neg Hx    Malignant hyperthermia Neg Hx    Pseudochol deficiency Neg Hx    Colon polyps Neg Hx     Social History Social History[1]   Allergies   Augmentin [amoxicillin -pot clavulanate], Ipratropium, Levaquin [levofloxacin in d5w], Rocephin  [ceftriaxone  sodium in dextrose ], Sertraline, Valtrex [valacyclovir hcl], and Zoloft [sertraline hcl]   Review of Systems Review of Systems Per HPI  Physical Exam Triage Vital Signs ED Triage Vitals  Encounter Vitals Group     BP 03/01/24 0851 (!) 146/76     Girls Systolic BP Percentile --      Girls Diastolic BP Percentile --      Boys Systolic BP Percentile --      Boys Diastolic BP Percentile --      Pulse Rate 03/01/24 0851 87     Resp 03/01/24 0851 20     Temp 03/01/24 0851 98.4 F (36.9 C)     Temp Source 03/01/24 0851 Oral     SpO2 03/01/24 0851 92 %     Weight --      Height --      Head Circumference --      Peak Flow --      Pain Score 03/01/24 0855 9      Pain Loc --      Pain Education --      Exclude from Growth Chart --    No data found.  Updated Vital Signs BP (!) 146/76 (BP Location: Right Arm)   Pulse 87   Temp 98.4 F (36.9 C) (Oral)   Resp 20   SpO2 92%   Visual Acuity Right Eye Distance:   Left Eye Distance:   Bilateral Distance:    Right Eye Near:   Left Eye Near:    Bilateral Near:     Physical Exam Vitals and nursing note reviewed.  Constitutional:      Appearance: Normal appearance.  HENT:     Head: Atraumatic.     Right Ear: External ear normal.     Left Ear: External ear normal.     Ears:     Comments: Bilateral middle ear effusion, worse on the right    Nose: Rhinorrhea present.     Mouth/Throat:     Mouth: Mucous membranes are moist.     Pharynx: Posterior oropharyngeal erythema present.  Eyes:     Extraocular Movements: Extraocular movements intact.     Conjunctiva/sclera: Conjunctivae normal.  Cardiovascular:     Rate and Rhythm: Normal rate and regular rhythm.     Heart sounds: Normal heart sounds.  Pulmonary:     Effort: Pulmonary effort is normal.     Breath sounds: Normal breath sounds. No wheezing.  Musculoskeletal:        General: Normal range of motion.     Cervical back: Normal range of motion and neck supple.  Skin:    General: Skin is warm and dry.  Neurological:  Mental Status: She is alert and oriented to person, place, and time.  Psychiatric:        Mood and Affect: Mood normal.        Thought Content: Thought content normal.      UC Treatments / Results  Labs (all labs ordered are listed, but only abnormal results are displayed) Labs Reviewed - No data to display  EKG   Radiology No results found.  Procedures Procedures (including critical care time)  Medications Ordered in UC Medications - No data to display  Initial Impression / Assessment and Plan / UC Course  I have reviewed the triage vital signs and the nursing notes.  Pertinent labs & imaging  results that were available during my care of the patient were reviewed by me and considered in my medical decision making (see chart for details).     Suspect viral respiratory infection causing a middle ear effusion.  Discussed the diagnosis and pathophysiology at length with patient today as well as treatment plan and the mode of action of these medications.  Also gave written instructions about all of this in the patient after visit summary today.  Will treat with prednisone , Astelin , Coricidin HBP, saline sinus rinses, humidifiers and other supportive measures.  Return for worsening or unresolving symptoms.  Final Clinical Impressions(s) / UC Diagnoses   Final diagnoses:  Viral URI with cough  Acute MEE (middle ear effusion), bilateral     Discharge Instructions      I suspect a viral respiratory infection to be causing your symptoms today including the fluid buildup in your ears from sinus pressure and congestion which is causing your ear pain.  To help with the symptoms, I have sent over a course of steroid for inflammation and a good nasal spray.  I also recommend taking Coricidin HBP as a safe decongestant until feeling much better and using saline sinus rinses, humidifiers.  Follow-up for worsening or unresolving symptoms.   ED Prescriptions     Medication Sig Dispense Auth. Provider   predniSONE  (DELTASONE ) 20 MG tablet Take 2 tablets (40 mg total) by mouth daily with breakfast. 10 tablet Stuart Vernell Norris, PA-C   azelastine  (ASTELIN ) 0.1 % nasal spray Place 1 spray into both nostrils 2 (two) times daily. Use in each nostril as directed 30 mL Stuart Vernell Norris, PA-C      PDMP not reviewed this encounter.    [1]  Social History Tobacco Use   Smoking status: Never   Smokeless tobacco: Never   Tobacco comments:    Never Smoked  Vaping Use   Vaping status: Never Used  Substance Use Topics   Alcohol use: Not Currently   Drug use: No     Stuart Vernell Norris, PA-C 03/01/24 1042  "

## 2024-03-01 NOTE — ED Triage Notes (Signed)
 Pt reports right ear fullness, swelling in the left eye with drainage, headache, sore throat, cough and congestion, body aches  x 4 days denies fever.

## 2024-03-09 ENCOUNTER — Other Ambulatory Visit: Payer: Self-pay

## 2024-03-09 ENCOUNTER — Ambulatory Visit: Payer: Self-pay | Admitting: Medical

## 2024-03-09 ENCOUNTER — Encounter: Payer: Self-pay | Admitting: Medical

## 2024-03-09 VITALS — BP 147/69 | HR 82 | Temp 97.8°F | Ht 65.0 in | Wt 193.0 lb

## 2024-03-09 DIAGNOSIS — H6690 Otitis media, unspecified, unspecified ear: Secondary | ICD-10-CM

## 2024-03-09 DIAGNOSIS — H6993 Unspecified Eustachian tube disorder, bilateral: Secondary | ICD-10-CM

## 2024-03-09 MED ORDER — FLUTICASONE PROPIONATE 50 MCG/ACT NA SUSP: 2.0000 | Freq: Every day | NASAL | 6 refills | Status: AC

## 2024-03-09 MED ORDER — AZITHROMYCIN 250 MG PO TABS: ORAL_TABLET | ORAL | 0 refills | Status: AC

## 2024-03-09 NOTE — Progress Notes (Unsigned)
 [facility] [address] [place], [place] [address]   Office Visit Note  Patient Name: Kaitlin [name] Date of Birth [id number]  Medical Record number [id number]  Date of Service: 03/09/2024  Chief Complaint  Patient presents with   Acute Visit    Patient c/o bilat ear pain, sore throat, and states she has a buzzing sound in her R ear. She has had symptoms for over 10 days. She was seen at Urgent Care on 03/01/24 at which time she was prescribed prednisone  which she has since completed taking. She was also prescribed azelastine  nasal spray and cough syrup but she is not taking either one of them.      HPI Discussed the use of AI scribe software for clinical note transcription with the patient, who gave verbal consent to proceed.  History of Present Illness Kaitlin [name] is a 69 year old female who presents with persistent ear pain and throat discomfort.  Otalgia and tinnitus - Persistent ear discomfort, right more than left, ongoing since approximately 12/24 - Frequent 'whistling noise' or rushing in right ear - Previously diagnosed with middle ear effusions at urgent care on 03/01/24 - No improvement in symptoms with five-day course of prednisone  (40 mg every day x 5 days) - Tylenol  1000 mg provides partial pain relief - No use of ibuprofen  due to lack of availability - History of recurrent ear and sinus issues, with similar episode in September; Patient states she has an upcoming appointment with ENT in ~3 weeks  Nasal congestion and rhinorrhea - Initial significant nasal congestion with yellow discharge and crusting, now improved - Current nasal discharge is clear - Saline nasal irrigation has reduced congestion - Astelin  nasal spray was prescribed by UC but not used  Pharyngalgia and odynophagia - Throat pain, especially with swallowing - Discomfort attributed to swollen glands in neck  Systemic and associated symptoms - No fever - No shortness  of breath  Medication allergies and prior antibiotic use - Allergies to Augmentin, Cephalosporins and Levaquin - Patient states Doxycycline  used for sinusitis in September was ineffective  Blood pressure elevation - Blood pressure measured at 147/69 at urgent care visit - Elevated blood pressure attributed to pain by patient though PMH does include HTN (no meds for this currently)   Current Medication:  Outpatient Encounter Medications as of 03/09/2024  Medication Sig   aspirin EC 81 MG tablet Take 81 mg by mouth daily.   blood glucose meter kit and supplies Dispense based on patient and insurance preference. Test once a day E11.9   busPIRone (BUSPAR) 5 MG tablet Take 1 tablet by mouth 2 (two) times daily. (Patient taking differently: Take 1 tablet by mouth 2 (two) times daily as needed.)   Cholecalciferol (D 1000) 25 MCG (1000 UT) capsule Take 1,000 Units by mouth daily. (Patient taking differently: Take 1,000 Units by mouth once a week.)   clonazePAM  (KLONOPIN ) 1 MG tablet Take 1 tablet (1 mg total) by mouth as needed.   enalapril  (VASOTEC ) 20 MG tablet Take 1 tablet (20 mg total) by mouth daily.   escitalopram  (LEXAPRO ) 10 MG tablet Take 1 tablet (10 mg total) by mouth daily.   glucose blood (CONTOUR NEXT TEST) test strip TEST ONCE DAILY AS DIRECTED   levothyroxine  (SYNTHROID ) 50 MCG tablet Take 1 tablet (50 mcg total) by mouth daily.   metformin (FORTAMET) 500 MG (OSM) 24 hr tablet Take 500 mg by mouth daily with breakfast. (Patient taking differently: Take 500  mg by mouth 2 (two) times daily with a meal.)   mirabegron  ER (MYRBETRIQ ) 50 MG TB24 tablet Take 1 tablet (50 mg total) by mouth daily. At bedtime (Patient taking differently: Take 50 mg by mouth as needed. At bedtime)   pantoprazole  (PROTONIX ) 40 MG tablet Take 1 tablet (40 mg total) by mouth 2 (two) times daily before a meal. (Patient taking differently: Take 40 mg by mouth 2 (two) times daily as needed.)   rosuvastatin   (CRESTOR ) 10 MG tablet Take 1 tablet (10 mg total) by mouth daily.   verapamil  (CALAN -SR) 240 MG CR tablet TAKE 1 TABLET (240 MG TOTAL) BY MOUTH EVERY MORNING. IN THE MORNING   azelastine  (ASTELIN ) 0.1 % nasal spray Place 1 spray into both nostrils 2 (two) times daily. Use in each nostril as directed (Patient not taking: Reported on 03/09/2024)   promethazine -dextromethorphan (PROMETHAZINE -DM) 6.25-15 MG/5ML syrup Take 5 mLs by mouth 4 (four) times daily as needed for cough. (Patient not taking: Reported on 03/09/2024)   [DISCONTINUED] predniSONE  (DELTASONE ) 20 MG tablet Take 2 tablets (40 mg total) by mouth daily with breakfast.   No facility-administered encounter medications on file as of 03/09/2024.      Medical History: Past Medical History:  Diagnosis Date   Anxiety    Colon polyp    April 2008   Depression    Diabetes mellitus without complication (HCC)    Essential hypertension    Folliculitis 09/26/2012   Helicobacter pylori gastritis    AUG 2008 PREVPAC: nausea-->HELIDAC: GI UPSET   Hypothyroidism    Irritable bowel syndrome    Kidney stones    Mixed hyperlipidemia    Mixed stress and urge urinary incontinence 09/26/2012   Obesity    Panic attacks    Reflux    Tubulovillous adenoma 08/07/2010   Colonoscopy w/ Dr Harvey w/ 7 polyps removed-bx showed tubular adenomas.  Largest 1cm-hepatic flexure polyp->TA.       Vital Signs: BP (!) 147/69   Pulse 82   Temp 97.8 F (36.6 C)   Ht 5' 5 (1.651 m)   Wt 193 lb (87.5 kg)   SpO2 97%   BMI 32.12 kg/m    Review of Systems See HPI  Physical Exam Vitals reviewed.  Constitutional:      General: She is not in acute distress.    Appearance: She is not ill-appearing.  HENT:     Head: Normocephalic.     Right Ear: Ear canal and external ear normal. A middle ear effusion (serous) is present. Tympanic membrane is injected and erythematous.     Left Ear: Ear canal and external ear normal. A middle ear effusion (large  serous) is present. Tympanic membrane is injected. Tympanic membrane is not erythematous.     Ears:     Comments: Small dry cerumen to right EAC partially obscures view or right TM. Able to visualize significant injection/erythema to central aspect of right TM with cloudy middle ear fluid.    Nose: Mucosal edema present. No congestion or rhinorrhea.     Right Turbinates: Swollen.     Left Turbinates: Swollen.     Comments: Slight frontal/maxillary sinus tenderness. Nasal turbinates pale/bluish.    Mouth/Throat:     Mouth: Mucous membranes are moist. No oral lesions.     Pharynx: No pharyngeal swelling or posterior oropharyngeal erythema.     Tonsils: No tonsillar exudate. 0 on the right. 0 on the left.  Cardiovascular:     Rate and Rhythm: Normal  rate and regular rhythm.     Heart sounds: No murmur heard.    No friction rub. No gallop.  Pulmonary:     Effort: Pulmonary effort is normal.     Breath sounds: Normal breath sounds. No wheezing, rhonchi or rales.  Musculoskeletal:     Cervical back: Neck supple. No rigidity.  Lymphadenopathy:     Cervical: Cervical adenopathy (small anterior nodes, mildly tender) present.  Neurological:     Mental Status: She is alert.      Assessment/Plan: 1. Acute otitis media, unspecified otitis media type (Primary)  - azithromycin  (ZITHROMAX ) 250 MG tablet; Take 2 tablets on day 1, then 1 tablet daily on days 2 through 5  Dispense: 6 tablet; Refill: 0  2. Dysfunction of both eustachian tubes  - fluticasone  (FLONASE ) 50 MCG/ACT nasal spray; Place 2 sprays into both nostrils daily.  Dispense: 16 g; Refill: 6  Assessment & Plan Acute otitis media with eustachian tube dysfunction Persistent right ear pain with fluid accumulation behind the eardrum. Recent course of prednisone  ineffective. Patient afebrile. Significant middle ear fluid with some injection/mild erythema of TMs (R>L) on exam. Question middle ear infection versus persistent discomfort  secondary to fluid build-up (acute versus chronic). ENT consultation pending. - Prescribed azithromycin  for potential bacterial infection. May not be ideal for OM, but options limited given allergies. - Prescribed Fluticasone  nasal spray, two sprays in each nostril once daily. Advised to continue this until she sees ENT. - Recommended over-the-counter ear drops for pain relief. - Advised use of occasional ibuprofen  for pain and inflammation. - Will avoid decongestants given recent elevated blood pressure. - Keep upcoming appointment with ENT for further evaluation. - Call or schedule follow up visit in meantime with new/worsening symptoms.    General Counseling: shikha bibb understanding of the findings of todays visit and plan of treatment. she has been encouraged to call the office with any questions or concerns that should arise related to todays visit.    Joen Arts PA-C Physician Assistant

## 2024-03-12 ENCOUNTER — Encounter: Payer: Self-pay | Admitting: Medical

## 2024-03-15 NOTE — Patient Instructions (Addendum)
-  Take complete course of antibiotics as prescribed.  Take with food.   -Use Flonase /Fluticasone  nasal spray, 2 sprays to each nostril once a day. Continue this until your see the ear specialist. -You may try the over-the-counter ear drops we discussed as needed for ear pain. -You may take Ibuprofen  400 mg every 6-8 hours with food or Tylenol /Acetaminophen  1000 mg every 6 hours as needed for pain in your ears, throat or neck. Try to limit use of Ibuprofen  since it can raise your blood pressure. -Keep upcoming appointment with ENT for further evaluation. -Call, send MyChart message to provider or schedule return visit as needed for new/worsening symptoms (i.e. fever, increased pain) or if symptoms do not improve as discussed with prescribed/recommended treatment.
